# Patient Record
Sex: Male | Born: 1950 | Race: Black or African American | Hispanic: No | Marital: Single | Smoking: Former smoker
Health system: Southern US, Community
[De-identification: ages and names within clinical notes are randomized; demographics above are authoritative.]

## PROBLEM LIST (undated history)

## (undated) DIAGNOSIS — I2510 Atherosclerotic heart disease of native coronary artery without angina pectoris: Secondary | ICD-10-CM

## (undated) DIAGNOSIS — C801 Malignant (primary) neoplasm, unspecified: Secondary | ICD-10-CM

## (undated) DIAGNOSIS — I714 Abdominal aortic aneurysm, without rupture, unspecified: Secondary | ICD-10-CM

## (undated) DIAGNOSIS — I350 Nonrheumatic aortic (valve) stenosis: Secondary | ICD-10-CM

## (undated) DIAGNOSIS — Z89619 Acquired absence of unspecified leg above knee: Secondary | ICD-10-CM

## (undated) DIAGNOSIS — M1990 Unspecified osteoarthritis, unspecified site: Secondary | ICD-10-CM

## (undated) DIAGNOSIS — I219 Acute myocardial infarction, unspecified: Secondary | ICD-10-CM

## (undated) DIAGNOSIS — I10 Essential (primary) hypertension: Secondary | ICD-10-CM

## (undated) DIAGNOSIS — E785 Hyperlipidemia, unspecified: Secondary | ICD-10-CM

## (undated) DIAGNOSIS — R011 Cardiac murmur, unspecified: Secondary | ICD-10-CM

## (undated) HISTORY — DX: Atherosclerotic heart disease of native coronary artery without angina pectoris: I25.10

## (undated) HISTORY — PX: LEG SURGERY: SHX1003

## (undated) HISTORY — PX: COLONOSCOPY: SHX174

## (undated) HISTORY — PX: CORONARY ANGIOPLASTY WITH STENT PLACEMENT: SHX49

## (undated) HISTORY — DX: Hyperlipidemia, unspecified: E78.5

---

## 1970-01-17 DIAGNOSIS — Z89619 Acquired absence of unspecified leg above knee: Secondary | ICD-10-CM

## 1970-01-17 HISTORY — PX: ABOVE KNEE LEG AMPUTATION: SUR20

## 1970-01-17 HISTORY — DX: Acquired absence of unspecified leg above knee: Z89.619

## 2003-01-13 ENCOUNTER — Emergency Department (HOSPITAL_COMMUNITY): Admission: EM | Admit: 2003-01-13 | Discharge: 2003-01-13 | Payer: Self-pay | Admitting: Emergency Medicine

## 2003-01-18 HISTORY — PX: COLONOSCOPY: SHX174

## 2003-04-29 ENCOUNTER — Ambulatory Visit (HOSPITAL_COMMUNITY): Admission: RE | Admit: 2003-04-29 | Discharge: 2003-04-29 | Payer: Self-pay | Admitting: Internal Medicine

## 2005-07-16 ENCOUNTER — Emergency Department (HOSPITAL_COMMUNITY): Admission: EM | Admit: 2005-07-16 | Discharge: 2005-07-16 | Payer: Self-pay | Admitting: Emergency Medicine

## 2005-07-28 ENCOUNTER — Inpatient Hospital Stay (HOSPITAL_COMMUNITY): Admission: EM | Admit: 2005-07-28 | Discharge: 2005-07-29 | Payer: Self-pay | Admitting: Emergency Medicine

## 2005-07-29 ENCOUNTER — Inpatient Hospital Stay (HOSPITAL_COMMUNITY): Admission: EM | Admit: 2005-07-29 | Discharge: 2005-07-31 | Payer: Self-pay | Admitting: Emergency Medicine

## 2006-02-16 ENCOUNTER — Emergency Department (HOSPITAL_COMMUNITY): Admission: EM | Admit: 2006-02-16 | Discharge: 2006-02-16 | Payer: Self-pay | Admitting: Emergency Medicine

## 2006-04-29 ENCOUNTER — Inpatient Hospital Stay (HOSPITAL_COMMUNITY): Admission: AD | Admit: 2006-04-29 | Discharge: 2006-05-02 | Payer: Self-pay | Admitting: Cardiology

## 2006-04-29 ENCOUNTER — Ambulatory Visit: Payer: Self-pay | Admitting: Cardiology

## 2006-04-29 ENCOUNTER — Encounter: Payer: Self-pay | Admitting: Emergency Medicine

## 2006-04-30 ENCOUNTER — Encounter: Payer: Self-pay | Admitting: Cardiology

## 2006-05-23 ENCOUNTER — Ambulatory Visit: Payer: Self-pay | Admitting: Cardiovascular Disease

## 2006-05-25 ENCOUNTER — Encounter (HOSPITAL_COMMUNITY): Admission: RE | Admit: 2006-05-25 | Discharge: 2006-06-24 | Payer: Self-pay | Admitting: Cardiovascular Disease

## 2006-06-26 ENCOUNTER — Encounter (HOSPITAL_COMMUNITY): Admission: RE | Admit: 2006-06-26 | Discharge: 2006-07-26 | Payer: Self-pay | Admitting: Cardiovascular Disease

## 2006-07-18 ENCOUNTER — Ambulatory Visit: Payer: Self-pay | Admitting: Cardiovascular Disease

## 2006-07-28 ENCOUNTER — Encounter (HOSPITAL_COMMUNITY): Admission: RE | Admit: 2006-07-28 | Discharge: 2006-08-27 | Payer: Self-pay | Admitting: Cardiovascular Disease

## 2006-08-28 ENCOUNTER — Encounter (HOSPITAL_COMMUNITY): Admission: RE | Admit: 2006-08-28 | Discharge: 2006-09-27 | Payer: Self-pay | Admitting: Cardiovascular Disease

## 2006-11-28 ENCOUNTER — Emergency Department (HOSPITAL_COMMUNITY): Admission: EM | Admit: 2006-11-28 | Discharge: 2006-11-28 | Payer: Self-pay | Admitting: Emergency Medicine

## 2007-07-27 ENCOUNTER — Emergency Department (HOSPITAL_COMMUNITY): Admission: EM | Admit: 2007-07-27 | Discharge: 2007-07-27 | Payer: Self-pay | Admitting: Emergency Medicine

## 2008-04-29 ENCOUNTER — Encounter (INDEPENDENT_AMBULATORY_CARE_PROVIDER_SITE_OTHER): Payer: Self-pay | Admitting: *Deleted

## 2008-08-11 ENCOUNTER — Encounter: Payer: Self-pay | Admitting: Cardiovascular Disease

## 2008-09-30 ENCOUNTER — Encounter: Payer: Self-pay | Admitting: Cardiology

## 2008-10-17 ENCOUNTER — Encounter: Payer: Self-pay | Admitting: Cardiovascular Disease

## 2008-10-21 DIAGNOSIS — E119 Type 2 diabetes mellitus without complications: Secondary | ICD-10-CM | POA: Insufficient documentation

## 2008-10-24 ENCOUNTER — Ambulatory Visit: Payer: Self-pay | Admitting: Cardiovascular Disease

## 2008-10-24 ENCOUNTER — Encounter (INDEPENDENT_AMBULATORY_CARE_PROVIDER_SITE_OTHER): Payer: Self-pay | Admitting: *Deleted

## 2008-10-24 DIAGNOSIS — E785 Hyperlipidemia, unspecified: Secondary | ICD-10-CM | POA: Insufficient documentation

## 2008-10-24 DIAGNOSIS — E782 Mixed hyperlipidemia: Secondary | ICD-10-CM | POA: Insufficient documentation

## 2008-10-24 DIAGNOSIS — I2510 Atherosclerotic heart disease of native coronary artery without angina pectoris: Secondary | ICD-10-CM | POA: Insufficient documentation

## 2008-10-24 DIAGNOSIS — R0989 Other specified symptoms and signs involving the circulatory and respiratory systems: Secondary | ICD-10-CM | POA: Insufficient documentation

## 2008-11-25 ENCOUNTER — Ambulatory Visit: Payer: Self-pay | Admitting: Cardiology

## 2008-11-25 ENCOUNTER — Encounter (HOSPITAL_COMMUNITY): Admission: RE | Admit: 2008-11-25 | Discharge: 2008-12-25 | Payer: Self-pay | Admitting: Cardiovascular Disease

## 2008-11-26 ENCOUNTER — Ambulatory Visit (HOSPITAL_COMMUNITY): Admission: RE | Admit: 2008-11-26 | Discharge: 2008-11-26 | Payer: Self-pay | Admitting: Cardiovascular Disease

## 2009-02-03 ENCOUNTER — Encounter (INDEPENDENT_AMBULATORY_CARE_PROVIDER_SITE_OTHER): Payer: Self-pay | Admitting: *Deleted

## 2009-02-03 LAB — CONVERTED CEMR LAB
ALT: 13 units/L
AST: 15 units/L
Bacteria, UA: NONE SEEN
CO2: 21 meq/L
Calcium: 9 mg/dL
Casts: NEGATIVE /lpf
Chloride: 107 meq/L
Glucose, Bld: 95 mg/dL
MCV: 90.2 fL
Nitrite: NEGATIVE
Protein, ur: NEGATIVE mg/dL
RBC / HPF: NONE SEEN
Sodium: 141 meq/L
Total Protein: 6.8 g/dL
Urine Glucose: NEGATIVE mg/dL
WBC number, urine, microscopy: NEGATIVE /hpf
WBC, UA: NONE SEEN cells/hpf
pH: 6

## 2009-11-02 ENCOUNTER — Ambulatory Visit: Payer: Self-pay | Admitting: Cardiology

## 2009-11-03 ENCOUNTER — Encounter: Payer: Self-pay | Admitting: Adult Health

## 2010-02-16 NOTE — Assessment & Plan Note (Signed)
Summary: F1Y      Allergies Added: NKDA  Visit Type:  Follow-up Primary [name]:  Forest Becker  CC:  no cardiology complaints.  History of Present Illness: Mr. [name] is a pleasant 60 y/o AAM we are seeing on annual follow-up with known history of CAD with NSTEMI in 2008.  Subsequent cardiac catherization revealed 80% mid Cx stenosis and diffuse non-obstructive disease of the RCA.  He had PCI of the Cx artery using a BMS at that time. He has had a subsequent myoview stress test since that time for recurrent chest discomfort which revealed no significant stress induced abnormalities, no convincing evidence of ischemia or infarction.  Also on last vist with Dr. Eden Emms carotid bruits were auscultated and a carotid study was ordered.  The demonstrated minimal plaque at carotid bifurcations bilaterally with minimal associated turbulent flow in the ICA bilaterally.  Since being seen last one year ago he denies any significant chest pain. He says he has had to take NTG SL every three months at the most. Chest discomfort is associated with cutting wood, carrying heavy load or heavy exertion.  He remains active hunting and working outside.  He says he slows down and takes longer to do tasks that took his less time before his heart attack but he has learned not to overdo.  Dr. Ouida Sills is following cholesterol status and he sees him this week for follow-up appointment and lab work.  Current Medications (verified): 1)  Plavix 75 Mg Tabs (Clopidogrel Bisulfate) .... Take One Tablet By Mouth Daily 2)  Nitrolingual 0.4 Mg/spray Soln (Nitroglycerin) .... One Spray Under Tongue Every 5 Minutes As Needed For Chest Pain---May Repeat Times Three 3)  Metoprolol Tartrate 25 Mg Tabs (Metoprolol Tartrate) .... Take 1/2 Tab Two Times A Day 4)  Zocor 40 Mg Tabs (Simvastatin) .... Take 1 Tab Daily 5)  Aspir-Low 81 Mg Tbec (Aspirin) .... Take 1 Tab Daily  Allergies (verified): No Known Drug  Allergies  Comments:  Nurse/Medical Assistant: patient didn't bring list or meds reviewed previous ov med list  Review of Systems       Only with hard exertion.  All other systems have been reviewed and are negative unless stated above.   Vital Signs:  Patient profile:   60 year old male Weight:      153 pounds BMI:     22.68 Pulse rate:   69 / minute BP sitting:   118 / 61  (right arm)  Vitals Entered By: Dreama Saa, CNA (November 02, 2009 2:29 PM)  Physical Exam  General:  Well developed, well nourished, in no acute distress. Lungs:  Clear bilaterally to auscultation and percussion. Heart:  Non-displaced PMI, chest non-tender; regular rate and rhythm, S1, S2 without murmurs, rubs or gallops. Carotid upstroke normal, no bruit. Normal abdominal aortic size, no bruits. Femorals normal pulses, no bruits. Pedals normal pulses. No edema, no varicosities. Abdomen:  Bowel sounds positive; abdomen soft and non-tender without masses, organomegaly, or hernias noted. No hepatosplenomegaly. Msk:  Back normal, normal gait. Muscle strength and tone normal. Pulses:  pulses normal in all 4 extremities Extremities:  No clubbing or cyanosis. Neurologic:  Alert and oriented x 3. Psych:  Normal affect.   EKG  Procedure date:  11/02/2009  Findings:      Normal sinus rhythm with rate of:  63 bpm  Impression & Recommendations:  Problem # 1:  CAD (ICD-414.00) He continues to be active and only takes NTG every 3 months.  He  is advised to keep a fresh bottle with him and refill every 3 months.  His angina equivalent is severe nausea and chest pressure.  He has not had any symptoms of that.  Most recent stress test is reassuring.  He will follow-up in one year. If he has problems or recurrent symptoms he will see [place] sooner.  Repeat stress test at Dr. Fabio Bering discretion on next visit. Whether to continue Plavix should be addressed on that visit.  He has a BMS but has had some chest discomfort on  and off. His updated medication list for this problem includes:    Plavix 75 Mg Tabs (Clopidogrel bisulfate) .[name]... Take one tablet by mouth daily    Nitrolingual 0.4 Mg/spray Soln (Nitroglycerin) ..... One spray under tongue every 5 minutes as needed for chest pain---may repeat times three    Metoprolol Tartrate 25 Mg Tabs (Metoprolol tartrate) .[name]... Take 1/2 tab two times a day    Aspir-low 81 Mg Tbec (Aspirin) .[name]... Take 1 tab daily  Problem # 2:  DYSLIPIDEMIA (ICD-272.4) He is followed by Dr. Ouida Sills for this. Office visit is pending and labs are to be drawn.  Recommend LDL 70, TC 200 or less. His updated medication list for this problem includes:    Zocor 40 Mg Tabs (Simvastatin) .[name]... Take 1 tab daily  Patient Instructions: 1)  Your physician recommends that you schedule a follow-up appointment in: 1 year 2)  Your physician recommends that you continue on your current medications as directed. Please refer to the Current Medication list given to you today.

## 2010-02-16 NOTE — Miscellaneous (Signed)
Summary: cbc,cmp,lipids,ua,hgba1c  Clinical Lists Changes  Observations: Added new observation of BACTERIA URN: none seen (02/03/2009 12:50) Added new observation of RBCS MICRO U: none seen (02/03/2009 12:50) Added new observation of WBCS MICRO U: none seen (02/03/2009 12:50) Added new observation of CASTS URINE: neg (02/03/2009 12:50) Added new observation of WBC UR: neg (02/03/2009 12:50) Added new observation of NITRITE URN: neg (02/03/2009 12:50) Added new observation of PROTEIN UR: neg (02/03/2009 12:50) Added new observation of BLOOD UR: neg (02/03/2009 12:50) Added new observation of GLUCOSE UA: neg (02/03/2009 12:50) Added new observation of PH URINE: 6.0  (02/03/2009 12:50) Added new observation of SPEC GR URIN: 1.009  (02/03/2009 12:50) Added new observation of CALCIUM: 9.0 mg/dL (64/40/3474 25:95) Added new observation of ALBUMIN: 4.2 g/dL (63/87/5643 32:95) Added new observation of PROTEIN, TOT: 6.8 g/dL (18/84/1660 63:01) Added new observation of SGPT (ALT): 13 units/L (02/03/2009 12:50) Added new observation of SGOT (AST): 15 units/L (02/03/2009 12:50) Added new observation of ALK PHOS: 77 units/L (02/03/2009 12:50) Added new observation of BILI DIRECT: total bili  0.2 mg/dL (60/10/9321 55:73) Added new observation of CREATININE: 0.90 mg/dL (22/02/5425 06:23) Added new observation of BUN: 17 mg/dL (76/28/3151 76:16) Added new observation of BG RANDOM: 95 mg/dL (07/37/1062 69:48) Added new observation of CO2 PLSM/SER: 21 meq/L (02/03/2009 12:50) Added new observation of CL SERUM: 107 meq/L (02/03/2009 12:50) Added new observation of K SERUM: 4.3 meq/L (02/03/2009 12:50) Added new observation of NA: 141 meq/L (02/03/2009 12:50) Added new observation of LDL: 67 mg/dL (54/62/7035 00:93) Added new observation of HDL: 32 mg/dL (81/82/9937 16:96) Added new observation of TRIGLYC TOT: 88 mg/dL (78/93/8101 75:10) Added new observation of CHOLESTEROL: 117 mg/dL (25/85/2778  24:23) Added new observation of PLATELETK/UL: 319 K/uL (02/03/2009 12:50) Added new observation of MCV: 90.2 fL (02/03/2009 12:50) Added new observation of HCT: 44.1 % (02/03/2009 12:50) Added new observation of HGB: 15.0 g/dL (53/61/4431 54:00) Added new observation of WBC COUNT: 12.2 10*3/microliter (02/03/2009 12:50) Added new observation of HGBA1C: 6.0 % (02/03/2009 12:50)

## 2010-03-15 ENCOUNTER — Encounter: Payer: Self-pay | Admitting: Cardiovascular Disease
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
## 2010-06-04 NOTE — Op Note (Signed)
NAME:  [name], [name]                        ACCOUNT NO.:  [id number]   MEDICAL RECORD NO.:  [id number]                   PATIENT TYPE:  AMB   LOCATION:  DAY                                  FACILITY:  [facility]   PHYSICIAN:  R. Roetta Sessions, M.D.              DATE OF BIRTH:  1950-10-06   DATE OF PROCEDURE:  04/29/2003  DATE OF DISCHARGE:                                 OPERATIVE REPORT   PROCEDURE:  Colonoscopy with snare polypectomy.   INDICATIONS:  The patient is a 60 year old gentleman sent over at the  courtesy of Dr. Carylon Perches for colorectal cancer screening.  He is devoid of  any lower GI tract symptoms.  He has never had his colon imaged previously  and there is no family history of colorectal neoplasia.  Colonoscopy is now  being done as a standard screening maneuver.  This approach has been  discussed with the patient at length.  The potential risks, benefits and  alternatives have been reviewed and questions answered.  Please see  documentation in the medical record.   DESCRIPTION OF PROCEDURE:  Oxygen saturation, blood pressure, pulse and  respiration were monitored throughout the entire procedure.  Conscious  sedation with Versed 4 mg IV and Demerol 75 mg IV in divided doses.  The  instrument was the Olympus video tip adult colonoscope.   FINDINGS:  Digital rectal exam initially revealed no abnormalities.   ENDOSCOPIC FINDINGS:  Prep was adequate.  Rectum:  Examination of the rectal mucosa including retroflexion in the anal  verge revealed no abnormalities.  Colon:  Colonic mucosa was surveyed from the rectosigmoid junction to the  left, transverse,  right colon to the area of the appendiceal orifice,  ileocecal valve and cecum.  These structures were well seen and photographed  for the record.  From this level the scope was slowly withdrawn.  All  previously mentioned mucosal surfaces were again seen.  The patient was  noted to have a 5 mm pedunculated polyp at 20 cm  (rectosigmoid) which was  cold snared and recovered, scattered left-sided diverticula.  The remainder  of the colonic mucosa appeared normal.  The patient tolerated the procedure  well and was reactive after endoscopy.   IMPRESSION:  1. Normal rectum.  2. Small pedunculated polyp at 20 cm, cold snare removed.  3. Scattered left-sided diverticula.  4. The remainder of the colonic mucosa appeared normal.   RECOMMENDATIONS:  1. Follow up in pathology.  2. Further recommendations to follow.      ___________________________________________                                            Jonathon Bellows, M.D.   RMR/MEDQ  D:  04/29/2003  T:  04/29/2003  Job:  [id number]   cc:  Kingsley Callander. Ouida Sills, M.D.  [address]  [place]  [place] [address]  Fax: [phone]
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
## 2010-06-04 NOTE — Discharge Summary (Signed)
NAMEADD, DINAPOLI              ACCOUNT NO.:  [id number]   MEDICAL RECORD NO.:  [id number]          PATIENT TYPE:  INP   LOCATION:  A304                          FACILITY:  [facility]   PHYSICIAN:  Kingsley Callander. Ouida Sills, MD       DATE OF BIRTH:  04-22-1950   DATE OF ADMISSION:  07/29/2005  DATE OF DISCHARGE:  07/15/2007LH                                 DISCHARGE SUMMARY   DISCHARGE DIAGNOSES:  1. Gastroenteritis.  2. Diabetes.   HOSPITAL COURSE:  This patient is a 60 year old male who was readmitted on  the same day of a discharge after he developed recurrent abdominal pain and  vomiting.  He had been admitted for one night and had been treated with IV  fluids and anti-emetics.  His symptoms had resolved.  He had been able to  eat.  However, upon returning home he developed recurrent symptoms and was  therefore rehospitalized.  His white count was 12.6 with 75 segs and 15  lymphs.  He was treated with IV fluids, Zofran, and Protonix.  Stool studies  were negative.  LFTs were normal.  His repeat white count was 14.5.  Abdominal pain was treated with morphine.   He was again able to tolerate clear liquids, then a bland diet.  His  symptoms resolved and he was stable for discharge on July 15.   He had had a CT on July 12 revealing possible thickening of the wall of the  transverse colon.  This was felt to likely represent under distension,  however.  A previous colonoscopy had revealed no tumor in this region.  The  question of Clostridium difficile colitis was raised by the radiologist;  however, his Clostridium difficile toxin was negative.   His diabetes was well-controlled with glucoses ranging from 107-150.   DISCHARGE MEDICATIONS:  1. Vytorin 10/20 daily.  2. Aspirin 81 mg daily.      Kingsley Callander. Ouida Sills, MD  Electronically Signed     ROF/MEDQ  D:  08/09/2005  T:  08/09/2005  Job:  [id number]

## 2010-06-04 NOTE — Discharge Summary (Signed)
[name], [name]              ACCOUNT NO.:  [id number]   MEDICAL RECORD NO.:  [id number]          PATIENT TYPE:  INP   LOCATION:  A304                          FACILITY:  [facility]   PHYSICIAN:  Kingsley Callander. Ouida Sills, MD       DATE OF BIRTH:  1950/03/31   DATE OF ADMISSION:  07/28/2005  DATE OF DISCHARGE:  07/13/2007LH                                 DISCHARGE SUMMARY   DISCHARGE DIAGNOSES:  1. Gastroenteritis.  2. Diabetes.   HOSPITAL COURSE:  This patient is a 60 year old male who presented to the  emergency room with nausea, vomiting, diarrhea, and abdominal pain.  He was  treated with Zofran, Dilaudid, and IV fluids.  He underwent a CT scan of the  abdomen which revealed thickening of the wall of the transverse colon.  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  His white count was 15.5 with 93 segs and 5 lymphs.   A stool for white cells was negative.  His stool culture has been  preliminarily negative.  His Clostridium difficile toxin was negative.  He  improved substantially overnight.  He was advised to full liquid diet which  he tolerated well.  He was then able to eat a soft diet without any  problems.  He felt complete relief of his symptoms the next day and was felt  to be stable for discharge.   He has had colonoscopy within the past two years.  He will have follow-up of  the transverse colon thickening as necessary.   DISCHARGE MEDICATIONS:  1. Vytorin 10/20 daily.  2. Aspirin 81 mg daily.   FOLLOW-UP:  He will be seen in my office in one to two weeks.  He was  advised to stop smoking.      Kingsley Callander. Ouida Sills, MD  Electronically Signed     ROF/MEDQ  D:  08/09/2005  T:  08/09/2005  Job:  [id number]

## 2010-06-04 NOTE — Cardiovascular Report (Signed)
NAMEATHENS, LEBEAU NO.:  [id number]   MEDICAL RECORD NO.:  [id number]          PATIENT TYPE:  INP   LOCATION:  2922                         FACILITY:  [facility]   PHYSICIAN:  Veverly Fells. Excell Seltzer, MD  DATE OF BIRTH:  12-14-1950   DATE OF PROCEDURE:  05/01/2006  DATE OF DISCHARGE:                            CARDIAC CATHETERIZATION   PROCEDURE:  Left heart catheterization, selective coronary angiography,  left ventricular angiography, stent to the left circumflex and Star  close to the right femoral artery.   INDICATIONS:  Mr. Nodal is a 60 year old gentleman  who was out  hunting over the weekend.  He developed substernal chest pain.  He has  no prior history of cardiac disease.  He ruled in for myocardial  infarction with a troponin greater than 30.  He was referred for cardiac  catheterization in the setting of his clear-cut non-ST-elevation MI.   Risks and indications of procedure were explained to the patient and  formed consent was obtained.  Using modified the Seldinger technique a 6-  French sheath was placed in the right femoral artery.  Multiple views of  the left and right coronary arteries were taken using standard Judkins  preformed catheters.  Following selective coronary angiography an angled  pigtail catheter was inserted into the left ventricle and pressures were  recorded in [name] and in LAO.  Left ventriculogram were performed.  A  pullback across the aortic valve was done.   After the diagnostic portion of the procedure, I  elected to intervene  on a high-grade stenosis in the mid left circumflex.  There was an area  of stenosis of approximately 80%.  It gave off an appearance that there  was potentially a marginal branch arising from that area that was  occluded.  Despite that there were no wall motion abnormalities seen on  the ventriculogram.  The patient was already on Integrin.  Additional  heparin of 40 units per kg were given for  anticoagulation.  A  therapeutic ACT was achieved at 258.  Once the ACT was therapeutic, a  Cougar guidewire was inserted across the lesion.  A 6-French [id number] guide  catheter was. I made multiple attempts at passing a wire into what I  thought with an occluded marginal branch but the wire would not pass.  There were no collaterals seen to that area.  I then elected to wire the  true circumflex across the area of tight stenosis.  A 2.5 x 14 mm driver  stent was deployed at 10 atmospheres.  Following stent deployment there  was an excellent angiographic result with good stent expansion and TIMI  III flow.  The stent was then postdilated with a 2.75 x 12-mm  noncompliant balloon up to 16 atmospheres on two serial inflations.  At  the conclusion of the interventional procedure there was TIMI III flow  throughout the circumflex system.  There is a well expanded stent and an  excellent angiographic result.   At the conclusion of the case, a 6-French Star close was used to seal  the femoral arteriotomy.   FINDINGS:  Aortic pressure 138/65 with a mean of 92, left ventricular  pressure 145/1 with an end-diastolic pressure of 20.   Coronary angiography.  The left mainstem has minor luminal  irregularities.  It trifurcates into the LAD, intermediate branch and  left circumflex.   The LAD is a large-caliber vessel that courses down to the left  ventricular apex.  There are minor luminal irregularities throughout the  proximal and midportions of the LAD.  There is a large first diagonal  branch present that has no significant angiographic disease.  The distal  LAD does not have any significant stenoses.   The ramus intermedius branch is a medium size vessel.  It has minor  luminal irregularities but no significant angiographic disease.   Left circumflex is a medium caliber vessel.  It gives off two small  marginal branches and then has an area of irregular 80% stenosis.  There  is an appearance  that there could be an occluded marginal branch coming  out of this area but it is not clear.  The distal vessel is fairly  small.   The right coronary artery is diffusely diseased.  There is  nonobstructive luminal irregularities in the proximal portion.  The  midportion of the right coronary artery has a focal 40% stenosis.  The  distal vessel has diffuse luminal irregularities at most of  approximately 25% stenosis at the bifurcation of the PDA and posterior  AV segment.  The PDA branch has no significant disease.  The posterior  AV segment has minor luminal irregularities and gives off two  posterolateral branches.   Left ventriculography was performed in the 30 degree [name] as well as the  30 degree LAO projections.  There are no focal wall motion abnormalities  present.  The estimated left ventricular ejection fraction 65%.  There  is no mitral regurgitation.   ASSESSMENT:  1. Single-vessel coronary artery disease with high-grade stenosis of      the mid left circumflex.  2. Nonobstructive LAD and right coronary artery stenoses.  3. Normal left ventricular systolic function.   PLAN:  As detailed above PCI of the left circumflex was performed and a  2.5x14 mm driver stent was placed.  There is an excellent angiographic  result.  The patient tolerated the procedure well.  He should continue  on aspirin and clopidogrel for a minimum of 1 month and preferably for 1  year in the setting of his some myocardial infarction.      Veverly Fells. Excell Seltzer, MD  Electronically Signed     MDC/MEDQ  D:  05/01/2006  T:  05/01/2006  Job:  [id number]   cc:   Kingsley Callander. Ouida Sills, MD

## 2010-10-14 LAB — DIFFERENTIAL
Basophils Absolute: 0.1
Eosinophils Absolute: 0.1
Eosinophils Relative: 1
Lymphocytes Relative: 20
Lymphs Abs: 2
Monocytes Absolute: 0.5
Neutrophils Relative %: 73

## 2010-10-14 LAB — CBC
HCT: 44.5
Hemoglobin: 14.6
MCHC: 32.8
MCV: 89.3
Platelets: 322
RDW: 15.6 — ABNORMAL HIGH

## 2010-10-14 LAB — POCT CARDIAC MARKERS
CKMB, poc: 1.7
CKMB, poc: 2.3
Myoglobin, poc: 76.5
Myoglobin, poc: 79
Troponin i, poc: <0.05
Troponin i, poc: <0.05

## 2010-10-14 LAB — COMPREHENSIVE METABOLIC PANEL
Alkaline Phosphatase: 64
Creatinine, Ser: 0.92
Glucose, Bld: 136 — ABNORMAL HIGH
Sodium: 139

## 2010-11-04 ENCOUNTER — Encounter: Payer: Self-pay | Admitting: Adult Health

## 2010-11-12 ENCOUNTER — Encounter: Payer: Self-pay | Admitting: Adult Health

## 2010-11-18 ENCOUNTER — Encounter: Payer: Self-pay | Admitting: Adult Health

## 2010-11-22 ENCOUNTER — Other Ambulatory Visit: Payer: Self-pay | Admitting: Cardiovascular Disease

## 2010-11-22 ENCOUNTER — Ambulatory Visit (INDEPENDENT_AMBULATORY_CARE_PROVIDER_SITE_OTHER): Payer: Medicare Other | Admitting: Adult Health

## 2010-11-22 DIAGNOSIS — I359 Nonrheumatic aortic valve disorder, unspecified: Secondary | ICD-10-CM

## 2010-11-22 DIAGNOSIS — I2510 Atherosclerotic heart disease of native coronary artery without angina pectoris: Secondary | ICD-10-CM

## 2010-11-22 DIAGNOSIS — E785 Hyperlipidemia, unspecified: Secondary | ICD-10-CM

## 2010-11-22 DIAGNOSIS — R011 Cardiac murmur, unspecified: Secondary | ICD-10-CM

## 2010-11-22 MED ORDER — CLOPIDOGREL BISULFATE 75 MG PO TABS: 75.0000 mg | ORAL_TABLET | Freq: Every day | ORAL | Status: DC

## 2010-11-22 MED ORDER — METOPROLOL TARTRATE 25 MG PO TABS: 12.5000 mg | ORAL_TABLET | Freq: Two times a day (BID) | ORAL | Status: DC

## 2010-11-22 NOTE — Assessment & Plan Note (Signed)
He is followed by Dr. Ouida Sills for cholesterol status and treatment.

## 2010-11-22 NOTE — Progress Notes (Signed)
HPI: Mr. [name] is a friendly 60 y/o patient of Dr. Eden Emms we are seeing on annual follow-up with known history of CAD.  He has a history of NSTEMI  in 2008, subsequent cardiac catheterization at that time demonstrated,  Single-vessel coronary artery disease with high-grade stenosis of  the mid left circumflex. Nonobstructive LAD and right coronary artery stenoses.Normal left ventricular systolic function.  PCI of the left circumflex was performed and a 2.5x14 mm driver stent was placed.   Since that time he has been doing well with complaints.  He continues to be medically compliant. Has used NTG rarely. He cuts wood, walks several miles with his "rabbit dogs" and has no angina.  He had carotid studies completed when seen last by Dr. Eden Emms in 2010 demonstrating minimal plaque at carotid bifurcations bilaterally with miniall associated turbulent flow in the ICA bilaterally.     No Known Allergies  Current Outpatient Prescriptions  Medication Sig Dispense Refill  . aspirin 81 MG tablet Take 81 mg by mouth daily.        . clopidogrel (PLAVIX) 75 MG tablet Take 1 tablet (75 mg total) by mouth daily.  30 tablet  6  . metoprolol tartrate (LOPRESSOR) 25 MG tablet Take 0.5 tablets (12.5 mg total) by mouth 2 (two) times daily.  60 tablet  6  . nitroGLYCERIN (NITROLINGUAL) 0.4 MG/SPRAY spray Place 1 spray under the tongue every 5 (five) minutes as needed. May repeat for up to 3 doses       . simvastatin (ZOCOR) 40 MG tablet Take 40 mg by mouth at bedtime.          Past Medical History  Diagnosis Date  . Diabetes mellitus     Past Surgical History  Procedure Date  . Leg surgery     Repair of left leg trauma    WUJ:WJXBJY of systems complete and found to be negative unless listed above PHYSICAL EXAM BP 126/65  Pulse 69  Resp 16  Ht 5\' 9"  (1.753 m)  Wt 161 lb (73.029 kg)  BMI 23.78 kg/m2  General: Well developed, well nourished, in no acute distress Head: Eyes PERRLA, No xanthomas.    Normal cephalic and atramatic  Lungs: Clear bilaterally to auscultation and percussion. Heart: HRRR S1 S2, 2/6 systolic murmur at the LSB and apex.   Pulses are 2+ & equal.            Left carotid bruit. No JVD.  Soft abdominal bruit. No femoral bruits. Abdomen: Bowel sounds are positive, abdomen soft and non-tender without masses or                  Hernia's noted. Msk:  Back normal, normal gait. Normal strength and tone for age. Extremities: No clubbing, cyanosis or edema.  DP +1 Neuro: Alert and oriented X 3. Psych:  Good affect, responds appropriately  EKG: NSR possible left atrial enlargement. Inferior T-wave abnormality.  ASSESSMENT AND PLAN

## 2010-11-22 NOTE — Assessment & Plan Note (Signed)
He is doing well symptomatically.  He remains active and has had rare use of NTG.  Will continue current medications. He will remain on Plavix until I evaluate his systolic murmur.  He has a 2/6 murmur at the LSB and apex.  Last Echo: April 2008.  Overall left ventricular systolic function was normal. Left ventricular ejection fraction was estimated , range being  55% to 65 %. There were no left ventricular regional wall  motion abnormalities. Left ventricular wall thickness was mildly increased. There was mild aortic valvular regurgitation. There was mild mitral valvular regurgitation.  I will have repeat of his echo.  He will follow-up with Dr. Eden Emms in March unless echo is significantly abnormal. He has not seen Dr. Eden Emms in person in 2 years.

## 2010-11-22 NOTE — Patient Instructions (Signed)
Your physician has requested that you have an echocardiogram. Echocardiography is a painless test that uses sound waves to create images of your heart. It provides your doctor with information about the size and shape of your heart and how well your heart's chambers and valves are working. This procedure takes approximately one hour. There are no restrictions for this procedure.  Your physician recommends that you continue on your current medications as directed. Please refer to the Current Medication list given to you today.  Your physician recommends that you schedule a follow-up appointment in: March

## 2010-11-29 ENCOUNTER — Ambulatory Visit (HOSPITAL_COMMUNITY)
Admission: RE | Admit: 2010-11-29 | Discharge: 2010-11-29 | Disposition: A | Discharge status: Home / Self Care | Payer: Medicare Other | Source: Ambulatory Visit | Attending: Adult Health | Admitting: Adult Health

## 2010-11-29 DIAGNOSIS — I359 Nonrheumatic aortic valve disorder, unspecified: Secondary | ICD-10-CM

## 2010-11-29 DIAGNOSIS — E119 Type 2 diabetes mellitus without complications: Secondary | ICD-10-CM | POA: Insufficient documentation

## 2010-11-29 DIAGNOSIS — R011 Cardiac murmur, unspecified: Secondary | ICD-10-CM | POA: Insufficient documentation

## 2010-11-29 DIAGNOSIS — E785 Hyperlipidemia, unspecified: Secondary | ICD-10-CM | POA: Insufficient documentation

## 2010-11-29 NOTE — Progress Notes (Signed)
*  PRELIMINARY RESULTS* Echocardiogram 2D Echocardiogram has been performed.  Conrad Standish 11/29/2010, 10:23 AM

## 2011-02-18 ENCOUNTER — Other Ambulatory Visit: Payer: Self-pay | Admitting: Cardiovascular Disease

## 2011-03-07 DIAGNOSIS — Z125 Encounter for screening for malignant neoplasm of prostate: Secondary | ICD-10-CM | POA: Diagnosis not present

## 2011-03-07 DIAGNOSIS — Z79899 Other long term (current) drug therapy: Secondary | ICD-10-CM | POA: Diagnosis not present

## 2011-03-07 DIAGNOSIS — E119 Type 2 diabetes mellitus without complications: Secondary | ICD-10-CM | POA: Diagnosis not present

## 2011-03-07 DIAGNOSIS — I2510 Atherosclerotic heart disease of native coronary artery without angina pectoris: Secondary | ICD-10-CM | POA: Diagnosis not present

## 2011-03-14 DIAGNOSIS — E785 Hyperlipidemia, unspecified: Secondary | ICD-10-CM | POA: Diagnosis not present

## 2011-03-14 DIAGNOSIS — E119 Type 2 diabetes mellitus without complications: Secondary | ICD-10-CM | POA: Diagnosis not present

## 2011-03-14 DIAGNOSIS — I2510 Atherosclerotic heart disease of native coronary artery without angina pectoris: Secondary | ICD-10-CM | POA: Diagnosis not present

## 2011-03-23 ENCOUNTER — Encounter: Payer: Self-pay | Admitting: Cardiovascular Disease

## 2011-03-23 ENCOUNTER — Ambulatory Visit (INDEPENDENT_AMBULATORY_CARE_PROVIDER_SITE_OTHER): Payer: Medicare Other | Admitting: Cardiovascular Disease

## 2011-03-23 DIAGNOSIS — I2510 Atherosclerotic heart disease of native coronary artery without angina pectoris: Secondary | ICD-10-CM

## 2011-03-23 DIAGNOSIS — E785 Hyperlipidemia, unspecified: Secondary | ICD-10-CM

## 2011-03-23 DIAGNOSIS — I359 Nonrheumatic aortic valve disorder, unspecified: Secondary | ICD-10-CM

## 2011-03-23 DIAGNOSIS — R011 Cardiac murmur, unspecified: Secondary | ICD-10-CM

## 2011-03-23 DIAGNOSIS — R0989 Other specified symptoms and signs involving the circulatory and respiratory systems: Secondary | ICD-10-CM

## 2011-03-23 DIAGNOSIS — I351 Nonrheumatic aortic (valve) insufficiency: Secondary | ICD-10-CM

## 2011-03-23 NOTE — Progress Notes (Signed)
Patient ID: [name], male   DOB: 12/22/1950, 61 y.o.   MRN: [id number] Mr. [name] is a friendly 61 y/o patient with known history of CAD. He has a history of NSTEMI in 2008, subsequent cardiac catheterization at that time demonstrated, Single-vessel coronary artery disease with high-grade stenosis of the mid left circumflex. Nonobstructive LAD and right coronary artery stenoses.Normal left ventricular systolic function. PCI of the left circumflex was performed and a 2.5x14 mm driver stent was placed.   Since that time he has been doing well with complaints. He continues to be medically compliant. Has used NTG rarely. He cuts wood, walks several miles with his "rabbit dogs" and has no angina. He had carotid studies completed  in 2010 demonstrating minimal plaque at carotid bifurcations bilaterally with miniall associated turbulent flow in the ICA bilaterally.   ROS: Denies fever, malais, weight loss, blurry vision, decreased visual acuity, cough, sputum, SOB, hemoptysis, pleuritic pain, palpitaitons, heartburn, abdominal pain, melena, lower extremity edema, claudication, or rash.  All other systems reviewed and negative  General: Affect appropriate Healthy:  appears stated age HEENT: normal Neck supple with no adenopathy JVP normal bilateral  bruits no thyromegaly Lungs clear with no wheezing and good diaphragmatic motion Heart:  S1/S2  SEM AR no murmur, no rub, gallop or click PMI normal Abdomen: benighn, BS positve, no tenderness, no AAA no bruit.  No HSM or HJR Distal pulses intact with no bruits No edema Neuro non-focal Skin warm and dry No muscular weakness   Current Outpatient Prescriptions  Medication Sig Dispense Refill  . aspirin 81 MG tablet Take 81 mg by mouth daily.        . clopidogrel (PLAVIX) 75 MG tablet Take 1 tablet (75 mg total) by mouth daily.  30 tablet  6  . metoprolol tartrate (LOPRESSOR) 25 MG tablet Take 0.5 tablets (12.5 mg total) by mouth 2 (two) times  daily.  60 tablet  6  . NITROSTAT 0.4 MG SL tablet PLACE TABLET(S) UNDER THE TONGUE AS DIRECTED  25 tablet  11  . simvastatin (ZOCOR) 40 MG tablet Take 40 mg by mouth at bedtime.          Allergies  Review of patient's allergies indicates no known allergies.  Electrocardiogram:  Assessment and Plan

## 2011-03-23 NOTE — Patient Instructions (Signed)
**Note De-Identified [name] Obfuscation** Your physician has requested that you have an echocardiogram. Echocardiography is a painless test that uses sound waves to create images of your heart. It provides your doctor with information about the size and shape of your heart and how well your heart's chambers and valves are working. This procedure takes approximately one hour. There are no restrictions for this procedure.  Your physician has requested that you have a carotid duplex. This test is an ultrasound of the carotid arteries in your neck. It looks at blood flow through these arteries that supply the brain with blood. Allow one hour for this exam. There are no restrictions or special instructions.  Your physician recommends that you schedule a follow-up appointment in: 1 year

## 2011-03-23 NOTE — Assessment & Plan Note (Signed)
Has SEM and significant AR murmur  F/U echo

## 2011-03-23 NOTE — Assessment & Plan Note (Signed)
Discussed low carb diet.  Target hemoglobin A1c is 6.5 or less.  Continue current medications.  

## 2011-03-23 NOTE — Assessment & Plan Note (Signed)
F/U carotid duplex.  ASA 

## 2011-03-23 NOTE — Assessment & Plan Note (Signed)
Stable with no angina and good activity level.  Continue medical Rx  

## 2011-03-23 NOTE — Assessment & Plan Note (Signed)
Cholesterol is at goal.  Continue current dose of statin and diet Rx.  No myalgias or side effects.  F/U  LFT's in 6 months. Lab Results  Component Value Date   LDLCALC 67 02/03/2009

## 2011-03-25 ENCOUNTER — Ambulatory Visit (HOSPITAL_COMMUNITY)
Admission: RE | Admit: 2011-03-25 | Discharge: 2011-03-25 | Disposition: A | Discharge status: Home / Self Care | Payer: Medicare Other | Source: Ambulatory Visit | Attending: Cardiovascular Disease | Admitting: Cardiovascular Disease

## 2011-03-25 ENCOUNTER — Ambulatory Visit (HOSPITAL_COMMUNITY): Admission: RE | Admit: 2011-03-25 | Payer: Medicare Other | Source: Ambulatory Visit

## 2011-03-25 DIAGNOSIS — I6529 Occlusion and stenosis of unspecified carotid artery: Secondary | ICD-10-CM | POA: Diagnosis not present

## 2011-03-25 DIAGNOSIS — E119 Type 2 diabetes mellitus without complications: Secondary | ICD-10-CM | POA: Insufficient documentation

## 2011-03-25 DIAGNOSIS — R0989 Other specified symptoms and signs involving the circulatory and respiratory systems: Secondary | ICD-10-CM | POA: Insufficient documentation

## 2011-03-25 DIAGNOSIS — F17200 Nicotine dependence, unspecified, uncomplicated: Secondary | ICD-10-CM | POA: Insufficient documentation

## 2011-06-27 ENCOUNTER — Other Ambulatory Visit: Payer: Self-pay | Admitting: Adult Health

## 2011-07-11 DIAGNOSIS — E119 Type 2 diabetes mellitus without complications: Secondary | ICD-10-CM | POA: Diagnosis not present

## 2011-08-01 DIAGNOSIS — M1990 Unspecified osteoarthritis, unspecified site: Secondary | ICD-10-CM | POA: Diagnosis not present

## 2011-08-01 DIAGNOSIS — E119 Type 2 diabetes mellitus without complications: Secondary | ICD-10-CM | POA: Diagnosis not present

## 2011-08-01 DIAGNOSIS — I2510 Atherosclerotic heart disease of native coronary artery without angina pectoris: Secondary | ICD-10-CM | POA: Diagnosis not present

## 2011-11-15 ENCOUNTER — Encounter (HOSPITAL_COMMUNITY): Payer: Self-pay

## 2011-11-15 ENCOUNTER — Emergency Department (HOSPITAL_COMMUNITY)
Admission: EM | Admit: 2011-11-15 | Discharge: 2011-11-15 | Disposition: A | Discharge status: Home / Self Care | Payer: Medicare Other | Attending: Emergency Medicine | Admitting: Emergency Medicine

## 2011-11-15 ENCOUNTER — Emergency Department (HOSPITAL_COMMUNITY): Payer: Medicare Other

## 2011-11-15 DIAGNOSIS — S91009A Unspecified open wound, unspecified ankle, initial encounter: Secondary | ICD-10-CM | POA: Insufficient documentation

## 2011-11-15 DIAGNOSIS — I252 Old myocardial infarction: Secondary | ICD-10-CM | POA: Insufficient documentation

## 2011-11-15 DIAGNOSIS — S78119A Complete traumatic amputation at level between unspecified hip and knee, initial encounter: Secondary | ICD-10-CM | POA: Insufficient documentation

## 2011-11-15 DIAGNOSIS — S81009A Unspecified open wound, unspecified knee, initial encounter: Secondary | ICD-10-CM | POA: Diagnosis not present

## 2011-11-15 DIAGNOSIS — S81812A Laceration without foreign body, left lower leg, initial encounter: Secondary | ICD-10-CM

## 2011-11-15 DIAGNOSIS — S8990XA Unspecified injury of unspecified lower leg, initial encounter: Secondary | ICD-10-CM | POA: Diagnosis not present

## 2011-11-15 DIAGNOSIS — Z79899 Other long term (current) drug therapy: Secondary | ICD-10-CM | POA: Insufficient documentation

## 2011-11-15 DIAGNOSIS — Z7982 Long term (current) use of aspirin: Secondary | ICD-10-CM | POA: Insufficient documentation

## 2011-11-15 DIAGNOSIS — Y939 Activity, unspecified: Secondary | ICD-10-CM | POA: Insufficient documentation

## 2011-11-15 DIAGNOSIS — E119 Type 2 diabetes mellitus without complications: Secondary | ICD-10-CM | POA: Diagnosis not present

## 2011-11-15 DIAGNOSIS — F17200 Nicotine dependence, unspecified, uncomplicated: Secondary | ICD-10-CM | POA: Insufficient documentation

## 2011-11-15 DIAGNOSIS — Y929 Unspecified place or not applicable: Secondary | ICD-10-CM | POA: Insufficient documentation

## 2011-11-15 DIAGNOSIS — W010XXA Fall on same level from slipping, tripping and stumbling without subsequent striking against object, initial encounter: Secondary | ICD-10-CM | POA: Insufficient documentation

## 2011-11-15 DIAGNOSIS — S99929A Unspecified injury of unspecified foot, initial encounter: Secondary | ICD-10-CM | POA: Diagnosis not present

## 2011-11-15 HISTORY — DX: Acute myocardial infarction, unspecified: I21.9

## 2011-11-15 HISTORY — DX: Acquired absence of unspecified leg above knee: Z89.619

## 2011-11-15 MED ORDER — NITROGLYCERIN 2 % TD OINT
TOPICAL_OINTMENT | TRANSDERMAL | Status: AC
  Filled 2011-11-15: qty 1

## 2011-11-15 MED ORDER — IBUPROFEN 800 MG PO TABS
800.0000 mg | ORAL_TABLET | Freq: Once | ORAL | Status: AC
  Administered 2011-11-15: 800 mg via ORAL
  Filled 2011-11-15: qty 1

## 2011-11-15 MED ORDER — NITROGLYCERIN 0.4 MG SL SUBL
0.4000 mg | SUBLINGUAL_TABLET | Freq: Once | SUBLINGUAL | Status: AC
  Administered 2011-11-15: 0.4 mg via SUBLINGUAL

## 2011-11-15 MED ORDER — DOXYCYCLINE HYCLATE 100 MG PO TABS
100.0000 mg | ORAL_TABLET | Freq: Once | ORAL | Status: AC
  Administered 2011-11-15: 100 mg via ORAL
  Filled 2011-11-15: qty 1

## 2011-11-15 MED ORDER — DOXYCYCLINE HYCLATE 100 MG PO CAPS: 100.0000 mg | ORAL_CAPSULE | Freq: Two times a day (BID) | ORAL | Status: DC

## 2011-11-15 MED ORDER — NITROGLYCERIN 0.4 MG SL SUBL
SUBLINGUAL_TABLET | SUBLINGUAL | Status: AC
  Administered 2011-11-15: 0.4 mg via SUBLINGUAL
  Filled 2011-11-15: qty 25

## 2011-11-15 NOTE — ED Notes (Signed)
Patient has rubber stopper missing from one of his crutches and states the crutches got "tangled up yesterday" resulting in a fall. Patient with laceration noted to left stump from AKA, purulent discharge noted, no bleeding, no foul odor.

## 2011-11-15 NOTE — ED Notes (Signed)
Pt reports that he fell yesterday, injured left aka, "reopened knee", had surgery in 1972 to removed leg. Since falling he is unable to wear prosthesis.

## 2011-11-15 NOTE — ED Provider Notes (Signed)
Medical screening examination/treatment/procedure(s) were performed by non-physician practitioner and as supervising physician I was immediately available for consultation/collaboration.  Donnetta Hutching, MD 11/15/11 1535

## 2011-11-15 NOTE — ED Notes (Signed)
Patient returned from x ray c/o chest pain. Patient grabbing chest and groaning/grunting with pain. Rates pain 3/10 on NPS, has h/o angina in past, last episode Friday and 2 SL nitroglycerins alleviated the pain then. IV established, EKG obtained, and Al Decant PA at beside for re-evaluation of patient. VSS at present. Verbal order for 1 SL nitro by Al Decant, PA-C to be given to patient. See MAR for further medication documentation.

## 2011-11-15 NOTE — ED Provider Notes (Signed)
History     CSN: [id number]  Arrival date & time 11/15/11  0940   First MD Initiated Contact with Patient 11/15/11 1016      Chief Complaint  Patient presents with  . Fall    (Consider location/radiation/quality/duration/timing/severity/associated sxs/prior treatment) HPI Comments: Pt has L AKA.  Using crutches yest AM.  One crutch has no rubber foot and it slipped on floor and pt fell onto L leg stump.  He has a lacer to the stump.   No other injuries or complaints.  The history is provided by the patient. No language interpreter was used.    Past Medical History  Diagnosis Date  . Diabetes mellitus   . S/P AKA (above knee amputation)   . MI (myocardial infarction)     Past Surgical History  Procedure Date  . Leg surgery     Repair of left leg trauma  . Coronary angioplasty with stent placement     No family history on file.  History  Substance Use Topics  . Smoking status: Current Every Day Smoker    Types: Cigars  . Smokeless tobacco: Not on file  . Alcohol Use: No      Review of Systems  Constitutional: Negative for fever and chills.  Musculoskeletal:       L femur injury and pain  Skin: Positive for wound.  Neurological: Negative for weakness and numbness.  All other systems reviewed and are negative.    Allergies  Review of patient's allergies indicates no known allergies.  Home Medications   Current Outpatient Rx  Name Route Sig Dispense Refill  . ASPIRIN 81 MG PO CHEW Oral Chew 81 mg by mouth daily.    [name] CLOPIDOGREL BISULFATE 75 MG PO TABS Oral Take 75 mg by mouth daily.    [name] METOPROLOL TARTRATE 25 MG PO TABS Oral Take 0.5 tablets (12.5 mg total) by mouth 2 (two) times daily. 60 tablet 6  . NITROGLYCERIN 0.4 MG SL SUBL Sublingual Place 0.4 mg under the tongue every 5 (five) minutes as needed. Chest pain.    [name] SIMVASTATIN 40 MG PO TABS Oral Take 40 mg by mouth at bedtime.        BP 120/62  Pulse 72  Temp 98.6 F (37 C) (Oral)  Resp 18   SpO2 97%  Physical Exam  Nursing note and vitals reviewed. Constitutional: He is oriented to person, place, and time. He appears well-developed and well-nourished.  HENT:  Head: Normocephalic and atraumatic.  Eyes: EOM are normal.  Neck: Normal range of motion.  Cardiovascular: Normal rate, regular rhythm and intact distal pulses.   Pulmonary/Chest: Effort normal. No respiratory distress.  Abdominal: Soft. He exhibits no distension. There is no tenderness.  Musculoskeletal: Normal range of motion.       Left knee: He exhibits laceration.       Legs: Neurological: He is alert and oriented to person, place, and time.  Skin: Skin is warm and dry.  Psychiatric: He has a normal mood and affect. Judgment normal.    ED Course  Procedures (including critical care time)  Labs Reviewed - No data to display Dg Femur Left  11/15/2011  *RADIOLOGY REPORT*  Clinical Data: Larey Seat on AKA stump  LEFT FEMUR - 2 VIEW  Comparison: None.  Findings: No fracture is seen.  The distal portion of the stump of the mid left femur appears intact and corticated.  There is mild degenerative change in the left hip. There does appear to  be an old fracture of the inferior left pelvic ramus.  IMPRESSION: No acute abnormality.   Original Report Authenticated By: Juline Patch, M.D.    1140-RN called me into room.  Pt began having chest pain while in XR.  He rates it at 3/10.  States he had a similar episode a few days ago at home that resolved with one SL NTG.  i have looked at the EKG and d/w dr. Adriana Simas.  Hpt has no radiation, n/v, diaphoresis or pre-syncopal sxs.  i ordered a NTG and if pain quickly resolves no further investigation will be done.  It may however require further work up.  Date: 11/15/2011  Rate: 59  Rhythm: sinus bradycardia  QRS Axis: normal  Intervals: normal  ST/T Wave abnormalities: normal  Conduction Disutrbances:none  Narrative Interpretation:   Old EKG Reviewed: unchanged from 07-27-2007  1.  Laceration of leg, left       MDM  rx- doxy, 20 Crutches F/u with pcp        Evalina Field, PA 11/15/11 1301

## 2011-11-15 NOTE — ED Notes (Signed)
Patient with no complaints at this time. Respirations even and unlabored. Skin warm/dry. Discharge instructions reviewed with patient at this time. Patient given opportunity to voice concerns/ask questions. IV removed per policy and band-aid applied to site. Patient discharged at this time and left Emergency Department with steady gait.  

## 2011-11-22 DIAGNOSIS — Z23 Encounter for immunization: Secondary | ICD-10-CM | POA: Diagnosis not present

## 2011-11-22 DIAGNOSIS — E119 Type 2 diabetes mellitus without complications: Secondary | ICD-10-CM | POA: Diagnosis not present

## 2011-11-22 DIAGNOSIS — I2510 Atherosclerotic heart disease of native coronary artery without angina pectoris: Secondary | ICD-10-CM | POA: Diagnosis not present

## 2012-01-16 ENCOUNTER — Other Ambulatory Visit: Payer: Self-pay | Admitting: Adult Health

## 2012-01-16 NOTE — Telephone Encounter (Signed)
rx sent to pharmacy by e-script Per noted pt recall in chart for 03-22-12 for follow up with Nishan in [place] office

## 2012-01-25 ENCOUNTER — Other Ambulatory Visit: Payer: Self-pay | Admitting: *Deleted

## 2012-01-25 MED ORDER — CLOPIDOGREL BISULFATE 75 MG PO TABS: 75.0000 mg | ORAL_TABLET | Freq: Every day | ORAL | Status: DC

## 2012-01-26 ENCOUNTER — Other Ambulatory Visit: Payer: Self-pay | Admitting: Cardiology

## 2012-01-26 MED ORDER — CLOPIDOGREL BISULFATE 75 MG PO TABS: 75.0000 mg | ORAL_TABLET | Freq: Every day | ORAL | Status: DC

## 2012-03-16 DIAGNOSIS — Z79899 Other long term (current) drug therapy: Secondary | ICD-10-CM | POA: Diagnosis not present

## 2012-03-16 DIAGNOSIS — I2510 Atherosclerotic heart disease of native coronary artery without angina pectoris: Secondary | ICD-10-CM | POA: Diagnosis not present

## 2012-03-16 DIAGNOSIS — E119 Type 2 diabetes mellitus without complications: Secondary | ICD-10-CM | POA: Diagnosis not present

## 2012-03-16 DIAGNOSIS — E785 Hyperlipidemia, unspecified: Secondary | ICD-10-CM | POA: Diagnosis not present

## 2012-03-19 DIAGNOSIS — I2510 Atherosclerotic heart disease of native coronary artery without angina pectoris: Secondary | ICD-10-CM | POA: Diagnosis not present

## 2012-03-19 DIAGNOSIS — E119 Type 2 diabetes mellitus without complications: Secondary | ICD-10-CM | POA: Diagnosis not present

## 2012-03-19 DIAGNOSIS — Z79899 Other long term (current) drug therapy: Secondary | ICD-10-CM | POA: Diagnosis not present

## 2012-03-19 DIAGNOSIS — E785 Hyperlipidemia, unspecified: Secondary | ICD-10-CM | POA: Diagnosis not present

## 2012-03-22 ENCOUNTER — Encounter: Payer: Self-pay | Admitting: Cardiovascular Disease

## 2012-03-22 ENCOUNTER — Ambulatory Visit (INDEPENDENT_AMBULATORY_CARE_PROVIDER_SITE_OTHER): Payer: Medicare Other | Admitting: Cardiovascular Disease

## 2012-03-22 VITALS — BP 126/62 | HR 55 | Ht 69.0 in | Wt 159.4 lb

## 2012-03-22 DIAGNOSIS — R011 Cardiac murmur, unspecified: Secondary | ICD-10-CM | POA: Diagnosis not present

## 2012-03-22 DIAGNOSIS — I350 Nonrheumatic aortic (valve) stenosis: Secondary | ICD-10-CM

## 2012-03-22 DIAGNOSIS — E119 Type 2 diabetes mellitus without complications: Secondary | ICD-10-CM | POA: Diagnosis not present

## 2012-03-22 DIAGNOSIS — R0989 Other specified symptoms and signs involving the circulatory and respiratory systems: Secondary | ICD-10-CM | POA: Diagnosis not present

## 2012-03-22 DIAGNOSIS — I359 Nonrheumatic aortic valve disorder, unspecified: Secondary | ICD-10-CM | POA: Diagnosis not present

## 2012-03-22 NOTE — Assessment & Plan Note (Signed)
Cholesterol is at goal.  Continue current dose of statin and diet Rx.  No myalgias or side effects.  F/U  LFT's in 6 months. Lab Results  Component Value Date   LDLCALC 67 02/03/2009   Labs with Dr Ouida Sills next week         ]

## 2012-03-22 NOTE — Patient Instructions (Addendum)
Your physician recommends that you schedule a follow-up appointment in: ONE YEAR  Your physician has requested that you have an echocardiogram. Echocardiography is a painless test that uses sound waves to create images of your heart. It provides your doctor with information about the size and shape of your heart and how well your heart's chambers and valves are working. This procedure takes approximately one hour. There are no restrictions for this procedure.    

## 2012-03-22 NOTE — Assessment & Plan Note (Signed)
Discussed low carb diet.  Target hemoglobin A1c is 6.5 or less.  Continue current medications.  

## 2012-03-22 NOTE — Assessment & Plan Note (Signed)
Partially referred murmur from AV no critical disease by duplex Consider f/u duplex in 2 years.  ASA

## 2012-03-22 NOTE — Assessment & Plan Note (Signed)
Active with no chest pain Continue medical Rx

## 2012-03-22 NOTE — Progress Notes (Signed)
Patient ID: [name], male   DOB: [birth year], 62 y.o.   MRN: [id number] [name] is a friendly 61 y/o patient with known history of CAD. He has a history of NSTEMI in 2008, subsequent cardiac catheterization at that time demonstrated, Single-vessel coronary artery disease with high-grade stenosis of the mid left circumflex. Nonobstructive LAD and right coronary artery stenoses.Normal left ventricular systolic function. PCI of the left circumflex was performed and a 2.5x14 mm driver stent was placed.  Since that time he has been doing well with complaints. He continues to be medically compliant. Has used NTG rarely. He cuts wood, walks several miles a day Had to sell his dogs.  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    He had carotid studies completed in 2010 demonstrating minimal plaque at carotid bifurcations bilaterally with miniall associated turbulent flow in the ICA bilaterally.   Echo 11/12 with mild AS and moderate AR  ROS: Denies fever, malais, weight loss, blurry vision, decreased visual acuity, cough, sputum, SOB, hemoptysis, pleuritic pain, palpitaitons, heartburn, abdominal pain, melena, lower extremity edema, claudication, or rash.  All other systems reviewed and negative  General: Affect appropriate Healthy:  appears stated age HEENT: normal Neck supple with no adenopathy JVP normal no bruits no thyromegaly Lungs clear with no wheezing and good diaphragmatic motion Heart:  S1/S2 AS/AR  murmur, no rub, gallop or click PMI normal Abdomen: benighn, BS positve, no tenderness, no AAA no bruit.  No HSM or HJR Distal pulses intact with no bruits No edema Neuro non-focal Skin warm and dry No muscular weakness   Current Outpatient Prescriptions  Medication Sig Dispense Refill  . aspirin 81 MG chewable tablet Chew 81 mg by mouth daily.      . clopidogrel (PLAVIX) 75 MG tablet Take 1 tablet (75 mg total) by mouth daily.  30 tablet  5  . doxycycline (VIBRAMYCIN)  100 MG capsule Take 1 capsule (100 mg total) by mouth 2 (two) times daily.  20 capsule  0  . metoprolol tartrate (LOPRESSOR) 25 MG tablet TAKE 1/2 TABLET BY MOUTH TWICE DAILY  60 tablet  3  . nitroGLYCERIN (NITROSTAT) 0.4 MG SL tablet Place 0.4 mg under the tongue every 5 (five) minutes as needed. Chest pain.      . simvastatin (ZOCOR) 40 MG tablet Take 40 mg by mouth at bedtime.         No current facility-administered medications for this visit.    Allergies  Review of patient's allergies indicates no known allergies.  Electrocardiogram:  SR rate 55 LVH nonspecific ST/T wave changes  Assessment and Plan

## 2012-03-22 NOTE — Assessment & Plan Note (Signed)
AS/AR needs f/u echo NO SBE prophylaxis needed

## 2012-03-26 DIAGNOSIS — I2510 Atherosclerotic heart disease of native coronary artery without angina pectoris: Secondary | ICD-10-CM | POA: Diagnosis not present

## 2012-03-26 DIAGNOSIS — E119 Type 2 diabetes mellitus without complications: Secondary | ICD-10-CM | POA: Diagnosis not present

## 2012-03-26 NOTE — Addendum Note (Signed)
Addended by: Derry Lory A on: 03/26/2012 10:25 AM   Modules accepted: Orders

## 2012-03-30 ENCOUNTER — Ambulatory Visit (HOSPITAL_COMMUNITY)
Admission: RE | Admit: 2012-03-30 | Discharge: 2012-03-30 | Disposition: A | Discharge status: Home / Self Care | Payer: Medicare Other | Source: Ambulatory Visit | Attending: Cardiovascular Disease | Admitting: Cardiovascular Disease

## 2012-03-30 DIAGNOSIS — E785 Hyperlipidemia, unspecified: Secondary | ICD-10-CM | POA: Diagnosis not present

## 2012-03-30 DIAGNOSIS — I359 Nonrheumatic aortic valve disorder, unspecified: Secondary | ICD-10-CM | POA: Insufficient documentation

## 2012-03-30 DIAGNOSIS — R0989 Other specified symptoms and signs involving the circulatory and respiratory systems: Secondary | ICD-10-CM | POA: Insufficient documentation

## 2012-03-30 DIAGNOSIS — I350 Nonrheumatic aortic (valve) stenosis: Secondary | ICD-10-CM

## 2012-03-30 DIAGNOSIS — E119 Type 2 diabetes mellitus without complications: Secondary | ICD-10-CM | POA: Diagnosis not present

## 2012-03-30 NOTE — Progress Notes (Signed)
*  PRELIMINARY RESULTS* Echocardiogram 2D Echocardiogram has been performed.  Conrad [place] 03/30/2012, 11:50 AM

## 2012-04-09 ENCOUNTER — Encounter: Payer: Self-pay | Admitting: *Deleted

## 2012-07-24 DIAGNOSIS — E119 Type 2 diabetes mellitus without complications: Secondary | ICD-10-CM | POA: Diagnosis not present

## 2012-08-02 DIAGNOSIS — I2510 Atherosclerotic heart disease of native coronary artery without angina pectoris: Secondary | ICD-10-CM | POA: Diagnosis not present

## 2012-08-02 DIAGNOSIS — E119 Type 2 diabetes mellitus without complications: Secondary | ICD-10-CM | POA: Diagnosis not present

## 2012-09-19 ENCOUNTER — Other Ambulatory Visit: Payer: Self-pay | Admitting: Adult Health

## 2012-09-25 ENCOUNTER — Other Ambulatory Visit: Payer: Self-pay | Admitting: Cardiovascular Disease

## 2012-11-21 DIAGNOSIS — E119 Type 2 diabetes mellitus without complications: Secondary | ICD-10-CM | POA: Diagnosis not present

## 2012-12-06 DIAGNOSIS — I2510 Atherosclerotic heart disease of native coronary artery without angina pectoris: Secondary | ICD-10-CM | POA: Diagnosis not present

## 2012-12-06 DIAGNOSIS — Z23 Encounter for immunization: Secondary | ICD-10-CM | POA: Diagnosis not present

## 2012-12-06 DIAGNOSIS — E119 Type 2 diabetes mellitus without complications: Secondary | ICD-10-CM | POA: Diagnosis not present

## 2013-01-18 ENCOUNTER — Other Ambulatory Visit: Payer: Self-pay | Admitting: Adult Health

## 2013-02-25 ENCOUNTER — Other Ambulatory Visit: Payer: Self-pay

## 2013-02-25 MED ORDER — CLOPIDOGREL BISULFATE 75 MG PO TABS: 75.0000 mg | ORAL_TABLET | Freq: Every day | ORAL | Status: DC

## 2013-04-02 DIAGNOSIS — Z79899 Other long term (current) drug therapy: Secondary | ICD-10-CM | POA: Diagnosis not present

## 2013-04-02 DIAGNOSIS — E785 Hyperlipidemia, unspecified: Secondary | ICD-10-CM | POA: Diagnosis not present

## 2013-04-02 DIAGNOSIS — I2510 Atherosclerotic heart disease of native coronary artery without angina pectoris: Secondary | ICD-10-CM | POA: Diagnosis not present

## 2013-04-02 DIAGNOSIS — E119 Type 2 diabetes mellitus without complications: Secondary | ICD-10-CM | POA: Diagnosis not present

## 2013-04-02 DIAGNOSIS — Z125 Encounter for screening for malignant neoplasm of prostate: Secondary | ICD-10-CM | POA: Diagnosis not present

## 2013-04-09 DIAGNOSIS — I2510 Atherosclerotic heart disease of native coronary artery without angina pectoris: Secondary | ICD-10-CM | POA: Diagnosis not present

## 2013-04-09 DIAGNOSIS — E785 Hyperlipidemia, unspecified: Secondary | ICD-10-CM | POA: Diagnosis not present

## 2013-04-09 DIAGNOSIS — E119 Type 2 diabetes mellitus without complications: Secondary | ICD-10-CM | POA: Diagnosis not present

## 2013-04-29 ENCOUNTER — Encounter: Payer: Self-pay | Admitting: Cardiovascular Disease

## 2013-04-29 ENCOUNTER — Ambulatory Visit (INDEPENDENT_AMBULATORY_CARE_PROVIDER_SITE_OTHER): Payer: Medicare Other | Admitting: Cardiovascular Disease

## 2013-04-29 VITALS — BP 130/56 | HR 65 | Ht 69.0 in | Wt 167.0 lb

## 2013-04-29 DIAGNOSIS — E785 Hyperlipidemia, unspecified: Secondary | ICD-10-CM

## 2013-04-29 DIAGNOSIS — I2510 Atherosclerotic heart disease of native coronary artery without angina pectoris: Secondary | ICD-10-CM

## 2013-04-29 DIAGNOSIS — I359 Nonrheumatic aortic valve disorder, unspecified: Secondary | ICD-10-CM | POA: Diagnosis not present

## 2013-04-29 DIAGNOSIS — I350 Nonrheumatic aortic (valve) stenosis: Secondary | ICD-10-CM

## 2013-04-29 DIAGNOSIS — I351 Nonrheumatic aortic (valve) insufficiency: Secondary | ICD-10-CM

## 2013-04-29 NOTE — Progress Notes (Signed)
Patient ID: [name], male   DOB: 12-16-1950, 63 y.o.   MRN: [id number]      SUBJECTIVE: The patient is a 63 year old male who I am evaluating for the first time. He has a history of coronary artery disease with prior bare metal stenting of the left circumflex coronary artery in 2008. A recent echocardiogram revealed mild to moderate aortic stenosis, moderate aortic and mild mitral regurgitation. He has normal left ventricular systolic function. He also has a history of diabetes mellitus and hyperlipidemia. He thinks he is just getting over a lung infection. He currently denies fevers and chills. He's been coughing up yellow phlegm. He denies chest pain, palpitations and leg swelling. A lipid panel from March 18 shows total cholesterol 93, triglycerides 97, HDL 25, LDL 49. AST 13 and ALT 11. HbA1c 7.1%. White count 11.3. He smokes up to 3 cigars daily.  ECG today shows normal sinus rhythm, heart rate 60 beats per minute, mild nonspecific T wave and ST segment abnormality.  No Known Allergies  Current Outpatient Prescriptions  Medication Sig Dispense Refill  . aspirin 81 MG chewable tablet Chew 81 mg by mouth daily.      . clopidogrel (PLAVIX) 75 MG tablet Take 1 tablet (75 mg total) by mouth daily.  30 tablet  5  . doxycycline (VIBRAMYCIN) 100 MG capsule Take 1 capsule (100 mg total) by mouth 2 (two) times daily.  20 capsule  0  . metoprolol tartrate (LOPRESSOR) 25 MG tablet TAKE 1/2 TABLET BY MOUTH TWICE DAILY  60 tablet  3  . nitroGLYCERIN (NITROSTAT) 0.4 MG SL tablet Place 0.4 mg under the tongue every 5 (five) minutes as needed. Chest pain.      [name]. NITROSTAT 0.4 MG SL tablet PLACE TABLET(S) UNDER THE TONGUE AS DIRECTED  25 tablet  2  . simvastatin (ZOCOR) 40 MG tablet Take 40 mg by mouth at bedtime.         No current facility-administered medications for this visit.    Past Medical History  Diagnosis Date  . Diabetes mellitus   . S/P AKA (above knee amputation)   . MI  (myocardial infarction)     Past Surgical History  Procedure Laterality Date  . Leg surgery      Repair of left leg trauma  . Coronary angioplasty with stent placement      History   Social History  . Marital Status: Single    Spouse Name: N/A    Number of Children: N/A  . Years of Education: N/A   Occupational History  . Disabled    Social History Main Topics  . Smoking status: Current Every Day Smoker    Types: Cigars  . Smokeless tobacco: Not on file  . Alcohol Use: No  . Drug Use: No  . Sexual Activity: No   Other Topics Concern  . Not on file   Social History Narrative   No regular exercise    BP 130/56  Pulse 65   PHYSICAL EXAM General: NAD Neck: No JVD, no thyromegaly. Lungs: Clear to auscultation bilaterally with normal respiratory effort. CV: Nondisplaced PMI.  Regular rate and rhythm, normal S1/S2, no S3/S4, II/VI ejection-systolic murmur at RUSB, II/VI apical holosystolic murmur, II/IV holodiastolic murmur at left lower sternal border. No pretibial or periankle edema.  No carotid bruit.  Normal pedal pulses.  Abdomen: Soft, nontender, no hepatosplenomegaly, no distention.  Neurologic: Alert and oriented x 3.  Psych: Flat affect. Extremities: No clubbing or cyanosis.  ECG: reviewed and available in electronic records.      ASSESSMENT AND PLAN: 1. CAD: Symptomatically stable. Continue aspirin, beta blocker and statin. Discontinue Plavix. 2. Aortic stenosis and regurgitation: Will follow clinically and with a followup echocardiogram in one year. 3. Hyperlipidemia: Lipids and LFT's as noted above. Continue simvastatin 40 mg daily.  Dispo: f/u 6 months.  Prentice DockerSuresh Koneswaran, M.D., F.A.C.C.

## 2013-04-29 NOTE — Patient Instructions (Signed)
Your physician wants you to follow-up in: 6 months You will receive a reminder letter in the mail two months in advance. If you don't receive a letter, please call our office to schedule the follow-up appointment.   Your physician has recommended you make the following change in your medication:     STOP Plavix    Thank you for choosing [facility] !

## 2013-05-20 DIAGNOSIS — E119 Type 2 diabetes mellitus without complications: Secondary | ICD-10-CM | POA: Diagnosis not present

## 2013-05-20 DIAGNOSIS — H524 Presbyopia: Secondary | ICD-10-CM | POA: Diagnosis not present

## 2013-08-05 DIAGNOSIS — E119 Type 2 diabetes mellitus without complications: Secondary | ICD-10-CM | POA: Diagnosis not present

## 2013-08-13 DIAGNOSIS — E119 Type 2 diabetes mellitus without complications: Secondary | ICD-10-CM | POA: Diagnosis not present

## 2013-08-13 DIAGNOSIS — N529 Male erectile dysfunction, unspecified: Secondary | ICD-10-CM | POA: Diagnosis not present

## 2013-08-13 DIAGNOSIS — I2510 Atherosclerotic heart disease of native coronary artery without angina pectoris: Secondary | ICD-10-CM | POA: Diagnosis not present

## 2013-09-19 ENCOUNTER — Other Ambulatory Visit: Payer: Self-pay | Admitting: Adult Health

## 2013-11-04 ENCOUNTER — Encounter: Payer: Self-pay | Admitting: Cardiovascular Disease

## 2013-11-04 ENCOUNTER — Ambulatory Visit (INDEPENDENT_AMBULATORY_CARE_PROVIDER_SITE_OTHER): Payer: Medicare Other | Admitting: Cardiovascular Disease

## 2013-11-04 VITALS — BP 158/62 | HR 55 | Ht 69.0 in | Wt 156.0 lb

## 2013-11-04 DIAGNOSIS — I351 Nonrheumatic aortic (valve) insufficiency: Secondary | ICD-10-CM | POA: Diagnosis not present

## 2013-11-04 DIAGNOSIS — I2510 Atherosclerotic heart disease of native coronary artery without angina pectoris: Secondary | ICD-10-CM | POA: Diagnosis not present

## 2013-11-04 DIAGNOSIS — I350 Nonrheumatic aortic (valve) stenosis: Secondary | ICD-10-CM

## 2013-11-04 DIAGNOSIS — E785 Hyperlipidemia, unspecified: Secondary | ICD-10-CM | POA: Diagnosis not present

## 2013-11-04 DIAGNOSIS — I10 Essential (primary) hypertension: Secondary | ICD-10-CM

## 2013-11-04 MED ORDER — LISINOPRIL 5 MG PO TABS: 5.0000 mg | ORAL_TABLET | Freq: Every day | ORAL | Status: DC

## 2013-11-04 NOTE — Patient Instructions (Signed)
Your physician wants you to follow-up in: 6 months with Dr. Reggy EyeKoneswaran You will receive a reminder letter in the mail two months in advance. If you don't receive a letter, please call our office to schedule the follow-up appointment.  Your physician has recommended you make the following change in your medication:   START LISINOPRIL 5 MG DAILY  CONTINUE ALL OTHER MEDICATIONS  Thank you for choosing [facility]!!

## 2013-11-04 NOTE — Progress Notes (Signed)
Patient ID: [name], male   DOB: September 04, 1950, 63 y.o.   MRN: [id number]      SUBJECTIVE: The patient returns for follow up of coronary artery disease with prior bare metal stenting of the left circumflex coronary artery in 2008. A previous echocardiogram revealed mild to moderate aortic stenosis, moderate aortic and mild mitral regurgitation. He has normal left ventricular systolic function. He also has a history of diabetes mellitus and hyperlipidemia.  A lipid panel from 04/03/13 showed total cholesterol 93, triglycerides 97, HDL 25, LDL 49. AST 13 and ALT 11. HbA1c 7.1%. He smokes up to 3 cigars daily. He denies chest pain and shortness of breath. He has occasional dizziness but denies syncope. He denies orthopnea and leg swelling.     Review of Systems: As per "subjective", otherwise negative.  No Known Allergies  Current Outpatient Prescriptions  Medication Sig Dispense Refill  . aspirin 81 MG chewable tablet Chew 81 mg by mouth daily.      . metoprolol tartrate (LOPRESSOR) 25 MG tablet TAKE 1/2 TABLET BY MOUTH TWICE DAILY  60 tablet  6  . nitroGLYCERIN (NITROSTAT) 0.4 MG SL tablet Place 0.4 mg under the tongue every 5 (five) minutes as needed. Chest pain.      [name]. NITROSTAT 0.4 MG SL tablet PLACE TABLET(S) UNDER THE TONGUE AS DIRECTED  25 tablet  2  . simvastatin (ZOCOR) 40 MG tablet Take 40 mg by mouth at bedtime.         No current facility-administered medications for this visit.    Past Medical History  Diagnosis Date  . Diabetes mellitus   . S/P AKA (above knee amputation)   . MI (myocardial infarction)     Past Surgical History  Procedure Laterality Date  . Leg surgery      Repair of left leg trauma  . Coronary angioplasty with stent placement      History   Social History  . Marital Status: Single    Spouse Name: N/A    Number of Children: N/A  . Years of Education: N/A   Occupational History  . Disabled    Social History Main Topics  . Smoking  status: Current Every Day Smoker -- 0.30 packs/day    Types: Cigars, Cigarettes  . Smokeless tobacco: Never Used  . Alcohol Use: No  . Drug Use: No  . Sexual Activity: No   Other Topics Concern  . Not on file   Social History Narrative   No regular exercise     Filed Vitals:   11/04/13 1050  BP: 158/62  Pulse: 55  Height: 5\' 9"  (1.753 m)  Weight: 156 lb (70.761 kg)    PHYSICAL EXAM General: NAD  Neck: No JVD, no thyromegaly.  Lungs: Clear to auscultation bilaterally with normal respiratory effort.  CV: Nondisplaced PMI. Regular rate and rhythm, normal S1/S2, no S3/S4, II/VI ejection-systolic murmur at RUSB, II/VI apical holosystolic murmur, II/IV holodiastolic murmur at left lower sternal border. No pretibial or periankle edema. No carotid bruit. Normal pedal pulses.  Abdomen: Soft, nontender, no hepatosplenomegaly, no distention.  Neurologic: Alert and oriented x 3.  Psych: Flat affect. Skin: Normal. Musculoskeletal: Normal range of motion, no gross deformities. Extremities: No clubbing or cyanosis.   ECG: Most recent ECG reviewed.    ASSESSMENT AND PLAN: 1. CAD: Stable ischemic heart disease. Continue aspirin, beta blocker and statin.  2. Aortic stenosis and regurgitation: Symptomatically stable. Will follow clinically and with a follow up echocardiogram when deemed clinically  necessary. 3. Hyperlipidemia: Lipids and LFT's as noted above which are well controlled. Continue simvastatin 40 mg daily.  4. Essential HTN: Elevated today. Will initiate lisinopril 5 mg daily.  Dispo: f/u 6 months.   Prentice DockerSuresh Koneswaran, M.D., F.A.C.C.

## 2013-12-16 DIAGNOSIS — E119 Type 2 diabetes mellitus without complications: Secondary | ICD-10-CM | POA: Diagnosis not present

## 2013-12-17 DIAGNOSIS — E119 Type 2 diabetes mellitus without complications: Secondary | ICD-10-CM | POA: Diagnosis not present

## 2013-12-17 DIAGNOSIS — Z23 Encounter for immunization: Secondary | ICD-10-CM | POA: Diagnosis not present

## 2013-12-17 DIAGNOSIS — S46012S Strain of muscle(s) and tendon(s) of the rotator cuff of left shoulder, sequela: Secondary | ICD-10-CM | POA: Diagnosis not present

## 2013-12-17 DIAGNOSIS — I2510 Atherosclerotic heart disease of native coronary artery without angina pectoris: Secondary | ICD-10-CM | POA: Diagnosis not present

## 2013-12-24 DIAGNOSIS — M25512 Pain in left shoulder: Secondary | ICD-10-CM | POA: Diagnosis not present

## 2013-12-24 DIAGNOSIS — M25511 Pain in right shoulder: Secondary | ICD-10-CM | POA: Diagnosis not present

## 2013-12-24 DIAGNOSIS — Z720 Tobacco use: Secondary | ICD-10-CM | POA: Diagnosis not present

## 2014-01-08 DIAGNOSIS — M25512 Pain in left shoulder: Secondary | ICD-10-CM | POA: Diagnosis not present

## 2014-01-08 DIAGNOSIS — M25511 Pain in right shoulder: Secondary | ICD-10-CM | POA: Diagnosis not present

## 2014-01-08 DIAGNOSIS — Z720 Tobacco use: Secondary | ICD-10-CM | POA: Diagnosis not present

## 2014-04-14 DIAGNOSIS — M1990 Unspecified osteoarthritis, unspecified site: Secondary | ICD-10-CM | POA: Diagnosis not present

## 2014-04-14 DIAGNOSIS — Z79899 Other long term (current) drug therapy: Secondary | ICD-10-CM | POA: Diagnosis not present

## 2014-04-14 DIAGNOSIS — Z125 Encounter for screening for malignant neoplasm of prostate: Secondary | ICD-10-CM | POA: Diagnosis not present

## 2014-04-14 DIAGNOSIS — M2010 Hallux valgus (acquired), unspecified foot: Secondary | ICD-10-CM | POA: Diagnosis not present

## 2014-04-14 DIAGNOSIS — M25512 Pain in left shoulder: Secondary | ICD-10-CM | POA: Diagnosis not present

## 2014-04-14 DIAGNOSIS — Z720 Tobacco use: Secondary | ICD-10-CM | POA: Diagnosis not present

## 2014-04-14 DIAGNOSIS — I2510 Atherosclerotic heart disease of native coronary artery without angina pectoris: Secondary | ICD-10-CM | POA: Diagnosis not present

## 2014-04-14 DIAGNOSIS — E119 Type 2 diabetes mellitus without complications: Secondary | ICD-10-CM | POA: Diagnosis not present

## 2014-04-14 DIAGNOSIS — Z6823 Body mass index (BMI) 23.0-23.9, adult: Secondary | ICD-10-CM | POA: Diagnosis not present

## 2014-04-24 DIAGNOSIS — I10 Essential (primary) hypertension: Secondary | ICD-10-CM | POA: Diagnosis not present

## 2014-04-24 DIAGNOSIS — E119 Type 2 diabetes mellitus without complications: Secondary | ICD-10-CM | POA: Diagnosis not present

## 2014-04-24 DIAGNOSIS — I2510 Atherosclerotic heart disease of native coronary artery without angina pectoris: Secondary | ICD-10-CM | POA: Diagnosis not present

## 2014-04-24 DIAGNOSIS — E785 Hyperlipidemia, unspecified: Secondary | ICD-10-CM | POA: Diagnosis not present

## 2014-05-23 ENCOUNTER — Encounter: Payer: Self-pay | Admitting: Cardiovascular Disease

## 2014-05-23 ENCOUNTER — Ambulatory Visit (INDEPENDENT_AMBULATORY_CARE_PROVIDER_SITE_OTHER): Payer: Medicare Other | Admitting: Cardiovascular Disease

## 2014-05-23 VITALS — BP 128/56 | HR 76 | Ht 69.0 in | Wt 154.0 lb

## 2014-05-23 DIAGNOSIS — I10 Essential (primary) hypertension: Secondary | ICD-10-CM

## 2014-05-23 DIAGNOSIS — E785 Hyperlipidemia, unspecified: Secondary | ICD-10-CM

## 2014-05-23 DIAGNOSIS — I350 Nonrheumatic aortic (valve) stenosis: Secondary | ICD-10-CM

## 2014-05-23 DIAGNOSIS — I2510 Atherosclerotic heart disease of native coronary artery without angina pectoris: Secondary | ICD-10-CM | POA: Diagnosis not present

## 2014-05-23 DIAGNOSIS — I351 Nonrheumatic aortic (valve) insufficiency: Secondary | ICD-10-CM

## 2014-05-23 NOTE — Patient Instructions (Addendum)
Your physician wants you to follow-up in: 6 months.  You will receive a reminder letter in the mail two months in advance. If you don't receive a letter, please call our office to schedule the follow-up appointment.   Your physician has requested that you have an echocardiogram. Echocardiography is a painless test that uses sound waves to create images of your heart. It provides your doctor with information about the size and shape of your heart and how well your heart's chambers and valves are working. This procedure takes approximately one hour. There are no restrictions for this procedure.  Your physician recommends that you continue on your current medications as directed. Please refer to the Current Medication list given to you today.  Thank you for choosing [facility]!
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
## 2014-06-23 ENCOUNTER — Ambulatory Visit (HOSPITAL_COMMUNITY): Payer: Medicare Other

## 2014-06-30 ENCOUNTER — Ambulatory Visit (HOSPITAL_COMMUNITY)
Admission: RE | Admit: 2014-06-30 | Discharge: 2014-06-30 | Disposition: A | Discharge status: Home / Self Care | Payer: Medicare Other | Source: Ambulatory Visit | Attending: Cardiovascular Disease | Admitting: Cardiovascular Disease

## 2014-06-30 DIAGNOSIS — I350 Nonrheumatic aortic (valve) stenosis: Secondary | ICD-10-CM | POA: Diagnosis not present

## 2014-07-07 ENCOUNTER — Telehealth: Payer: Self-pay | Admitting: *Deleted

## 2014-07-07 DIAGNOSIS — I359 Nonrheumatic aortic valve disorder, unspecified: Secondary | ICD-10-CM

## 2014-07-07 NOTE — Telephone Encounter (Signed)
-----   Message from Laqueta Linden, MD sent at 07/07/2014  4:31 PM EDT ----- No need for appt with Dr. Excell Seltzer. I can see him in November. Thanks.  ----- Message -----    From: Kerney Elbe, LPN    Sent: 2/70/7867  11:26 AM      To: Laqueta Linden, MD  Does Pt still need appt with Dr. Excell Seltzer? He is in recall with you for Nov. Would you like to see him sooner?  ----- Message -----    From: Laqueta Linden, MD    Sent: 07/07/2014  11:00 AM      To: Karl Pock Armondo Cech, LPN  Needs repeat echo in 6 months.

## 2014-08-12 DIAGNOSIS — Z6822 Body mass index (BMI) 22.0-22.9, adult: Secondary | ICD-10-CM | POA: Diagnosis not present

## 2014-08-12 DIAGNOSIS — M25512 Pain in left shoulder: Secondary | ICD-10-CM | POA: Diagnosis not present

## 2014-08-12 DIAGNOSIS — Z720 Tobacco use: Secondary | ICD-10-CM | POA: Diagnosis not present

## 2014-08-22 DIAGNOSIS — E119 Type 2 diabetes mellitus without complications: Secondary | ICD-10-CM | POA: Diagnosis not present

## 2014-09-09 DIAGNOSIS — Z6823 Body mass index (BMI) 23.0-23.9, adult: Secondary | ICD-10-CM | POA: Diagnosis not present

## 2014-09-09 DIAGNOSIS — E119 Type 2 diabetes mellitus without complications: Secondary | ICD-10-CM | POA: Diagnosis not present

## 2014-09-09 DIAGNOSIS — N522 Drug-induced erectile dysfunction: Secondary | ICD-10-CM | POA: Diagnosis not present

## 2014-09-09 DIAGNOSIS — I2510 Atherosclerotic heart disease of native coronary artery without angina pectoris: Secondary | ICD-10-CM | POA: Diagnosis not present

## 2014-09-30 ENCOUNTER — Other Ambulatory Visit: Payer: Self-pay

## 2014-09-30 MED ORDER — METOPROLOL TARTRATE 25 MG PO TABS: 12.5000 mg | ORAL_TABLET | Freq: Two times a day (BID) | ORAL | Status: DC

## 2014-11-17 DIAGNOSIS — Z720 Tobacco use: Secondary | ICD-10-CM | POA: Diagnosis not present

## 2014-11-17 DIAGNOSIS — Z6822 Body mass index (BMI) 22.0-22.9, adult: Secondary | ICD-10-CM | POA: Diagnosis not present

## 2014-11-17 DIAGNOSIS — M25512 Pain in left shoulder: Secondary | ICD-10-CM | POA: Diagnosis not present

## 2014-11-17 DIAGNOSIS — E119 Type 2 diabetes mellitus without complications: Secondary | ICD-10-CM | POA: Diagnosis not present

## 2015-01-07 DIAGNOSIS — E119 Type 2 diabetes mellitus without complications: Secondary | ICD-10-CM | POA: Diagnosis not present

## 2015-01-15 DIAGNOSIS — Z23 Encounter for immunization: Secondary | ICD-10-CM | POA: Diagnosis not present

## 2015-01-15 DIAGNOSIS — I2510 Atherosclerotic heart disease of native coronary artery without angina pectoris: Secondary | ICD-10-CM | POA: Diagnosis not present

## 2015-01-15 DIAGNOSIS — E119 Type 2 diabetes mellitus without complications: Secondary | ICD-10-CM | POA: Diagnosis not present

## 2015-01-15 DIAGNOSIS — N529 Male erectile dysfunction, unspecified: Secondary | ICD-10-CM | POA: Diagnosis not present

## 2015-02-16 DIAGNOSIS — Z720 Tobacco use: Secondary | ICD-10-CM | POA: Diagnosis not present

## 2015-02-16 DIAGNOSIS — E119 Type 2 diabetes mellitus without complications: Secondary | ICD-10-CM | POA: Diagnosis not present

## 2015-02-16 DIAGNOSIS — M25512 Pain in left shoulder: Secondary | ICD-10-CM | POA: Diagnosis not present

## 2015-02-16 DIAGNOSIS — Z6823 Body mass index (BMI) 23.0-23.9, adult: Secondary | ICD-10-CM | POA: Diagnosis not present

## 2015-03-19 ENCOUNTER — Telehealth: Payer: Self-pay | Admitting: *Deleted

## 2015-03-19 MED ORDER — HYDROCODONE-ACETAMINOPHEN 7.5-325 MG PO TABS: 1.0000 | ORAL_TABLET | ORAL | Status: DC | PRN

## 2015-03-19 NOTE — Addendum Note (Signed)
Addended by: Earnstine Regal on: 03/19/2015 04:12 PM   Modules accepted: Orders

## 2015-03-19 NOTE — Telephone Encounter (Signed)
Rx done. 

## 2015-03-19 NOTE — Telephone Encounter (Signed)
Patient called requesting his pain medication to be refilled, patient said he is coming to get it tomorrow and I made him aware of our new process with prescriptions, patient stated he will pick his prescription up tomorrow when he comes to [place] to pay his bill. I asked the patient what was the medication he needs refilled patient stated he did not know the name of it. Please advise   [phone]

## 2015-04-14 ENCOUNTER — Telehealth: Payer: Self-pay | Admitting: Orthopaedic Surgery

## 2015-04-14 MED ORDER — HYDROCODONE-ACETAMINOPHEN 7.5-325 MG PO TABS: 1.0000 | ORAL_TABLET | ORAL | Status: DC | PRN

## 2015-04-14 NOTE — Telephone Encounter (Signed)
Rx done. 

## 2015-04-14 NOTE — Telephone Encounter (Signed)
Patient requesting Hydrocodone refill

## 2015-04-14 NOTE — Telephone Encounter (Signed)
Error/disregard

## 2015-05-11 DIAGNOSIS — M2010 Hallux valgus (acquired), unspecified foot: Secondary | ICD-10-CM | POA: Diagnosis not present

## 2015-05-11 DIAGNOSIS — Z125 Encounter for screening for malignant neoplasm of prostate: Secondary | ICD-10-CM | POA: Diagnosis not present

## 2015-05-11 DIAGNOSIS — E119 Type 2 diabetes mellitus without complications: Secondary | ICD-10-CM | POA: Diagnosis not present

## 2015-05-11 DIAGNOSIS — I2510 Atherosclerotic heart disease of native coronary artery without angina pectoris: Secondary | ICD-10-CM | POA: Diagnosis not present

## 2015-05-11 DIAGNOSIS — M1990 Unspecified osteoarthritis, unspecified site: Secondary | ICD-10-CM | POA: Diagnosis not present

## 2015-05-11 DIAGNOSIS — Z79899 Other long term (current) drug therapy: Secondary | ICD-10-CM | POA: Diagnosis not present

## 2015-05-13 ENCOUNTER — Ambulatory Visit (INDEPENDENT_AMBULATORY_CARE_PROVIDER_SITE_OTHER): Payer: Medicare Other | Admitting: Orthopaedic Surgery

## 2015-05-13 ENCOUNTER — Encounter: Payer: Self-pay | Admitting: Orthopaedic Surgery

## 2015-05-13 VITALS — BP 122/54 | HR 56 | Temp 97.5°F | Resp 16 | Ht 69.0 in | Wt 153.0 lb

## 2015-05-13 DIAGNOSIS — I350 Nonrheumatic aortic (valve) stenosis: Secondary | ICD-10-CM | POA: Diagnosis not present

## 2015-05-13 DIAGNOSIS — I351 Nonrheumatic aortic (valve) insufficiency: Secondary | ICD-10-CM

## 2015-05-13 DIAGNOSIS — M25512 Pain in left shoulder: Secondary | ICD-10-CM

## 2015-05-13 NOTE — Progress Notes (Signed)
Patient ZO:XWRUEA [name], male DOB:[birth year], 65 y.o. [id number]  Chief Complaint  Patient presents with  . Follow-up    left shoulder    HPI  [name] is a 65 y.o. male who has chronic left shoulder pain.  The pain comes and goes.  He is slowly getting better.  He has no paresthesias.  He has no redness.  He has no trauma.   Shoulder Pain  The pain is present in the left shoulder. This is a chronic problem. The current episode started more than 1 year ago. There has been no history of extremity trauma. The problem has been gradually improving. The quality of the pain is described as aching. The pain is at a severity of 2/10. The pain is mild. The symptoms are aggravated by activity. He has tried cold, heat, NSAIDS, rest and oral narcotics for the symptoms. The treatment provided moderate relief. His past medical history is significant for diabetes and osteoarthritis.    Body mass index is 22.58 kg/(m^2).  Review of Systems  HENT: Negative for congestion.   Respiratory: Negative for cough and shortness of breath.   Cardiovascular: Negative for chest pain and leg swelling.  Endocrine: Positive for cold intolerance.  Musculoskeletal: Positive for arthralgias.  Allergic/Immunologic: Positive for environmental allergies.    Past Medical History  Diagnosis Date  . Diabetes mellitus   . S/P AKA (above knee amputation) (HCC)   . MI (myocardial infarction) [facility])     Past Surgical History  Procedure Laterality Date  . Leg surgery      Repair of left leg trauma  . Coronary angioplasty with stent placement      History reviewed. No pertinent family history.  Social History Social History  Substance Use Topics  . Smoking status: Current Every Day Smoker -- 0.30 packs/day    Types: Cigars, Cigarettes    Start date: 01/18/1956  . Smokeless tobacco: Never Used  . Alcohol Use: No    No Known Allergies  Current Outpatient Prescriptions  Medication Sig Dispense  Refill  . aspirin 81 MG chewable tablet Chew 81 mg by mouth daily.    [name] HYDROcodone-acetaminophen (NORCO) 7.5-325 MG tablet Take 1 tablet by mouth every 4 (four) hours as needed for moderate pain (Must last 30 days.  Do not drive or operate machinery while taking this medicine.). 120 tablet 0  . lisinopril (PRINIVIL,ZESTRIL) 5 MG tablet Take 1 tablet (5 mg total) by mouth daily. 30 tablet 6  . metoprolol tartrate (LOPRESSOR) 25 MG tablet Take 0.5 tablets (12.5 mg total) by mouth 2 (two) times daily. 60 tablet 6  . naproxen (NAPROSYN) 500 MG tablet   5  . nitroGLYCERIN (NITROSTAT) 0.4 MG SL tablet Place 0.4 mg under the tongue every 5 (five) minutes as needed. Chest pain.    . simvastatin (ZOCOR) 40 MG tablet Take 40 mg by mouth at bedtime.       No current facility-administered medications for this visit.     Physical Exam  Blood pressure 122/54, pulse 56, temperature 97.5 F (36.4 C), resp. rate 16, height  (1.753 m), weight 153 lb (69.4 kg).  Constitutional: overall normal hygiene, normal nutrition, well developed, normal grooming, normal body habitus. Assistive device:none  Musculoskeletal: gait and station Limp none, muscle tone and strength are normal, no tremors or atrophy is present.  .  Neurological: coordination overall normal.  Deep tendon reflex/nerve stretch intact.  Sensation normal.  Cranial nerves II-XII intact.   Skin:  normal overall no scars, lesions, ulcers or rashes. No psoriasis.  Psychiatric: Alert and oriented x 3.  Recent memory intact, remote memory unclear.  Normal mood and affect. Well groomed.  Good eye contact.  Cardiovascular: overall no swelling, no varicosities, no edema bilaterally, normal temperatures of the legs and arms, no clubbing, cyanosis and good capillary refill.  Lymphatic: palpation is normal. Examination of left Upper Extremity is done.  Inspection:   Overall:  Elbow non-tender without crepitus or defects, forearm non-tender without  crepitus or defects, wrist non-tender without crepitus or defects, hand non-tender.    Shoulder: with glenohumeral joint tenderness, without effusion.   Upper arm: without swelling and tenderness   Range of motion:   Overall:  Full range of motion of the elbow, full range of motion of wrist and full range of motion in fingers.   Shoulder:  left  180 degrees forward flexion; 180 degrees abduction; 40 degrees internal rotation, 40 degrees external rotation, 25 degrees extension, 40 degrees adduction.   Stability:   Overall:  Shoulder, elbow and wrist stable   Strength and Tone:   Overall full shoulder muscles strength, full upper arm strength and normal upper arm bulk and tone.  His right shoulder is negative.  I have talked with him again about his smoking.  He has heart disease and still smokes.  He says he just cannot do it now.  I have strongly urged him to cut back.  He has diabetes well controlled by diet.  He watches his blood sugars.  He and I went over exercises for the shoulder for him to continue to do.  The patient has been educated about the nature of the problem(s) and counseled on treatment options.  The patient appeared to understand what I have discussed and is in agreement with it.  Encounter Diagnoses  Name Primary?  . Left shoulder pain Yes  . Aortic stenosis   . Aortic regurgitation     PLAN Call if any problems.  Precautions discussed.  Continue current medications.   Return to clinic 3 months

## 2015-05-15 DIAGNOSIS — Z6824 Body mass index (BMI) 24.0-24.9, adult: Secondary | ICD-10-CM | POA: Diagnosis not present

## 2015-05-15 DIAGNOSIS — E785 Hyperlipidemia, unspecified: Secondary | ICD-10-CM | POA: Diagnosis not present

## 2015-05-15 DIAGNOSIS — E119 Type 2 diabetes mellitus without complications: Secondary | ICD-10-CM | POA: Diagnosis not present

## 2015-05-15 DIAGNOSIS — I2510 Atherosclerotic heart disease of native coronary artery without angina pectoris: Secondary | ICD-10-CM | POA: Diagnosis not present

## 2015-05-19 ENCOUNTER — Telehealth: Payer: Self-pay | Admitting: Orthopaedic Surgery

## 2015-05-19 MED ORDER — HYDROCODONE-ACETAMINOPHEN 7.5-325 MG PO TABS: 1.0000 | ORAL_TABLET | ORAL | Status: DC | PRN

## 2015-05-19 NOTE — Telephone Encounter (Signed)
Hydrocodone-Acetaminophen 7.5/325mg Qty 120 Tablets °

## 2015-05-19 NOTE — Telephone Encounter (Signed)
Rx done. 

## 2015-06-18 ENCOUNTER — Telehealth: Payer: Self-pay | Admitting: Orthopaedic Surgery

## 2015-06-18 MED ORDER — HYDROCODONE-ACETAMINOPHEN 7.5-325 MG PO TABS: 1.0000 | ORAL_TABLET | ORAL | Status: DC | PRN

## 2015-06-18 NOTE — Telephone Encounter (Signed)
Patient called to request refill, pain medication: HYDROcodone-acetaminophen (NORCO) 7.5-325 MG tablet [[id number]][[id number]] - quantity 120.

## 2015-06-18 NOTE — Telephone Encounter (Signed)
Rx done. 

## 2015-07-16 ENCOUNTER — Encounter: Payer: Self-pay | Admitting: Orthopaedic Surgery

## 2015-07-16 ENCOUNTER — Ambulatory Visit (INDEPENDENT_AMBULATORY_CARE_PROVIDER_SITE_OTHER): Payer: Medicare Other | Admitting: Orthopaedic Surgery

## 2015-07-16 VITALS — BP 134/60 | HR 54 | Temp 97.5°F | Resp 16 | Ht 68.0 in | Wt 146.0 lb

## 2015-07-16 DIAGNOSIS — M25512 Pain in left shoulder: Secondary | ICD-10-CM

## 2015-07-16 DIAGNOSIS — I351 Nonrheumatic aortic (valve) insufficiency: Secondary | ICD-10-CM | POA: Diagnosis not present

## 2015-07-16 DIAGNOSIS — I350 Nonrheumatic aortic (valve) stenosis: Secondary | ICD-10-CM | POA: Diagnosis not present

## 2015-07-16 MED ORDER — HYDROCODONE-ACETAMINOPHEN 7.5-325 MG PO TABS: 1.0000 | ORAL_TABLET | ORAL | Status: DC | PRN

## 2015-07-16 NOTE — Progress Notes (Signed)
Patient EA:VWUJWJ:[name], male DOB:[birth year], 65 y.o. [id number]  Chief Complaint  Patient presents with  . Follow-up    left shoulder pain    HPI  [name] is a 65 y.o. male who has chronic left shoulder pain. He has no paresthesias or trauma.  He has been doing his exercises daily.  He has some soreness at times. He has some slight right shoulder pain also.  He takes his pain medicine as needed.  HPI  Body mass index is 22.2 kg/(m^2).  ROS  Review of Systems  HENT: Negative for congestion.   Respiratory: Negative for cough and shortness of breath.   Cardiovascular: Negative for chest pain and leg swelling.  Endocrine: Positive for cold intolerance.  Musculoskeletal: Positive for arthralgias.  Allergic/Immunologic: Positive for environmental allergies.    Past Medical History  Diagnosis Date  . Diabetes mellitus   . S/P AKA (above knee amputation) (HCC)   . MI (myocardial infarction) [facility](HCC)     Past Surgical History  Procedure Laterality Date  . Leg surgery      Repair of left leg trauma  . Coronary angioplasty with stent placement      History reviewed. No pertinent family history.  Social History Social History  Substance Use Topics  . Smoking status: Current Every Day Smoker -- 0.30 packs/day    Types: Cigars, Cigarettes    Start date: 01/18/1956  . Smokeless tobacco: Never Used  . Alcohol Use: No    No Known Allergies  Current Outpatient Prescriptions  Medication Sig Dispense Refill  . aspirin 81 MG chewable tablet Chew 81 mg by mouth daily.    [name]. HYDROcodone-acetaminophen (NORCO) 7.5-325 MG tablet Take 1 tablet by mouth every 4 (four) hours as needed for moderate pain (Must last 30 days.  Do not drive or operate machinery while taking this medicine.). 120 tablet 0  . lisinopril (PRINIVIL,ZESTRIL) 5 MG tablet Take 1 tablet (5 mg total) by mouth daily. 30 tablet 6  . metoprolol tartrate (LOPRESSOR) 25 MG tablet Take 0.5 tablets (12.5 mg  total) by mouth 2 (two) times daily. 60 tablet 6  . naproxen (NAPROSYN) 500 MG tablet   5  . nitroGLYCERIN (NITROSTAT) 0.4 MG SL tablet Place 0.4 mg under the tongue every 5 (five) minutes as needed. Chest pain.    . simvastatin (ZOCOR) 40 MG tablet Take 40 mg by mouth at bedtime.       No current facility-administered medications for this visit.     Physical Exam  Blood pressure 134/60, pulse 54, temperature 97.5 F (36.4 C), resp. rate 16, height 5\' 8"  (1.727 m), weight 146 lb (66.225 kg).  Constitutional: overall normal hygiene, normal nutrition, well developed, normal grooming, normal body habitus. Assistive device:none  Musculoskeletal: gait and station Limp none, muscle tone and strength are normal, no tremors or atrophy is present.  .  Neurological: coordination overall normal.  Deep tendon reflex/nerve stretch intact.  Sensation normal.  Cranial nerves II-XII intact.   Skin:   normal overall no scars, lesions, ulcers or rashes. No psoriasis.  Psychiatric: Alert and oriented x 3.  Recent memory intact, remote memory unclear.  Normal mood and affect. Well groomed.  Good eye contact.  Cardiovascular: overall no swelling, no varicosities, no edema bilaterally, normal temperatures of the legs and arms, no clubbing, cyanosis and good capillary refill.  Lymphatic: palpation is normal.  Examination of left Upper Extremity is done.  Inspection:   Overall:  Elbow non-tender without crepitus or  defects, forearm non-tender without crepitus or defects, wrist non-tender without crepitus or defects, hand non-tender.    Shoulder: with glenohumeral joint tenderness, without effusion.   Upper arm: without swelling and tenderness   Range of motion:   Overall:  Full range of motion of the elbow, full range of motion of wrist and full range of motion in fingers.   Shoulder:  left  180 degrees forward flexion; 165 degrees abduction; 35 degrees internal rotation, 35 degrees external rotation,  20 degrees extension, 40 degrees adduction.   Stability:   Overall:  Shoulder, elbow and wrist stable   Strength and Tone:   Overall full shoulder muscles strength, full upper arm strength and normal upper arm bulk and tone.  The right shoulder is slightly tender but has full motion and no crepitus. Grips both hands normal.  The patient has been educated about the nature of the problem(s) and counseled on treatment options.  The patient appeared to understand what I have discussed and is in agreement with it.  Encounter Diagnoses  Name Primary?  . Left shoulder pain Yes  . Aortic stenosis   . Aortic regurgitation     PLAN Call if any problems.  Precautions discussed.  Continue current medications.   Return to clinic 2 months   Continue exercises.  Continue his Naprosyn.  Electronically Signed Darreld McleanWayne Eniya Cannady, MD 6/29/201711:07 AM

## 2015-08-12 ENCOUNTER — Ambulatory Visit: Payer: Medicare Other | Admitting: Orthopaedic Surgery

## 2015-08-19 ENCOUNTER — Telehealth: Payer: Self-pay | Admitting: Orthopaedic Surgery

## 2015-08-19 MED ORDER — HYDROCODONE-ACETAMINOPHEN 5-325 MG PO TABS: 1.0000 | ORAL_TABLET | ORAL | 0 refills | Status: DC | PRN

## 2015-08-19 NOTE — Telephone Encounter (Signed)
Patient requests refill on:  HYDROcodone-acetaminophen (NORCO) 7.5-325 MG tablet 120 tablet

## 2015-09-09 DIAGNOSIS — E119 Type 2 diabetes mellitus without complications: Secondary | ICD-10-CM | POA: Diagnosis not present

## 2015-09-14 ENCOUNTER — Telehealth: Payer: Self-pay | Admitting: Orthopaedic Surgery

## 2015-09-14 NOTE — Telephone Encounter (Signed)
Patient requests refill on:  °HYDROcodone-acetaminophen (NORCO/VICODIN) 5-325 MG tablet 120 tablet  ° ° °

## 2015-09-15 MED ORDER — HYDROCODONE-ACETAMINOPHEN 5-325 MG PO TABS: 1.0000 | ORAL_TABLET | Freq: Four times a day (QID) | ORAL | 0 refills | Status: DC | PRN

## 2015-09-17 DIAGNOSIS — E119 Type 2 diabetes mellitus without complications: Secondary | ICD-10-CM | POA: Diagnosis not present

## 2015-09-17 DIAGNOSIS — I060 Rheumatic aortic stenosis: Secondary | ICD-10-CM | POA: Diagnosis not present

## 2015-09-17 DIAGNOSIS — I2510 Atherosclerotic heart disease of native coronary artery without angina pectoris: Secondary | ICD-10-CM | POA: Diagnosis not present

## 2015-09-22 ENCOUNTER — Ambulatory Visit (INDEPENDENT_AMBULATORY_CARE_PROVIDER_SITE_OTHER): Payer: Medicare Other | Admitting: Orthopaedic Surgery

## 2015-09-22 ENCOUNTER — Encounter: Payer: Self-pay | Admitting: Orthopaedic Surgery

## 2015-09-22 VITALS — BP 119/53 | HR 58 | Temp 97.5°F | Ht 66.75 in | Wt 141.6 lb

## 2015-09-22 DIAGNOSIS — F17200 Nicotine dependence, unspecified, uncomplicated: Secondary | ICD-10-CM

## 2015-09-22 DIAGNOSIS — M25512 Pain in left shoulder: Secondary | ICD-10-CM

## 2015-09-22 DIAGNOSIS — Z720 Tobacco use: Secondary | ICD-10-CM | POA: Diagnosis not present

## 2015-09-22 DIAGNOSIS — I350 Nonrheumatic aortic (valve) stenosis: Secondary | ICD-10-CM | POA: Diagnosis not present

## 2015-09-22 NOTE — Progress Notes (Signed)
Patient ON:GEXBMW:[name], male DOB:[birth year], 65 y.o. UXL:[id number]  Chief Complaint  Patient presents with  . Follow-up    Left shoulder    HPI  [name] is a 65 y.o. male who has left shoulder pain.  He is stable.  He has no paresthesias or new trauma.  He has no redness or swelling.  He is doing his exercises.  He smokes cigars and I have asked that he cut back or stop.  He will consider this.  HPI  Body mass index is 22.34 kg/m.  ROS  Review of Systems  HENT: Negative for congestion.   Respiratory: Negative for cough and shortness of breath.   Cardiovascular: Negative for chest pain and leg swelling.  Endocrine: Positive for cold intolerance.  Musculoskeletal: Positive for arthralgias.  Allergic/Immunologic: Positive for environmental allergies.    Past Medical History:  Diagnosis Date  . Diabetes mellitus   . MI (myocardial infarction) (HCC)   . S/P AKA (above knee amputation) (HCC)     Past Surgical History:  Procedure Laterality Date  . CORONARY ANGIOPLASTY WITH STENT PLACEMENT    . LEG SURGERY     Repair of left leg trauma    History reviewed. No pertinent family history.  Social History Social History  Substance Use Topics  . Smoking status: Current Every Day Smoker    Packs/day: 0.30    Types: Cigars, Cigarettes    Start date: 01/18/1956  . Smokeless tobacco: Never Used  . Alcohol use No    No Known Allergies  Current Outpatient Prescriptions  Medication Sig Dispense Refill  . aspirin 81 MG chewable tablet Chew 81 mg by mouth daily.    [name]. HYDROcodone-acetaminophen (NORCO/VICODIN) 5-325 MG tablet Take 1 tablet by mouth every 6 (six) hours as needed for moderate pain (Must last 30 days.Do not take and drive a car or use machinery.). 110 tablet 0  . lisinopril (PRINIVIL,ZESTRIL) 5 MG tablet Take 1 tablet (5 mg total) by mouth daily. 30 tablet 6  . metoprolol tartrate (LOPRESSOR) 25 MG tablet Take 0.5 tablets (12.5 mg total) by mouth 2  (two) times daily. 60 tablet 6  . naproxen (NAPROSYN) 500 MG tablet   5  . nitroGLYCERIN (NITROSTAT) 0.4 MG SL tablet Place 0.4 mg under the tongue every 5 (five) minutes as needed. Chest pain.    . simvastatin (ZOCOR) 40 MG tablet Take 40 mg by mouth at bedtime.       No current facility-administered medications for this visit.      Physical Exam  Blood pressure (!) 119/53, pulse (!) 58, temperature 97.5 F (36.4 C), height 5' 6.75" (1.695 m), weight 141 lb 9.6 oz (64.2 kg).  Constitutional: overall normal hygiene, normal nutrition, well developed, normal grooming, normal body habitus. Assistive device:none  Musculoskeletal: gait and station Limp none, muscle tone and strength are normal, no tremors or atrophy is present.  .  Neurological: coordination overall normal.  Deep tendon reflex/nerve stretch intact.  Sensation normal.  Cranial nerves II-XII intact.   Skin:   normal overall no scars, lesions, ulcers or rashes. No psoriasis.  Psychiatric: Alert and oriented x 3.  Recent memory intact, remote memory unclear.  Normal mood and affect. Well groomed.  Good eye contact.  Cardiovascular: overall no swelling, no varicosities, no edema bilaterally, normal temperatures of the legs and arms, no clubbing, cyanosis and good capillary refill.  Lymphatic: palpation is normal.  Examination of left Upper Extremity is done.  Inspection:   Overall:  Elbow non-tender without crepitus or defects, forearm non-tender without crepitus or defects, wrist non-tender without crepitus or defects, hand non-tender.    Shoulder: with glenohumeral joint tenderness, without effusion.   Upper arm: without swelling and tenderness   Range of motion:   Overall:  Full range of motion of the elbow, full range of motion of wrist and full range of motion in fingers.   Shoulder:  left  165 degrees forward flexion; 145 degrees abduction; 35 degrees internal rotation, 35 degrees external rotation, 15 degrees  extension, 40 degrees adduction.   Stability:   Overall:  Shoulder, elbow and wrist stable   Strength and Tone:   Overall full shoulder muscles strength, full upper arm strength and normal upper arm bulk and tone.   The patient has been educated about the nature of the problem(s) and counseled on treatment options.  The patient appeared to understand what I have discussed and is in agreement with it.  Encounter Diagnoses  Name Primary?  . Left shoulder pain Yes  . Aortic stenosis   . Tobacco smoker within last 12 months     PLAN Call if any problems.  Precautions discussed.  Continue current medications.   Return to clinic 2 months   Electronically Signed Darreld Mclean, MD 9/5/20179:47 AM

## 2015-09-22 NOTE — Patient Instructions (Signed)
You Can Quit Smoking If you are ready to quit smoking or are thinking about it, congratulations! You have chosen to help yourself be healthier and live longer! There are lots of different ways to quit smoking. Nicotine gum, nicotine patches, a nicotine inhaler, or nicotine nasal spray can help with physical craving. Hypnosis, support groups, and medicines help break the habit of smoking. TIPS TO GET OFF AND STAY OFF CIGARETTES  Learn to predict your moods. Do not let a bad situation be your excuse to have a cigarette. Some situations in your life might tempt you to have a cigarette.  Ask friends and co-workers not to smoke around you.  Make your home smoke-free.  Never have "just one" cigarette. It leads to wanting another and another. Remind yourself of your decision to quit.  On a card, make a list of your reasons for not smoking. Read it at least the same number of times a day as you have a cigarette. Tell yourself everyday, "I do not want to smoke. I choose not to smoke."  Ask someone at home or work to help you with your plan to quit smoking.  Have something planned after you eat or have a cup of coffee. Take a walk or get other exercise to perk you up. This will help to keep you from overeating.  Try a relaxation exercise to calm you down and decrease your stress. Remember, you may be tense and nervous the first two weeks after you quit. This will pass.  Find new activities to keep your hands busy. Play with a pen, coin, or rubber band. Doodle or draw things on paper.  Brush your teeth right after eating. This will help cut down the craving for the taste of tobacco after meals. You can try mouthwash too.  Try gum, breath mints, or diet candy to keep something in your mouth. IF YOU SMOKE AND WANT TO QUIT:  Do not stock up on cigarettes. Never buy a carton. Wait until one pack is finished before you buy another.  Never carry cigarettes with you at work or at home.  Keep cigarettes  as far away from you as possible. Leave them with someone else.  Never carry matches or a lighter with you.  Ask yourself, "Do I need this cigarette or is this just a reflex?"  Bet with someone that you can quit. Put cigarette money in a piggy bank every morning. If you smoke, you give up the money. If you do not smoke, by the end of the week, you keep the money.  Keep trying. It takes 21 days to change a habit!  Talk to your doctor about using medicines to help you quit. These include nicotine replacement gum, lozenges, or skin patches.   This information is not intended to replace advice given to you by your health care provider. Make sure you discuss any questions you have with your health care provider.   Document Released: 10/30/2008 Document Revised: 03/28/2011 Document Reviewed: 10/30/2008 Elsevier Interactive Patient Education 2016 [employer]  Shoulder Range of Motion Exercises Shoulder range of motion (ROM) exercises are designed to keep the shoulder moving freely. They are often recommended for people who have shoulder pain. MOVEMENT EXERCISE When you are able, do this exercise 5-6 days per week, or as told by your health care provider. Work toward doing 2 sets of 10 swings. Pendulum Exercise How To Do This Exercise Lying Down 1. Lie face-down on a bed with your abdomen close to   the side of the bed. 2. Let your arm hang over the side of the bed. 3. Relax your shoulder, arm, and hand. 4. Slowly and gently swing your arm forward and back. Do not use your neck muscles to swing your arm. They should be relaxed. If you are struggling to swing your arm, have someone gently swing it for you. When you do this exercise for the first time, swing your arm at a 15 degree angle for 15 seconds, or swing your arm 10 times. As pain lessens over time, increase the angle of the swing to 30-45 degrees. 5. Repeat steps 1-4 with the other arm. How To Do This Exercise While Standing 1. Stand  next to a sturdy chair or table and hold on to it with your hand.  Bend forward at the waist.  Bend your knees slightly.  Relax your other arm and let it hang limp.  Relax the shoulder blade of the arm that is hanging and let it drop.  While keeping your shoulder relaxed, use body motion to swing your arm in small circles. The first time you do this exercise, swing your arm for about 30 seconds or 10 times. When you do it next time, swing your arm for a little longer.  Stand up tall and relax.  Repeat steps 1-7, this time changing the direction of the circles. 2. Repeat steps 1-8 with the other arm. STRETCHING EXERCISES Do these exercises 3-4 times per day on 5-6 days per week or as told by your health care provider. Work toward holding the stretch for 20 seconds. Stretching Exercise 1 1. Lift your arm straight out in front of you. 2. Bend your arm 90 degrees at the elbow (right angle) so your forearm goes across your body and looks like the letter "L." 3. Use your other arm to gently pull the elbow forward and across your body. 4. Repeat steps 1-3 with the other arm. Stretching Exercise 2 You will need a towel or rope for this exercise. 1. Bend one arm behind your back with the palm facing outward. 2. Hold a towel with your other hand. 3. Reach the arm that holds the towel above your head, and bend that arm at the elbow. Your wrist should be behind your neck. 4. Use your free hand to grab the free end of the towel. 5. With the higher hand, gently pull the towel up behind you. 6. With the lower hand, pull the towel down behind you. 7. Repeat steps 1-6 with the other arm. STRENGTHENING EXERCISES Do each of these exercises at four different times of day (sessions) every day or as told by your health care provider. To begin with, repeat each exercise 5 times (repetitions). Work toward doing 3 sets of 12 repetitions or as told by your health care provider. Strengthening Exercise 1 You  will need a light weight for this activity. As you grow stronger, you may use a heavier weight. 1. Standing with a weight in your hand, lift your arm straight out to the side until it is at the same height as your shoulder. 2. Bend your arm at 90 degrees so that your fingers are pointing to the ceiling. 3. Slowly raise your hand until your arm is straight up in the air. 4. Repeat steps 1-3 with the other arm. Strengthening Exercise 2 You will need a light weight for this activity. As you grow stronger, you may use a heavier weight. 1. Standing with a weight in your   hand, gradually move your straight arm in an arc, starting at your side, then out in front of you, then straight up over your head. 2. Gradually move your other arm in an arc, starting at your side, then out in front of you, then straight up over your head. 3. Repeat steps 1-2 with the other arm. Strengthening Exercise 3 You will need an elastic band for this activity. As you grow stronger, gradually increase the size of the bands or increase the number of bands that you use at one time. 1. While standing, hold an elastic band in one hand and raise that arm up in the air. 2. With your other hand, pull down the band until that hand is by your side. 3. Repeat steps 1-2 with the other arm.   This information is not intended to replace advice given to you by your health care provider. Make sure you discuss any questions you have with your health care provider.   Document Released: 10/02/2002 Document Revised: 05/20/2014 Document Reviewed: 12/30/2013 Elsevier Interactive Patient Education 2016 Elsevier Inc.  

## 2015-10-01 ENCOUNTER — Other Ambulatory Visit: Payer: Self-pay | Admitting: Cardiovascular Disease

## 2015-10-15 ENCOUNTER — Encounter: Payer: Self-pay | Admitting: Cardiovascular Disease

## 2015-10-15 ENCOUNTER — Ambulatory Visit (INDEPENDENT_AMBULATORY_CARE_PROVIDER_SITE_OTHER): Payer: Medicare Other | Admitting: Cardiovascular Disease

## 2015-10-15 VITALS — BP 124/50 | HR 66 | Ht 69.0 in | Wt 143.0 lb

## 2015-10-15 DIAGNOSIS — I350 Nonrheumatic aortic (valve) stenosis: Secondary | ICD-10-CM | POA: Diagnosis not present

## 2015-10-15 DIAGNOSIS — I351 Nonrheumatic aortic (valve) insufficiency: Secondary | ICD-10-CM

## 2015-10-15 DIAGNOSIS — I2510 Atherosclerotic heart disease of native coronary artery without angina pectoris: Secondary | ICD-10-CM

## 2015-10-15 DIAGNOSIS — I10 Essential (primary) hypertension: Secondary | ICD-10-CM

## 2015-10-15 DIAGNOSIS — E785 Hyperlipidemia, unspecified: Secondary | ICD-10-CM

## 2015-10-15 NOTE — Patient Instructions (Signed)
Your physician wants you to follow-up in:  1 year with Dr Reggy Eyekoneswaran You will receive a reminder letter in the mail two months in advance. If you don't receive a letter, please call our office to schedule the follow-up appointment.     Your physician recommends that you continue on your current medications as directed. Please refer to the Current Medication list given to you today.   Your physician has requested that you have an echocardiogram. Echocardiography is a painless test that uses sound waves to create images of your heart. It provides your doctor with information about the size and shape of your heart and how well your heart's chambers and valves are working. This procedure takes approximately one hour. There are no restrictions for this procedure.     Thank you for choosing [facility] !
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
## 2015-10-15 NOTE — Addendum Note (Signed)
Addended by: Marlyn CorporalARLTON, Rykin Route A on: 10/15/2015 04:20 PM   Modules accepted: Orders

## 2015-10-19 ENCOUNTER — Telehealth: Payer: Self-pay | Admitting: Orthopedic Surgery

## 2015-10-19 ENCOUNTER — Other Ambulatory Visit: Payer: Self-pay | Admitting: *Deleted

## 2015-10-19 MED ORDER — HYDROCODONE-ACETAMINOPHEN 5-325 MG PO TABS: 1.0000 | ORAL_TABLET | Freq: Four times a day (QID) | ORAL | 0 refills | Status: DC | PRN

## 2015-10-19 NOTE — Telephone Encounter (Signed)
Patient requests a refill on Hydrocodone/Acetaminophen (Norco)  5-325 mgs.  Qty  110   Sig: Take 1 tablet by mouth every 6 (six) hours as needed for moderate pain (Must last 30 days.Do not take and drive a car or use machinery.).

## 2015-10-20 DIAGNOSIS — Z23 Encounter for immunization: Secondary | ICD-10-CM | POA: Diagnosis not present

## 2015-10-21 ENCOUNTER — Ambulatory Visit (HOSPITAL_COMMUNITY): Payer: Medicare Other | Attending: Cardiovascular Disease

## 2015-11-24 ENCOUNTER — Encounter: Payer: Self-pay | Admitting: Orthopaedic Surgery

## 2015-11-24 ENCOUNTER — Ambulatory Visit (INDEPENDENT_AMBULATORY_CARE_PROVIDER_SITE_OTHER): Payer: Medicare Other | Admitting: Orthopaedic Surgery

## 2015-11-24 VITALS — BP 141/52 | HR 65 | Temp 97.3°F | Ht 69.0 in | Wt 145.0 lb

## 2015-11-24 DIAGNOSIS — M25512 Pain in left shoulder: Secondary | ICD-10-CM

## 2015-11-24 DIAGNOSIS — I2510 Atherosclerotic heart disease of native coronary artery without angina pectoris: Secondary | ICD-10-CM

## 2015-11-24 DIAGNOSIS — F17210 Nicotine dependence, cigarettes, uncomplicated: Secondary | ICD-10-CM | POA: Diagnosis not present

## 2015-11-24 DIAGNOSIS — G8929 Other chronic pain: Secondary | ICD-10-CM

## 2015-11-24 DIAGNOSIS — F17200 Nicotine dependence, unspecified, uncomplicated: Secondary | ICD-10-CM | POA: Diagnosis not present

## 2015-11-24 DIAGNOSIS — I350 Nonrheumatic aortic (valve) stenosis: Secondary | ICD-10-CM | POA: Diagnosis not present

## 2015-11-24 MED ORDER — HYDROCODONE-ACETAMINOPHEN 5-325 MG PO TABS: 1.0000 | ORAL_TABLET | Freq: Four times a day (QID) | ORAL | 0 refills | Status: DC | PRN

## 2015-11-24 MED ORDER — NAPROXEN 500 MG PO TABS: 500.0000 mg | ORAL_TABLET | Freq: Two times a day (BID) | ORAL | 5 refills | Status: DC

## 2015-11-24 NOTE — Progress Notes (Signed)
Patient HY:QMVHQI:[name], male DOB:[birth year], 65 y.o. ONG:[id number]  Chief Complaint  Patient presents with  . Follow-up    Left shoulder pain    HPI  [name] is a 65 y.o. male who has chronic pain of the left shoulder.  He has no acute injury.  He has no redness or swelling or paresthesias.  He is doing his exercises and taking his medicine. HPI  Body mass index is 21.41 kg/m.  ROS  Review of Systems  HENT: Negative for congestion.   Respiratory: Negative for cough and shortness of breath.   Cardiovascular: Negative for chest pain and leg swelling.  Endocrine: Positive for cold intolerance.  Musculoskeletal: Positive for arthralgias.  Allergic/Immunologic: Positive for environmental allergies.    Past Medical History:  Diagnosis Date  . Diabetes mellitus   . MI (myocardial infarction)   . S/P AKA (above knee amputation) (HCC)     Past Surgical History:  Procedure Laterality Date  . CORONARY ANGIOPLASTY WITH STENT PLACEMENT    . LEG SURGERY     Repair of left leg trauma    History reviewed. No pertinent family history.  Social History Social History  Substance Use Topics  . Smoking status: Current Every Day Smoker    Packs/day: 0.30    Types: Cigars, Cigarettes    Start date: 01/18/1956  . Smokeless tobacco: Never Used  . Alcohol use No    No Known Allergies  Current Outpatient Prescriptions  Medication Sig Dispense Refill  . aspirin 81 MG chewable tablet Chew 81 mg by mouth daily.    [name]. HYDROcodone-acetaminophen (NORCO/VICODIN) 5-325 MG tablet Take 1 tablet by mouth every 6 (six) hours as needed for moderate pain (Must last 30 days.Do not take and drive a car or use machinery.). 110 tablet 0  . lisinopril (PRINIVIL,ZESTRIL) 5 MG tablet Take 1 tablet (5 mg total) by mouth daily. 30 tablet 6  . metoprolol tartrate (LOPRESSOR) 25 MG tablet TAKE 1/2 TABLET BY MOUTH TWICE DAILY 60 tablet 3  . nitroGLYCERIN (NITROSTAT) 0.4 MG SL tablet Place 0.4 mg  under the tongue every 5 (five) minutes as needed. Chest pain.    . simvastatin (ZOCOR) 40 MG tablet Take 40 mg by mouth at bedtime.      . naproxen (NAPROSYN) 500 MG tablet Take 1 tablet (500 mg total) by mouth 2 (two) times daily with a meal. 60 tablet 5   No current facility-administered medications for this visit.      Physical Exam  Blood pressure (!) 141/52, pulse 65, temperature 97.3 F (36.3 C), height 5\' 9"  (1.753 m), weight 145 lb (65.8 kg).  Constitutional: overall normal hygiene, normal nutrition, well developed, normal grooming, normal body habitus. Assistive device:none  Musculoskeletal: gait and station Limp none, muscle tone and strength are normal, no tremors or atrophy is present.  .  Neurological: coordination overall normal.  Deep tendon reflex/nerve stretch intact.  Sensation normal.  Cranial nerves II-XII intact.   Skin:   Normal overall no scars, lesions, ulcers or rashes. No psoriasis.  Psychiatric: Alert and oriented x 3.  Recent memory intact, remote memory unclear.  Normal mood and affect. Well groomed.  Good eye contact.  Cardiovascular: overall no swelling, no varicosities, no edema bilaterally, normal temperatures of the legs and arms, no clubbing, cyanosis and good capillary refill.  Lymphatic: palpation is normal.  Examination of left Upper Extremity is done.  Inspection:   Overall:  Elbow non-tender without crepitus or defects, forearm non-tender without  crepitus or defects, wrist non-tender without crepitus or defects, hand non-tender.    Shoulder: with glenohumeral joint tenderness, without effusion.   Upper arm: without swelling and tenderness   Range of motion:   Overall:  Full range of motion of the elbow, full range of motion of wrist and full range of motion in fingers.   Shoulder:  left  180 degrees forward flexion; 165 degrees abduction; 35 degrees internal rotation, 35 degrees external rotation, 20 degrees extension, 40 degrees  adduction.   Stability:   Overall:  Shoulder, elbow and wrist stable   Strength and Tone:   Overall full shoulder muscles strength, full upper arm strength and normal upper arm bulk and tone.  The patient has been educated about the nature of the problem(s) and counseled on treatment options.  The patient appeared to understand what I have discussed and is in agreement with it.  Encounter Diagnoses  Name Primary?  . Chronic left shoulder pain Yes  . Aortic valve stenosis, etiology of cardiac valve disease unspecified   . Tobacco smoker within last 12 months   . Cigarette nicotine dependence without complication     PLAN Call if any problems.  Precautions discussed.  Continue current medications.   Return to clinic 3 months   Electronically Signed Darreld McleanWayne Miroslava Santellan, MD 11/7/20179:21 AM

## 2016-02-24 ENCOUNTER — Ambulatory Visit: Payer: Medicare Other | Admitting: Orthopaedic Surgery

## 2016-03-14 DIAGNOSIS — E119 Type 2 diabetes mellitus without complications: Secondary | ICD-10-CM | POA: Diagnosis not present

## 2016-03-21 DIAGNOSIS — E119 Type 2 diabetes mellitus without complications: Secondary | ICD-10-CM | POA: Diagnosis not present

## 2016-03-21 DIAGNOSIS — Z23 Encounter for immunization: Secondary | ICD-10-CM | POA: Diagnosis not present

## 2016-03-21 DIAGNOSIS — I2510 Atherosclerotic heart disease of native coronary artery without angina pectoris: Secondary | ICD-10-CM | POA: Diagnosis not present

## 2016-03-21 DIAGNOSIS — Z6825 Body mass index (BMI) 25.0-25.9, adult: Secondary | ICD-10-CM | POA: Diagnosis not present

## 2016-06-21 DIAGNOSIS — Z0100 Encounter for examination of eyes and vision without abnormal findings: Secondary | ICD-10-CM | POA: Diagnosis not present

## 2016-06-21 DIAGNOSIS — H5210 Myopia, unspecified eye: Secondary | ICD-10-CM | POA: Diagnosis not present

## 2016-07-04 ENCOUNTER — Other Ambulatory Visit: Payer: Self-pay | Admitting: Cardiovascular Disease

## 2016-07-19 DIAGNOSIS — E119 Type 2 diabetes mellitus without complications: Secondary | ICD-10-CM | POA: Diagnosis not present

## 2016-07-19 DIAGNOSIS — I2510 Atherosclerotic heart disease of native coronary artery without angina pectoris: Secondary | ICD-10-CM | POA: Diagnosis not present

## 2016-07-19 DIAGNOSIS — Z79899 Other long term (current) drug therapy: Secondary | ICD-10-CM | POA: Diagnosis not present

## 2016-07-19 DIAGNOSIS — Z125 Encounter for screening for malignant neoplasm of prostate: Secondary | ICD-10-CM | POA: Diagnosis not present

## 2016-07-19 DIAGNOSIS — M2010 Hallux valgus (acquired), unspecified foot: Secondary | ICD-10-CM | POA: Diagnosis not present

## 2016-07-26 DIAGNOSIS — E1129 Type 2 diabetes mellitus with other diabetic kidney complication: Secondary | ICD-10-CM | POA: Diagnosis not present

## 2016-07-26 DIAGNOSIS — E1165 Type 2 diabetes mellitus with hyperglycemia: Secondary | ICD-10-CM | POA: Diagnosis not present

## 2016-07-26 DIAGNOSIS — Z89612 Acquired absence of left leg above knee: Secondary | ICD-10-CM | POA: Diagnosis not present

## 2016-07-26 DIAGNOSIS — I2510 Atherosclerotic heart disease of native coronary artery without angina pectoris: Secondary | ICD-10-CM | POA: Diagnosis not present

## 2016-07-26 DIAGNOSIS — E785 Hyperlipidemia, unspecified: Secondary | ICD-10-CM | POA: Diagnosis not present

## 2016-07-29 DIAGNOSIS — E1165 Type 2 diabetes mellitus with hyperglycemia: Secondary | ICD-10-CM | POA: Diagnosis not present

## 2016-08-05 DIAGNOSIS — E1165 Type 2 diabetes mellitus with hyperglycemia: Secondary | ICD-10-CM | POA: Diagnosis not present

## 2016-08-22 DIAGNOSIS — E1165 Type 2 diabetes mellitus with hyperglycemia: Secondary | ICD-10-CM | POA: Diagnosis not present

## 2016-10-14 DIAGNOSIS — E1129 Type 2 diabetes mellitus with other diabetic kidney complication: Secondary | ICD-10-CM | POA: Diagnosis not present

## 2016-10-24 ENCOUNTER — Encounter: Payer: Self-pay | Admitting: Cardiovascular Disease

## 2016-10-24 ENCOUNTER — Ambulatory Visit (INDEPENDENT_AMBULATORY_CARE_PROVIDER_SITE_OTHER): Payer: Medicare Other | Admitting: Cardiovascular Disease

## 2016-10-24 VITALS — BP 142/50 | HR 68 | Ht 69.0 in | Wt 159.0 lb

## 2016-10-24 DIAGNOSIS — I351 Nonrheumatic aortic (valve) insufficiency: Secondary | ICD-10-CM

## 2016-10-24 DIAGNOSIS — I10 Essential (primary) hypertension: Secondary | ICD-10-CM | POA: Diagnosis not present

## 2016-10-24 DIAGNOSIS — E785 Hyperlipidemia, unspecified: Secondary | ICD-10-CM | POA: Diagnosis not present

## 2016-10-24 DIAGNOSIS — I25118 Atherosclerotic heart disease of native coronary artery with other forms of angina pectoris: Secondary | ICD-10-CM

## 2016-10-24 DIAGNOSIS — I350 Nonrheumatic aortic (valve) stenosis: Secondary | ICD-10-CM | POA: Diagnosis not present

## 2016-10-24 NOTE — Patient Instructions (Signed)
Medication Instructions:  Your physician recommends that you continue on your current medications as directed. Please refer to the Current Medication list given to you today.   Labwork: NONE   Testing/Procedures: Your physician has requested that you have an echocardiogram. Echocardiography is a painless test that uses sound waves to create images of your heart. It provides your doctor with information about the size and shape of your heart and how well your heart's chambers and valves are working. This procedure takes approximately one hour. There are no restrictions for this procedure.    Follow-Up: Your physician wants you to follow-up in: 1 Year with Dr. Koneswaran. You will receive a reminder letter in the mail two months in advance. If you don't receive a letter, please call our office to schedule the follow-up appointment.   Any Other Special Instructions Will Be Listed Below (If Applicable).     If you need a refill on your cardiac medications before your next appointment, please call your pharmacy. Thank you for choosing [facility]!    
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
## 2016-10-26 ENCOUNTER — Ambulatory Visit (HOSPITAL_COMMUNITY)
Admission: RE | Admit: 2016-10-26 | Discharge: 2016-10-26 | Disposition: A | Discharge status: Home / Self Care | Payer: Medicare Other | Source: Ambulatory Visit | Attending: Cardiovascular Disease | Admitting: Cardiovascular Disease

## 2016-10-26 DIAGNOSIS — E119 Type 2 diabetes mellitus without complications: Secondary | ICD-10-CM | POA: Diagnosis not present

## 2016-10-26 DIAGNOSIS — I350 Nonrheumatic aortic (valve) stenosis: Secondary | ICD-10-CM | POA: Diagnosis not present

## 2016-10-26 DIAGNOSIS — I080 Rheumatic disorders of both mitral and aortic valves: Secondary | ICD-10-CM | POA: Insufficient documentation

## 2016-10-26 DIAGNOSIS — E785 Hyperlipidemia, unspecified: Secondary | ICD-10-CM | POA: Diagnosis not present

## 2016-10-26 LAB — ECHOCARDIOGRAM COMPLETE
AV Area VTI index: 0.6 cm2/m2
AV Area VTI: 1.12 cm2
AV Mean grad: 41 mmHg
AV area mean vel ind: 0.62 cm2/m2
AV vel: 1.12
AVA: 1.12 cm2
AVAREAMEANV: 1.16 cm2
AVCELMEANRAT: 0.37
AVLVOTPG: 11 mmHg
AVPG: 86 mmHg
AVPKVEL: 463 cm/s
Ao pk vel: 0.36 m/s
CHL CUP AV PEAK INDEX: 0.6
CHL CUP AV VALUE AREA INDEX: 0.6
CHL CUP DOP CALC LVOT VTI: 40.3 cm
CHL CUP MV DEC (S): 229
CHL CUP RV SYS PRESS: 27 mmHg
CHL CUP STROKE VOLUME: 84 mL
CHL CUP TV REG PEAK VELOCITY: 243 cm/s
DOP CAL AO MEAN VELOCITY: 289 cm/s
E decel time: 229 msec
EERAT: 20.13
FS: 41 % (ref 28–44)
IV/PV OW: 0.98
LA ID, A-P, ES: 40 mm
LA diam end sys: 40 mm
LA vol A4C: 78.1 ml
LA vol index: 34.9 mL/m2
LA vol: 65.5 mL
LADIAMINDEX: 2.13 cm/m2
LV E/e' medial: 20.13
LV E/e'average: 20.13
LV PW d: 12.5 mm — AB (ref 0.6–1.1)
LV e' LATERAL: 6.31 cm/s
LV sys vol index: 27 mL/m2
LV sys vol: 50 mL (ref 21–61)
LVDIAVOL: 134 mL (ref 62–150)
LVDIAVOLIN: 71 mL/m2
LVOT area: 3.14 cm2
LVOT peak vel: 165 cm/s
LVOTD: 20 mm
LVOTSV: 127 mL
LVOTVTI: 0.36 cm
MV Peak grad: 6 mmHg
MV pk A vel: 159 m/s
MV pk E vel: 127 m/s
P 1/2 time: 375 ms
RV LATERAL S' VELOCITY: 9.36 cm/s
Simpson's disk: 63
TAPSE: 16.5 mm
TDI e' lateral: 6.31
TDI e' medial: 7.51
TR max vel: 243 cm/s
VTI: 113 cm

## 2016-10-26 NOTE — Progress Notes (Signed)
*  PRELIMINARY RESULTS* Echocardiogram 2D Echocardiogram has been performed.  Stacey Drain 10/26/2016, 1:52 PM

## 2016-10-27 ENCOUNTER — Telehealth: Payer: Self-pay

## 2016-10-27 DIAGNOSIS — I350 Nonrheumatic aortic (valve) stenosis: Secondary | ICD-10-CM

## 2016-10-27 NOTE — Telephone Encounter (Signed)
Ref placed to cardiology at [facility], pt made aware of results

## 2016-10-27 NOTE — Telephone Encounter (Signed)
-----   Message from Laqueta Linden, MD sent at 10/26/2016  4:18 PM EDT ----- He has severe aortic stenosis, although LV systolic function is normal. Difficult to assess exercise tolerance as he has an amputation and wears a prosthesis. Will make referral to valve clinic to obtain their opinion regarding timing and mode of valve replacement.
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
## 2016-11-04 ENCOUNTER — Encounter: Payer: Self-pay | Admitting: Cardiovascular Disease

## 2016-11-04 ENCOUNTER — Ambulatory Visit (INDEPENDENT_AMBULATORY_CARE_PROVIDER_SITE_OTHER): Payer: Medicare Other | Admitting: Cardiovascular Disease

## 2016-11-04 VITALS — BP 136/72 | HR 54 | Ht 69.0 in | Wt 160.4 lb

## 2016-11-04 DIAGNOSIS — I350 Nonrheumatic aortic (valve) stenosis: Secondary | ICD-10-CM

## 2016-11-04 LAB — BASIC METABOLIC PANEL
BUN / CREAT RATIO: 18 (ref 10–24)
BUN: 13 mg/dL (ref 8–27)
CALCIUM: 9.6 mg/dL (ref 8.6–10.2)
CHLORIDE: 106 mmol/L (ref 96–106)
CO2: 21 mmol/L (ref 20–29)
Creatinine, Ser: 0.73 mg/dL — ABNORMAL LOW (ref 0.76–1.27)
GFR calc Af Amer: 113 mL/min/{1.73_m2} (ref >59)
GFR calc non Af Amer: 97 mL/min/{1.73_m2} (ref >59)
Glucose: 107 mg/dL — ABNORMAL HIGH (ref 65–99)
POTASSIUM: 4.9 mmol/L (ref 3.5–5.2)
SODIUM: 141 mmol/L (ref 134–144)

## 2016-11-04 LAB — CBC WITH DIFFERENTIAL/PLATELET
BASOS: 0 %
Basophils Absolute: 0 10*3/uL (ref 0.0–0.2)
EOS (ABSOLUTE): 0.1 10*3/uL (ref 0.0–0.4)
EOS: 1 %
HEMOGLOBIN: 13.6 g/dL (ref 13.0–17.7)
Hematocrit: 40 % (ref 37.5–51.0)
IMMATURE GRANS (ABS): 0 10*3/uL (ref 0.0–0.1)
IMMATURE GRANULOCYTES: 0 %
Lymphocytes Absolute: 2.6 10*3/uL (ref 0.7–3.1)
Lymphs: 38 %
MCH: 28.9 pg (ref 26.6–33.0)
MCHC: 34 g/dL (ref 31.5–35.7)
MCV: 85 fL (ref 79–97)
MONOCYTES: 13 %
Monocytes Absolute: 0.9 10*3/uL (ref 0.1–0.9)
NEUTROS ABS: 3.3 10*3/uL (ref 1.4–7.0)
NEUTROS PCT: 48 %
Platelets: 217 10*3/uL (ref 150–379)
RBC: 4.71 x10E6/uL (ref 4.14–5.80)
RDW: 13.7 % (ref 12.3–15.4)
WBC: 6.8 10*3/uL (ref 3.4–10.8)

## 2016-11-04 LAB — PROTIME-INR
INR: 1 (ref 0.8–1.2)
Prothrombin Time: 10.6 s (ref 9.1–12.0)

## 2016-11-04 NOTE — Patient Instructions (Signed)
Medication Instructions:  Your physician recommends that you continue on your current medications as directed. Please refer to the Current Medication list given to you today.   Labwork: Lab work to be done today-BMP, CBC, PT  Testing/Procedures: None at this time  Follow-Up:  Call us if you would like to schedule cardiac catheterization  Any Other Special Instructions Will Be Listed Below (If Applicable).     If you need a refill on your cardiac medications before your next appointment, please call your pharmacy.

## 2016-11-07 ENCOUNTER — Telehealth: Payer: Self-pay | Admitting: Cardiovascular Disease

## 2016-11-07 ENCOUNTER — Encounter: Payer: Self-pay | Admitting: *Deleted

## 2016-11-07 NOTE — Telephone Encounter (Signed)
New message     Pt is calling about scheduling procedure.

## 2016-11-07 NOTE — Telephone Encounter (Signed)
I spoke with pt. He would like to proceed with cath on 11/14/16.  He cannot arrive before 10 AM.   Cath arranged for noon on 11/14/16.  I verbally went over cath instructions with pt and will mail copy to him at address listed in chart.

## 2016-11-08 NOTE — Addendum Note (Signed)
Addended by: Verne CarrowMCALHANY, CHRISTOPHER D on: 11/08/2016 01:33 PM   Modules accepted: Orders, SmartSet

## 2016-11-11 ENCOUNTER — Telehealth: Payer: Self-pay

## 2016-11-11 DIAGNOSIS — E1129 Type 2 diabetes mellitus with other diabetic kidney complication: Secondary | ICD-10-CM | POA: Diagnosis not present

## 2016-11-11 NOTE — Telephone Encounter (Signed)
Attempted outreach to Pt.  Call went to message-message states Pt VM has not been set up.  Unable to reach Pt.

## 2016-11-14 ENCOUNTER — Ambulatory Visit (HOSPITAL_COMMUNITY)
Admission: RE | Admit: 2016-11-14 | Discharge: 2016-11-14 | Disposition: A | Discharge status: Home / Self Care | Payer: Medicare Other | Source: Ambulatory Visit | Attending: Cardiovascular Disease | Admitting: Cardiovascular Disease

## 2016-11-14 ENCOUNTER — Encounter (HOSPITAL_COMMUNITY)
Admission: RE | Disposition: A | Discharge status: Home / Self Care | Payer: Self-pay | Source: Ambulatory Visit | Attending: Cardiovascular Disease

## 2016-11-14 DIAGNOSIS — Z7982 Long term (current) use of aspirin: Secondary | ICD-10-CM | POA: Insufficient documentation

## 2016-11-14 DIAGNOSIS — Z7984 Long term (current) use of oral hypoglycemic drugs: Secondary | ICD-10-CM | POA: Diagnosis not present

## 2016-11-14 DIAGNOSIS — Z87891 Personal history of nicotine dependence: Secondary | ICD-10-CM | POA: Insufficient documentation

## 2016-11-14 DIAGNOSIS — I350 Nonrheumatic aortic (valve) stenosis: Secondary | ICD-10-CM

## 2016-11-14 DIAGNOSIS — Z955 Presence of coronary angioplasty implant and graft: Secondary | ICD-10-CM | POA: Insufficient documentation

## 2016-11-14 DIAGNOSIS — I252 Old myocardial infarction: Secondary | ICD-10-CM | POA: Insufficient documentation

## 2016-11-14 DIAGNOSIS — I2582 Chronic total occlusion of coronary artery: Secondary | ICD-10-CM | POA: Insufficient documentation

## 2016-11-14 DIAGNOSIS — E119 Type 2 diabetes mellitus without complications: Secondary | ICD-10-CM | POA: Diagnosis not present

## 2016-11-14 DIAGNOSIS — I352 Nonrheumatic aortic (valve) stenosis with insufficiency: Secondary | ICD-10-CM | POA: Diagnosis not present

## 2016-11-14 DIAGNOSIS — Z89612 Acquired absence of left leg above knee: Secondary | ICD-10-CM | POA: Diagnosis not present

## 2016-11-14 DIAGNOSIS — I2510 Atherosclerotic heart disease of native coronary artery without angina pectoris: Secondary | ICD-10-CM | POA: Diagnosis not present

## 2016-11-14 DIAGNOSIS — E785 Hyperlipidemia, unspecified: Secondary | ICD-10-CM | POA: Insufficient documentation

## 2016-11-14 HISTORY — PX: RIGHT/LEFT HEART CATH AND CORONARY ANGIOGRAPHY: CATH118266

## 2016-11-14 LAB — POCT I-STAT 3, VENOUS BLOOD GAS (G3P V)
ACID-BASE DEFICIT: 1 mmol/L (ref 0.0–2.0)
Bicarbonate: 24.5 mmol/L (ref 20.0–28.0)
O2 Saturation: 57 %
PH VEN: 7.383 (ref 7.250–7.430)
TCO2: 26 mmol/L (ref 22–32)
pCO2, Ven: 41.1 mmHg — ABNORMAL LOW (ref 44.0–60.0)
pO2, Ven: 30 mmHg — CL (ref 32.0–45.0)

## 2016-11-14 LAB — POCT I-STAT 3, ART BLOOD GAS (G3+)
ACID-BASE DEFICIT: 3 mmol/L — AB (ref 0.0–2.0)
Bicarbonate: 20.9 mmol/L (ref 20.0–28.0)
O2 Saturation: 91 %
TCO2: 22 mmol/L (ref 22–32)
pCO2 arterial: 31.3 mmHg — ABNORMAL LOW (ref 32.0–48.0)
pH, Arterial: 7.433 (ref 7.350–7.450)
pO2, Arterial: 59 mmHg — ABNORMAL LOW (ref 83.0–108.0)

## 2016-11-14 LAB — POCT ACTIVATED CLOTTING TIME: ACTIVATED CLOTTING TIME: 164 s

## 2016-11-14 LAB — GLUCOSE, CAPILLARY
GLUCOSE-CAPILLARY: 98 mg/dL (ref 65–99)
GLUCOSE-CAPILLARY: 82 mg/dL (ref 65–99)
Glucose-Capillary: 61 mg/dL — ABNORMAL LOW (ref 65–99)

## 2016-11-14 SURGERY — RIGHT/LEFT HEART CATH AND CORONARY ANGIOGRAPHY
Anesthesia: LOCAL

## 2016-11-14 MED ORDER — MIDAZOLAM HCL 2 MG/2ML IJ SOLN
INTRAMUSCULAR | Status: AC
  Filled 2016-11-14: qty 2

## 2016-11-14 MED ORDER — HEPARIN SODIUM (PORCINE) 1000 UNIT/ML IJ SOLN
INTRAMUSCULAR | Status: AC
  Filled 2016-11-14: qty 1

## 2016-11-14 MED ORDER — MIDAZOLAM HCL 2 MG/2ML IJ SOLN
INTRAMUSCULAR | Status: DC | PRN
  Administered 2016-11-14: 1 mg via INTRAVENOUS

## 2016-11-14 MED ORDER — IOPAMIDOL (ISOVUE-370) INJECTION 76%
INTRAVENOUS | Status: AC
  Filled 2016-11-14: qty 100

## 2016-11-14 MED ORDER — SODIUM CHLORIDE 0.9 % IV SOLN: INTRAVENOUS | Status: AC

## 2016-11-14 MED ORDER — SODIUM CHLORIDE 0.9% FLUSH: 3.0000 mL | Freq: Two times a day (BID) | INTRAVENOUS | Status: DC

## 2016-11-14 MED ORDER — SODIUM CHLORIDE 0.9 % IV SOLN: 250.0000 mL | INTRAVENOUS | Status: DC | PRN

## 2016-11-14 MED ORDER — LIDOCAINE HCL 2 % IJ SOLN
INTRAMUSCULAR | Status: DC | PRN
  Administered 2016-11-14: 15 mL

## 2016-11-14 MED ORDER — IOPAMIDOL (ISOVUE-370) INJECTION 76%
INTRAVENOUS | Status: AC
  Filled 2016-11-14: qty 50

## 2016-11-14 MED ORDER — HEPARIN (PORCINE) IN NACL 2-0.9 UNIT/ML-% IJ SOLN
INTRAMUSCULAR | Status: AC | PRN
  Administered 2016-11-14: 1000 mL

## 2016-11-14 MED ORDER — HEPARIN (PORCINE) IN NACL 2-0.9 UNIT/ML-% IJ SOLN
INTRAMUSCULAR | Status: AC
  Filled 2016-11-14: qty 1000

## 2016-11-14 MED ORDER — LIDOCAINE HCL 2 % IJ SOLN
INTRAMUSCULAR | Status: AC
  Filled 2016-11-14: qty 20

## 2016-11-14 MED ORDER — VERAPAMIL HCL 2.5 MG/ML IV SOLN
INTRAVENOUS | Status: AC
  Filled 2016-11-14: qty 2

## 2016-11-14 MED ORDER — HEPARIN SODIUM (PORCINE) 1000 UNIT/ML IJ SOLN
INTRAMUSCULAR | Status: DC | PRN
Start: 2016-11-14 — End: 2016-11-14
  Administered 2016-11-14: 3500 [IU] via INTRAVENOUS

## 2016-11-14 MED ORDER — SODIUM CHLORIDE 0.9% FLUSH: 3.0000 mL | INTRAVENOUS | Status: DC | PRN

## 2016-11-14 MED ORDER — ASPIRIN 81 MG PO CHEW: 81.0000 mg | CHEWABLE_TABLET | ORAL | Status: DC

## 2016-11-14 MED ORDER — IOPAMIDOL (ISOVUE-370) INJECTION 76%
INTRAVENOUS | Status: DC | PRN
Start: 2016-11-14 — End: 2016-11-14
  Administered 2016-11-14: 145 mL

## 2016-11-14 MED ORDER — SODIUM CHLORIDE 0.9 % IV SOLN
INTRAVENOUS | Status: AC
  Administered 2016-11-14: 11:00:00 via INTRAVENOUS

## 2016-11-14 MED ORDER — HEPARIN (PORCINE) IN NACL 2-0.9 UNIT/ML-% IJ SOLN
INTRAMUSCULAR | Status: DC | PRN
  Administered 2016-11-14: 10 mL via INTRA_ARTERIAL

## 2016-11-14 SURGICAL SUPPLY — 18 items
CATH IMPULSE 5F ANG/FL3.5 (CATHETERS) ×1 IMPLANT
CATH INFINITI 5 FR AL2 (CATHETERS) ×1 IMPLANT
CATH LAUNCHER 5F EBU3.0 (CATHETERS) IMPLANT
CATH SWAN GANZ 7F STRAIGHT (CATHETERS) ×1 IMPLANT
CATHETER LAUNCHER 5F EBU3.0 (CATHETERS) ×2
COVER PRB 48X5XTLSCP FOLD TPE (BAG) IMPLANT
COVER PROBE 5X48 (BAG) ×2
DEVICE RAD COMP TR BAND LRG (VASCULAR PRODUCTS) ×1 IMPLANT
GLIDESHEATH SLEND SS 6F .021 (SHEATH) ×1 IMPLANT
GUIDEWIRE INQWIRE 1.5J.035X260 (WIRE) IMPLANT
INQWIRE 1.5J .035X260CM (WIRE) ×2
KIT HEART LEFT (KITS) ×2 IMPLANT
PACK CARDIAC CATHETERIZATION (CUSTOM PROCEDURE TRAY) ×2 IMPLANT
SHEATH GLIDE SLENDER 4/5FR (SHEATH) ×1 IMPLANT
SHEATH PINNACLE 7F 10CM (SHEATH) ×1 IMPLANT
TRANSDUCER W/STOPCOCK (MISCELLANEOUS) ×2 IMPLANT
TUBING CIL FLEX 10 FLL-RA (TUBING) ×2 IMPLANT
WIRE EMERALD ST .035X150CM (WIRE) ×1 IMPLANT
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
## 2016-11-14 NOTE — Discharge Instructions (Signed)
Hold metformin 48 hours post cath.   Radial Site Care Refer to this sheet in the next few weeks. These instructions provide you with information about caring for yourself after your procedure. Your health care provider may also give you more specific instructions. Your treatment has been planned according to current medical practices, but problems sometimes occur. Call your health care provider if you have any problems or questions after your procedure. What can I expect after the procedure? After your procedure, it is typical to have the following:  Bruising at the radial site that usually fades within 1-2 weeks.  Blood collecting in the tissue (hematoma) that may be painful to the touch. It should usually decrease in size and tenderness within 1-2 weeks.  Follow these instructions at home:  Take medicines only as directed by your health care provider.  You may shower 24-48 hours after the procedure or as directed by your health care provider. Remove the bandage (dressing) and gently wash the site with plain soap and water. Pat the area dry with a clean towel. Do not rub the site, because this may cause bleeding.  Do not take baths, swim, or use a hot tub until your health care provider approves.  Check your insertion site every day for redness, swelling, or drainage.  Do not apply powder or lotion to the site.  Do not flex or bend the affected arm for 24 hours or as directed by your health care provider.  Do not push or pull heavy objects with the affected arm for 24 hours or as directed by your health care provider.  Do not lift over 10 lb (4.5 kg) for 5 days after your procedure or as directed by your health care provider.  Ask your health care provider when it is okay to: ? Return to work or school. ? Resume usual physical activities or sports. ? Resume sexual activity.  Do not drive home if you are discharged the same day as the procedure. Have someone else drive you.  You  may drive 24 hours after the procedure unless otherwise instructed by your health care provider.  Do not operate machinery or power tools for 24 hours after the procedure.  If your procedure was done as an outpatient procedure, which means that you went home the same day as your procedure, a responsible adult should be with you for the first 24 hours after you arrive home.  Keep all follow-up visits as directed by your health care provider. This is important. Contact a health care provider if:  You have a fever.  You have chills.  You have increased bleeding from the radial site. Hold pressure on the site. Get help right away if:  You have unusual pain at the radial site.  You have redness, warmth, or swelling at the radial site.  You have drainage (other than a small amount of blood on the dressing) from the radial site.  The radial site is bleeding, and the bleeding does not stop after 30 minutes of holding steady pressure on the site.  Your arm or hand becomes pale, cool, tingly, or numb. This information is not intended to replace advice given to you by your health care provider. Make sure you discuss any questions you have with your health care provider. Document Released: 02/05/2010 Document Revised: 06/11/2015 Document Reviewed: 07/22/2013 Elsevier Interactive Patient Education  2018 [employer]   Femoral Site Care Refer to this sheet in the next few weeks. These instructions provide you  with information about caring for yourself after your procedure. Your health care provider may also give you more specific instructions. Your treatment has been planned according to current medical practices, but problems sometimes occur. Call your health care provider if you have any problems or questions after your procedure. What can I expect after the procedure? After your procedure, it is typical to have the following:  Bruising at the site that usually fades within 1-2  weeks.  Blood collecting in the tissue (hematoma) that may be painful to the touch. It should usually decrease in size and tenderness within 1-2 weeks.  Follow these instructions at home:  Take medicines only as directed by your health care provider.  You may shower 24-48 hours after the procedure or as directed by your health care provider. Remove the bandage (dressing) and gently wash the site with plain soap and water. Pat the area dry with a clean towel. Do not rub the site, because this may cause bleeding.  Do not take baths, swim, or use a hot tub until your health care provider approves.  Check your insertion site every day for redness, swelling, or drainage.  Do not apply powder or lotion to the site.  Limit use of stairs to twice a day for the first 2-3 days or as directed by your health care provider.  Do not squat for the first 2-3 days or as directed by your health care provider.  Do not lift over 10 lb (4.5 kg) for 5 days after your procedure or as directed by your health care provider.  Ask your health care provider when it is okay to: ? Return to work or school. ? Resume usual physical activities or sports. ? Resume sexual activity.  Do not drive home if you are discharged the same day as the procedure. Have someone else drive you.  You may drive 24 hours after the procedure unless otherwise instructed by your health care provider.  Do not operate machinery or power tools for 24 hours after the procedure or as directed by your health care provider.  If your procedure was done as an outpatient procedure, which means that you went home the same day as your procedure, a responsible adult should be with you for the first 24 hours after you arrive home.  Keep all follow-up visits as directed by your health care provider. This is important. Contact a health care provider if:  You have a fever.  You have chills.  You have increased bleeding from the site. Hold  pressure on the site. Get help right away if:  You have unusual pain at the site.  You have redness, warmth, or swelling at the site.  You have drainage (other than a small amount of blood on the dressing) from the site.  The site is bleeding, and the bleeding does not stop after 30 minutes of holding steady pressure on the site.  Your leg or foot becomes pale, cool, tingly, or numb. This information is not intended to replace advice given to you by your health care provider. Make sure you discuss any questions you have with your health care provider. Document Released: 09/06/2013 Document Revised: 06/11/2015 Document Reviewed: 07/23/2013 Elsevier Interactive Patient Education  [employer].

## 2016-11-14 NOTE — Progress Notes (Signed)
RT groin femoral venous sheath pulled and manual pressure held for . RT PT dopplered pre and post sheath pull. RT groin site level 0 pre and post sheath pull. Instructions explained to patient. Gauze and tegaderm dressing applied. Bedrest starts at 1434. No complications.

## 2016-11-14 NOTE — Interval H&P Note (Signed)
History and Physical Interval Note:  11/14/2016 12:27 PM  [name]  has presented today for cardiac cath with the diagnosis of severe aortic stenosis. The various methods of treatment have been discussed with the patient and family. After consideration of risks, benefits and other options for treatment, the patient has consented to  Procedure(s): RIGHT/LEFT HEART CATH AND CORONARY ANGIOGRAPHY (N/A) as a surgical intervention .  The patient's history has been reviewed, patient examined, no change in status, stable for surgery.  I have reviewed the patient's chart and labs.  Questions were answered to the patient's satisfaction.    Cath Lab Visit (complete for each Cath Lab visit)  Clinical Evaluation Leading to the Procedure:   ACS: No.  Non-ACS:    Anginal Classification: CCS III  Anti-ischemic medical therapy: Minimal Therapy (1 class of medications)  Non-Invasive Test Results: No non-invasive testing performed  Prior CABG: No previous CABG         Verne Carrowhristopher Ivey Cina

## 2016-11-15 ENCOUNTER — Encounter (HOSPITAL_COMMUNITY): Payer: Self-pay | Admitting: Cardiovascular Disease

## 2016-11-16 ENCOUNTER — Other Ambulatory Visit: Payer: Self-pay | Admitting: *Deleted

## 2016-11-16 DIAGNOSIS — I350 Nonrheumatic aortic (valve) stenosis: Secondary | ICD-10-CM

## 2016-11-17 ENCOUNTER — Institutional Professional Consult (permissible substitution) (INDEPENDENT_AMBULATORY_CARE_PROVIDER_SITE_OTHER): Payer: Medicare Other | Admitting: Surgery

## 2016-11-17 ENCOUNTER — Encounter: Payer: Self-pay | Admitting: Surgery

## 2016-11-17 VITALS — BP 138/56 | HR 70 | Resp 16 | Ht 69.0 in | Wt 160.0 lb

## 2016-11-17 DIAGNOSIS — I350 Nonrheumatic aortic (valve) stenosis: Secondary | ICD-10-CM

## 2016-11-17 DIAGNOSIS — I2510 Atherosclerotic heart disease of native coronary artery without angina pectoris: Secondary | ICD-10-CM | POA: Diagnosis not present

## 2016-11-17 NOTE — Progress Notes (Signed)
Cardiothoracic Surgery Consultation  PCP is Carylon Perches, MD Referring Provider is Kathleene Hazel*  Chief Complaint  Patient presents with  . Aortic Stenosis    SEVERE...eval for conventional surgery or TAVR...ECHO 10/26/16, CATH 11/14/16  . 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  He has been followed by Dr. Purvis Sheffield and his most recent echocardiogram on 10/26/2016 showed severe aortic stenosis with a mean gradient of 41 mmHg with a peak gradient of 86 mmHg.  The aortic valve area was calculated at 1.12 cm.  There was moderate aortic insufficiency and moderate mitral regurgitation.  The patient says that a few weeks ago he developed brief on and off episodes of chest pain that was mild with no associated symptoms.  This resolved and he has had no recurrence.  He denies any shortness of breath with exertion or fatigue.  He says that he has been chopping wood over the past couple weeks without any difficulty and his energy level has been good.  He denies any dizziness or syncope.  He has had no peripheral edema.  He was seen by Dr. Clifton James and underwent cardiac catheterization on 11/14/2016 showing mild nonobstructive coronary disease in the right coronary artery and LAD territories.  There was a chronic total occlusion of a mid left circumflex stent.  The mean gradient across aortic valve was measured at 31 mmHg with a peak to peak gradient of 27 mmHg and an aortic valve area of 0.62 cm.  His cardiac index was 2.12.  Right heart pressures were normal.  Past Medical History:  Diagnosis Date  . CAD (coronary artery disease)   . Diabetes mellitus   . Hyperlipidemia   . MI (myocardial infarction) (HCC)   . S/P AKA (above knee amputation) (HCC)     Past Surgical  History:  Procedure Laterality Date  . CORONARY ANGIOPLASTY WITH STENT PLACEMENT    . LEG SURGERY     Repair of left leg trauma  . RIGHT/LEFT HEART CATH AND CORONARY ANGIOGRAPHY N/A 11/14/2016   Procedure: RIGHT/LEFT HEART CATH AND CORONARY ANGIOGRAPHY;  Surgeon: Kathleene Hazel, MD;  Location: [facility];  Service: Cardiovascular;  Laterality: N/A;    Family History  Problem Relation Age of Onset  . Hypertension Mother   . Heart attack Father     Social History Social History  Substance Use Topics  . Smoking status: Former Smoker    Packs/day: 0.30    Types: Cigars, Cigarettes    Start date: 01/18/1956    Quit date: 11/25/2015  . Smokeless tobacco: Never Used  . Alcohol use 0.0 oz/week     Comment: Occasional    Current Outpatient Prescriptions  Medication Sig Dispense Refill  . aspirin 81 MG chewable tablet Chew 81 mg by mouth daily.    [name] HYDROcodone-acetaminophen (NORCO/VICODIN) 5-325 MG tablet Take 1 tablet by mouth every 6 (six) hours as needed for moderate pain (Must last 30 days.Do not take and drive a car or use machinery.). 110 tablet 0  . Insulin Degludec (TRESIBA FLEXTOUCH) 200 UNIT/ML SOPN Inject 5 Units into the skin at bedtime.    [name] lisinopril (PRINIVIL,ZESTRIL) 5 MG tablet Take 1 tablet (5 mg total) by mouth daily. 30 tablet 6  . metFORMIN (GLUCOPHAGE) 1000 MG tablet Take 1,000  mg by mouth 2 (two) times daily with a meal.    . metoprolol tartrate (LOPRESSOR) 25 MG tablet TAKE 1/2 TABLET BY MOUTH TWICE DAILY 60 tablet 0  . naproxen (NAPROSYN) 500 MG tablet Take 1 tablet (500 mg total) by mouth 2 (two) times daily with a meal. (Patient taking differently: Take 220 mg by mouth 2 (two) times daily with a meal. ) 60 tablet 5  . naproxen sodium (ANAPROX) 220 MG tablet Take 220 mg by mouth 2 (two) times daily as needed (for pain.).    [name] nitroGLYCERIN (NITROSTAT) 0.4 MG SL tablet Place 0.4 mg under the tongue every 5 (five) minutes as needed. Chest pain.      . simvastatin (ZOCOR) 40 MG tablet Take 40 mg by mouth at bedtime.      . tamsulosin (FLOMAX) 0.4 MG CAPS capsule Take 0.4 mg by mouth daily.      No current facility-administered medications for this visit.     No Known Allergies  Review of Systems  Constitutional: Negative for activity change, appetite change, fatigue and unexpected weight change.  HENT: Positive for dental problem.        Has loose teeth and has not seen dentist in 5 years.  Eyes: Negative.   Respiratory: Negative for cough, chest tightness and shortness of breath.   Cardiovascular: Negative for chest pain, palpitations and leg swelling.  Gastrointestinal: Negative.   Endocrine: Negative.   Genitourinary: Negative.   Musculoskeletal: Negative.   Skin: Negative.   Allergic/Immunologic: Negative.   Neurological: Negative for dizziness, syncope and weakness.  Hematological: Negative.   Psychiatric/Behavioral: Negative.     BP (!) 138/56 (BP Location: Left Arm, Patient Position: Sitting, Cuff Size: Large)   Pulse 70   Resp 16   Ht 5\' 9"  (1.753 m)   Wt 160 lb (72.6 kg)   SpO2 94% Comment: ON RA  BMI 23.63 kg/m  Physical Exam  Constitutional: He is oriented to person, place, and time. He appears well-developed and well-nourished. No distress.  HENT:  Head: Normocephalic and atraumatic.  Poor dentition with multiple missing and loose teeth.  Eyes: Pupils are equal, round, and reactive to light. EOM are normal.  Neck: Normal range of motion. Neck supple. No JVD present. No thyromegaly present.  Cardiovascular: Normal rate, regular rhythm, normal heart sounds and intact distal pulses.   No murmur heard. Pulmonary/Chest: Effort normal and breath sounds normal. No respiratory distress.  Abdominal: Soft. Bowel sounds are normal. He exhibits no distension and no mass. There is no tenderness.  Musculoskeletal: Normal range of motion. He exhibits deformity. He exhibits no edema.  Left AKA with prosthesis.   Lymphadenopathy:    He has no cervical adenopathy.  Neurological: He is alert and oriented to person, place, and time. He has normal strength. No cranial nerve deficit or sensory deficit.  Skin: Skin is warm and dry.  Psychiatric: He has a normal mood and affect.     Diagnostic Tests:     *[facility]*                         [address]                        [place], [place] [address]  [phone]  ------------------------------------------------------------------- Transthoracic Echocardiography  Patient:    Keron, Neenan MR #:       [id number] Study Date: 10/26/2016 Gender:     M Age:        65 Height:     175.3 cm Weight:     72.1 kg BSA:        1.88 m^2 Pt. Status: Room:   ATTENDING    Prentice Docker, MD  ORDERING     Prentice Docker, MD  REFERRING    Prentice Docker, MD  PERFORMING   [facility], [facility]king  SONOGRAPHER  Celesta Gentile, RCS  cc:  ------------------------------------------------------------------- LV EF: 60% -   65%  ------------------------------------------------------------------- Indications:      Aortic stenosis 424.1.  ------------------------------------------------------------------- History:   PMH:  Acquired from the patient and from the patient&'s chart.  Murmur.  Risk factors:  Diabetes mellitus. Dyslipidemia.   ------------------------------------------------------------------- Study Conclusions  - Left ventricle: The cavity size was mildly dilated. Wall   thickness was increased in a pattern of mild LVH. Systolic   function was normal. The estimated ejection fraction was in the   range of 60% to 65%. Wall motion was normal; there were no   regional wall motion abnormalities. Doppler parameters are   consistent with abnormal left ventricular relaxation (grade 1   diastolic dysfunction). - Aortic valve: Heavily calcified aortic leaflets. There was severe   stenosis. There  was moderate regurgitation. Mean gradient (S): 41   mm Hg. Peak gradient (S): 86 mm Hg. VTI ratio of LVOT to aortic   valve: 0.36. Valve area (VTI): 1.12 cm^2. - Mitral valve: Mildly calcified annulus. Mildly thickened, mildly   calcified leaflets . There was moderate regurgitation. - Left atrium: The atrium was moderately dilated. - Right atrium: Central venous pressure (est): 3 mm Hg. - Atrial septum: No defect or patent foramen ovale was identified. - Tricuspid valve: There was trivial regurgitation. - Pulmonary arteries: PA peak pressure: 27 mm Hg (S). - Pericardium, extracardiac: There was no pericardial effusion.  Impressions:  - Mild LVH with mild chamber dilatation and LVEF 60-65%. Grade 1   diastolic dysfunction. Moderate left atrial enlargement. Mildly   thickened and calcified mitral leaflets with moderate mitral   regurgitation. Heavily calcified aortic valve with evidence of   overall severe aortic stenosis and moderate aortic regurgitation   as outlined above. Valve gradients have increased in comparison   to the previous study. Trivial tricuspid regurgitation with   estimated PASP 27 mmHg.  ------------------------------------------------------------------- Study data:  Comparison was made to the study of 06/29/2016.  Study status:  Routine.  Procedure:  The patient reported no pain pre or post test. Transthoracic echocardiography. Image quality was adequate.  Study completion:  There were no complications. Transthoracic echocardiography.  M-mode, complete 2D, spectral Doppler, and color Doppler.  Birthdate:  Patient birthdate: 1950-11-23.  Age:  Patient is 66 yr old.  Sex:  Gender: male. BMI: 23.5 kg/m^2.  Blood pressure:     171/69  Patient status: Inpatient.  Study date:  Study date: 10/26/2016. Study time: 01:01 PM.  Location:  Echo  laboratory.  -------------------------------------------------------------------  ------------------------------------------------------------------- Left ventricle:  The cavity size was mildly dilated. Wall thickness was increased in a pattern of mild LVH. Systolic function was normal. The estimated ejection fraction was in the range of 60% to 65%. Wall motion was normal; there were no regional wall motion abnormalities. Doppler parameters are consistent with abnormal left ventricular relaxation (grade 1 diastolic dysfunction).  -------------------------------------------------------------------  Aortic valve:  Heavily calcified aortic leaflets.  Doppler:   There was severe stenosis.   There was moderate regurgitation.    VTI ratio of LVOT to aortic valve: 0.36. Valve area (VTI): 1.12 cm^2. Indexed valve area (VTI): 0.6 cm^2/m^2. Peak velocity ratio of LVOT to aortic valve: 0.36. Valve area (Vmax): 1.12 cm^2. Indexed valve area (Vmax): 0.6 cm^2/m^2. Mean velocity ratio of LVOT to aortic valve: 0.37. Valve area (Vmean): 1.16 cm^2. Indexed valve area (Vmean): 0.62 cm^2/m^2.    Mean gradient (S): 41 mm Hg. Peak gradient (S): 86 mm Hg.  ------------------------------------------------------------------- Aorta:  Aortic root: The aortic root was normal in size.  ------------------------------------------------------------------- Mitral valve:   Mildly calcified annulus. Mildly thickened, mildly calcified leaflets .  Doppler:  There was moderate regurgitation.  Peak gradient (D): 6 mm Hg.  ------------------------------------------------------------------- Left atrium:  The atrium was moderately dilated.  ------------------------------------------------------------------- Atrial septum:  No defect or patent foramen ovale was identified.   ------------------------------------------------------------------- Right ventricle:  The cavity size was normal. Systolic function  was normal.  ------------------------------------------------------------------- Pulmonic valve:    The valve appears to be grossly normal. Doppler:  There was trivial regurgitation.  ------------------------------------------------------------------- Tricuspid valve:   The valve appears to be grossly normal. Doppler:  There was trivial regurgitation.  ------------------------------------------------------------------- Right atrium:  The atrium was normal in size.  ------------------------------------------------------------------- Pericardium:  There was no pericardial effusion.  ------------------------------------------------------------------- Systemic veins: Inferior vena cava: The vessel was normal in size. The respirophasic diameter changes were in the normal range (>= 50%), consistent with normal central venous pressure.  ------------------------------------------------------------------- Measurements   Left ventricle                           Value          Reference  LV ID, ED, PLAX chordal          (H)     54    mm       43 - 52  LV ID, ES, PLAX chordal                  32    mm       23 - 38  LV fx shortening, PLAX chordal           41    %        >=29  LV PW thickness, ED                      12.5  mm       ----------  IVS/LV PW ratio, ED                      0.98           <=1.3  Stroke volume, 2D                        127   ml       ----------  Stroke volume/bsa, 2D                    68    ml/m^2   ----------  LV ejection fraction, 1-p A4C            66    %        ----------  LV end-diastolic volume, 2-p  134   ml       ----------  LV end-systolic volume, 2-p              50    ml       ----------  LV ejection fraction, 2-p                63    %        ----------  Stroke volume, 2-p                       84    ml       ----------  LV end-diastolic volume/bsa, 2-p         71    ml/m^2   ----------  LV end-systolic volume/bsa, 2-p          27     ml/m^2   ----------  Stroke volume/bsa, 2-p                   44.8  ml/m^2   ----------  LV e&', lateral                           6.31  cm/s     ----------  LV E/e&', lateral                         20.13          ----------  LV e&', medial                            7.51  cm/s     ----------  LV E/e&', medial                          16.91          ----------  LV e&', average                           6.91  cm/s     ----------  LV E/e&', average                         18.38          ----------    Ventricular septum                       Value          Reference  IVS thickness, ED                        12.3  mm       ----------    LVOT                                     Value          Reference  LVOT ID, S                               20    mm       ----------  LVOT area  3.14  cm^2     ----------  LVOT peak velocity, S                    165   cm/s     ----------  LVOT mean velocity, S                    107   cm/s     ----------  LVOT VTI, S                              40.3  cm       ----------  LVOT peak gradient, S                    11    mm Hg    ----------    Aortic valve                             Value          Reference  Aortic valve peak velocity, S            463   cm/s     ----------  Aortic valve mean velocity, S            289   cm/s     ----------  Aortic valve VTI, S                      113   cm       ----------  Aortic mean gradient, S                  41    mm Hg    ----------  Aortic peak gradient, S                  86    mm Hg    ----------  VTI ratio, LVOT/AV                       0.36           ----------  Aortic valve area, VTI                   1.12  cm^2     ----------  Aortic valve area/bsa, VTI               0.6   cm^2/m^2 ----------  Velocity ratio, peak, LVOT/AV            0.36           ----------  Aortic valve area, peak velocity         1.12  cm^2     ----------  Aortic valve area/bsa, peak              0.6    cm^2/m^2 ----------  velocity  Velocity ratio, mean, LVOT/AV            0.37           ----------  Aortic valve area, mean velocity         1.16  cm^2     ----------  Aortic valve area/bsa, mean              0.62  cm^2/m^2 ----------  velocity  Aortic regurg pressure half-time  375   ms       ----------    Aorta                                    Value          Reference  Aortic root ID, ED                       35    mm       ----------    Left atrium                              Value          Reference  LA ID, A-P, ES                           40    mm       ----------  LA ID/bsa, A-P                           2.13  cm/m^2   <=2.2  LA volume, S                             65.5  ml       ----------  LA volume/bsa, S                         34.9  ml/m^2   ----------  LA volume, ES, 1-p A4C                   78.1  ml       ----------  LA volume/bsa, ES, 1-p A4C               41.6  ml/m^2   ----------  LA volume, ES, 1-p A2C                   48.7  ml       ----------  LA volume/bsa, ES, 1-p A2C               25.9  ml/m^2   ----------    Mitral valve                             Value          Reference  Mitral E-wave peak velocity              127   cm/s     ----------  Mitral A-wave peak velocity              159   cm/s     ----------  Mitral deceleration time                 229   ms       150 - 230  Mitral peak gradient, D                  6     mm Hg    ----------  Mitral E/A ratio, peak  0.8            ----------    Pulmonary arteries                       Value          Reference  PA pressure, S, DP                       27    mm Hg    <=30    Tricuspid valve                          Value          Reference  Tricuspid regurg peak velocity           243   cm/s     ----------  Tricuspid peak RV-RA gradient            24    mm Hg    ----------    Right atrium                             Value          Reference  RA ID, S-I, ES, A4C                       44.7  mm       34 - 49  RA area, ES, A4C                         13.8  cm^2     8.3 - 19.5  RA volume, ES, A/L                       35.9  ml       ----------  RA volume/bsa, ES, A/L                   19.1  ml/m^2   ----------    Systemic veins                           Value          Reference  Estimated CVP                            3     mm Hg    ----------    Right ventricle                          Value          Reference  RV ID, ED, PLAX                          31.8  mm       19 - 38  TAPSE                                    16.5  mm       ----------  RV pressure, S, DP  27    mm Hg    <=30  RV s&', lateral, S                        9.36  cm/s     ----------  Legend: (L)  and  (H)  mark values outside specified reference range.  ------------------------------------------------------------------- Prepared and Electronically Authenticated by  Nona Dell, M.D. 2018-10-10T15:57:17  [facility]  CARDIAC CATHETERIZATION  Order# [id number]  Reading physician: Kathleene Hazel, MD Ordering physician: Kathleene Hazel, MD Study date: 11/14/16  Physicians   Panel Physicians Referring Physician Case Authorizing Physician  Kathleene Hazel, MD (Primary)    Procedures   RIGHT/LEFT HEART CATH AND CORONARY ANGIOGRAPHY  Conclusion     Mid RCA lesion, 40 %stenosed.  Dist RCA lesion, 30 %stenosed.  Ost RPDA lesion, 20 %stenosed.  Ost LM lesion, 20 %stenosed.  Mid Cx lesion, 100 %stenosed.  There is severe aortic valve stenosis.  Mid LAD to Dist LAD lesion, 10 %stenosed.   1. Mild non-obstructive disease in the RCA, LAD and intermediate branches.  2. 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  3. Severe aortic valve stenosis (mean gradient 31 mmHg, peak to peak gradient 27 mmHg, AVA 0.62 cm2)  Recommendations: will continue planning for AVR. He will be seen in our CT surgery office. I think  the surgical consult should be obtained before we perform CT scans as he may need surgical AVR.    Indications   Severe aortic stenosis [I35.0 (ICD-10-CM)]  Procedural Details/Technique   Technical Details Indication: 66 yo male with history of CAD, DM and severe aortic stenosis, moderate AI, moderate MR with recent worsened chest pressure/pain.   Procedure: The risks, benefits, complications, treatment options, and expected outcomes were discussed with the patient. The patient and/or family concurred with the proposed plan, giving informed consent. The patient was brought to the cath lab after IV hydration was given. The patient was further sedated with Versed. The right groin was prepped and draped. A 7 French sheath was placed in the right femoral vein under u/s guidance. Balloon tipped catheter used to perform right heart catheterization. The right wrist was prepped and draped in a sterile fashion. 1% lidocaine was used for local anesthesia. Using the modified Seldinger access technique, a 5 French sheath was placed in the right radial artery. 3 mg Verapamil was given through the sheath. 3500 units IV heparin was given. Standard diagnostic catheters were used to perform selective coronary angiography. I engaged the RCA with a JR4 catheter. I engaged the left main artery with an AL-2 catheter. I crossed the aortic valve with an AL-2 catheter and a straight wire. LV pressures measured. No LV gram performed. The sheath was removed from the right radial artery and a Terumo hemostasis band was applied at the arteriotomy site on the right wrist.    Estimated blood loss <50 mL.  During this procedure the patient was administered the following to achieve and maintain moderate conscious sedation: Versed 1 mg, while the patient's heart rate, blood pressure, and oxygen saturation were continuously monitored. The period of conscious sedation was 50 minutes, of which I was present face-to-face 100% of this  time.    Complications   Complications documented before study signed (11/14/2016 1:51 PM EDT)    RIGHT/LEFT HEART CATH AND CORONARY ANGIOGRAPHY   None Documented by Kathleene Hazel, MD 11/14/2016 1:50 PM EDT  Time Range: Intra-procedure  Coronary Findings   Dominance: Right  Left Main  Ost LM lesion, 20% stenosed.  Left Anterior Descending  Vessel is large.  Mid LAD to Dist LAD lesion, 10% stenosed.  First Diagonal Branch  Vessel is moderate in size.  First Septal Branch  Vessel is small in size.  Second Diagonal Branch  Vessel is small in size.  Second Septal Branch  Vessel is small in size.  Third Diagonal Branch  Vessel is small in size.  Third Septal Branch  Vessel is small in size.  Ramus Intermedius  Vessel is moderate in size.  Left Circumflex  Mid Cx lesion, 100% stenosed. The lesion is chronically occluded. The lesion was previously treated using a bare metal stent over 2 years ago.  Second Obtuse Marginal Branch  Vessel is small in size.  Right Coronary Artery  Mid RCA lesion, 40% stenosed.  Dist RCA lesion, 30% stenosed.  Right Posterior Descending Artery  Ost RPDA lesion, 20% stenosed.  Right Heart   Right Heart Pressures Elevated LV EDP consistent with volume overload.    Left Heart   Aortic Valve There is severe aortic valve stenosis. The aortic valve is calcified.    Coronary Diagrams   Diagnostic Diagram       Implants     No implant documentation for this case.  MERGE Images   Show images for CARDIAC CATHETERIZATION   Link to Procedure Log   Procedure Log    Hemo Data    Most Recent Value  Fick Cardiac Output 3.98 L/min  Fick Cardiac Output Index 2.12 (L/min)/BSA  Aortic Mean Gradient 31.1 mmHg  Aortic Peak Gradient 27 mmHg  Aortic Valve Area 0.62  Aortic Value Area Index 0.33 cm2/BSA  RA A Wave 8 mmHg  RA V Wave 6 mmHg  RA Mean 5 mmHg  RV Systolic Pressure 44 mmHg  RV Diastolic Pressure 4 mmHg  RV EDP 8  mmHg  PA Systolic Pressure 37 mmHg  PA Diastolic Pressure 15 mmHg  PA Mean 22 mmHg  PW A Wave 17 mmHg  PW V Wave 13 mmHg  PW Mean 12 mmHg  AO Systolic Pressure 119 mmHg  AO Diastolic Pressure 46 mmHg  AO Mean 72 mmHg  LV Systolic Pressure 179 mmHg  LV Diastolic Pressure 13 mmHg  LV EDP 22 mmHg  Arterial Occlusion Pressure Extended Systolic Pressure 151 mmHg  Arterial Occlusion Pressure Extended Diastolic Pressure 51 mmHg  Arterial Occlusion Pressure Extended Mean Pressure 87 mmHg  Left Ventricular Apex Extended Systolic Pressure 178 mmHg  Left Ventricular Apex Extended Diastolic Pressure 10 mmHg  Left Ventricular Apex Extended EDP Pressure 23 mmHg  QP/QS 1  TPVR Index 10.4 HRUI  TSVR Index 34.04 HRUI  PVR SVR Ratio 0.15  TPVR/TSVR Ratio 0.31    Impression:  This 66 year old gentleman has stage C asymptomatic severe aortic stenosis with moderate aortic insufficiency and moderate mitral regurgitation.  I reviewed the symptoms of aortic stenosis multiple times with him and he denies any symptoms other than the brief episodes of chest pain that he had one day a few weeks ago that have resolved and not returned.  He has been chopping wood without any difficulty.  I have personally reviewed and interpreted his recent echocardiogram and cardiac catheterization.  His left ventricular systolic function is normal but he does have grade 1 diastolic dysfunction and his left ventricular internal dimension during diastole has increased to 54 mm.  His mitral valve has some calcification and thickening of the  leaflets.  There is fluttering of the anterior leaflet during diastole related to his aortic insufficiency and I wonder if that is interfering with closure of the valve.  His mitral regurgitation looks more mild to me.  His cardiac catheterization shows nonobstructive coronary disease with a chronically occluded left circumflex stent.  He does not require any coronary bypasses.  I think the best  option for him is to have his valve replaced before he has progressive left ventricular deterioration given his combination of severe aortic stenosis and moderate aortic insufficiency.  I do not think he would be a candidate for TAVR since his operative risk is low and he is only 66 years old.  I discussed this with him and he seems to understand the reasoning.  I discussed open surgical aortic valve replacement with him and my recommendation for using a bioprosthetic valve.  He will need a dental evaluation preoperatively.   Plan:  He will be referred to Dr. Robin Searing for dental evaluation and I will see him back in the office after that to discuss surgery further and answer any questions.  He would like to wait until later in December or January surgery.   I spent 60 minutes performing this consultation and > 50% of this time was spent face to face counseling and coordinating the care of this patient's severe aortic stenosis and moderate aortic insufficiency.   Alleen Borne, MD [facility] [phone]

## 2016-11-18 ENCOUNTER — Other Ambulatory Visit: Payer: Self-pay

## 2016-11-18 DIAGNOSIS — Z89612 Acquired absence of left leg above knee: Secondary | ICD-10-CM | POA: Diagnosis not present

## 2016-11-18 DIAGNOSIS — E1129 Type 2 diabetes mellitus with other diabetic kidney complication: Secondary | ICD-10-CM | POA: Diagnosis not present

## 2016-11-18 DIAGNOSIS — I350 Nonrheumatic aortic (valve) stenosis: Secondary | ICD-10-CM | POA: Diagnosis not present

## 2016-11-23 ENCOUNTER — Ambulatory Visit (HOSPITAL_COMMUNITY): Payer: Medicare Other | Admitting: Dentistry

## 2016-11-23 ENCOUNTER — Encounter (INDEPENDENT_AMBULATORY_CARE_PROVIDER_SITE_OTHER): Payer: Self-pay

## 2016-11-23 ENCOUNTER — Encounter (HOSPITAL_COMMUNITY): Payer: Self-pay | Admitting: Dentistry

## 2016-11-23 VITALS — BP 133/36 | HR 53 | Temp 98.4°F

## 2016-11-23 DIAGNOSIS — K0530 Chronic periodontitis, unspecified: Secondary | ICD-10-CM | POA: Diagnosis not present

## 2016-11-23 DIAGNOSIS — K08409 Partial loss of teeth, unspecified cause, unspecified class: Secondary | ICD-10-CM

## 2016-11-23 DIAGNOSIS — Z01818 Encounter for other preprocedural examination: Secondary | ICD-10-CM | POA: Diagnosis not present

## 2016-11-23 DIAGNOSIS — K0889 Other specified disorders of teeth and supporting structures: Secondary | ICD-10-CM

## 2016-11-23 DIAGNOSIS — K029 Dental caries, unspecified: Secondary | ICD-10-CM

## 2016-11-23 DIAGNOSIS — I350 Nonrheumatic aortic (valve) stenosis: Secondary | ICD-10-CM

## 2016-11-23 DIAGNOSIS — K06020 Generalized gingival recession, unspecified: Secondary | ICD-10-CM

## 2016-11-23 DIAGNOSIS — M264 Malocclusion, unspecified: Secondary | ICD-10-CM

## 2016-11-23 DIAGNOSIS — F40232 Fear of other medical care: Secondary | ICD-10-CM

## 2016-11-23 DIAGNOSIS — K06010 Localized gingival recession, unspecified: Secondary | ICD-10-CM | POA: Diagnosis not present

## 2016-11-23 DIAGNOSIS — K031 Abrasion of teeth: Secondary | ICD-10-CM

## 2016-11-23 DIAGNOSIS — K045 Chronic apical periodontitis: Secondary | ICD-10-CM

## 2016-11-23 DIAGNOSIS — K036 Deposits [accretions] on teeth: Secondary | ICD-10-CM

## 2016-11-23 NOTE — Progress Notes (Signed)
DENTAL CONSULTATION  Date of Consultation:  11/23/2016 Patient Name:   [name] Date of Birth:   Mar 26, 1950 Medical Record Number: [id number]  VITALS: BP (!) 133/36 (BP Location: Left Arm)   Pulse (!) 53   Temp 98.4 F (36.9 C) (Oral)   CHIEF COMPLAINT: Patient referred by Dr. Laneta SimmersBartle for dental consultation.  HPI: [name] is a 66 year old male recently diagnosed with severe aortic stenosis. Patient with anticipated aortic valve replacement with Dr. Laneta SimmersBartle. Patient is now seen as part of a medically necessary pre-heart valve surgery dental protocol examination to rule out dental infection that may affect the patient's systemic health and anticipated heart valve surgery.  A patient currently denies acute toothaches, swellings, or abscesses. Patient has not seen a dentist in over 7 years. Patient has several teeth pulled at that time with no complications. This was by a dentist in [place]. The patient denies having partial dentures. 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  PROBLEM LIST: Patient Active Problem List   Diagnosis Date Noted  . Severe aortic stenosis     Priority: High  . Aortic stenosis 04/29/2013  . Aortic regurgitation 04/29/2013  . Murmur, cardiac 11/22/2010  . DYSLIPIDEMIA 10/24/2008  . Coronary atherosclerosis 10/24/2008  . CAROTID BRUIT 10/24/2008  . DM 10/21/2008    PMH: Past Medical History:  Diagnosis Date  . CAD (coronary artery disease)   . Diabetes mellitus   . Hyperlipidemia   . MI (myocardial infarction) (HCC)   . S/P AKA (above knee amputation) (HCC)     PSH: Past Surgical History:  Procedure Laterality Date  . CORONARY ANGIOPLASTY WITH STENT PLACEMENT    . LEG SURGERY     Repair of left leg trauma    ALLERGIES: No Known Allergies  MEDICATIONS: Current Outpatient Medications  Medication Sig Dispense Refill  . aspirin 81 MG chewable tablet Chew 81 mg by mouth daily.    [name]. HYDROcodone-acetaminophen  (NORCO/VICODIN) 5-325 MG tablet Take 1 tablet by mouth every 6 (six) hours as needed for moderate pain (Must last 30 days.Do not take and drive a car or use machinery.). 110 tablet 0  . Insulin Degludec (TRESIBA FLEXTOUCH) 200 UNIT/ML SOPN Inject 5 Units into the skin at bedtime.    [name]. lisinopril (PRINIVIL,ZESTRIL) 5 MG tablet Take 1 tablet (5 mg total) by mouth daily. 30 tablet 6  . metFORMIN (GLUCOPHAGE) 1000 MG tablet Take 1,000 mg by mouth 2 (two) times daily with a meal.    . metoprolol tartrate (LOPRESSOR) 25 MG tablet TAKE 1/2 TABLET BY MOUTH TWICE DAILY 60 tablet 0  . naproxen sodium (ANAPROX) 220 MG tablet Take 220 mg by mouth 2 (two) times daily as needed (for pain.).    [name]. simvastatin (ZOCOR) 40 MG tablet Take 40 mg by mouth at bedtime.      . tamsulosin (FLOMAX) 0.4 MG CAPS capsule Take 0.4 mg by mouth daily.     . naproxen (NAPROSYN) 500 MG tablet Take 1 tablet (500 mg total) by mouth 2 (two) times daily with a meal. (Patient not taking: Reported on 11/23/2016) 60 tablet 5  . nitroGLYCERIN (NITROSTAT) 0.4 MG SL tablet Place 0.4 mg under the tongue every 5 (five) minutes as needed. Chest pain.     No current facility-administered medications for this visit.     LABS: Lab Results  Component Value Date   WBC 6.8 11/04/2016   HGB 13.6 11/04/2016   HCT 40.0 11/04/2016   MCV 85 11/04/2016  PLT 217 11/04/2016      Component Value Date/Time   NA 141 11/04/2016 1012   K 4.9 11/04/2016 1012   CL 106 11/04/2016 1012   CO2 21 11/04/2016 1012   GLUCOSE 107 (H) 11/04/2016 1012   GLUCOSE 95 02/03/2009 1250   BUN 13 11/04/2016 1012   CREATININE 0.73 (L) 11/04/2016 1012   CALCIUM 9.6 11/04/2016 1012   GFRNONAA 97 11/04/2016 1012   GFRAA 113 11/04/2016 1012   Lab Results  Component Value Date   INR 1.0 11/04/2016   No results found for: PTT  SOCIAL HISTORY: Social History   Socioeconomic History  . Marital status: Single    Spouse name: Not on file  . Number of children: 0   . Years of education: Not on file  . Highest education level: Not on file  Social Needs  . Financial resource strain: Not on file  . Food insecurity - worry: Not on file  . Food insecurity - inability: Not on file  . Transportation needs - medical: Not on file  . Transportation needs - non-medical: Not on file  Occupational History  . Occupation: Disabled since 1989  Tobacco Use  . Smoking status: Former Smoker    Packs/day: 0.30    Types: Cigars, Cigarettes    Start date: 01/18/1956    Last attempt to quit: 11/25/2015    Years since quitting: 0.9  . Smokeless tobacco: Never Used  Substance and Sexual Activity  . Alcohol use: Yes    Alcohol/week: 0.0 oz    Comment: Occasional  . Drug use: No  . Sexual activity: No  Other Topics Concern  . Not on file  Social History Narrative   No regular exercise     FAMILY HISTORY: Family History  Problem Relation Age of Onset  . Hypertension Mother   . Heart attack Father     REVIEW OF SYSTEMS: Reviewed with the patient as per History of present illness. Psych: Patient does have a history of dental phobia.  DENTAL HISTORY: CHIEF COMPLAINT: Patient referred by Dr. Laneta Simmers for dental consultation.  HPI: [name] is a 66 year old male recently diagnosed with severe aortic stenosis. Patient with anticipated aortic valve replacement with Dr. Laneta Simmers. Patient is now seen as part of a medically necessary pre-heart valve surgery dental protocol examination to rule out dental infection that may affect the patient's systemic health and anticipated heart valve surgery.  A patient currently denies acute toothaches, swellings, or abscesses. Patient has not seen a dentist in over 7 years. Patient has several teeth pulled at that time with no complications. This was by a dentist in [place]. The patient denies having partial dentures. 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  DENTAL EXAMINATION: GENERAL: The patient is a  well-developed, well-nourished male in no acute distress. The patient walks with the aid of a left leg prosthesis after his above knee amputation. HEAD AND NECK: There is no palpable neck lymphadenopathy. The patient denies acute TMJ symptoms. INTRAORAL EXAM: The patient has normal saliva. There is no evidence of oral abscess formation. DENTITION: The patient has multiple missing teeth numbers 5, 18, 23, and 31. There is a retained root in the area of #25. PERIODONTAL: The patient has chronic periodontal disease with plaque and calculus accumulations, generalized gingival recession, and tooth mobility. There is moderate to severe bone loss. Radiographic calculus is noted. DENTAL CARIES/SUBOPTIMAL RESTORATIONS: Dental caries are noted as per dental charting form. There may be other dental  caries noted after calculus is removed in the future. Multiple flexure lesions are noted. ENDODONTIC:  The patient denies acute pulpitis symptoms. Patient does have periapical pathology associated with tooth #24 and retained root tip #25. CROWN AND BRIDGE: There are no crown or bridge restorations. PROSTHODONTIC: The patient denies having partial dentures. OCCLUSION: The patient has a poor occlusal scheme secondary to multiple missing teeth, supra-eruption and drifting of the unopposed teeth into the edentulous areas, significant overjet, and lack of replacement of the missing teeth with dental prostheses.  RADIOGRAPHIC INTERPRETATION: Orthopantogram was taken today. This was supplemented with a full series of dental radiographs. There are multiple missing teeth. There is a retained root tip in the area of #25.There is moderate to severe bone loss. Radiographic calculus is noted. There is supra-eruption and drifting of the unopposed teeth into the edentulous areas. There is periapical pathology associated with the apex of tooth numbers 24 and 25. Dental caries are noted.  ASSESSMENTS: 1. Severe aortic stenosis 2.  Pre-heart valve surgery dental protocol 3. Chronic apical periodontitis 4. Chronic periodontitis with bone loss 5. Tooth mobility 6. Generalized gingival recession 7. Accretions 8. Multiple flexure lesions 9. Dental caries 10. Multiple missing teeth 11. Retained root tip #25. 12. Supra-eruption and drifting of the unopposed teeth into the edentulous areas. 13. No history of partial dentures 14. Significant overjet 15. Poor occlusal scheme and malocclusion 16. Dental phobia 17. Risk for complications with anticipated invasive dental procedures in the operating room with general anesthesia up to and including death due to his overall cardiovascular compromise.   PLAN/RECOMMENDATIONS: 1. I discussed the risks, benefits, and complications of various treatment options with the patient in relationship to his medical and dental conditions, anticipated heart valve surgery, and risk for endocarditis. We discussed various treatment options to include no treatment, multiple extractions with alveoloplasty, pre-prosthetic surgery as indicated, periodontal therapy, dental restorations, root canal therapy, crown and bridge therapy, implant therapy, and replacement of missing teeth as indicated. The patient currently wishes to proceed with multiple extractions with alveoloplasty and gross debridement of remaining dentition in  the operating room with general anesthesia. This has been scheduled for Tuesday, 11/29/2016 at 9 AM [facility]. The patient will then schedule the aortic valve replacement with Dr. Laneta SimmersBartle after adequate healing from these dental procedures.  The patient will also need to follow-up with the general dentist of his choice for additional periodontal therapy, dental restorations, and evaluation for replacement of missing teeth after adequate healing.  The patient will require antibiotic premedication prior to invasive dental procedures as part of the American Heart Association  guidelines after the heart valve surgery.   2. Discussion of findings with medical team and coordination of future medical and dental care as needed.  I spent in excess of 120 minutes during the conduct of this consultation and >50% of this time involved direct face-to-face encounter for counseling and/or coordination of the patient's care.    Charlynne Panderonald F. Kulinski, DDS

## 2016-11-23 NOTE — Patient Instructions (Signed)
[place]    Department of Dental Medicine     DR. KULINSKI      HEART VALVES AND MOUTH CARE:  FACTS:   If you have any infection in your mouth, it can infect your heart valve.  If you heart valve is infected, you will be seriously ill.  Infections in the mouth can be SILENT and do not always cause pain.  Examples of infections in the mouth are gum disease, dental cavities, and abscesses.  Some possible signs of infection are: Bad breath, bleeding gums, or teeth that are sensitive to sweets, hot, and/or cold. There are many other signs as well.  WHAT YOU HAVE TO DO:   Brush your teeth after meals and at bedtime. Spend at least 2 minutes brushing well, especially behind your back teeth and all around your teeth that stand alone. Brush at the gumline also.  Do not go to bed without brushing your teeth and flossing.  If you gums bleed when you brush or floss, do NOT stop brushing or flossing. It usually means that your gums need more attention and better cleaning.   If your Dentist or Dr. Kulinski gave you a prescription mouthwash to use, make sure to use it as directed. If you run out of the medication, get a refill at the pharmacy.   If you were given any other medications or directions by your Dentist, please follow them. If you did not understand the directions or forget what you were told, please call. We will be happy to refresh her memory.  If you need antibiotics before dental procedures, make sure you take them one hour prior to every dental visit as directed.   Get a dental checkup every 4-6 months in order to keep your mouth healthy, or to find and treat any new infection. You will most likely need your teeth cleaned or gums treated at the same time.  If you are not able to come in for your scheduled appointment, call your Dentist as soon as possible to reschedule.  If you have a problem in between dental visits, call your Dentist.  

## 2016-11-28 ENCOUNTER — Encounter (HOSPITAL_COMMUNITY): Payer: Self-pay | Admitting: *Deleted

## 2016-11-28 ENCOUNTER — Other Ambulatory Visit: Payer: Self-pay

## 2016-11-28 NOTE — Progress Notes (Addendum)
Mr. [name] reports that CBG runs 90- 120. I instructed patient to check CBG after awaking and every 2 hours until arrival  to the hospital.  I Instructed patient if CBG is less than 70 to drink 1/2 cup of a clear juice. Recheck CBG in 15 minutes then call pre- op desk at [phone] for further instructions. " I know the routine.    Patient reports that he can not get here until around 7:30, "I told my doctor."  "My ride to pick me up can't get here until around 5 PM.  I informed the PACU.  Mr [name] denies chest pain or shortness of breath.
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
## 2016-11-29 ENCOUNTER — Ambulatory Visit (HOSPITAL_COMMUNITY): Payer: Medicare Other | Admitting: Vascular Surgery

## 2016-11-29 ENCOUNTER — Ambulatory Visit (HOSPITAL_COMMUNITY)
Admission: RE | Admit: 2016-11-29 | Discharge: 2016-11-29 | Disposition: A | Discharge status: Home / Self Care | Payer: Medicare Other | Source: Ambulatory Visit | Attending: Dentistry | Admitting: Dentistry

## 2016-11-29 ENCOUNTER — Encounter (HOSPITAL_COMMUNITY): Payer: Self-pay | Admitting: General Practice

## 2016-11-29 ENCOUNTER — Encounter (HOSPITAL_COMMUNITY)
Admission: RE | Disposition: A | Discharge status: Home / Self Care | Payer: Self-pay | Source: Ambulatory Visit | Attending: Dentistry

## 2016-11-29 DIAGNOSIS — K036 Deposits [accretions] on teeth: Secondary | ICD-10-CM

## 2016-11-29 DIAGNOSIS — Z7982 Long term (current) use of aspirin: Secondary | ICD-10-CM | POA: Diagnosis not present

## 2016-11-29 DIAGNOSIS — Z79899 Other long term (current) drug therapy: Secondary | ICD-10-CM | POA: Insufficient documentation

## 2016-11-29 DIAGNOSIS — K083 Retained dental root: Secondary | ICD-10-CM

## 2016-11-29 DIAGNOSIS — I2510 Atherosclerotic heart disease of native coronary artery without angina pectoris: Secondary | ICD-10-CM | POA: Diagnosis not present

## 2016-11-29 DIAGNOSIS — Z791 Long term (current) use of non-steroidal anti-inflammatories (NSAID): Secondary | ICD-10-CM | POA: Diagnosis not present

## 2016-11-29 DIAGNOSIS — E119 Type 2 diabetes mellitus without complications: Secondary | ICD-10-CM | POA: Diagnosis not present

## 2016-11-29 DIAGNOSIS — Z8249 Family history of ischemic heart disease and other diseases of the circulatory system: Secondary | ICD-10-CM | POA: Diagnosis not present

## 2016-11-29 DIAGNOSIS — I252 Old myocardial infarction: Secondary | ICD-10-CM | POA: Insufficient documentation

## 2016-11-29 DIAGNOSIS — E1165 Type 2 diabetes mellitus with hyperglycemia: Secondary | ICD-10-CM | POA: Diagnosis not present

## 2016-11-29 DIAGNOSIS — Z794 Long term (current) use of insulin: Secondary | ICD-10-CM | POA: Diagnosis not present

## 2016-11-29 DIAGNOSIS — I350 Nonrheumatic aortic (valve) stenosis: Secondary | ICD-10-CM

## 2016-11-29 DIAGNOSIS — Z87891 Personal history of nicotine dependence: Secondary | ICD-10-CM | POA: Insufficient documentation

## 2016-11-29 DIAGNOSIS — K045 Chronic apical periodontitis: Secondary | ICD-10-CM

## 2016-11-29 DIAGNOSIS — K0530 Chronic periodontitis, unspecified: Secondary | ICD-10-CM | POA: Diagnosis not present

## 2016-11-29 DIAGNOSIS — Z955 Presence of coronary angioplasty implant and graft: Secondary | ICD-10-CM | POA: Insufficient documentation

## 2016-11-29 DIAGNOSIS — Z89619 Acquired absence of unspecified leg above knee: Secondary | ICD-10-CM | POA: Insufficient documentation

## 2016-11-29 DIAGNOSIS — K0889 Other specified disorders of teeth and supporting structures: Secondary | ICD-10-CM | POA: Diagnosis not present

## 2016-11-29 DIAGNOSIS — I083 Combined rheumatic disorders of mitral, aortic and tricuspid valves: Secondary | ICD-10-CM | POA: Insufficient documentation

## 2016-11-29 HISTORY — PX: MULTIPLE EXTRACTIONS WITH ALVEOLOPLASTY: SHX5342

## 2016-11-29 HISTORY — DX: Cardiac murmur, unspecified: R01.1

## 2016-11-29 HISTORY — DX: Unspecified osteoarthritis, unspecified site: M19.90

## 2016-11-29 LAB — CBC
HCT: 45.2 % (ref 39.0–52.0)
Hemoglobin: 14.4 g/dL (ref 13.0–17.0)
MCH: 29.1 pg (ref 26.0–34.0)
MCHC: 31.9 g/dL (ref 30.0–36.0)
MCV: 91.3 fL (ref 78.0–100.0)
Platelets: 230 10*3/uL (ref 150–400)
RBC: 4.95 MIL/uL (ref 4.22–5.81)
RDW: 14.4 % (ref 11.5–15.5)
WBC: 6.2 10*3/uL (ref 4.0–10.5)

## 2016-11-29 LAB — BASIC METABOLIC PANEL
Anion gap: 6 (ref 5–15)
BUN: 8 mg/dL (ref 6–20)
CHLORIDE: 108 mmol/L (ref 101–111)
CO2: 24 mmol/L (ref 22–32)
CREATININE: 0.91 mg/dL (ref 0.61–1.24)
Calcium: 8.9 mg/dL (ref 8.9–10.3)
GFR calc Af Amer: >60 mL/min (ref >60)
GFR calc non Af Amer: >60 mL/min (ref >60)
GLUCOSE: 117 mg/dL — AB (ref 65–99)
POTASSIUM: 3.9 mmol/L (ref 3.5–5.1)
Sodium: 138 mmol/L (ref 135–145)

## 2016-11-29 LAB — GLUCOSE, CAPILLARY
GLUCOSE-CAPILLARY: 126 mg/dL — AB (ref 65–99)
Glucose-Capillary: 127 mg/dL — ABNORMAL HIGH (ref 65–99)

## 2016-11-29 LAB — HEMOGLOBIN A1C
Hgb A1c MFr Bld: 6.6 % — ABNORMAL HIGH (ref 4.8–5.6)
Mean Plasma Glucose: 142.72 mg/dL

## 2016-11-29 SURGERY — MULTIPLE EXTRACTION WITH ALVEOLOPLASTY
Anesthesia: General | Site: Mouth

## 2016-11-29 MED ORDER — ROCURONIUM BROMIDE 10 MG/ML (PF) SYRINGE
PREFILLED_SYRINGE | INTRAVENOUS | Status: DC | PRN
  Administered 2016-11-29: 40 mg via INTRAVENOUS

## 2016-11-29 MED ORDER — PHENYLEPHRINE HCL 10 MG/ML IJ SOLN
INTRAMUSCULAR | Status: DC | PRN
  Administered 2016-11-29 (×3): 40 ug via INTRAVENOUS

## 2016-11-29 MED ORDER — OXYCODONE-ACETAMINOPHEN 5-325 MG PO TABS: ORAL_TABLET | ORAL | 0 refills | Status: DC

## 2016-11-29 MED ORDER — SUGAMMADEX SODIUM 200 MG/2ML IV SOLN
INTRAVENOUS | Status: DC | PRN
  Administered 2016-11-29: 150 mg via INTRAVENOUS

## 2016-11-29 MED ORDER — LACTATED RINGERS IV SOLN: INTRAVENOUS | Status: DC

## 2016-11-29 MED ORDER — CEFAZOLIN SODIUM-DEXTROSE 2-4 GM/100ML-% IV SOLN
2.0000 g | Freq: Once | INTRAVENOUS | Status: AC
  Administered 2016-11-29: 2 g via INTRAVENOUS
  Filled 2016-11-29: qty 100

## 2016-11-29 MED ORDER — FENTANYL CITRATE (PF) 250 MCG/5ML IJ SOLN
INTRAMUSCULAR | Status: DC | PRN
  Administered 2016-11-29: 100 ug via INTRAVENOUS

## 2016-11-29 MED ORDER — BUPIVACAINE-EPINEPHRINE 0.5% -1:200000 IJ SOLN
INTRAMUSCULAR | Status: DC | PRN
  Administered 2016-11-29: 3.6 mL

## 2016-11-29 MED ORDER — FENTANYL CITRATE (PF) 100 MCG/2ML IJ SOLN: 25.0000 ug | INTRAMUSCULAR | Status: DC | PRN

## 2016-11-29 MED ORDER — OXYMETAZOLINE HCL 0.05 % NA SOLN
NASAL | Status: AC
  Filled 2016-11-29: qty 15

## 2016-11-29 MED ORDER — 0.9 % SODIUM CHLORIDE (POUR BTL) OPTIME
TOPICAL | Status: DC | PRN
  Administered 2016-11-29: 1000 mL

## 2016-11-29 MED ORDER — LIDOCAINE-EPINEPHRINE 2 %-1:100000 IJ SOLN
INTRAMUSCULAR | Status: AC
  Filled 2016-11-29: qty 6.8

## 2016-11-29 MED ORDER — FENTANYL CITRATE (PF) 250 MCG/5ML IJ SOLN
INTRAMUSCULAR | Status: AC
  Filled 2016-11-29: qty 5

## 2016-11-29 MED ORDER — MIDAZOLAM HCL 2 MG/2ML IJ SOLN
INTRAMUSCULAR | Status: AC
  Filled 2016-11-29: qty 2

## 2016-11-29 MED ORDER — HEMOSTATIC AGENTS (NO CHARGE) OPTIME
TOPICAL | Status: DC | PRN
  Administered 2016-11-29: 1 via TOPICAL

## 2016-11-29 MED ORDER — BUPIVACAINE-EPINEPHRINE (PF) 0.5% -1:200000 IJ SOLN
INTRAMUSCULAR | Status: AC
  Filled 2016-11-29: qty 3.6

## 2016-11-29 MED ORDER — LIDOCAINE 2% (20 MG/ML) 5 ML SYRINGE
INTRAMUSCULAR | Status: DC | PRN
  Administered 2016-11-29: 100 mg via INTRAVENOUS

## 2016-11-29 MED ORDER — PROPOFOL 10 MG/ML IV BOLUS
INTRAVENOUS | Status: DC | PRN
  Administered 2016-11-29: 150 mg via INTRAVENOUS
  Administered 2016-11-29: 50 mg via INTRAVENOUS

## 2016-11-29 MED ORDER — LACTATED RINGERS IV SOLN
INTRAVENOUS | Status: DC
  Administered 2016-11-29 (×2): via INTRAVENOUS

## 2016-11-29 MED ORDER — OXYCODONE-ACETAMINOPHEN 5-325 MG PO TABS: 1.0000 | ORAL_TABLET | ORAL | Status: DC | PRN

## 2016-11-29 MED ORDER — LIDOCAINE-EPINEPHRINE 2 %-1:100000 IJ SOLN
INTRAMUSCULAR | Status: DC | PRN
  Administered 2016-11-29: 7.2 mL via INTRADERMAL

## 2016-11-29 MED ORDER — DEXAMETHASONE SODIUM PHOSPHATE 10 MG/ML IJ SOLN
INTRAMUSCULAR | Status: DC | PRN
  Administered 2016-11-29: 4 mg via INTRAVENOUS

## 2016-11-29 MED ORDER — SUCCINYLCHOLINE CHLORIDE 200 MG/10ML IV SOSY
PREFILLED_SYRINGE | INTRAVENOUS | Status: DC | PRN
  Administered 2016-11-29: 120 mg via INTRAVENOUS

## 2016-11-29 MED ORDER — MIDAZOLAM HCL 2 MG/2ML IJ SOLN
INTRAMUSCULAR | Status: DC | PRN
  Administered 2016-11-29: 1 mg via INTRAVENOUS

## 2016-11-29 MED ORDER — PHENYLEPHRINE HCL 10 MG/ML IJ SOLN
INTRAVENOUS | Status: DC | PRN
  Administered 2016-11-29: 30 ug/min via INTRAVENOUS
  Administered 2016-11-29: 15 ug/min via INTRAVENOUS

## 2016-11-29 SURGICAL SUPPLY — 38 items
ALCOHOL 70% 16 OZ (MISCELLANEOUS) ×2 IMPLANT
ATTRACTOMAT 16X20 MAGNETIC DRP (DRAPES) ×2 IMPLANT
BLADE SURG 15 STRL LF DISP TIS (BLADE) ×2 IMPLANT
BLADE SURG 15 STRL SS (BLADE) ×4
COVER SURGICAL LIGHT HANDLE (MISCELLANEOUS) ×2 IMPLANT
GAUZE PACKING FOLDED 2  STR (GAUZE/BANDAGES/DRESSINGS) ×1
GAUZE PACKING FOLDED 2 STR (GAUZE/BANDAGES/DRESSINGS) ×1 IMPLANT
GAUZE SPONGE 4X4 12PLY STRL (GAUZE/BANDAGES/DRESSINGS) ×1 IMPLANT
GAUZE SPONGE 4X4 16PLY XRAY LF (GAUZE/BANDAGES/DRESSINGS) ×2 IMPLANT
GLOVE BIOGEL PI IND STRL 6 (GLOVE) ×1 IMPLANT
GLOVE BIOGEL PI INDICATOR 6 (GLOVE) ×1
GLOVE SURG ORTHO 8.0 STRL STRW (GLOVE) ×2 IMPLANT
GLOVE SURG SS PI 6.0 STRL IVOR (GLOVE) ×2 IMPLANT
GOWN STRL REUS W/ TWL LRG LVL3 (GOWN DISPOSABLE) ×1 IMPLANT
GOWN STRL REUS W/TWL 2XL LVL3 (GOWN DISPOSABLE) ×2 IMPLANT
GOWN STRL REUS W/TWL LRG LVL3 (GOWN DISPOSABLE) ×4
HEMOSTAT SURGICEL 2X14 (HEMOSTASIS) ×1 IMPLANT
KIT BASIN OR (CUSTOM PROCEDURE TRAY) ×2 IMPLANT
KIT ROOM TURNOVER OR (KITS) ×2 IMPLANT
MANIFOLD NEPTUNE WASTE (CANNULA) ×2 IMPLANT
NDL BLUNT 16X1.5 OR ONLY (NEEDLE) ×1 IMPLANT
NEEDLE BLUNT 16X1.5 OR ONLY (NEEDLE) ×2 IMPLANT
NS IRRIG 1000ML POUR BTL (IV SOLUTION) ×2 IMPLANT
PACK EENT II TURBAN DRAPE (CUSTOM PROCEDURE TRAY) ×2 IMPLANT
PAD ARMBOARD 7.5X6 YLW CONV (MISCELLANEOUS) ×2 IMPLANT
SPONGE SURGIFOAM ABS GEL 100 (HEMOSTASIS) ×1 IMPLANT
SPONGE SURGIFOAM ABS GEL 12-7 (HEMOSTASIS) IMPLANT
SPONGE SURGIFOAM ABS GEL SZ50 (HEMOSTASIS) IMPLANT
SUCTION FRAZIER HANDLE 10FR (MISCELLANEOUS) ×1
SUCTION TUBE FRAZIER 10FR DISP (MISCELLANEOUS) ×1 IMPLANT
SUT CHROMIC 3 0 PS 2 (SUTURE) ×4 IMPLANT
SUT CHROMIC 4 0 P 3 18 (SUTURE) IMPLANT
SYR 50ML SLIP (SYRINGE) ×2 IMPLANT
TOWEL OR 17X26 10 PK STRL BLUE (TOWEL DISPOSABLE) ×2 IMPLANT
TUBE CONNECTING 12X1/4 (SUCTIONS) ×2 IMPLANT
WATER STERILE IRR 1000ML POUR (IV SOLUTION) ×2 IMPLANT
WATER TABLETS ICX (MISCELLANEOUS) ×2 IMPLANT
YANKAUER SUCT BULB TIP NO VENT (SUCTIONS) ×2 IMPLANT

## 2016-11-29 NOTE — Progress Notes (Signed)
PRE-OPERATIVE NOTE:  11/29/2016 Hilton SinclairWillie O List [id number]    Lab Results  Component Value Date   WBC 6.8 11/04/2016   HGB 13.6 11/04/2016   HCT 40.0 11/04/2016   MCV 85 11/04/2016   PLT 217 11/04/2016   BMET    Component Value Date/Time   NA 141 11/04/2016 1012   K 4.9 11/04/2016 1012   CL 106 11/04/2016 1012   CO2 21 11/04/2016 1012   GLUCOSE 107 (H) 11/04/2016 1012   GLUCOSE 95 02/03/2009 1250   BUN 13 11/04/2016 1012   CREATININE 0.73 (L) 11/04/2016 1012   CALCIUM 9.6 11/04/2016 1012   GFRNONAA 97 11/04/2016 1012   GFRAA 113 11/04/2016 1012    Lab Results  Component Value Date   INR 1.0 11/04/2016   No results found for: PTT   Hilton SinclairWillie O Patch presents for multiple dental extractions of indicated teeth with alveoloplasty in the operating room with general anesthesia.     SUBJECTIVE: The patient denies any acute medical or dental changes and agrees to proceed with treatment as planned.  Patient understands that multiple teeth will be extracted and ultimate decision on teeth that need to be extracted will be made in the operating room after further evaluation under general anesthesia.  EXAM: No sign of acute dental changes.  ASSESSMENT: Patient is affected by chronic apical periodontitis, retained root segment, chronic periodontitis, loose teeth, and Accretions.  PLAN: Patient agrees to proceed with treatment as planned in the operating room as previously discussed and accepts the risks, benefits, and complications of the proposed treatment. Patient is aware of the risk for bleeding, bruising, swelling, infection, pain, nerve damage, soft tissue damage, damage to adjacent teeth, sinus involvement, root tip fracture, mandible fracture, and the risks of complications associated with the anesthesia. Patient also is aware of the potential for other complications up to and including death due to his overall cardiovascular compromise.    Charlynne Panderonald F. Glendola Friedhoff, DDS

## 2016-11-29 NOTE — Anesthesia Procedure Notes (Signed)
Procedure Name: Intubation Date/Time: 11/29/2016 9:30 AM Performed by: Alvera NovelPike, Amarian Botero H, CRNA Pre-anesthesia Checklist: Patient identified, Emergency Drugs available, Suction available and Patient being monitored Patient Re-evaluated:Patient Re-evaluated prior to induction Oxygen Delivery Method: Circle System Utilized Preoxygenation: Pre-oxygenation with 100% oxygen Induction Type: IV induction Ventilation: Mask ventilation without difficulty Grade View: Grade II Tube type: Oral Nasal Tubes: Nasal Rae Tube size: 7.0 mm Number of attempts: 1 Airway Equipment and Method: Stylet and Oral airway Placement Confirmation: ETT inserted through vocal cords under direct vision,  positive ETCO2 and breath sounds checked- equal and bilateral Tube secured with: Tape Dental Injury: Teeth and Oropharynx as per pre-operative assessment

## 2016-11-29 NOTE — Progress Notes (Signed)
Pt has been waiting in phase II since 1300. Unable to arrange for transportation home until after 5 PM. Pt's niece arrived at [id number]. Pt alert & oriented. No c/o pain. VSS. D/c instructions covered with pt and his niece

## 2016-11-29 NOTE — Anesthesia Postprocedure Evaluation (Signed)
Anesthesia Post Note  Patient: [name]  Procedure(s) Performed: Extraction of tooth #'s [id number],24,25,26 and 32 with alveoloplasty and gross debridement of remaining teeth (N/A Mouth)     Patient location during evaluation: PACU Anesthesia Type: General Level of consciousness: awake and alert and oriented Pain management: pain level controlled Vital Signs Assessment: post-procedure vital signs reviewed and stable Respiratory status: spontaneous breathing, nonlabored ventilation, respiratory function stable and patient connected to nasal cannula oxygen Cardiovascular status: blood pressure returned to baseline and stable Postop Assessment: no apparent nausea or vomiting Anesthetic complications: no    Last Vitals:  Vitals:   11/29/16 1245 11/29/16 1300  BP: (!) 121/48 (!) 125/49  Pulse: 63 (!) 56  Resp: 18 15  Temp:  (!) 36.2 C  SpO2: (!) 89% 93%    Last Pain:  Vitals:   11/29/16 1230  TempSrc:   PainSc: 0-No pain                 Furman Trentman,JAMES TERRILL

## 2016-11-29 NOTE — Op Note (Signed)
OPERATIVE REPORT  Patient:            [name] Date of Birth:  April 02, 1950 MRN:                [id number]   DATE OF PROCEDURE:  11/29/2016  PREOPERATIVE DIAGNOSES: 1.  Severe aortic stenosis 2.  Pre-heart valve surgery dental protocol 3.  Chronic apical periodontitis 4.  Retained dental root 5.  Chronic periodontitis 6.  Accretions 7.  Loose teeth  POSTOPERATIVE DIAGNOSES: 1.  Severe aortic stenosis 2.  Pre-heart valve surgery dental protocol 3.  Chronic apical periodontitis 4.  Retained dental root 5.  Chronic periodontitis 6.  Accretions 7.  Loose teeth  OPERATIONS: 1. Multiple extraction of tooth numbers 1, 24, 25, 26, and 32 2. 3 Quadrants of alveoloplasty 3. Gross debridement of remaining dentition   SURGEON: Charlynne Panderonald F. Nyara Capell, DDS  ASSISTANT: Rory PercyLisa Ingram, (dental assistant)  ANESTHESIA: General anesthesia via nasoendotracheal tube.  MEDICATIONS: 1. Ancef 2 g IV prior to invasive dental procedures. 2. Local anesthesia with a total utilization of 4 carpules each containing 34 mg of lidocaine with 0.017 mg of epinephrine as well as 2 carpules each containing 9 mg of bupivacaine with 0.009 mg of epinephrine.  SPECIMENS: There are 5 teeth that were discarded.  DRAINS: None  CULTURES: None  COMPLICATIONS: None   ESTIMATED BLOOD LOSS: 150 MLs.  INTRAVENOUS FLUIDS: 1000 MLs of Lactated ringers solution.  INDICATIONS: The patient was recently diagnosed with severe aortic stenosis.  A medically necessary dental consultation was then requested to evaluate poor dentition.  The patient was examined and treatment planned for multiple extraction of indicated teeth with alveoloplasty and gross debridement of remaining dentition in the operating room with general anesthesia.  This treatment plan was formulated to decrease the risks and complications associated with dental infection from affecting the patient's systemic health and anticipated heart valve  surgery surgery.  OPERATIVE FINDINGS: Patient was examined in operating room number 8.  The teeth were identified for extraction. The patient was noted be affected by chronic periodontitis, chronic apical periodontitis, retained root tip #25, accretions, and loose teeth.   DESCRIPTION OF PROCEDURE: Patient was brought to the main operating room number 8. Patient was then placed in the supine position on the operating table.  General anesthesia was then induced per the anesthesia team. The patient was then prepped and draped in the usual manner for dental medicine procedure. A timeout was performed. The patient was identified and procedures were verified. A throat pack was placed at this time. The oral cavity was then thoroughly examined with the findings noted above. The patient was then ready for dental medicine procedure as follows:  Local anesthesia was then administered sequentially with a total utilization of 4 carpules each containing 34 mg of lidocaine with 0.017 mg of epinephrine as well as 2 carpules  each containing 9 mg bupivacaine with 0.009 mg of epinephrine.  The Maxillary right quadrant was first approached. Anesthesia was then delivered utilizing infiltration with lidocaine with epinephrine. A #15 blade incision was then made from the maxillary right tuberosity and extended to the mesial of #2.  A  surgical flap was then carefully reflected. Appropriate amounts of buccal and interseptal bone were then removed utilizing a surgical handpiece and bur and copious amounts of sterile water around tooth #1.  Tooth #1 was then subluxated with a series of straight elevators. Tooth number 1 was then removed with a 53R forceps without complications.  Alveoloplasty was then performed utilizing a ronguers and bone file. The surgical site was then irrigated with copious amounts of sterile saline. The tissues were approximated and trimmed appropriately.  A piece of Surgifoam was placed the extraction  socket.  The surgical site was then closed from the maxillary right tuberosity and extended to the distal #2 utilizing 3-0 chromic gut suture in a continuous interrupted suture technique x1.  At this point time, the mandibular quadrants were approached. The patient was given bilateral inferior alveolar nerve blocks and long buccal nerve blocks utilizing the bupivacaine with epinephrine. Further infiltration was then achieved utilizing the lidocaine with epinephrine. A 15 blade incision was then made from the distal of number 32 and extended to the distal of #30.  A surgical flap was then carefully reflected. Appropriate amounts of buccal and interseptal bone were then removed utilizing a surgical handpiece and copious amount of sterile water around tooth #32. Tooth number 32 was then subluxated with a series of straight elevators. Tooth number 32 was then removed with a 23 forceps without complications.  Alveoloplasty was then performed utilizing a rongeurs and bone file. The tissues were approximated and trimmed appropriately.  The surgical site was then irrigated with copious sterile saline.  A piece of Surgifoam was then placed in the extraction socket appropriately. The mandibular right surgical site was then closed from the distal of 32 and extended to the distal of #30 utilizing 3-0 chromic gut suture in continuous interrupted suture technique x1.   The mandibular anterior surgical site was then approached.  A 15 blade incision was made from the medial #22 and extended the mesial #27 a surgical flap was then carefully reflected.  Tooth numbers 24, and 26 were then removed with a 151 forceps.  Retained root tips in the area of tooth numbers 25 and 26 were then removed with a root tip pick without complication.  Alveoloplasty was then performed utilizing rongeurs and bone file.  Surgical site was irrigated with copious amounts sterile saline.  Surgical site was then closed with a mesial #22 and extended to  the mesial #27 utilizing 3-0 chromic gut suture in a continuous interrupted suture technique x1.   At this point in time, the remaining dentition was approached.  A Cavitron was used to remove significant accretions.  A series of hand curettes were then used to further remove accretions.  The Cavitron was then again used to further refine the removal of accretions.  The surgical site was then irrigated with copious amounts sterile saline.  This completed the gross debridement procedure.  Patient will need follow-up with a primary dentist of his choice for continued periodontal therapy once he is medically stable from the anticipated heart valve surgery.  At this point time, the entire mouth was irrigated with copious amounts of sterile saline. The patient was examined for complications, seeing none, the dental medicine procedure was deemed to be complete. The throat pack was removed at this time. A series of 4 x 4 gauze were placed in the mouth to aid hemostasis. The patient was then handed over to the anesthesia team for final disposition. After an appropriate amount of time, the patient was extubated and taken to the postanesthsia care unit in good condition. All counts were correct for the dental medicine procedure.   Charlynne Panderonald F. Muaz Shorey, DDS.

## 2016-11-29 NOTE — H&P (Signed)
11/29/2016   Patient:            [name] Date of Birth:  1950/08/23 MRN:                [id number]   [name] is a 66 year old male with severe aortic stenosis with anticipated aortic valve replacement.  Patient now presents for multiple dental extraction of indicated teeth with alveoloplasty and gross debridement of remaining dentition in the operating room and general anesthesia.  The patient denies any acute medical or dental changes.  Please see note from Dr. Laneta Simmers dated 11/17/2016 to act as H&P for dental operating room procedure.  Charlynne Pander, DDS     Cardiothoracic Surgery Consultation     PCP is Carylon Perches, MD  Referring Provider is Kathleene Hazel*          Chief Complaint    Patient presents with    .   Aortic Stenosis            SEVERE...eval for conventional surgery or TAVR...ECHO 10/26/16, CATH 11/14/16    .   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  He has been followed by Dr. Purvis Sheffield and his most recent echocardiogram on 10/26/2016 showed severe aortic stenosis with a mean gradient of 41 mmHg with a peak gradient of 86 mmHg.  The aortic valve area was calculated at 1.12 cm.  There was moderate aortic insufficiency and moderate mitral regurgitation.  The patient says that a few weeks ago he developed brief on and off episodes of chest pain that was mild with no associated symptoms.  This resolved and he has had no recurrence.  He denies any shortness of breath with exertion or fatigue.  He says that he has been chopping wood over the past couple weeks without any difficulty and his energy level has been good.  He denies any dizziness or syncope.  He has had no peripheral edema.  He was seen  by Dr. Clifton James and underwent cardiac catheterization on 11/14/2016 showing mild nonobstructive coronary disease in the right coronary artery and LAD territories.  There was a chronic total occlusion of a mid left circumflex stent.  The mean gradient across aortic valve was measured at 31 mmHg with a peak to peak gradient of 27 mmHg and an aortic valve area of 0.62 cm.  His cardiac index was 2.12.  Right heart pressures were normal.          Past Medical History:    Diagnosis   Date    .   CAD (coronary artery disease)        .   Diabetes mellitus        .   Hyperlipidemia        .   MI (myocardial infarction) (HCC)        .   S/P AKA (above knee amputation) (HCC)                    Past Surgical History:    Procedure   Laterality   Date    .   CORONARY ANGIOPLASTY WITH STENT PLACEMENT            .   LEG  SURGERY                Repair of left leg trauma    .   RIGHT/LEFT HEART CATH AND CORONARY ANGIOGRAPHY   N/A   11/14/2016        Procedure: RIGHT/LEFT HEART CATH AND CORONARY ANGIOGRAPHY;  Surgeon: Kathleene Hazel, MD;  Location: [facility];  Service: Cardiovascular;  Laterality: N/A;                Family History    Problem   Relation   Age of Onset    .   Hypertension   Mother        .   Heart attack   Father              Social History          Social History    Substance Use Topics    .   Smoking status:   Former Smoker            Packs/day:   0.30            Types:   Cigars, Cigarettes            Start date:   01/18/1956            Quit date:   11/25/2015    .   Smokeless tobacco:   Never Used    .   Alcohol use   0.0 oz/week                Comment: Occasional                 Current Outpatient Prescriptions    Medication   Sig   Dispense   Refill    .   aspirin 81  MG chewable tablet   Chew 81 mg by mouth daily.            [name]   HYDROcodone-acetaminophen (NORCO/VICODIN) 5-325 MG tablet   Take 1 tablet by mouth every 6 (six) hours as needed for moderate pain (Must last 30 days.  Do not take and drive a car or use machinery.).   110 tablet   0    .   Insulin Degludec (TRESIBA FLEXTOUCH) 200 UNIT/ML SOPN   Inject 5 Units into the skin at bedtime.            [name]   lisinopril (PRINIVIL,ZESTRIL) 5 MG tablet   Take 1 tablet (5 mg total) by mouth daily.   30 tablet   6    .   metFORMIN (GLUCOPHAGE) 1000 MG tablet   Take 1,000 mg by mouth 2 (two) times daily with a meal.            .   metoprolol tartrate (LOPRESSOR) 25 MG tablet   TAKE 1/2 TABLET BY MOUTH TWICE DAILY   60 tablet   0    .   naproxen (NAPROSYN) 500 MG tablet   Take 1 tablet (500 mg total) by mouth 2 (two) times daily with a meal. (Patient taking differently: Take 220 mg by mouth 2 (two) times daily with a meal. )   60 tablet   5    .   naproxen sodium (ANAPROX) 220 MG tablet   Take 220 mg by mouth 2 (two) times daily as needed (for pain.).            [name]   nitroGLYCERIN (NITROSTAT) 0.4 MG SL  tablet   Place 0.4 mg under the tongue every 5 (five) minutes as needed. Chest pain.            .   simvastatin (ZOCOR) 40 MG tablet   Take 40 mg by mouth at bedtime.              .   tamsulosin (FLOMAX) 0.4 MG CAPS capsule   Take 0.4 mg by mouth daily.                 No current facility-administered medications for this visit.           No Known Allergies     Review of Systems   Constitutional: Negative for activity change, appetite change, fatigue and unexpected weight change.   HENT: Positive for dental problem.         Has loose teeth and has not seen dentist in 5 years.   Eyes: Negative.    Respiratory: Negative for cough, chest tightness and shortness of breath.    Cardiovascular: Negative  for chest pain, palpitations and leg swelling.   Gastrointestinal: Negative.    Endocrine: Negative.    Genitourinary: Negative.    Musculoskeletal: Negative.    Skin: Negative.    Allergic/Immunologic: Negative.    Neurological: Negative for dizziness, syncope and weakness.   Hematological: Negative.    Psychiatric/Behavioral: Negative.          BP (!) 138/56 (BP Location: Left Arm, Patient Position: Sitting, Cuff Size: Large)   Pulse 70   Resp 16   Ht 5\' 9"  (1.753 m)   Wt 160 lb (72.6 kg)   SpO2 94% Comment: ON RA  BMI 23.63 kg/m   Physical Exam   Constitutional: He is oriented to person, place, and time. He appears well-developed and well-nourished. No distress.   HENT:   Head: Normocephalic and atraumatic.  Poor dentition with multiple missing and loose teeth.   Eyes: Pupils are equal, round, and reactive to light. EOM are normal.   Neck: Normal range of motion. Neck supple. No JVD present. No thyromegaly present.   Cardiovascular: Normal rate, regular rhythm, normal heart sounds and intact distal pulses.    No murmur heard.  Pulmonary/Chest: Effort normal and breath sounds normal. No respiratory distress.   Abdominal: Soft. Bowel sounds are normal. He exhibits no distension and no mass. There is no tenderness.  Musculoskeletal: Normal range of motion. He exhibits deformity. He exhibits no edema.  Left AKA with prosthesis.  Lymphadenopathy:    He has no cervical adenopathy.  Neurological: He is alert and oriented to person, place, and time. He has normal strength. No cranial nerve deficit or sensory deficit.   Skin: Skin is warm and dry.  Psychiatric: He has a normal mood and affect.            Diagnostic Tests:        *[facility]*                          [address]                         [place], [place] [address]                             [phone]      -------------------------------------------------------------------  Transthoracic Echocardiography  Patient:    Kosei, Rhodes  MR #:       [id number]  Study Date: 10/26/2016  Gender:     M  Age:        65  Height:     175.3 cm  Weight:     72.1 kg  BSA:        1.88 m^2  Pt. Status:  Room:      ATTENDING    Prentice Docker, MD   ORDERING     Prentice Docker, MD   REFERRING    Prentice Docker, MD   PERFORMING   [facility], Jeani Hawking   SONOGRAPHER  Celesta Gentile, RCS     cc:     -------------------------------------------------------------------  LV EF: 60% -   65%     -------------------------------------------------------------------  Indications:      Aortic stenosis 424.1.     -------------------------------------------------------------------  History:   PMH:  Acquired from the patient and from the patient&'s  chart.  Murmur.  Risk factors:  Diabetes mellitus. Dyslipidemia.     -------------------------------------------------------------------  Study Conclusions     - Left ventricle: The cavity size was mildly dilated. Wall    thickness was increased in a pattern of mild LVH. Systolic    function was normal. The estimated ejection fraction was in the    range of 60% to 65%. Wall motion was normal; there were no    regional wall motion abnormalities. Doppler parameters are    consistent with abnormal left ventricular relaxation (grade 1    diastolic dysfunction).  - Aortic valve: Heavily calcified aortic leaflets. There was severe    stenosis. There was moderate regurgitation. Mean gradient (S): 41    mm Hg. Peak gradient (S): 86 mm Hg. VTI ratio of LVOT to aortic    valve: 0.36. Valve area (VTI): 1.12 cm^2.  - Mitral valve: Mildly calcified annulus. Mildly thickened, mildly    calcified leaflets . There was moderate regurgitation.  - Left atrium: The atrium was moderately dilated.  - Right atrium: Central  venous pressure (est): 3 mm Hg.  - Atrial septum: No defect or patent foramen ovale was identified.  - Tricuspid valve: There was trivial regurgitation.  - Pulmonary arteries: PA peak pressure: 27 mm Hg (S).  - Pericardium, extracardiac: There was no pericardial effusion.     Impressions:     - Mild LVH with mild chamber dilatation and LVEF 60-65%. Grade 1    diastolic dysfunction. Moderate left atrial enlargement. Mildly    thickened and calcified mitral leaflets with moderate mitral    regurgitation. Heavily calcified aortic valve with evidence of    overall severe aortic stenosis and moderate aortic regurgitation    as outlined above. Valve gradients have increased in comparison    to the previous study. Trivial tricuspid regurgitation with    estimated PASP 27 mmHg.     -------------------------------------------------------------------  Study data:  Comparison was made to the study of 06/29/2016.  Study  status:  Routine.  Procedure:  The patient reported no pain pre or  post test. Transthoracic echocardiography. Image quality was  adequate.  Study completion:  There were no complications.  Transthoracic echocardiography.  M-mode, complete 2D, spectral  Doppler, and color Doppler.  Birthdate:  Patient birthdate:  08/26/1950.  Age:  Patient is 66 yr old.  Sex:  Gender: male.  BMI: 23.5 kg/m^2.  Blood pressure:     171/69  Patient status:  Inpatient.  Study date:  Study date:  10/26/2016. Study time: 01:01  PM.  Location:  Echo laboratory.     -------------------------------------------------------------------     -------------------------------------------------------------------  Left ventricle:  The cavity size was mildly dilated. Wall thickness  was increased in a pattern of mild LVH. Systolic function was  normal. The estimated ejection fraction was in the range of 60% to  65%. Wall motion was normal; there were no regional wall  motion  abnormalities. Doppler parameters are consistent with abnormal left  ventricular relaxation (grade 1 diastolic dysfunction).     -------------------------------------------------------------------  Aortic valve:  Heavily calcified aortic leaflets.  Doppler:   There  was severe stenosis.   There was moderate regurgitation.    VTI  ratio of LVOT to aortic valve: 0.36. Valve area (VTI): 1.12 cm^2.  Indexed valve area (VTI): 0.6 cm^2/m^2. Peak velocity ratio of LVOT  to aortic valve: 0.36. Valve area (Vmax): 1.12 cm^2. Indexed valve  area (Vmax): 0.6 cm^2/m^2. Mean velocity ratio of LVOT to aortic  valve: 0.37. Valve area (Vmean): 1.16 cm^2. Indexed valve area  (Vmean): 0.62 cm^2/m^2.    Mean gradient (S): 41 mm Hg. Peak  gradient (S): 86 mm Hg.     -------------------------------------------------------------------  Aorta:  Aortic root: The aortic root was normal in size.     -------------------------------------------------------------------  Mitral valve:   Mildly calcified annulus. Mildly thickened, mildly  calcified leaflets .  Doppler:  There was moderate regurgitation.   Peak gradient (D): 6 mm Hg.     -------------------------------------------------------------------  Left atrium:  The atrium was moderately dilated.     -------------------------------------------------------------------  Atrial septum:  No defect or patent foramen ovale was identified.     -------------------------------------------------------------------  Right ventricle:  The cavity size was normal. Systolic function was  normal.     -------------------------------------------------------------------  Pulmonic valve:    The valve appears to be grossly normal.  Doppler:  There was trivial regurgitation.     -------------------------------------------------------------------  Tricuspid valve:   The valve appears to be grossly normal.  Doppler:  There was  trivial regurgitation.     -------------------------------------------------------------------  Right atrium:  The atrium was normal in size.     -------------------------------------------------------------------  Pericardium:  There was no pericardial effusion.     -------------------------------------------------------------------  Systemic veins:  Inferior vena cava: The vessel was normal in size. The  respirophasic diameter changes were in the normal range (>= 50%),  consistent with normal central venous pressure.     -------------------------------------------------------------------  Measurements      Left ventricle                           Value          Reference   LV ID, ED, PLAX chordal          (H)     54    mm       43 - 52   LV ID, ES, PLAX chordal                  32    mm       23 - 38   LV fx shortening, PLAX chordal           41    %        >=29   LV PW thickness, ED                      12.5  mm       ----------  IVS/LV PW ratio, ED                      0.98           <=1.3   Stroke volume, 2D                        127   ml       ----------   Stroke volume/bsa, 2D                    68    ml/m^2   ----------   LV ejection fraction, 1-p A4C            66    %        ----------   LV end-diastolic volume, 2-p             134   ml       ----------   LV end-systolic volume, 2-p              50    ml       ----------   LV ejection fraction, 2-p                63    %        ----------   Stroke volume, 2-p                       84    ml       ----------   LV end-diastolic volume/bsa, 2-p         71    ml/m^2   ----------   LV end-systolic volume/bsa, 2-p          27    ml/m^2   ----------   Stroke volume/bsa, 2-p                   44.8  ml/m^2   ----------   LV e&', lateral                           6.31  cm/s     ----------   LV E/e&', lateral                         20.13          ----------   LV e&', medial                             7.51  cm/s     ----------   LV E/e&', medial                          16.91          ----------   LV e&', average                           6.91  cm/s     ----------   LV E/e&', average                         18.38          ----------      Ventricular septum  Value          Reference   IVS thickness, ED                        12.3  mm       ----------      LVOT                                     Value          Reference   LVOT ID, S                               20    mm       ----------   LVOT area                                3.14  cm^2     ----------   LVOT peak velocity, S                    165   cm/s     ----------   LVOT mean velocity, S                    107   cm/s     ----------   LVOT VTI, S                              40.3  cm       ----------   LVOT peak gradient, S                    11    mm Hg    ----------      Aortic valve                             Value          Reference   Aortic valve peak velocity, S            463   cm/s     ----------   Aortic valve mean velocity, S            289   cm/s     ----------   Aortic valve VTI, S                      113   cm       ----------   Aortic mean gradient, S                  41    mm Hg    ----------   Aortic peak gradient, S                  86    mm Hg    ----------   VTI ratio, LVOT/AV                       0.36           ----------   Aortic valve area, VTI  1.12  cm^2     ----------   Aortic valve area/bsa, VTI               0.6   cm^2/m^2 ----------   Velocity ratio, peak, LVOT/AV            0.36           ----------   Aortic valve area, peak velocity         1.12  cm^2     ----------   Aortic valve area/bsa, peak              0.6   cm^2/m^2 ----------   velocity   Velocity ratio, mean, LVOT/AV            0.37           ----------   Aortic valve area, mean velocity         1.16  cm^2     ----------   Aortic valve area/bsa, mean               0.62  cm^2/m^2 ----------   velocity   Aortic regurg pressure half-time         375   ms       ----------      Aorta                                    Value          Reference   Aortic root ID, ED                       35    mm       ----------      Left atrium                              Value          Reference   LA ID, A-P, ES                           40    mm       ----------   LA ID/bsa, A-P                           2.13  cm/m^2   <=2.2   LA volume, S                             65.5  ml       ----------   LA volume/bsa, S                         34.9  ml/m^2   ----------   LA volume, ES, 1-p A4C                   78.1  ml       ----------   LA volume/bsa, ES, 1-p A4C               41.6  ml/m^2   ----------   LA volume, ES, 1-p A2C  48.7  ml       ----------   LA volume/bsa, ES, 1-p A2C               25.9  ml/m^2   ----------      Mitral valve                             Value          Reference   Mitral E-wave peak velocity              127   cm/s     ----------   Mitral A-wave peak velocity              159   cm/s     ----------   Mitral deceleration time                 229   ms       150 - 230   Mitral peak gradient, D                  6     mm Hg    ----------   Mitral E/A ratio, peak                   0.8            ----------      Pulmonary arteries                       Value          Reference   PA pressure, S, DP                       27    mm Hg    <=30      Tricuspid valve                          Value          Reference   Tricuspid regurg peak velocity           243   cm/s     ----------   Tricuspid peak RV-RA gradient            24    mm Hg    ----------      Right atrium                             Value          Reference   RA ID, S-I, ES, A4C                      44.7  mm       34 - 49   RA area, ES, A4C                         13.8  cm^2     8.3 - 19.5   RA volume, ES, A/L                       35.9  ml        ----------   RA volume/bsa, ES, A/L  19.1  ml/m^2   ----------      Systemic veins                           Value          Reference   Estimated CVP                            3     mm Hg    ----------      Right ventricle                          Value          Reference   RV ID, ED, PLAX                          31.8  mm       19 - 38   TAPSE                                    16.5  mm       ----------   RV pressure, S, DP                       27    mm Hg    <=30   RV s&', lateral, S                        9.36  cm/s     ----------     Legend:  (L)  and  (H)  mark values outside specified reference range.     -------------------------------------------------------------------  Prepared and Electronically Authenticated by     Nona Dell, M.D.  2018-10-10T15:57:17     [facility]   CARDIAC CATHETERIZATION   Order# [id number]    Reading physician: Kathleene Hazel, MD   Ordering physician: Kathleene Hazel, MD   Study date: 11/14/16    Physicians        Panel Physicians    Referring Physician    Case Authorizing Physician     Kathleene Hazel, MD (Primary)            Procedures       RIGHT/LEFT HEART CATH AND CORONARY ANGIOGRAPHY    Conclusion          .Mid RCA lesion, 40 %stenosed.   .Dist RCA lesion, 30 %stenosed.   Suezanne Jacquet RPDA lesion, 20 %stenosed.   Suezanne Jacquet LM lesion, 20 %stenosed.   .Mid Cx lesion, 100 %stenosed.   .There is severe aortic valve stenosis.   .Mid LAD to Dist LAD lesion, 10 %stenosed.      1. Mild non-obstructive disease in the RCA, LAD and intermediate branches.   2. 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   3. Severe aortic valve stenosis (mean gradient 31 mmHg, peak to peak gradient 27 mmHg, AVA 0.62 cm2)     Recommendations: will continue planning for AVR. He will be seen in our CT surgery office. I  think the surgical consult should be obtained before we perform CT scans as he may need surgical AVR.        Indications       Severe aortic  stenosis [I35.0 (ICD-10-CM)]    Procedural Details/Technique        Technical Details   Indication: 66 yo male with history of CAD, DM and severe aortic stenosis, moderate AI, moderate MR with recent worsened chest pressure/pain.   Procedure: The risks, benefits, complications, treatment options, and expected outcomes were discussed with the patient. The patient and/or family concurred with the proposed plan, giving informed consent. The patient was brought to the cath lab after IV hydration was given. The patient was further sedated with Versed. The right groin was prepped and draped. A 7 French sheath was placed in the right femoral vein under u/s guidance. Balloon tipped catheter used to perform right heart catheterization. The right wrist was prepped and draped in a sterile fashion. 1% lidocaine was used for local anesthesia. Using the modified Seldinger access technique, a 5 French sheath was placed in the right radial artery. 3 mg Verapamil was given through the sheath. 3500 units IV heparin was given. Standard diagnostic catheters were used to perform selective coronary angiography. I engaged the RCA with a JR4 catheter. I engaged the left main artery with an AL-2 catheter. I crossed the aortic valve with an AL-2 catheter and a straight wire. LV pressures measured. No LV gram performed. The sheath was removed from the right radial artery and a Terumo hemostasis band was applied at the arteriotomy site on the right wrist.    Estimated blood loss <50 mL.  During this procedure the patient was administered the following to achieve and maintain moderate conscious sedation: Versed 1 mg, while the patient's heart rate, blood pressure, and oxygen saturation were continuously monitored. The period of conscious sedation was 50 minutes, of which I was  present face-to-face 100% of this time.       Complications       Complications documented before study signed (11/14/2016  1:51 PM EDT)        RIGHT/LEFT HEART CATH AND CORONARY ANGIOGRAPHY       None   Documented by Kathleene Hazel, MD 11/14/2016  1:50 PM EDT    Time Range: Intra-procedure            Coronary Findings       Dominance: Right    Left Main    Ost LM lesion, 20% stenosed.    Left Anterior Descending    Vessel is large.    Mid LAD to Dist LAD lesion, 10% stenosed.    First Diagonal Branch    Vessel is moderate in size.    First Septal Branch    Vessel is small in size.    Second Diagonal Branch    Vessel is small in size.    Second Septal Branch    Vessel is small in size.    Third Diagonal Branch    Vessel is small in size.    Third Septal Branch    Vessel is small in size.    Ramus Intermedius    Vessel is moderate in size.    Left Circumflex    Mid Cx lesion, 100% stenosed. The lesion is chronically occluded. The lesion was previously treated using a bare metal stent over 2 years ago.    Second Obtuse Marginal Branch    Vessel is small in size.    Right Coronary Artery    Mid RCA lesion, 40% stenosed.    Dist RCA lesion, 30% stenosed.    Right Posterior Descending Artery    Ost RPDA lesion, 20%  stenosed.    Right Heart        Right Heart Pressures   Elevated LV EDP consistent with volume overload.       Left Heart        Aortic Valve   There is severe aortic valve stenosis. The aortic valve is calcified.       Coronary Diagrams       Diagnostic Diagram       untitled image       Implants             No implant documentation for this case.    MERGE Images       Show images for CARDIAC CATHETERIZATION      Link to Procedure Log       Procedure Log       Hemo Data             Most Recent Value      Fick Cardiac Output   3.98 L/min    Fick Cardiac Output Index   2.12 (L/min)/BSA    Aortic Mean Gradient   31.1 mmHg    Aortic Peak Gradient   27 mmHg    Aortic Valve Area   0.62    Aortic Value Area Index   0.33 cm2/BSA    RA A Wave   8 mmHg    RA V Wave   6 mmHg    RA Mean   5 mmHg    RV Systolic Pressure   44 mmHg    RV Diastolic Pressure   4 mmHg    RV EDP   8 mmHg    PA Systolic Pressure   37 mmHg    PA Diastolic Pressure   15 mmHg    PA Mean   22 mmHg    PW A Wave   17 mmHg    PW V Wave   13 mmHg    PW Mean   12 mmHg    AO Systolic Pressure   119 mmHg    AO Diastolic Pressure   46 mmHg    AO Mean   72 mmHg    LV Systolic Pressure   179 mmHg    LV Diastolic Pressure   13 mmHg    LV EDP   22 mmHg    Arterial Occlusion Pressure Extended Systolic Pressure   151 mmHg    Arterial Occlusion Pressure Extended Diastolic Pressure   51 mmHg    Arterial Occlusion Pressure Extended Mean Pressure   87 mmHg    Left Ventricular Apex Extended Systolic Pressure   178 mmHg    Left Ventricular Apex Extended Diastolic Pressure   10 mmHg    Left Ventricular Apex Extended EDP Pressure   23 mmHg    QP/QS   1    TPVR Index   10.4 HRUI    TSVR Index   34.04 HRUI    PVR SVR Ratio   0.15    TPVR/TSVR Ratio   0.31          Impression:     This 66 year old gentleman has stage C asymptomatic severe aortic stenosis with moderate aortic insufficiency and moderate mitral regurgitation.  I reviewed the symptoms of aortic stenosis multiple times with him and he denies any symptoms other than the brief episodes of chest pain that he had one day a few weeks ago that have resolved and not returned.  He has been chopping wood without any difficulty.  I  have personally reviewed and interpreted his recent echocardiogram and cardiac catheterization.  His left  ventricular systolic function is normal but he does have grade 1 diastolic dysfunction and his left ventricular internal dimension during diastole has increased to 54 mm.  His mitral valve has some calcification and thickening of the leaflets.  There is fluttering of the anterior leaflet during diastole related to his aortic insufficiency and I wonder if that is interfering with closure of the valve.  His mitral regurgitation looks more mild to me.  His cardiac catheterization shows nonobstructive coronary disease with a chronically occluded left circumflex stent.  He does not require any coronary bypasses.  I think the best option for him is to have his valve replaced before he has progressive left ventricular deterioration given his combination of severe aortic stenosis and moderate aortic insufficiency.  I do not think he would be a candidate for TAVR since his operative risk is low and he is only 66 years old.  I discussed this with him and he seems to understand the reasoning.  I discussed open surgical aortic valve replacement with him and my recommendation for using a bioprosthetic valve.  He will need a dental evaluation preoperatively.        Plan:     He will be referred to Dr. Robin Searing for dental evaluation and I will see him back in the office after that to discuss surgery further and answer any questions.  He would like to wait until later in December or January surgery.        I spent 60 minutes performing this consultation and > 50% of this time was spent face to face counseling and coordinating the care of this patient's severe aortic stenosis and moderate aortic insufficiency.        Alleen Borne, MD  [facility]  [phone]           Electronically signed by Alleen Borne, MD at 11/17/2016  5:07 PM

## 2016-11-29 NOTE — Transfer of Care (Signed)
Immediate Anesthesia Transfer of Care Note  Patient: [name]  Procedure(s) Performed: Extraction of tooth #'s [id number],24,25,26 and 32 with alveoloplasty and gross debridement of remaining teeth (N/A Mouth)  Patient Location: PACU  Anesthesia Type:General  Level of Consciousness: awake, alert  and oriented  Airway & Oxygen Therapy: Patient Spontanous Breathing and Patient connected to face mask oxygen  Post-op Assessment: Report given to RN and Post -op Vital signs reviewed and stable  Post vital signs: Reviewed and stable  Last Vitals:  Vitals:   11/29/16 1114 11/29/16 1115  BP: (!) 151/51 (!) 151/51  Pulse: 63   Resp: 19   Temp: 36.4 C   SpO2: 98%     Last Pain:  Vitals:   11/29/16 0846  TempSrc: Oral      Patients Stated Pain Goal: 2 (11/29/16 0836)  Complications: No apparent anesthesia complications

## 2016-11-29 NOTE — Anesthesia Preprocedure Evaluation (Addendum)
Anesthesia Evaluation  Patient identified by MRN, date of birth, ID band Patient awake    Reviewed: Allergy & Precautions, NPO status , Patient's Chart, lab work & pertinent test results  Airway Mallampati: II  TM Distance: >3 FB Neck ROM: Full    Dental  (+) Missing, Poor Dentition, Loose   Pulmonary former smoker,    breath sounds clear to auscultation       Cardiovascular + CAD and + Past MI  + Valvular Problems/Murmurs AS  Rhythm:Regular Rate:Normal + Systolic murmurs and + Diastolic murmurs    Neuro/Psych    GI/Hepatic negative GI ROS, Neg liver ROS,   Endo/Other  diabetes, Poorly Controlled  Renal/GU      Musculoskeletal  (+) Arthritis ,   Abdominal   Peds  Hematology   Anesthesia Other Findings   Reproductive/Obstetrics                            Anesthesia Physical Anesthesia Plan  ASA: IV  Anesthesia Plan: General   Post-op Pain Management:    Induction: Intravenous  PONV Risk Score and Plan: 3 and Treatment may vary due to age or medical condition, Ondansetron and Dexamethasone  Airway Management Planned: Nasal ETT  Additional Equipment: Arterial line  Intra-op Plan:   Post-operative Plan: Extubation in OR  Informed Consent: I have reviewed the patients History and Physical, chart, labs and discussed the procedure including the risks, benefits and alternatives for the proposed anesthesia with the patient or authorized representative who has indicated his/her understanding and acceptance.   Dental advisory given  Plan Discussed with: CRNA  Anesthesia Plan Comments:         Anesthesia Quick Evaluation

## 2016-11-29 NOTE — Discharge Instructions (Signed)

## 2016-11-30 ENCOUNTER — Encounter (HOSPITAL_COMMUNITY): Payer: Self-pay | Admitting: Dentistry

## 2016-12-12 ENCOUNTER — Ambulatory Visit (HOSPITAL_COMMUNITY): Payer: Self-pay | Admitting: Dentistry

## 2016-12-13 ENCOUNTER — Ambulatory Visit (HOSPITAL_COMMUNITY): Payer: Medicaid - Dental | Admitting: Dentistry

## 2016-12-13 ENCOUNTER — Encounter (HOSPITAL_COMMUNITY): Payer: Self-pay | Admitting: Dentistry

## 2016-12-13 VITALS — BP 139/50 | HR 55 | Temp 97.5°F

## 2016-12-13 DIAGNOSIS — K08409 Partial loss of teeth, unspecified cause, unspecified class: Secondary | ICD-10-CM

## 2016-12-13 DIAGNOSIS — K08199 Complete loss of teeth due to other specified cause, unspecified class: Secondary | ICD-10-CM

## 2016-12-13 DIAGNOSIS — M264 Malocclusion, unspecified: Secondary | ICD-10-CM

## 2016-12-13 DIAGNOSIS — K0530 Chronic periodontitis, unspecified: Secondary | ICD-10-CM

## 2016-12-13 DIAGNOSIS — I350 Nonrheumatic aortic (valve) stenosis: Secondary | ICD-10-CM

## 2016-12-13 DIAGNOSIS — Z01818 Encounter for other preprocedural examination: Secondary | ICD-10-CM

## 2016-12-13 DIAGNOSIS — K029 Dental caries, unspecified: Secondary | ICD-10-CM

## 2016-12-13 MED ORDER — CHLORHEXIDINE GLUCONATE 0.12 % MT SOLN
OROMUCOSAL | 99 refills | Status: DC
Start: 2016-12-13 — End: 2018-02-01

## 2016-12-13 NOTE — Progress Notes (Signed)
POST OPERATIVE NOTE:  12/13/2016 Hilton SinclairWillie O Hiraldo [id number]  VITALS: BP (!) 139/50 (BP Location: Left Arm)   Pulse (!) 55   Temp (!) 97.5 F (36.4 C) (Oral)   LABS:  Lab Results  Component Value Date   WBC 6.2 11/29/2016   HGB 14.4 11/29/2016   HCT 45.2 11/29/2016   MCV 91.3 11/29/2016   PLT 230 11/29/2016   BMET    Component Value Date/Time   NA 138 11/29/2016 0813   NA 141 11/04/2016 1012   K 3.9 11/29/2016 0813   CL 108 11/29/2016 0813   CO2 24 11/29/2016 0813   GLUCOSE 117 (H) 11/29/2016 0813   BUN 8 11/29/2016 0813   BUN 13 11/04/2016 1012   CREATININE 0.91 11/29/2016 0813   CALCIUM 8.9 11/29/2016 0813   GFRNONAA >60 11/29/2016 0813   GFRAA >60 11/29/2016 0813    Lab Results  Component Value Date   INR 1.0 11/04/2016   No results found for: PTT   Hilton SinclairWillie O Neighbors is status post multiple extractions with alveoloplasty and gross debridement of the remaining intention in the operating room with general anesthesia on 11/29/2016.  The patient now presents for evaluation of healing  SUBJECTIVE: Patient with minimal complaints from dental extraction sites.  Patient has several stitches that still remain.  EXAM: There is no sign of infection, heme, or ooze.  Sutures are loosely intact.  Patient is healing in by generalized primary closure. Chronic periodontitis as before with plaque accumulations noted.  Oral hygiene improvement highly suggested.  Will prescribe chlorhexidine rinses-today.  Dental caries as per previous evaluation.  Patient will need follow-up with his general dentist for evaluation for restoration of dental caries.  PROCEDURE: The patient was given a chlorhexidine gluconate rinse for 30 seconds. Sutures were then removed without complication. Patient tolerated the procedure well.  ASSESSMENT: Post operative course is consistent with dental procedures performed in the operating room with general anesthesia. Loss of teeth due to  extraction Chronic periodontitis Dental caries  PLAN: 1.  Use chlorhexidine rinses twice daily in a swish and spit manner.  Prescription was called into his [facility] pharmacy with refills for 1 year. 2.  Use salt water rinses every 2 hours while awake in between the chlorhexidine rinses. 3.  Advance diet as tolerated.   4.  Follow-up with his primary dentist for evaluation for dental restorations and continued periodontal therapy. 5.  Evaluate need for antibiotic premedication prior to invasive dental procedures due to severe aortic stenosis and anticipated heart valve surgery.     Charlynne Panderonald F. Dillyn Joaquin, DDS

## 2016-12-13 NOTE — Patient Instructions (Addendum)
PLAN: 1.  Use chlorhexidine rinses twice daily in a swish and spit manner.  Prescription was called into his [facility] pharmacy with refills for 1 year. 2.  Use salt water rinses every 2 hours while awake in between the chlorhexidine rinses. 3.  Advance diet as tolerated.   4.  Follow-up with his primary dentist for evaluation for dental restorations and continued periodontal therapy. 5.  Evaluate need for antibiotic premedication prior to invasive dental procedures due to severe aortic stenosis and anticipated heart valve surgery.     Charlynne Panderonald F. Kulinski, DDS

## 2016-12-28 ENCOUNTER — Ambulatory Visit: Payer: Self-pay | Admitting: Surgery

## 2017-01-11 ENCOUNTER — Encounter: Payer: Self-pay | Admitting: Surgery

## 2017-01-11 ENCOUNTER — Other Ambulatory Visit: Payer: Self-pay

## 2017-01-11 ENCOUNTER — Ambulatory Visit (INDEPENDENT_AMBULATORY_CARE_PROVIDER_SITE_OTHER): Payer: Medicare Other | Admitting: Surgery

## 2017-01-11 VITALS — BP 152/50 | HR 62 | Resp 18 | Ht 69.0 in | Wt 166.2 lb

## 2017-01-11 DIAGNOSIS — I350 Nonrheumatic aortic (valve) stenosis: Secondary | ICD-10-CM | POA: Diagnosis not present

## 2017-01-12 ENCOUNTER — Other Ambulatory Visit: Payer: Self-pay | Admitting: *Deleted

## 2017-01-12 DIAGNOSIS — I350 Nonrheumatic aortic (valve) stenosis: Secondary | ICD-10-CM

## 2017-01-15 ENCOUNTER — Encounter: Payer: Self-pay | Admitting: Surgery

## 2017-01-15 NOTE — Progress Notes (Signed)
HPI:  The patient returns for follow-up after recent dental extractions by Dr. Kristin BruinsKulinski on 11/29/2016.  He did well with this and wants to proceed with aortic valve replacement around the third week in January.  This 66 year old gentleman has stage C asymptomatic severe aortic stenosis with moderate aortic insufficiency and moderate mitral regurgitation. His left ventricular systolic function is normal but he does have grade 1 diastolic dysfunction and his left ventricular internal dimension during diastole has increased to 54 mm.  His mitral valve has some calcification and thickening of the leaflets. His cardiac catheterization shows nonobstructive coronary disease with a chronically occluded left circumflex stent.  He does not require any coronary bypasses.    Current Outpatient Medications  Medication Sig Dispense Refill  . aspirin 81 MG chewable tablet Chew 81 mg by mouth daily.    . chlorhexidine (PERIDEX) 0.12 % solution Rinse with 15 mls twice daily for 30 seconds. Use after breakfast and at bedtime. Spit out excess. Do not swallow. 960 mL prn  . lisinopril (PRINIVIL,ZESTRIL) 5 MG tablet Take 1 tablet (5 mg total) by mouth daily. 30 tablet 6  . Menthol, Topical Analgesic, (ICY HOT EX) Apply 1 application daily as needed topically (pain).    . metFORMIN (GLUCOPHAGE) 1000 MG tablet Take 1,000 mg by mouth 2 (two) times daily with a meal.    . metoprolol tartrate (LOPRESSOR) 25 MG tablet TAKE 1/2 TABLET BY MOUTH TWICE DAILY 60 tablet 0  . naproxen (NAPROSYN) 500 MG tablet Take 1 tablet (500 mg total) by mouth 2 (two) times daily with a meal. (Patient taking differently: Take 500 mg 2 (two) times daily as needed by mouth for mild pain. ) 60 tablet 5  . naproxen sodium (ALEVE) 220 MG tablet Take 220 mg 2 (two) times daily as needed by mouth (pain).    . nitroGLYCERIN (NITROSTAT) 0.4 MG SL tablet Place 0.4 mg every 5 (five) minutes as needed under the tongue for chest pain.     . simvastatin  (ZOCOR) 40 MG tablet Take 40 mg by mouth at bedtime.      . tamsulosin (FLOMAX) 0.4 MG CAPS capsule Take 0.4 mg by mouth daily.      No current facility-administered medications for this visit.      Physical Exam: BP (!) 152/50 (BP Location: Right Arm, Patient Position: Sitting, Cuff Size: Large)   Pulse 62   Resp 18   Ht 5\' 9"  (1.753 m)   Wt 166 lb 3.2 oz (75.4 kg)   SpO2 97% Comment: on RA  BMI 24.54 kg/m  He looks well. Cardiac exam shows a regular rate and rhythm with 3/6 systolic murmur along the right sternal border Lungs are clear  Diagnostic Tests: None today  Impression:  I think the best option for him is to have his valve replaced before he has progressive left ventricular deterioration given his combination of severe aortic stenosis and moderate aortic insufficiency.  I do not think he would be a candidate for TAVR since his operative risk is low and he is only 66 years old.  I think open surgical aortic valve replacement using a bioprosthetic valve is the best option for him. I discussed the operative procedure with the patient  including alternatives, benefits and risks; including but not limited to bleeding, blood transfusion, infection, stroke, myocardial infarction, graft failure, heart block requiring a permanent pacemaker, organ dysfunction, and death.  Sean SinclairWillie O [name] understands and agrees to proceed.  Plan:  Aortic valve replacement on 02/06/2017.  I spent 15 minutes performing this established patient evaluation and > 50% of this time was spent face to face counseling and coordinating the care of this patient's severe aortic stenosis.    Alleen BorneBryan K Bartle, MD [facility] [phone]

## 2017-02-02 ENCOUNTER — Encounter (HOSPITAL_COMMUNITY): Payer: Self-pay

## 2017-02-02 ENCOUNTER — Encounter (HOSPITAL_COMMUNITY)
Admission: RE | Admit: 2017-02-02 | Discharge: 2017-02-02 | Disposition: A | Discharge status: Home / Self Care | Payer: Medicare Other | Source: Ambulatory Visit | Attending: Surgery | Admitting: Surgery

## 2017-02-02 ENCOUNTER — Ambulatory Visit (HOSPITAL_COMMUNITY)
Admission: RE | Admit: 2017-02-02 | Discharge: 2017-02-02 | Disposition: A | Discharge status: Home / Self Care | Payer: Medicare Other | Source: Ambulatory Visit | Attending: Surgery | Admitting: Surgery

## 2017-02-02 DIAGNOSIS — I6523 Occlusion and stenosis of bilateral carotid arteries: Secondary | ICD-10-CM | POA: Diagnosis not present

## 2017-02-02 DIAGNOSIS — I517 Cardiomegaly: Secondary | ICD-10-CM | POA: Insufficient documentation

## 2017-02-02 DIAGNOSIS — J984 Other disorders of lung: Secondary | ICD-10-CM | POA: Diagnosis not present

## 2017-02-02 DIAGNOSIS — E119 Type 2 diabetes mellitus without complications: Secondary | ICD-10-CM | POA: Diagnosis not present

## 2017-02-02 DIAGNOSIS — I350 Nonrheumatic aortic (valve) stenosis: Secondary | ICD-10-CM

## 2017-02-02 DIAGNOSIS — Z01812 Encounter for preprocedural laboratory examination: Secondary | ICD-10-CM | POA: Diagnosis not present

## 2017-02-02 DIAGNOSIS — R001 Bradycardia, unspecified: Secondary | ICD-10-CM | POA: Insufficient documentation

## 2017-02-02 DIAGNOSIS — Z01810 Encounter for preprocedural cardiovascular examination: Secondary | ICD-10-CM | POA: Diagnosis not present

## 2017-02-02 DIAGNOSIS — E785 Hyperlipidemia, unspecified: Secondary | ICD-10-CM | POA: Insufficient documentation

## 2017-02-02 DIAGNOSIS — Z01818 Encounter for other preprocedural examination: Secondary | ICD-10-CM | POA: Diagnosis not present

## 2017-02-02 DIAGNOSIS — I2510 Atherosclerotic heart disease of native coronary artery without angina pectoris: Secondary | ICD-10-CM | POA: Insufficient documentation

## 2017-02-02 HISTORY — DX: Nonrheumatic aortic (valve) stenosis: I35.0

## 2017-02-02 LAB — URINALYSIS, ROUTINE W REFLEX MICROSCOPIC
BILIRUBIN URINE: NEGATIVE
Glucose, UA: >=500 mg/dL — AB
Hgb urine dipstick: NEGATIVE
Ketones, ur: NEGATIVE mg/dL
Leukocytes, UA: NEGATIVE
NITRITE: NEGATIVE
PROTEIN: NEGATIVE mg/dL
SPECIFIC GRAVITY, URINE: 1.02 (ref 1.005–1.030)
pH: 5 (ref 5.0–8.0)

## 2017-02-02 LAB — PULMONARY FUNCTION TEST
DL/VA % PRED: 58 %
DL/VA: 2.67 ml/min/mmHg/L
DLCO UNC % PRED: 37 %
DLCO UNC: 11.52 ml/min/mmHg
FEF 25-75 Post: 2.16 L/sec
FEF 25-75 Pre: 2.57 L/sec
FEF2575-%Change-Post: -15 %
FEF2575-%PRED-POST: 84 %
FEF2575-%PRED-PRE: 100 %
FEV1-%Change-Post: -1 %
FEV1-%Pred-Post: 83 %
FEV1-%Pred-Pre: 84 %
FEV1-Post: 2.39 L
FEV1-Pre: 2.43 L
FEV1FVC-%Change-Post: 2 %
FEV1FVC-%PRED-PRE: 101 %
FEV6-%CHANGE-POST: 0 %
FEV6-%Pred-Post: 83 %
FEV6-%Pred-Pre: 83 %
FEV6-POST: 2.98 L
FEV6-Pre: 2.99 L
FEV6FVC-%PRED-POST: 105 %
FEV6FVC-%Pred-Pre: 105 %
FVC-%Change-Post: -3 %
FVC-%PRED-POST: 79 %
FVC-%PRED-PRE: 82 %
FVC-PRE: 3.1 L
FVC-Post: 2.98 L
POST FEV6/FVC RATIO: 100 %
PRE FEV1/FVC RATIO: 78 %
Post FEV1/FVC ratio: 80 %
Pre FEV6/FVC Ratio: 100 %
RV % pred: 70 %
RV: 1.62 L
TLC % PRED: 66 %
TLC: 4.55 L

## 2017-02-02 LAB — URINALYSIS, MICROSCOPIC (REFLEX)

## 2017-02-02 LAB — COMPREHENSIVE METABOLIC PANEL
ALK PHOS: 67 U/L (ref 38–126)
ALT: 12 U/L — AB (ref 17–63)
AST: 18 U/L (ref 15–41)
Albumin: 3.6 g/dL (ref 3.5–5.0)
Anion gap: 10 (ref 5–15)
BUN: 8 mg/dL (ref 6–20)
CALCIUM: 9.2 mg/dL (ref 8.9–10.3)
CO2: 18 mmol/L — AB (ref 22–32)
CREATININE: 0.89 mg/dL (ref 0.61–1.24)
Chloride: 111 mmol/L (ref 101–111)
GFR calc non Af Amer: >60 mL/min (ref >60)
Glucose, Bld: 163 mg/dL — ABNORMAL HIGH (ref 65–99)
Potassium: 4 mmol/L (ref 3.5–5.1)
SODIUM: 139 mmol/L (ref 135–145)
Total Bilirubin: 0.5 mg/dL (ref 0.3–1.2)
Total Protein: 6.8 g/dL (ref 6.5–8.1)

## 2017-02-02 LAB — CBC
HEMATOCRIT: 41.9 % (ref 39.0–52.0)
HEMOGLOBIN: 13.8 g/dL (ref 13.0–17.0)
MCH: 29 pg (ref 26.0–34.0)
MCHC: 32.9 g/dL (ref 30.0–36.0)
MCV: 88 fL (ref 78.0–100.0)
Platelets: 188 10*3/uL (ref 150–400)
RBC: 4.76 MIL/uL (ref 4.22–5.81)
RDW: 14.1 % (ref 11.5–15.5)
WBC: 7.2 10*3/uL (ref 4.0–10.5)

## 2017-02-02 LAB — TYPE AND SCREEN
ABO/RH(D): A POS
ANTIBODY SCREEN: NEGATIVE

## 2017-02-02 LAB — PROTIME-INR
INR: 0.98
Prothrombin Time: 12.9 seconds (ref 11.4–15.2)

## 2017-02-02 LAB — BLOOD GAS, ARTERIAL
Acid-base deficit: 1.3 mmol/L (ref 0.0–2.0)
Bicarbonate: 22.1 mmol/L (ref 20.0–28.0)
Drawn by: 470591
FIO2: 21
O2 Saturation: 97.4 %
PATIENT TEMPERATURE: 98.6
PCO2 ART: 32.3 mmHg (ref 32.0–48.0)
PH ART: 7.45 (ref 7.350–7.450)
PO2 ART: 96.6 mmHg (ref 83.0–108.0)

## 2017-02-02 LAB — GLUCOSE, CAPILLARY: Glucose-Capillary: 152 mg/dL — ABNORMAL HIGH (ref 65–99)

## 2017-02-02 LAB — APTT: aPTT: 24 seconds (ref 24–36)

## 2017-02-02 LAB — HEMOGLOBIN A1C
Hgb A1c MFr Bld: 7.9 % — ABNORMAL HIGH (ref 4.8–5.6)
MEAN PLASMA GLUCOSE: 180.03 mg/dL

## 2017-02-02 LAB — SURGICAL PCR SCREEN
MRSA, PCR: NEGATIVE
STAPHYLOCOCCUS AUREUS: NEGATIVE

## 2017-02-02 LAB — ABO/RH: ABO/RH(D): A POS

## 2017-02-02 MED ORDER — ALBUTEROL SULFATE (2.5 MG/3ML) 0.083% IN NEBU
2.5000 mg | INHALATION_SOLUTION | Freq: Once | RESPIRATORY_TRACT | Status: AC
  Administered 2017-02-02: 2.5 mg via RESPIRATORY_TRACT

## 2017-02-02 NOTE — Progress Notes (Signed)
Pre-op Cardiac Surgery  Carotid Findings:  Bilateral 1-39% ICA stenosis, antegrade vertebral flow.   Upper Extremity Right Left  Brachial Pressures 156, Tri 163, Tri  Radial Waveforms Tri Tri  Ulnar Waveforms Tri Tri  Palmar Arch (Allen's Test) waveform obliterates with radial compression and is unchanged with ulnar compression. waveform is unchanged with radial compression and obliterates with ulnar compression.   Levin Baconlaire Deandre Stansel- RDMS, RVT 11:36 AM  02/02/2017

## 2017-02-02 NOTE — Pre-Procedure Instructions (Signed)
[name]  02/02/2017      [id number] - [place], [place] - [name] [place] [address] Phone: [phone] Fax: [phone]    Your procedure is scheduled on February 06, 2017.  Report to [facility] Admitting at 0530 AM.  Call this number if you have problems the morning of surgery:  [phone]   Remember:  Do not eat food or drink liquids after midnight.  Take these medicines the morning of surgery with A SIP OF WATER metoprolol tartrate (lopressor), tamsulosin (flomax)-if needed.  7 days prior to surgery STOP taking any Aspirin (unless otherwise instructed by your surgeon), Aleve, Naproxen, Ibuprofen, Motrin, Advil, Goody's, BC's, all herbal medications, fish oil, and all vitamins  Continue all other medications as instructed by your physician except follow the above medication instructions before surgery    WHAT DO I DO ABOUT MY DIABETES MEDICATION?  [name] Kitchen Do not take oral diabetes medicines (pills) the morning of surgery metformin (glucophage).   How to Manage Your Diabetes Before and After Surgery  Why is it important to control my blood sugar before and after surgery? . Improving blood sugar levels before and after surgery helps healing and can limit problems. . A way of improving blood sugar control is eating a healthy diet by: o  Eating less sugar and carbohydrates o  Increasing activity/exercise o  Talking with your doctor about reaching your blood sugar goals . High blood sugars (greater than 180 mg/dL) can raise your risk of infections and slow your recovery, so you will need to focus on controlling your diabetes during the weeks before surgery. . Make sure that the doctor who takes care of your diabetes knows about your planned surgery including the date and location.  How do I manage my blood sugar before surgery? . Check your blood sugar at least 4 times a  day, starting 2 days before surgery, to make sure that the level is not too high or low. o Check your blood sugar the morning of your surgery when you wake up and every 2 hours until you get to the Short Stay unit. . If your blood sugar is less than 70 mg/dL, you will need to treat for low blood sugar: o Do not take insulin. o Treat a low blood sugar (less than 70 mg/dL) with  cup of clear juice (cranberry or apple), 4 glucose tablets, OR glucose gel. Recheck blood sugar in 15 minutes after treatment (to make sure it is greater than 70 mg/dL). If your blood sugar is not greater than 70 mg/dL on recheck, call [phone] o  for further instructions. . Report your blood sugar to the short stay nurse when you get to Short Stay.  . If you are admitted to the hospital after surgery: o Your blood sugar will be checked by the staff and you will probably be given insulin after surgery (instead of oral diabetes medicines) to make sure you have good blood sugar levels. o The goal for blood sugar control after surgery is 80-180 mg/dL.  Reviewed and Endorsed by [facility] Patient Education Committee, August 2015    Do not wear jewelry.  Do not wear lotions, powders, or colognes, or deodorant.             Men may shave face and neck.  Do not bring valuables to the hospital.  [facility]  is not responsible for any belongings or valuables.  Contacts, dentures or bridgework may not be worn into surgery.  Leave your suitcase in the car.  After surgery it may be brought to your room.  For patients admitted to the hospital, discharge time will be determined by your treatment team.  Patients discharged the day of surgery will not be allowed to drive home.   Special instructions:   [name]- Preparing For Surgery  Before surgery, you can play an important role. Because skin is not sterile, your skin needs to be as free of germs as possible. You can reduce the number of germs on your skin by washing  with CHG (chlorahexidine gluconate) Soap before surgery.  CHG is an antiseptic cleaner which kills germs and bonds with the skin to continue killing germs even after washing.  Please do not use if you have an allergy to CHG or antibacterial soaps. If your skin becomes reddened/irritated stop using the CHG.  Do not shave (including legs and underarms) for at least 48 hours prior to first CHG shower. It is OK to shave your face.  Please follow these instructions carefully.   1. Shower the NIGHT BEFORE SURGERY and the MORNING OF SURGERY with CHG.   2. If you chose to wash your hair, wash your hair first as usual with your normal shampoo.  3. After you shampoo, rinse your hair and body thoroughly to remove the shampoo.  4. Use CHG as you would any other liquid soap. You can apply CHG directly to the skin and wash gently with a scrungie or a clean washcloth.   5. Apply the CHG Soap to your body ONLY FROM THE NECK DOWN.  Do not use on open wounds or open sores. Avoid contact with your eyes, ears, mouth and genitals (private parts). Wash Face and genitals (private parts)  with your normal soap.  6. Wash thoroughly, paying special attention to the area where your surgery will be performed.  7. Thoroughly rinse your body with warm water from the neck down.  8. DO NOT shower/wash with your normal soap after using and rinsing off the CHG Soap.  9. Pat yourself dry with a CLEAN TOWEL.  10. Wear CLEAN PAJAMAS to bed the night before surgery, wear comfortable clothes the morning of surgery  11. Place CLEAN SHEETS on your bed the night of your first shower and DO NOT SLEEP WITH PETS.  Day of Surgery: Do not apply any deodorants/lotions. Please wear clean clothes to the hospital/surgery center.    Please read over the following fact sheets that you were given. Pain Booklet, Coughing and Deep Breathing, MRSA Information and Surgical Site Infection Prevention

## 2017-02-05 MED ORDER — TRANEXAMIC ACID (OHS) BOLUS VIA INFUSION
15.0000 mg/kg | INTRAVENOUS | Status: AC
  Administered 2017-02-06: 1125 mg via INTRAVENOUS
  Filled 2017-02-05: qty 1125

## 2017-02-05 MED ORDER — DEXTROSE 5 % IV SOLN
750.0000 mg | INTRAVENOUS | Status: DC
  Filled 2017-02-05: qty 750

## 2017-02-05 MED ORDER — SODIUM CHLORIDE 0.9 % IV SOLN
30.0000 ug/min | INTRAVENOUS | Status: AC
  Administered 2017-02-06: 10 ug/min via INTRAVENOUS
  Filled 2017-02-05: qty 2

## 2017-02-05 MED ORDER — PLASMA-LYTE 148 IV SOLN
INTRAVENOUS | Status: DC
  Filled 2017-02-05: qty 2.5

## 2017-02-05 MED ORDER — EPINEPHRINE PF 1 MG/ML IJ SOLN
0.0000 ug/min | INTRAVENOUS | Status: DC
  Filled 2017-02-05: qty 4

## 2017-02-05 MED ORDER — DEXTROSE 5 % IV SOLN
1.5000 g | INTRAVENOUS | Status: AC
  Administered 2017-02-06: 1.5 g via INTRAVENOUS
  Filled 2017-02-05: qty 1.5

## 2017-02-05 MED ORDER — MAGNESIUM SULFATE 50 % IJ SOLN
40.0000 meq | INTRAMUSCULAR | Status: DC
  Filled 2017-02-05 (×2): qty 9.85

## 2017-02-05 MED ORDER — DEXMEDETOMIDINE HCL IN NACL 400 MCG/100ML IV SOLN
0.1000 ug/kg/h | INTRAVENOUS | Status: AC
  Administered 2017-02-06: .2 ug/kg/h via INTRAVENOUS
  Filled 2017-02-05: qty 100

## 2017-02-05 MED ORDER — SODIUM CHLORIDE 0.9 % IV SOLN
INTRAVENOUS | Status: AC
  Administered 2017-02-06: 1 [IU]/h via INTRAVENOUS
  Filled 2017-02-05: qty 1

## 2017-02-05 MED ORDER — TRANEXAMIC ACID 1000 MG/10ML IV SOLN
1.5000 mg/kg/h | INTRAVENOUS | Status: AC
  Administered 2017-02-06: 1.5 mg/kg/h via INTRAVENOUS
  Filled 2017-02-05: qty 25

## 2017-02-05 MED ORDER — DOPAMINE-DEXTROSE 3.2-5 MG/ML-% IV SOLN
0.0000 ug/kg/min | INTRAVENOUS | Status: DC
  Filled 2017-02-05: qty 250

## 2017-02-05 MED ORDER — TRANEXAMIC ACID (OHS) PUMP PRIME SOLUTION
2.0000 mg/kg | INTRAVENOUS | Status: DC
  Filled 2017-02-05: qty 1.5

## 2017-02-05 MED ORDER — POTASSIUM CHLORIDE 2 MEQ/ML IV SOLN
80.0000 meq | INTRAVENOUS | Status: DC
  Filled 2017-02-05: qty 40

## 2017-02-05 MED ORDER — NITROGLYCERIN IN D5W 200-5 MCG/ML-% IV SOLN
2.0000 ug/min | INTRAVENOUS | Status: AC
  Administered 2017-02-06: 16.6 ug/min via INTRAVENOUS
  Filled 2017-02-05: qty 250

## 2017-02-05 MED ORDER — SODIUM CHLORIDE 0.9 % IV SOLN
INTRAVENOUS | Status: DC
  Filled 2017-02-05: qty 30

## 2017-02-05 MED ORDER — MILRINONE LACTATE IN DEXTROSE 20-5 MG/100ML-% IV SOLN
0.1250 ug/kg/min | INTRAVENOUS | Status: DC
  Filled 2017-02-05: qty 100

## 2017-02-05 MED ORDER — VANCOMYCIN HCL 10 G IV SOLR
1250.0000 mg | INTRAVENOUS | Status: AC
  Administered 2017-02-06: 1250 mg via INTRAVENOUS
  Filled 2017-02-05: qty 1250

## 2017-02-05 NOTE — H&P (Signed)
[address]       [place] [id number]             [phone]      Cardiothoracic Surgery Admission History and Physical   PCP is Carylon PerchesFagan, Roy, MD  Referring Provider is Kathleene HazelMcAlhany, Christopher D*      Chief Complaint  Patient presents with  . 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 He has been followed by Dr. Purvis SheffieldKoneswaran and his most recent echocardiogram on 10/26/2016 showed severe aortic stenosis with a mean gradient of 41 mmHg with a peak gradient of 86 mmHg. The aortic valve area was calculated at 1.12 cm. There was moderate aortic insufficiency and moderate mitral regurgitation. He developed brief on and off episodes of chest pain that was mild with no associated symptoms. This resolved and he has had no recurrence. He denies any shortness of breath with exertion or fatigue. He was chopping wood without any difficulty and his energy level has been good. He denies any dizziness or syncope. He has had no peripheral edema. He was seen by Dr. Clifton JamesMcAlhany and underwent cardiac catheterization on 11/14/2016 showing mild nonobstructive coronary disease in the right coronary artery and LAD territories. There was a chronic total occlusion of a mid left circumflex stent. The mean gradient across aortic valve was measured at 31 mmHg with a peak to peak gradient of 27 mmHg and an aortic valve area of 0.62 cm. His cardiac index was 2.12. Right heart pressures were normal.   He was seen by me 11/17/2016 and referred to Dr. Kristin BruinsKulinski for dental evaluation and subsequently had extractions performed on 11/29/2016. He did well with that and wanted to wait until the third week of January to have his surgery.      Past Medical History:  Diagnosis Date  . CAD (coronary  artery disease)   . Diabetes mellitus   . Hyperlipidemia   . MI (myocardial infarction) (HCC)   . S/P AKA (above knee amputation) (HCC)         Past Surgical History:  Procedure Laterality Date  . CORONARY ANGIOPLASTY WITH STENT PLACEMENT    . LEG SURGERY     Repair of left leg trauma  . RIGHT/LEFT HEART CATH AND CORONARY ANGIOGRAPHY N/A 11/14/2016   Procedure: RIGHT/LEFT HEART CATH AND CORONARY ANGIOGRAPHY; Surgeon: Kathleene HazelMcAlhany, Christopher D, MD; Location: [facility]; Service: Cardiovascular; Laterality: N/A;        Family History  Problem Relation Age of Onset  . Hypertension Mother   . Heart attack Father    Social History         Social History  Substance Use Topics  . Smoking status: Former Smoker    Packs/day: 0.30    Types: Cigars, Cigarettes    Start date: 01/18/1956    Quit date: 11/25/2015  . Smokeless tobacco: Never Used  . Alcohol use 0.0 oz/week      Comment: Occasional         Current Outpatient Prescriptions  Medication Sig Dispense Refill  . aspirin 81 MG chewable tablet Chew 81 mg by mouth daily.    [name]. HYDROcodone-acetaminophen (NORCO/VICODIN) 5-325 MG tablet Take 1 tablet by mouth every 6 (six) hours as needed for moderate  pain (Must last 30 days. Do not take and drive a car or use machinery.). 110 tablet 0  . Insulin Degludec (TRESIBA FLEXTOUCH) 200 UNIT/ML SOPN Inject 5 Units into the skin at bedtime.    [name] lisinopril (PRINIVIL,ZESTRIL) 5 MG tablet Take 1 tablet (5 mg total) by mouth daily. 30 tablet 6  . metFORMIN (GLUCOPHAGE) 1000 MG tablet Take 1,000 mg by mouth 2 (two) times daily with a meal.    . metoprolol tartrate (LOPRESSOR) 25 MG tablet TAKE 1/2 TABLET BY MOUTH TWICE DAILY 60 tablet 0  . naproxen (NAPROSYN) 500 MG tablet Take 1 tablet (500 mg total) by mouth 2 (two) times daily with a meal. (Patient taking differently: Take 220 mg by mouth 2 (two) times daily with a meal. ) 60 tablet 5  . naproxen sodium (ANAPROX) 220 MG tablet Take 220 mg by  mouth 2 (two) times daily as needed (for pain.).    [name] nitroGLYCERIN (NITROSTAT) 0.4 MG SL tablet Place 0.4 mg under the tongue every 5 (five) minutes as needed. Chest pain.    . simvastatin (ZOCOR) 40 MG tablet Take 40 mg by mouth at bedtime.     . tamsulosin (FLOMAX) 0.4 MG CAPS capsule Take 0.4 mg by mouth daily.      No current facility-administered medications for this visit.    No Known Allergies  Review of Systems  Constitutional: Negative for activity change, appetite change, fatigue and unexpected weight change.  HENT: Positive for dental problem.  Has loose teeth and has not seen dentist in 5 years.  Eyes: Negative.  Respiratory: Negative for cough, chest tightness and shortness of breath.  Cardiovascular: Negative for chest pain, palpitations and leg swelling.  Gastrointestinal: Negative.  Endocrine: Negative.  Genitourinary: Negative.  Musculoskeletal: Negative.  Skin: Negative.  Allergic/Immunologic: Negative.  Neurological: Negative for dizziness, syncope and weakness.  Hematological: Negative.  Psychiatric/Behavioral: Negative.   BP (!) 138/56 (BP Location: Left Arm, Patient Position: Sitting, Cuff Size: Large)  Pulse 70  Resp 16  Ht 5\' 9"  (1.753 m)  Wt 160 lb (72.6 kg)  SpO2 94% Comment: ON RA  BMI 23.63 kg/m  Physical Exam  Constitutional: He is oriented to person, place, and time. He appears well-developed and well-nourished. No distress.  HENT:  Head: Normocephalic and atraumatic.  Poor dentition with multiple missing and loose teeth.  Eyes: Pupils are equal, round, and reactive to light. EOM are normal.  Neck: Normal range of motion. Neck supple. No JVD present. No thyromegaly present.  Cardiovascular: Normal rate, regular rhythm, normal heart sounds and intact distal pulses.  No murmur heard.  Pulmonary/Chest: Effort normal and breath sounds normal. No respiratory distress.  Abdominal: Soft. Bowel sounds are normal. He exhibits no distension and no mass.  There is no tenderness.  Musculoskeletal: Normal range of motion. He exhibits deformity. He exhibits no edema.  Left AKA with prosthesis.  Lymphadenopathy:  He has no cervical adenopathy.  Neurological: He is alert and oriented to person, place, and time. He has normal strength. No cranial nerve deficit or sensory deficit.  Skin: Skin is warm and dry.  Psychiatric: He has a normal mood and affect.   Diagnostic Tests:  *[facility]*  [address]  [place], [place] [address]  [phone]  -------------------------------------------------------------------  Transthoracic Echocardiography  Patient: Karthikeya, Funke  MR #: [id number]  Study Date: 10/26/2016  Gender: M  Age: 30  Height: 175.3 cm  Weight: 72.1 kg  BSA: 1.88 m^2  Pt. Status:  Room:  ATTENDING Prentice Docker, MD  ORDERING Prentice Docker, MD  REFERRING Prentice Docker, MD  PERFORMING [facility]  SONOGRAPHER Celesta Gentile, RCS  cc:  -------------------------------------------------------------------  LV EF: 60% - 65%  -------------------------------------------------------------------  Indications: Aortic stenosis 424.1.  -------------------------------------------------------------------  History: PMH: Acquired from the patient and from the patient&'s  chart. Murmur. Risk factors: Diabetes mellitus. Dyslipidemia.  -------------------------------------------------------------------  Study Conclusions  - Left ventricle: The cavity size was mildly dilated. Wall  thickness was increased in a pattern of mild LVH. Systolic  function was normal. The estimated ejection fraction was in the  range of 60% to 65%. Wall motion was normal; there were no  regional wall motion abnormalities. Doppler parameters are  consistent with abnormal left ventricular relaxation (grade 1  diastolic dysfunction).  - Aortic valve: Heavily calcified aortic leaflets. There was severe  stenosis. There was moderate  regurgitation. Mean gradient (S): 41  mm Hg. Peak gradient (S): 86 mm Hg. VTI ratio of LVOT to aortic  valve: 0.36. Valve area (VTI): 1.12 cm^2.  - Mitral valve: Mildly calcified annulus. Mildly thickened, mildly  calcified leaflets . There was moderate regurgitation.  - Left atrium: The atrium was moderately dilated.  - Right atrium: Central venous pressure (est): 3 mm Hg.  - Atrial septum: No defect or patent foramen ovale was identified.  - Tricuspid valve: There was trivial regurgitation.  - Pulmonary arteries: PA peak pressure: 27 mm Hg (S).  - Pericardium, extracardiac: There was no pericardial effusion.  Impressions:  - Mild LVH with mild chamber dilatation and LVEF 60-65%. Grade 1  diastolic dysfunction. Moderate left atrial enlargement. Mildly  thickened and calcified mitral leaflets with moderate mitral  regurgitation. Heavily calcified aortic valve with evidence of  overall severe aortic stenosis and moderate aortic regurgitation  as outlined above. Valve gradients have increased in comparison  to the previous study. Trivial tricuspid regurgitation with  estimated PASP 27 mmHg.  -------------------------------------------------------------------  Study data: Comparison was made to the study of 06/29/2016. Study  status: Routine. Procedure: The patient reported no pain pre or  post test. Transthoracic echocardiography. Image quality was  adequate. Study completion: There were no complications.  Transthoracic echocardiography. M-mode, complete 2D, spectral  Doppler, and color Doppler. Birthdate: Patient birthdate:  12-06-1950. Age: Patient is 67 yr old. Sex: Gender: male.  BMI: 23.5 kg/m^2. Blood pressure: 171/69 Patient status:  Inpatient. Study date: Study date: 10/26/2016. Study time: 01:01  PM. Location: Echo laboratory.  -------------------------------------------------------------------  -------------------------------------------------------------------  Left  ventricle: The cavity size was mildly dilated. Wall thickness  was increased in a pattern of mild LVH. Systolic function was  normal. The estimated ejection fraction was in the range of 60% to  65%. Wall motion was normal; there were no regional wall motion  abnormalities. Doppler parameters are consistent with abnormal left  ventricular relaxation (grade 1 diastolic dysfunction).  -------------------------------------------------------------------  Aortic valve: Heavily calcified aortic leaflets. Doppler: There  was severe stenosis. There was moderate regurgitation. VTI  ratio of LVOT to aortic valve: 0.36. Valve area (VTI): 1.12 cm^2.  Indexed valve area (VTI): 0.6 cm^2/m^2. Peak velocity ratio of LVOT  to aortic valve: 0.36. Valve area (Vmax): 1.12 cm^2. Indexed valve  area (Vmax): 0.6 cm^2/m^2. Mean velocity ratio of LVOT to aortic  valve: 0.37. Valve area (Vmean): 1.16 cm^2. Indexed valve area  (Vmean): 0.62 cm^2/m^2. Mean gradient (S): 41 mm Hg. Peak  gradient (S): 86 mm Hg.  -------------------------------------------------------------------  Aorta: Aortic root: The aortic  root was normal in size.  -------------------------------------------------------------------  Mitral valve: Mildly calcified annulus. Mildly thickened, mildly  calcified leaflets . Doppler: There was moderate regurgitation.  Peak gradient (D): 6 mm Hg.  -------------------------------------------------------------------  Left atrium: The atrium was moderately dilated.  -------------------------------------------------------------------  Atrial septum: No defect or patent foramen ovale was identified.  -------------------------------------------------------------------  Right ventricle: The cavity size was normal. Systolic function was  normal.  -------------------------------------------------------------------  Pulmonic valve: The valve appears to be grossly normal.  Doppler: There was trivial regurgitation.   -------------------------------------------------------------------  Tricuspid valve: The valve appears to be grossly normal.  Doppler: There was trivial regurgitation.  -------------------------------------------------------------------  Right atrium: The atrium was normal in size.  -------------------------------------------------------------------  Pericardium: There was no pericardial effusion.  -------------------------------------------------------------------  Systemic veins:  Inferior vena cava: The vessel was normal in size. The  respirophasic diameter changes were in the normal range (>= 50%),  consistent with normal central venous pressure.  -------------------------------------------------------------------  Measurements  Left ventricle Value Reference  LV ID, ED, PLAX chordal (H) 54 mm 43 - 52  LV ID, ES, PLAX chordal 32 mm 23 - 38  LV fx shortening, PLAX chordal 41 % >=29  LV PW thickness, ED 12.5 mm ----------  IVS/LV PW ratio, ED 0.98 <=1.3  Stroke volume, 2D 127 ml ----------  Stroke volume/bsa, 2D 68 ml/m^2 ----------  LV ejection fraction, 1-p A4C 66 % ----------  LV end-diastolic volume, 2-p 134 ml ----------  LV end-systolic volume, 2-p 50 ml ----------  LV ejection fraction, 2-p 63 % ----------  Stroke volume, 2-p 84 ml ----------  LV end-diastolic volume/bsa, 2-p 71 ml/m^2 ----------  LV end-systolic volume/bsa, 2-p 27 ml/m^2 ----------  Stroke volume/bsa, 2-p 44.8 ml/m^2 ----------  LV e&', lateral 6.31 cm/s ----------  LV E/e&', lateral 20.13 ----------  LV e&', medial 7.51 cm/s ----------  LV E/e&', medial 16.91 ----------  LV e&', average 6.91 cm/s ----------  LV E/e&', average 18.38 ----------  Ventricular septum Value Reference  IVS thickness, ED 12.3 mm ----------  LVOT Value Reference  LVOT ID, S 20 mm ----------  LVOT area 3.14 cm^2 ----------  LVOT peak velocity, S 165 cm/s ----------  LVOT mean velocity, S 107 cm/s ----------  LVOT VTI, S  40.3 cm ----------  LVOT peak gradient, S 11 mm Hg ----------  Aortic valve Value Reference  Aortic valve peak velocity, S 463 cm/s ----------  Aortic valve mean velocity, S 289 cm/s ----------  Aortic valve VTI, S 113 cm ----------  Aortic mean gradient, S 41 mm Hg ----------  Aortic peak gradient, S 86 mm Hg ----------  VTI ratio, LVOT/AV 0.36 ----------  Aortic valve area, VTI 1.12 cm^2 ----------  Aortic valve area/bsa, VTI 0.6 cm^2/m^2 ----------  Velocity ratio, peak, LVOT/AV 0.36 ----------  Aortic valve area, peak velocity 1.12 cm^2 ----------  Aortic valve area/bsa, peak 0.6 cm^2/m^2 ----------  velocity  Velocity ratio, mean, LVOT/AV 0.37 ----------  Aortic valve area, mean velocity 1.16 cm^2 ----------  Aortic valve area/bsa, mean 0.62 cm^2/m^2 ----------  velocity  Aortic regurg pressure half-time 375 ms ----------  Aorta Value Reference  Aortic root ID, ED 35 mm ----------  Left atrium Value Reference  LA ID, A-P, ES 40 mm ----------  LA ID/bsa, A-P 2.13 cm/m^2 <=2.2  LA volume, S 65.5 ml ----------  LA volume/bsa, S 34.9 ml/m^2 ----------  LA volume, ES, 1-p A4C 78.1 ml ----------  LA volume/bsa, ES, 1-p A4C 41.6 ml/m^2 ----------  LA volume, ES, 1-p A2C 48.7 ml ----------  LA volume/bsa, ES, 1-p A2C 25.9 ml/m^2 ----------  Mitral valve Value Reference  Mitral E-wave peak velocity 127 cm/s ----------  Mitral A-wave peak velocity 159 cm/s ----------  Mitral deceleration time 229 ms 150 - 230  Mitral peak gradient, D 6 mm Hg ----------  Mitral E/A ratio, peak 0.8 ----------  Pulmonary arteries Value Reference  PA pressure, S, DP 27 mm Hg <=30  Tricuspid valve Value Reference  Tricuspid regurg peak velocity 243 cm/s ----------  Tricuspid peak RV-RA gradient 24 mm Hg ----------  Right atrium Value Reference  RA ID, S-I, ES, A4C 44.7 mm 34 - 49  RA area, ES, A4C 13.8 cm^2 8.3 - 19.5  RA volume, ES, A/L 35.9 ml ----------  RA volume/bsa, ES, A/L 19.1 ml/m^2  ----------  Systemic veins Value Reference  Estimated CVP 3 mm Hg ----------  Right ventricle Value Reference  RV ID, ED, PLAX 31.8 mm 19 - 38  TAPSE 16.5 mm ----------  RV pressure, S, DP 27 mm Hg <=30  RV s&', lateral, S 9.36 cm/s ----------  Legend:  (L) and (H) mark values outside specified reference range.  -------------------------------------------------------------------  Prepared and Electronically Authenticated by  Nona Dell, M.D.  2018-10-10T15:57:17  [facility]  CARDIAC CATHETERIZATION  Order# [id number]  Reading physician: Kathleene Hazel, MD Ordering physician: Kathleene Hazel, MD Study date: 11/14/16  Physicians  Panel Physicians Referring Physician Case Authorizing Physician  Kathleene Hazel, MD (Primary)    Procedures  RIGHT/LEFT HEART CATH AND CORONARY ANGIOGRAPHY  Conclusion  Mid RCA lesion, 40 %stenosed.  Dist RCA lesion, 30 %stenosed.  Ost RPDA lesion, 20 %stenosed.  Ost LM lesion, 20 %stenosed.  Mid Cx lesion, 100 %stenosed.  There is severe aortic valve stenosis.  Mid LAD to Dist LAD lesion, 10 %stenosed. 1. Mild non-obstructive disease in the RCA, LAD and intermediate branches.  2. 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  3. Severe aortic valve stenosis (mean gradient 31 mmHg, peak to peak gradient 27 mmHg, AVA 0.62 cm2)  Recommendations: will continue planning for AVR. He will be seen in our CT surgery office. I think the surgical consult should be obtained before we perform CT scans as he may need surgical AVR.   Indications  Severe aortic stenosis [I35.0 (ICD-10-CM)]  Procedural Details/Technique  Technical Details Indication: 67 yo male with history of CAD, DM and severe aortic stenosis, moderate AI, moderate MR with recent worsened chest pressure/pain.   Procedure: The risks, benefits, complications, treatment options, and expected outcomes were discussed with the patient. The patient  and/or family concurred with the proposed plan, giving informed consent. The patient was brought to the cath lab after IV hydration was given. The patient was further sedated with Versed. The right groin was prepped and draped. A 7 French sheath was placed in the right femoral vein under u/s guidance. Balloon tipped catheter used to perform right heart catheterization. The right wrist was prepped and draped in a sterile fashion. 1% lidocaine was used for local anesthesia. Using the modified Seldinger access technique, a 5 French sheath was placed in the right radial artery. 3 mg Verapamil was given through the sheath. 3500 units IV heparin was given. Standard diagnostic catheters were used to perform selective coronary angiography. I engaged the RCA with a JR4 catheter. I engaged the left main artery with an AL-2 catheter. I crossed the aortic valve with an AL-2 catheter and a straight wire. LV pressures measured. No LV gram performed. The  sheath was removed from the right radial artery and a Terumo hemostasis band was applied at the arteriotomy site on the right wrist.    Estimated blood loss <50 mL.  During this procedure the patient was administered the following to achieve and maintain moderate conscious sedation: Versed 1 mg, while the patient's heart rate, blood pressure, and oxygen saturation were continuously monitored. The period of conscious sedation was 50 minutes, of which I was present face-to-face 100% of this time.  Complications  Complications documented before study signed (11/14/2016 1:51 PM EDT)   RIGHT/LEFT HEART CATH AND CORONARY ANGIOGRAPHY   None Documented by Kathleene Hazel, MD 11/14/2016 1:50 PM EDT  Time Range: Intra-procedure    Coronary Findings  Dominance: Right  Left Main  Ost LM lesion, 20% stenosed.  Left Anterior Descending  Vessel is large.  Mid LAD to Dist LAD lesion, 10% stenosed.  First Diagonal Branch  Vessel is moderate in size.  First Septal  Branch  Vessel is small in size.  Second Diagonal Branch  Vessel is small in size.  Second Septal Branch  Vessel is small in size.  Third Diagonal Branch  Vessel is small in size.  Third Septal Branch  Vessel is small in size.  Ramus Intermedius  Vessel is moderate in size.  Left Circumflex  Mid Cx lesion, 100% stenosed. The lesion is chronically occluded. The lesion was previously treated using a bare metal stent over 2 years ago.  Second Obtuse Marginal Branch  Vessel is small in size.  Right Coronary Artery  Mid RCA lesion, 40% stenosed.  Dist RCA lesion, 30% stenosed.  Right Posterior Descending Artery  Ost RPDA lesion, 20% stenosed.  Right Heart  Right Heart Pressures Elevated LV EDP consistent with volume overload.  Left Heart  Aortic Valve There is severe aortic valve stenosis. The aortic valve is calcified.  Coronary Diagrams  Diagnostic Diagram     Implants     No implant documentation for this case.  MERGE Images  Link to Procedure Log   Show images for CARDIAC CATHETERIZATION Procedure Log  Hemo Data   Most Recent Value  Fick Cardiac Output 3.98 L/min  Fick Cardiac Output Index 2.12 (L/min)/BSA  Aortic Mean Gradient 31.1 mmHg  Aortic Peak Gradient 27 mmHg  Aortic Valve Area 0.62  Aortic Value Area Index 0.33 cm2/BSA  RA A Wave 8 mmHg  RA V Wave 6 mmHg  RA Mean 5 mmHg  RV Systolic Pressure 44 mmHg  RV Diastolic Pressure 4 mmHg  RV EDP 8 mmHg  PA Systolic Pressure 37 mmHg  PA Diastolic Pressure 15 mmHg  PA Mean 22 mmHg  PW A Wave 17 mmHg  PW V Wave 13 mmHg  PW Mean 12 mmHg  AO Systolic Pressure 119 mmHg  AO Diastolic Pressure 46 mmHg  AO Mean 72 mmHg  LV Systolic Pressure 179 mmHg  LV Diastolic Pressure 13 mmHg  LV EDP 22 mmHg  Arterial Occlusion Pressure Extended Systolic Pressure 151 mmHg  Arterial Occlusion Pressure Extended Diastolic Pressure 51 mmHg  Arterial Occlusion Pressure Extended Mean Pressure 87 mmHg  Left Ventricular Apex Extended  Systolic Pressure 178 mmHg  Left Ventricular Apex Extended Diastolic Pressure 10 mmHg  Left Ventricular Apex Extended EDP Pressure 23 mmHg  QP/QS 1  TPVR Index 10.4 HRUI  TSVR Index 34.04 HRUI  PVR SVR Ratio 0.15  TPVR/TSVR Ratio 0.31    Impression:   This 67 year old gentleman has stage C asymptomatic severe aortic stenosis with moderate  aortic insufficiency and moderate mitral regurgitation. I reviewed the symptoms of aortic stenosis multiple times with him and he denies any symptoms other than the brief episodes of chest pain that he had one day a few weeks ago that have resolved and not returned. He has been chopping wood without any difficulty. I have personally reviewed and interpreted his recent echocardiogram and cardiac catheterization. His left ventricular systolic function is normal but he does have grade 1 diastolic dysfunction and his left ventricular internal dimension during diastole has increased to 54 mm. His mitral valve has some calcification and thickening of the leaflets. There is fluttering of the anterior leaflet during diastole related to his aortic insufficiency and I wonder if that is interfering with closure of the valve. His mitral regurgitation looks more mild to me. His cardiac catheterization shows nonobstructive coronary disease with a chronically occluded left circumflex stent. He does not require any coronary bypasses. I think the best option for him is to have his valve replaced before he has progressive left ventricular deterioration given his combination of severe aortic stenosis and moderate aortic insufficiency. I do not think he would be a candidate for TAVR since his operative risk is low and he is only 67 years old. I discussed this with him and he seems to understand the reasoning. I discussed open surgical aortic valve replacement with him and my recommendation for using a bioprosthetic valve. I discussed the operative procedure with the patient including  alternatives, benefits and risks; including but not limited to bleeding, blood transfusion, infection, stroke, myocardial infarction, graft failure, heart block requiring a permanent pacemaker, organ dysfunction, and death.  [name] understands and agrees to proceed.     Plan:   Surgical aortic valve replacement using a bioprosthetic valve.  Alleen Borne, MD  [facility]  [phone]

## 2017-02-06 ENCOUNTER — Inpatient Hospital Stay (HOSPITAL_COMMUNITY)
Admission: RE | Admit: 2017-02-06 | Discharge: 2017-02-06 | Disposition: A | Discharge status: Home / Self Care | Payer: Medicare Other | Source: Ambulatory Visit | Attending: Surgery | Admitting: Surgery

## 2017-02-06 ENCOUNTER — Encounter (HOSPITAL_COMMUNITY)
Admission: RE | Disposition: A | Discharge status: Skilled Nursing Facility | Payer: Self-pay | Source: Ambulatory Visit | Attending: Surgery

## 2017-02-06 ENCOUNTER — Inpatient Hospital Stay (HOSPITAL_COMMUNITY): Payer: Medicare Other | Admitting: Anesthesiology

## 2017-02-06 ENCOUNTER — Inpatient Hospital Stay (HOSPITAL_COMMUNITY)
Admission: RE | Admit: 2017-02-06 | Discharge: 2017-02-13 | DRG: 220 | Disposition: A | Discharge status: Skilled Nursing Facility | Payer: Medicare Other | Source: Ambulatory Visit | Attending: Surgery | Admitting: Surgery

## 2017-02-06 ENCOUNTER — Inpatient Hospital Stay (HOSPITAL_COMMUNITY): Payer: Medicare Other

## 2017-02-06 ENCOUNTER — Encounter (HOSPITAL_COMMUNITY): Payer: Self-pay | Admitting: *Deleted

## 2017-02-06 DIAGNOSIS — R0989 Other specified symptoms and signs involving the circulatory and respiratory systems: Secondary | ICD-10-CM | POA: Diagnosis not present

## 2017-02-06 DIAGNOSIS — E119 Type 2 diabetes mellitus without complications: Secondary | ICD-10-CM | POA: Diagnosis not present

## 2017-02-06 DIAGNOSIS — I2510 Atherosclerotic heart disease of native coronary artery without angina pectoris: Secondary | ICD-10-CM | POA: Diagnosis not present

## 2017-02-06 DIAGNOSIS — Z7984 Long term (current) use of oral hypoglycemic drugs: Secondary | ICD-10-CM | POA: Diagnosis not present

## 2017-02-06 DIAGNOSIS — D500 Iron deficiency anemia secondary to blood loss (chronic): Secondary | ICD-10-CM | POA: Diagnosis not present

## 2017-02-06 DIAGNOSIS — Z794 Long term (current) use of insulin: Secondary | ICD-10-CM

## 2017-02-06 DIAGNOSIS — Z89612 Acquired absence of left leg above knee: Secondary | ICD-10-CM

## 2017-02-06 DIAGNOSIS — Z791 Long term (current) use of non-steroidal anti-inflammatories (NSAID): Secondary | ICD-10-CM | POA: Diagnosis not present

## 2017-02-06 DIAGNOSIS — Z7982 Long term (current) use of aspirin: Secondary | ICD-10-CM

## 2017-02-06 DIAGNOSIS — D696 Thrombocytopenia, unspecified: Secondary | ICD-10-CM | POA: Diagnosis present

## 2017-02-06 DIAGNOSIS — Z8249 Family history of ischemic heart disease and other diseases of the circulatory system: Secondary | ICD-10-CM | POA: Diagnosis not present

## 2017-02-06 DIAGNOSIS — Z87891 Personal history of nicotine dependence: Secondary | ICD-10-CM

## 2017-02-06 DIAGNOSIS — Z955 Presence of coronary angioplasty implant and graft: Secondary | ICD-10-CM | POA: Diagnosis not present

## 2017-02-06 DIAGNOSIS — Z79899 Other long term (current) drug therapy: Secondary | ICD-10-CM

## 2017-02-06 DIAGNOSIS — Z954 Presence of other heart-valve replacement: Secondary | ICD-10-CM | POA: Diagnosis not present

## 2017-02-06 DIAGNOSIS — I350 Nonrheumatic aortic (valve) stenosis: Secondary | ICD-10-CM

## 2017-02-06 DIAGNOSIS — I080 Rheumatic disorders of both mitral and aortic valves: Principal | ICD-10-CM | POA: Diagnosis present

## 2017-02-06 DIAGNOSIS — D62 Acute posthemorrhagic anemia: Secondary | ICD-10-CM | POA: Diagnosis not present

## 2017-02-06 DIAGNOSIS — I358 Other nonrheumatic aortic valve disorders: Secondary | ICD-10-CM | POA: Diagnosis not present

## 2017-02-06 DIAGNOSIS — R079 Chest pain, unspecified: Secondary | ICD-10-CM | POA: Diagnosis not present

## 2017-02-06 DIAGNOSIS — I499 Cardiac arrhythmia, unspecified: Secondary | ICD-10-CM | POA: Diagnosis not present

## 2017-02-06 DIAGNOSIS — D649 Anemia, unspecified: Secondary | ICD-10-CM | POA: Diagnosis not present

## 2017-02-06 DIAGNOSIS — E785 Hyperlipidemia, unspecified: Secondary | ICD-10-CM | POA: Diagnosis present

## 2017-02-06 DIAGNOSIS — T82898D Other specified complication of vascular prosthetic devices, implants and grafts, subsequent encounter: Secondary | ICD-10-CM

## 2017-02-06 DIAGNOSIS — K0510 Chronic gingivitis, plaque induced: Secondary | ICD-10-CM | POA: Diagnosis not present

## 2017-02-06 DIAGNOSIS — G8918 Other acute postprocedural pain: Secondary | ICD-10-CM | POA: Diagnosis not present

## 2017-02-06 DIAGNOSIS — J90 Pleural effusion, not elsewhere classified: Secondary | ICD-10-CM | POA: Diagnosis not present

## 2017-02-06 DIAGNOSIS — E118 Type 2 diabetes mellitus with unspecified complications: Secondary | ICD-10-CM | POA: Diagnosis not present

## 2017-02-06 DIAGNOSIS — E8770 Fluid overload, unspecified: Secondary | ICD-10-CM | POA: Diagnosis not present

## 2017-02-06 DIAGNOSIS — I2582 Chronic total occlusion of coronary artery: Secondary | ICD-10-CM | POA: Diagnosis present

## 2017-02-06 DIAGNOSIS — Z951 Presence of aortocoronary bypass graft: Secondary | ICD-10-CM

## 2017-02-06 DIAGNOSIS — R197 Diarrhea, unspecified: Secondary | ICD-10-CM | POA: Diagnosis not present

## 2017-02-06 DIAGNOSIS — I352 Nonrheumatic aortic (valve) stenosis with insufficiency: Secondary | ICD-10-CM | POA: Diagnosis not present

## 2017-02-06 DIAGNOSIS — Z952 Presence of prosthetic heart valve: Secondary | ICD-10-CM | POA: Diagnosis not present

## 2017-02-06 DIAGNOSIS — R350 Frequency of micturition: Secondary | ICD-10-CM | POA: Diagnosis not present

## 2017-02-06 DIAGNOSIS — I10 Essential (primary) hypertension: Secondary | ICD-10-CM | POA: Diagnosis not present

## 2017-02-06 DIAGNOSIS — Z741 Need for assistance with personal care: Secondary | ICD-10-CM | POA: Diagnosis not present

## 2017-02-06 DIAGNOSIS — I252 Old myocardial infarction: Secondary | ICD-10-CM

## 2017-02-06 DIAGNOSIS — R2689 Other abnormalities of gait and mobility: Secondary | ICD-10-CM | POA: Diagnosis not present

## 2017-02-06 DIAGNOSIS — Z298 Encounter for other specified prophylactic measures: Secondary | ICD-10-CM | POA: Diagnosis not present

## 2017-02-06 DIAGNOSIS — M6281 Muscle weakness (generalized): Secondary | ICD-10-CM | POA: Diagnosis not present

## 2017-02-06 HISTORY — PX: AORTIC VALVE REPLACEMENT: SHX41

## 2017-02-06 HISTORY — PX: TEE WITHOUT CARDIOVERSION: SHX5443

## 2017-02-06 LAB — GLUCOSE, CAPILLARY
GLUCOSE-CAPILLARY: 100 mg/dL — AB (ref 65–99)
GLUCOSE-CAPILLARY: 92 mg/dL (ref 65–99)
GLUCOSE-CAPILLARY: 132 mg/dL — AB (ref 65–99)
GLUCOSE-CAPILLARY: 133 mg/dL — AB (ref 65–99)
Glucose-Capillary: 126 mg/dL — ABNORMAL HIGH (ref 65–99)
Glucose-Capillary: 115 mg/dL — ABNORMAL HIGH (ref 65–99)
Glucose-Capillary: 115 mg/dL — ABNORMAL HIGH (ref 65–99)
Glucose-Capillary: 155 mg/dL — ABNORMAL HIGH (ref 65–99)
Glucose-Capillary: 159 mg/dL — ABNORMAL HIGH (ref 65–99)
Glucose-Capillary: 188 mg/dL — ABNORMAL HIGH (ref 65–99)

## 2017-02-06 LAB — POCT I-STAT 3, ART BLOOD GAS (G3+)
ACID-BASE DEFICIT: 1 mmol/L (ref 0.0–2.0)
ACID-BASE DEFICIT: 3 mmol/L — AB (ref 0.0–2.0)
ACID-BASE DEFICIT: 2 mmol/L (ref 0.0–2.0)
Acid-Base Excess: 3 mmol/L — ABNORMAL HIGH (ref 0.0–2.0)
Acid-Base Excess: 3 mmol/L — ABNORMAL HIGH (ref 0.0–2.0)
Acid-base deficit: 1 mmol/L (ref 0.0–2.0)
Acid-base deficit: 1 mmol/L (ref 0.0–2.0)
BICARBONATE: 24.9 mmol/L (ref 20.0–28.0)
BICARBONATE: 24.1 mmol/L (ref 20.0–28.0)
Bicarbonate: 29.1 mmol/L — ABNORMAL HIGH (ref 20.0–28.0)
Bicarbonate: 25 mmol/L (ref 20.0–28.0)
Bicarbonate: 23.3 mmol/L (ref 20.0–28.0)
Bicarbonate: 27.8 mmol/L (ref 20.0–28.0)
Bicarbonate: 23.9 mmol/L (ref 20.0–28.0)
O2 SAT: 100 %
O2 SAT: 100 %
O2 SAT: 89 %
O2 Saturation: 95 %
O2 Saturation: 87 %
O2 Saturation: 91 %
O2 Saturation: 90 %
PCO2 ART: 52.1 mmHg — AB (ref 32.0–48.0)
PCO2 ART: 44.1 mmHg (ref 32.0–48.0)
PCO2 ART: 41.1 mmHg (ref 32.0–48.0)
PCO2 ART: 42.4 mmHg (ref 32.0–48.0)
PCO2 ART: 40 mmHg (ref 32.0–48.0)
PH ART: 7.355 (ref 7.350–7.450)
PH ART: 7.426 (ref 7.350–7.450)
PH ART: 7.387 (ref 7.350–7.450)
PO2 ART: 459 mmHg — AB (ref 83.0–108.0)
PO2 ART: 269 mmHg — AB (ref 83.0–108.0)
PO2 ART: 56 mmHg — AB (ref 83.0–108.0)
Patient temperature: 37
Patient temperature: 37.2
Patient temperature: 37.4
TCO2: 31 mmol/L (ref 22–32)
TCO2: 26 mmol/L (ref 22–32)
TCO2: 26 mmol/L (ref 22–32)
TCO2: 26 mmol/L (ref 22–32)
TCO2: 24 mmol/L (ref 22–32)
TCO2: 29 mmol/L (ref 22–32)
TCO2: 25 mmol/L (ref 22–32)
pCO2 arterial: 51.2 mmHg — ABNORMAL HIGH (ref 32.0–48.0)
pCO2 arterial: 46.1 mmHg (ref 32.0–48.0)
pH, Arterial: 7.361 (ref 7.350–7.450)
pH, Arterial: 7.276 — ABNORMAL LOW (ref 7.350–7.450)
pH, Arterial: 7.338 — ABNORMAL LOW (ref 7.350–7.450)
pH, Arterial: 7.36 (ref 7.350–7.450)
pO2, Arterial: 62 mmHg — ABNORMAL LOW (ref 83.0–108.0)
pO2, Arterial: 76 mmHg — ABNORMAL LOW (ref 83.0–108.0)
pO2, Arterial: 60 mmHg — ABNORMAL LOW (ref 83.0–108.0)
pO2, Arterial: 61 mmHg — ABNORMAL LOW (ref 83.0–108.0)

## 2017-02-06 LAB — CBC
HCT: 32.5 % — ABNORMAL LOW (ref 39.0–52.0)
HEMATOCRIT: 35 % — AB (ref 39.0–52.0)
HEMOGLOBIN: 11.3 g/dL — AB (ref 13.0–17.0)
Hemoglobin: 10.5 g/dL — ABNORMAL LOW (ref 13.0–17.0)
MCH: 28.6 pg (ref 26.0–34.0)
MCH: 28.5 pg (ref 26.0–34.0)
MCHC: 32.3 g/dL (ref 30.0–36.0)
MCHC: 32.3 g/dL (ref 30.0–36.0)
MCV: 88.6 fL (ref 78.0–100.0)
MCV: 88.1 fL (ref 78.0–100.0)
Platelets: 102 10*3/uL — ABNORMAL LOW (ref 150–400)
Platelets: 131 10*3/uL — ABNORMAL LOW (ref 150–400)
RBC: 3.95 MIL/uL — AB (ref 4.22–5.81)
RBC: 3.69 MIL/uL — ABNORMAL LOW (ref 4.22–5.81)
RDW: 14.4 % (ref 11.5–15.5)
RDW: 14.5 % (ref 11.5–15.5)
WBC: 11.7 10*3/uL — AB (ref 4.0–10.5)
WBC: 13.4 10*3/uL — ABNORMAL HIGH (ref 4.0–10.5)

## 2017-02-06 LAB — ECHO TEE
AV Area VTI index: 0.8 cm2/m2
AV Area VTI: 1.34 cm2
AV VEL mean LVOT/AV: 0.53
AV area mean vel ind: 0.79 cm2/m2
AV vel: 1.53
AVA: 1.53 cm2
AVAREAMEANV: 1.51 cm2
AVG: 23 mmHg
AVLVOTPG: 8 mmHg
AVPG: 36 mmHg
AVPKVEL: 302 cm/s
Ao pk vel: 0.47 m/s
CHL CUP AV PEAK INDEX: 0.7
CHL CUP DOP CALC LVOT VTI: 37.3 cm
DOP CAL AO MEAN VELOCITY: 203 cm/s
LDCA: 2.84 cm2
LVOT SV: 106 mL
LVOT diameter: 19 mm
LVOT peak VTI: 0.54 cm
LVOT peak vel: 142 cm/s
P 1/2 time: 421 ms
VTI: 69.1 cm
Valve area index: 0.8

## 2017-02-06 LAB — POCT I-STAT, CHEM 8
BUN: 15 mg/dL (ref 6–20)
BUN: 13 mg/dL (ref 6–20)
BUN: 14 mg/dL (ref 6–20)
BUN: 14 mg/dL (ref 6–20)
BUN: 14 mg/dL (ref 6–20)
BUN: 10 mg/dL (ref 6–20)
CALCIUM ION: 0.98 mmol/L — AB (ref 1.15–1.40)
CALCIUM ION: 1.17 mmol/L (ref 1.15–1.40)
CHLORIDE: 106 mmol/L (ref 101–111)
CHLORIDE: 102 mmol/L (ref 101–111)
CHLORIDE: 103 mmol/L (ref 101–111)
CHLORIDE: 103 mmol/L (ref 101–111)
CREATININE: 0.7 mg/dL (ref 0.61–1.24)
CREATININE: 0.8 mg/dL (ref 0.61–1.24)
Calcium, Ion: 1.25 mmol/L (ref 1.15–1.40)
Calcium, Ion: 1.22 mmol/L (ref 1.15–1.40)
Calcium, Ion: 1.07 mmol/L — ABNORMAL LOW (ref 1.15–1.40)
Calcium, Ion: 1.05 mmol/L — ABNORMAL LOW (ref 1.15–1.40)
Chloride: 106 mmol/L (ref 101–111)
Chloride: 107 mmol/L (ref 101–111)
Creatinine, Ser: 0.8 mg/dL (ref 0.61–1.24)
Creatinine, Ser: 0.6 mg/dL — ABNORMAL LOW (ref 0.61–1.24)
Creatinine, Ser: 0.7 mg/dL (ref 0.61–1.24)
Creatinine, Ser: 0.8 mg/dL (ref 0.61–1.24)
Glucose, Bld: 136 mg/dL — ABNORMAL HIGH (ref 65–99)
Glucose, Bld: 126 mg/dL — ABNORMAL HIGH (ref 65–99)
Glucose, Bld: 148 mg/dL — ABNORMAL HIGH (ref 65–99)
Glucose, Bld: 177 mg/dL — ABNORMAL HIGH (ref 65–99)
Glucose, Bld: 189 mg/dL — ABNORMAL HIGH (ref 65–99)
Glucose, Bld: 146 mg/dL — ABNORMAL HIGH (ref 65–99)
HCT: 37 % — ABNORMAL LOW (ref 39.0–52.0)
HCT: 29 % — ABNORMAL LOW (ref 39.0–52.0)
HCT: 32 % — ABNORMAL LOW (ref 39.0–52.0)
HEMATOCRIT: 28 % — AB (ref 39.0–52.0)
HEMATOCRIT: 35 % — AB (ref 39.0–52.0)
HEMATOCRIT: 28 % — AB (ref 39.0–52.0)
HEMOGLOBIN: 12.6 g/dL — AB (ref 13.0–17.0)
HEMOGLOBIN: 10.9 g/dL — AB (ref 13.0–17.0)
Hemoglobin: 11.9 g/dL — ABNORMAL LOW (ref 13.0–17.0)
Hemoglobin: 9.5 g/dL — ABNORMAL LOW (ref 13.0–17.0)
Hemoglobin: 9.5 g/dL — ABNORMAL LOW (ref 13.0–17.0)
Hemoglobin: 9.9 g/dL — ABNORMAL LOW (ref 13.0–17.0)
POTASSIUM: 4.3 mmol/L (ref 3.5–5.1)
POTASSIUM: 4.8 mmol/L (ref 3.5–5.1)
POTASSIUM: 5 mmol/L (ref 3.5–5.1)
Potassium: 4.6 mmol/L (ref 3.5–5.1)
Potassium: 5 mmol/L (ref 3.5–5.1)
Potassium: 3.9 mmol/L (ref 3.5–5.1)
SODIUM: 144 mmol/L (ref 135–145)
SODIUM: 142 mmol/L (ref 135–145)
SODIUM: 143 mmol/L (ref 135–145)
SODIUM: 139 mmol/L (ref 135–145)
SODIUM: 141 mmol/L (ref 135–145)
Sodium: 139 mmol/L (ref 135–145)
TCO2: 26 mmol/L (ref 22–32)
TCO2: 25 mmol/L (ref 22–32)
TCO2: 28 mmol/L (ref 22–32)
TCO2: 26 mmol/L (ref 22–32)
TCO2: 25 mmol/L (ref 22–32)
TCO2: 25 mmol/L (ref 22–32)

## 2017-02-06 LAB — PROTIME-INR
INR: 1.32
Prothrombin Time: 16.2 seconds — ABNORMAL HIGH (ref 11.4–15.2)

## 2017-02-06 LAB — POCT I-STAT 4, (NA,K, GLUC, HGB,HCT)
GLUCOSE: 155 mg/dL — AB (ref 65–99)
HCT: 34 % — ABNORMAL LOW (ref 39.0–52.0)
Hemoglobin: 11.6 g/dL — ABNORMAL LOW (ref 13.0–17.0)
POTASSIUM: 5 mmol/L (ref 3.5–5.1)
SODIUM: 143 mmol/L (ref 135–145)

## 2017-02-06 LAB — APTT: APTT: 28 s (ref 24–36)

## 2017-02-06 LAB — CREATININE, SERUM
CREATININE: 0.92 mg/dL (ref 0.61–1.24)
GFR calc Af Amer: >60 mL/min (ref >60)
GFR calc non Af Amer: >60 mL/min (ref >60)

## 2017-02-06 LAB — HEMOGLOBIN AND HEMATOCRIT, BLOOD
HEMATOCRIT: 28.6 % — AB (ref 39.0–52.0)
Hemoglobin: 9.3 g/dL — ABNORMAL LOW (ref 13.0–17.0)

## 2017-02-06 LAB — MAGNESIUM: MAGNESIUM: 3.3 mg/dL — AB (ref 1.7–2.4)

## 2017-02-06 LAB — PLATELET COUNT: PLATELETS: 141 10*3/uL — AB (ref 150–400)

## 2017-02-06 SURGERY — REPLACEMENT, AORTIC VALVE, OPEN
Anesthesia: General | Site: Chest

## 2017-02-06 MED ORDER — TAMSULOSIN HCL 0.4 MG PO CAPS: 0.4000 mg | ORAL_CAPSULE | Freq: Every day | ORAL | Status: DC | PRN

## 2017-02-06 MED ORDER — SODIUM BICARBONATE 4.2 % IV SOLN: 50.0000 meq | Freq: Once | INTRAVENOUS | Status: DC

## 2017-02-06 MED ORDER — SODIUM CHLORIDE 0.9 % IV SOLN
INTRAVENOUS | Status: DC
  Administered 2017-02-06: 13:00:00 via INTRAVENOUS

## 2017-02-06 MED ORDER — SODIUM CHLORIDE 0.45 % IV SOLN: INTRAVENOUS | Status: DC | PRN

## 2017-02-06 MED ORDER — MIDAZOLAM HCL 2 MG/2ML IJ SOLN
2.0000 mg | INTRAMUSCULAR | Status: DC | PRN
Start: 2017-02-06 — End: 2017-02-07
  Administered 2017-02-06: 2 mg via INTRAVENOUS
  Filled 2017-02-06: qty 2

## 2017-02-06 MED ORDER — METOPROLOL TARTRATE 25 MG/10 ML ORAL SUSPENSION: 12.5000 mg | Freq: Two times a day (BID) | ORAL | Status: DC

## 2017-02-06 MED ORDER — LACTATED RINGERS IV SOLN: 500.0000 mL | Freq: Once | INTRAVENOUS | Status: DC | PRN

## 2017-02-06 MED ORDER — ACETAMINOPHEN 160 MG/5ML PO SOLN: 1000.0000 mg | Freq: Four times a day (QID) | ORAL | Status: DC

## 2017-02-06 MED ORDER — INSULIN REGULAR BOLUS VIA INFUSION
0.0000 [IU] | Freq: Three times a day (TID) | INTRAVENOUS | Status: DC
  Administered 2017-02-07: 5 [IU] via INTRAVENOUS
  Filled 2017-02-06: qty 10

## 2017-02-06 MED ORDER — SODIUM CHLORIDE 0.9 % IV SOLN
INTRAVENOUS | Status: DC | PRN
  Administered 2017-02-06: 11:00:00 via INTRAVENOUS

## 2017-02-06 MED ORDER — HEPARIN SODIUM (PORCINE) 1000 UNIT/ML IJ SOLN
INTRAMUSCULAR | Status: DC | PRN
  Administered 2017-02-06: 27000 [IU] via INTRAVENOUS

## 2017-02-06 MED ORDER — SIMVASTATIN 40 MG PO TABS
40.0000 mg | ORAL_TABLET | Freq: Every day | ORAL | Status: DC
  Administered 2017-02-06 – 2017-02-12 (×7): 40 mg via ORAL
  Filled 2017-02-06 (×7): qty 1

## 2017-02-06 MED ORDER — SODIUM CHLORIDE 0.9 % IV SOLN
0.0000 ug/kg/h | INTRAVENOUS | Status: DC
  Filled 2017-02-06: qty 2

## 2017-02-06 MED ORDER — SODIUM CHLORIDE 0.9 % IV SOLN
250.0000 mL | INTRAVENOUS | Status: DC
Start: 2017-02-07 — End: 2017-02-08

## 2017-02-06 MED ORDER — BISACODYL 10 MG RE SUPP: 10.0000 mg | Freq: Every day | RECTAL | Status: DC

## 2017-02-06 MED ORDER — MIDAZOLAM HCL 10 MG/2ML IJ SOLN
INTRAMUSCULAR | Status: AC
  Filled 2017-02-06: qty 2

## 2017-02-06 MED ORDER — VANCOMYCIN HCL IN DEXTROSE 1-5 GM/200ML-% IV SOLN
1000.0000 mg | Freq: Once | INTRAVENOUS | Status: AC
  Administered 2017-02-06: 1000 mg via INTRAVENOUS
  Filled 2017-02-06: qty 200

## 2017-02-06 MED ORDER — SODIUM CHLORIDE 0.9 % IV SOLN
INTRAVENOUS | Status: DC
  Filled 2017-02-06: qty 1

## 2017-02-06 MED ORDER — ONDANSETRON HCL 4 MG/2ML IJ SOLN: 4.0000 mg | Freq: Four times a day (QID) | INTRAMUSCULAR | Status: DC | PRN

## 2017-02-06 MED ORDER — THROMBIN (RECOMBINANT) 5000 UNITS EX SOLR
CUTANEOUS | Status: DC | PRN
  Administered 2017-02-06: 5000 [IU] via TOPICAL

## 2017-02-06 MED ORDER — LACTATED RINGERS IV SOLN: INTRAVENOUS | Status: DC

## 2017-02-06 MED ORDER — THROMBIN 5000 UNITS EX SOLR
CUTANEOUS | Status: AC
  Filled 2017-02-06: qty 15000

## 2017-02-06 MED ORDER — OXYCODONE HCL 5 MG PO TABS
5.0000 mg | ORAL_TABLET | ORAL | Status: DC | PRN
  Administered 2017-02-06: 5 mg via ORAL
  Administered 2017-02-07 – 2017-02-08 (×3): 10 mg via ORAL
  Filled 2017-02-06 (×2): qty 2
  Filled 2017-02-06: qty 1
  Filled 2017-02-06: qty 2

## 2017-02-06 MED ORDER — BISACODYL 5 MG PO TBEC
10.0000 mg | DELAYED_RELEASE_TABLET | Freq: Every day | ORAL | Status: DC
  Administered 2017-02-07: 10 mg via ORAL
  Filled 2017-02-06: qty 2

## 2017-02-06 MED ORDER — FAMOTIDINE IN NACL 20-0.9 MG/50ML-% IV SOLN
20.0000 mg | Freq: Two times a day (BID) | INTRAVENOUS | Status: DC
  Administered 2017-02-06: 20 mg via INTRAVENOUS

## 2017-02-06 MED ORDER — METOPROLOL TARTRATE 12.5 MG HALF TABLET
12.5000 mg | ORAL_TABLET | Freq: Two times a day (BID) | ORAL | Status: DC
  Administered 2017-02-06 – 2017-02-07 (×3): 12.5 mg via ORAL
  Filled 2017-02-06 (×4): qty 1

## 2017-02-06 MED ORDER — PROPOFOL 10 MG/ML IV BOLUS
INTRAVENOUS | Status: AC
  Filled 2017-02-06: qty 20

## 2017-02-06 MED ORDER — ROCURONIUM BROMIDE 10 MG/ML (PF) SYRINGE
PREFILLED_SYRINGE | INTRAVENOUS | Status: DC | PRN
  Administered 2017-02-06: 40 mg via INTRAVENOUS
  Administered 2017-02-06 (×2): 50 mg via INTRAVENOUS

## 2017-02-06 MED ORDER — PANTOPRAZOLE SODIUM 40 MG PO TBEC
40.0000 mg | DELAYED_RELEASE_TABLET | Freq: Every day | ORAL | Status: DC
  Filled 2017-02-06: qty 1

## 2017-02-06 MED ORDER — THROMBIN (RECOMBINANT) 5000 UNITS EX SOLR
CUTANEOUS | Status: AC
  Filled 2017-02-06: qty 5000

## 2017-02-06 MED ORDER — POTASSIUM CHLORIDE 10 MEQ/50ML IV SOLN: 10.0000 meq | INTRAVENOUS | Status: AC

## 2017-02-06 MED ORDER — ALBUMIN HUMAN 5 % IV SOLN
250.0000 mL | INTRAVENOUS | Status: AC | PRN
  Administered 2017-02-06 (×2): 250 mL via INTRAVENOUS

## 2017-02-06 MED ORDER — FENTANYL CITRATE (PF) 250 MCG/5ML IJ SOLN
INTRAMUSCULAR | Status: AC
  Filled 2017-02-06: qty 25

## 2017-02-06 MED ORDER — ALBUMIN HUMAN 5 % IV SOLN
INTRAVENOUS | Status: DC | PRN
  Administered 2017-02-06: 11:00:00 via INTRAVENOUS

## 2017-02-06 MED ORDER — ACETAMINOPHEN 500 MG PO TABS
1000.0000 mg | ORAL_TABLET | Freq: Four times a day (QID) | ORAL | Status: DC
  Administered 2017-02-06 – 2017-02-08 (×6): 1000 mg via ORAL
  Filled 2017-02-06 (×6): qty 2

## 2017-02-06 MED ORDER — LACTATED RINGERS IV SOLN
INTRAVENOUS | Status: DC | PRN
  Administered 2017-02-06: 07:00:00 via INTRAVENOUS

## 2017-02-06 MED ORDER — PROTAMINE SULFATE 10 MG/ML IV SOLN
INTRAVENOUS | Status: AC
  Filled 2017-02-06: qty 5

## 2017-02-06 MED ORDER — ONDANSETRON HCL 4 MG/2ML IJ SOLN: 4.0000 mg | Freq: Once | INTRAMUSCULAR | Status: DC | PRN

## 2017-02-06 MED ORDER — SODIUM CHLORIDE 0.9 % IJ SOLN
INTRAMUSCULAR | Status: AC
Start: 2017-02-06 — End: 2017-02-06
  Filled 2017-02-06: qty 10

## 2017-02-06 MED ORDER — HYDROMORPHONE HCL 1 MG/ML IJ SOLN: 0.2500 mg | INTRAMUSCULAR | Status: DC | PRN

## 2017-02-06 MED ORDER — ROCURONIUM BROMIDE 10 MG/ML (PF) SYRINGE
PREFILLED_SYRINGE | INTRAVENOUS | Status: AC
  Filled 2017-02-06: qty 5

## 2017-02-06 MED ORDER — ACETAMINOPHEN 650 MG RE SUPP
650.0000 mg | Freq: Once | RECTAL | Status: AC
  Administered 2017-02-06: 650 mg via RECTAL

## 2017-02-06 MED ORDER — MAGNESIUM SULFATE 4 GM/100ML IV SOLN
4.0000 g | Freq: Once | INTRAVENOUS | Status: AC
  Administered 2017-02-06: 4 g via INTRAVENOUS
  Filled 2017-02-06: qty 100

## 2017-02-06 MED ORDER — PROTAMINE SULFATE 10 MG/ML IV SOLN
INTRAVENOUS | Status: AC
  Filled 2017-02-06: qty 25

## 2017-02-06 MED ORDER — MORPHINE SULFATE (PF) 4 MG/ML IV SOLN
1.0000 mg | INTRAVENOUS | Status: AC | PRN
  Administered 2017-02-06: 2 mg via INTRAVENOUS
  Administered 2017-02-07: 4 mg via INTRAVENOUS
  Filled 2017-02-06 (×2): qty 1

## 2017-02-06 MED ORDER — 0.9 % SODIUM CHLORIDE (POUR BTL) OPTIME
TOPICAL | Status: DC | PRN
  Administered 2017-02-06: 5000 mL
  Administered 2017-02-06: 1000 mL

## 2017-02-06 MED ORDER — HEMOSTATIC AGENTS (NO CHARGE) OPTIME
TOPICAL | Status: DC | PRN
  Administered 2017-02-06: 1 via TOPICAL

## 2017-02-06 MED ORDER — GELATIN ABSORBABLE MT POWD
OROMUCOSAL | Status: DC | PRN
  Administered 2017-02-06 (×3): 3 mL via TOPICAL

## 2017-02-06 MED ORDER — SODIUM CHLORIDE 0.9% FLUSH
3.0000 mL | Freq: Two times a day (BID) | INTRAVENOUS | Status: DC
Start: 2017-02-07 — End: 2017-02-08
  Administered 2017-02-07: 3 mL via INTRAVENOUS
  Administered 2017-02-07: 10 mL via INTRAVENOUS

## 2017-02-06 MED ORDER — DOCUSATE SODIUM 100 MG PO CAPS
200.0000 mg | ORAL_CAPSULE | Freq: Every day | ORAL | Status: DC
  Administered 2017-02-07: 200 mg via ORAL
  Filled 2017-02-06 (×2): qty 2

## 2017-02-06 MED ORDER — NITROGLYCERIN IN D5W 200-5 MCG/ML-% IV SOLN: 0.0000 ug/min | INTRAVENOUS | Status: DC

## 2017-02-06 MED ORDER — SODIUM BICARBONATE 8.4 % IV SOLN
50.0000 meq | Freq: Once | INTRAVENOUS | Status: AC
  Administered 2017-02-06: 50 meq via INTRAVENOUS

## 2017-02-06 MED ORDER — SODIUM CHLORIDE 0.9% FLUSH
3.0000 mL | INTRAVENOUS | Status: DC | PRN
Start: 2017-02-07 — End: 2017-02-08

## 2017-02-06 MED ORDER — THROMBIN 5000 UNITS EX SOLR
CUTANEOUS | Status: DC | PRN
  Administered 2017-02-06: 5000 [IU] via TOPICAL

## 2017-02-06 MED ORDER — MIDAZOLAM HCL 5 MG/5ML IJ SOLN
INTRAMUSCULAR | Status: DC | PRN
  Administered 2017-02-06: 2 mg via INTRAVENOUS
  Administered 2017-02-06: 1 mg via INTRAVENOUS

## 2017-02-06 MED ORDER — MORPHINE SULFATE (PF) 2 MG/ML IV SOLN: 1.0000 mg | INTRAVENOUS | Status: DC | PRN

## 2017-02-06 MED ORDER — MEPERIDINE HCL 25 MG/ML IJ SOLN: 6.2500 mg | INTRAMUSCULAR | Status: DC | PRN

## 2017-02-06 MED ORDER — CHLORHEXIDINE GLUCONATE 0.12 % MT SOLN
15.0000 mL | OROMUCOSAL | Status: AC
  Administered 2017-02-06: 15 mL via OROMUCOSAL

## 2017-02-06 MED ORDER — TRAMADOL HCL 50 MG PO TABS
50.0000 mg | ORAL_TABLET | ORAL | Status: DC | PRN
  Administered 2017-02-07 – 2017-02-08 (×3): 100 mg via ORAL
  Filled 2017-02-06 (×3): qty 2

## 2017-02-06 MED ORDER — METOPROLOL TARTRATE 5 MG/5ML IV SOLN: 2.5000 mg | INTRAVENOUS | Status: DC | PRN

## 2017-02-06 MED ORDER — SODIUM CHLORIDE 0.9 % IV SOLN: 0.0000 ug/min | INTRAVENOUS | Status: DC

## 2017-02-06 MED ORDER — MORPHINE SULFATE (PF) 4 MG/ML IV SOLN: 2.0000 mg | INTRAVENOUS | Status: DC | PRN

## 2017-02-06 MED ORDER — ASPIRIN EC 325 MG PO TBEC
325.0000 mg | DELAYED_RELEASE_TABLET | Freq: Every day | ORAL | Status: DC
  Administered 2017-02-07: 325 mg via ORAL
  Filled 2017-02-06 (×2): qty 1

## 2017-02-06 MED ORDER — METOPROLOL TARTRATE 12.5 MG HALF TABLET
12.5000 mg | ORAL_TABLET | Freq: Once | ORAL | Status: DC
  Filled 2017-02-06: qty 1

## 2017-02-06 MED ORDER — DEXTROSE 5 % IV SOLN
1.5000 g | Freq: Two times a day (BID) | INTRAVENOUS | Status: AC
  Administered 2017-02-06 – 2017-02-08 (×4): 1.5 g via INTRAVENOUS
  Filled 2017-02-06 (×4): qty 1.5

## 2017-02-06 MED ORDER — CHLORHEXIDINE GLUCONATE 0.12 % MT SOLN
15.0000 mL | Freq: Once | OROMUCOSAL | Status: AC
  Administered 2017-02-06: 15 mL via OROMUCOSAL
  Filled 2017-02-06: qty 15

## 2017-02-06 MED ORDER — METOCLOPRAMIDE HCL 5 MG/ML IJ SOLN
10.0000 mg | Freq: Four times a day (QID) | INTRAMUSCULAR | Status: AC
  Administered 2017-02-06 – 2017-02-08 (×8): 10 mg via INTRAVENOUS
  Filled 2017-02-06 (×7): qty 2

## 2017-02-06 MED ORDER — CEFUROXIME SODIUM 750 MG IJ SOLR
INTRAMUSCULAR | Status: DC | PRN
  Administered 2017-02-06: 750 mg via INTRAVENOUS

## 2017-02-06 MED ORDER — FENTANYL CITRATE (PF) 250 MCG/5ML IJ SOLN
INTRAMUSCULAR | Status: DC | PRN
  Administered 2017-02-06: 100 ug via INTRAVENOUS
  Administered 2017-02-06 (×2): 50 ug via INTRAVENOUS
  Administered 2017-02-06: 950 ug via INTRAVENOUS
  Administered 2017-02-06: 100 ug via INTRAVENOUS

## 2017-02-06 MED ORDER — ACETAMINOPHEN 160 MG/5ML PO SOLN: 650.0000 mg | Freq: Once | ORAL | Status: AC

## 2017-02-06 MED ORDER — HEPARIN SODIUM (PORCINE) 1000 UNIT/ML IJ SOLN
INTRAMUSCULAR | Status: AC
  Filled 2017-02-06: qty 1

## 2017-02-06 MED ORDER — PROPOFOL 10 MG/ML IV BOLUS
INTRAVENOUS | Status: DC | PRN
  Administered 2017-02-06: 40 mg via INTRAVENOUS

## 2017-02-06 MED ORDER — CHLORHEXIDINE GLUCONATE 4 % EX LIQD: 30.0000 mL | CUTANEOUS | Status: DC

## 2017-02-06 MED ORDER — ASPIRIN 81 MG PO CHEW: 324.0000 mg | CHEWABLE_TABLET | Freq: Every day | ORAL | Status: DC

## 2017-02-06 SURGICAL SUPPLY — 68 items
ADAPTER CARDIO PERF ANTE/RETRO (ADAPTER) ×3 IMPLANT
ADPR PRFSN 84XANTGRD RTRGD (ADAPTER) ×2
BAG DECANTER FOR FLEXI CONT (MISCELLANEOUS) ×3 IMPLANT
BLADE STERNUM SYSTEM 6 (BLADE) ×3 IMPLANT
BLADE SURG 15 STRL LF DISP TIS (BLADE) ×2 IMPLANT
BLADE SURG 15 STRL SS (BLADE) ×3
CANISTER SUCT 3000ML PPV (MISCELLANEOUS) ×3 IMPLANT
CANNULA GUNDRY RCSP 15FR (MISCELLANEOUS) ×3 IMPLANT
CATH ROBINSON RED A/P 18FR (CATHETERS) ×9 IMPLANT
CATH THORACIC 36FR (CATHETERS) ×3 IMPLANT
CATH THORACIC 36FR RT ANG (CATHETERS) ×3 IMPLANT
CONT SPEC 4OZ CLIKSEAL STRL BL (MISCELLANEOUS) ×3 IMPLANT
COVER SURGICAL LIGHT HANDLE (MISCELLANEOUS) ×3 IMPLANT
CRADLE DONUT ADULT HEAD (MISCELLANEOUS) ×3 IMPLANT
DRAPE SLUSH/WARMER DISC (DRAPES) ×3 IMPLANT
DRSG COVADERM 4X14 (GAUZE/BANDAGES/DRESSINGS) ×3 IMPLANT
ELECT CAUTERY BLADE 6.4 (BLADE) ×3 IMPLANT
ELECT REM PT RETURN 9FT ADLT (ELECTROSURGICAL) ×6
ELECTRODE REM PT RTRN 9FT ADLT (ELECTROSURGICAL) ×4 IMPLANT
FELT TEFLON 1X6 (MISCELLANEOUS) ×6 IMPLANT
GAUZE SPONGE 4X4 12PLY STRL (GAUZE/BANDAGES/DRESSINGS) ×3 IMPLANT
GAUZE SPONGE 4X4 12PLY STRL LF (GAUZE/BANDAGES/DRESSINGS) ×1 IMPLANT
GLOVE BIO SURGEON STRL SZ 6 (GLOVE) ×3 IMPLANT
GLOVE BIO SURGEON STRL SZ 6.5 (GLOVE) ×1 IMPLANT
GLOVE BIO SURGEON STRL SZ7 (GLOVE) IMPLANT
GLOVE BIO SURGEON STRL SZ7.5 (GLOVE) ×1 IMPLANT
GLOVE BIOGEL M 6.5 STRL (GLOVE) ×3 IMPLANT
GLOVE BIOGEL PI IND STRL 6 (GLOVE) IMPLANT
GLOVE BIOGEL PI INDICATOR 6 (GLOVE) ×4
GLOVE EUDERMIC 7 POWDERFREE (GLOVE) ×6 IMPLANT
GOWN STRL REUS W/ TWL LRG LVL3 (GOWN DISPOSABLE) ×8 IMPLANT
GOWN STRL REUS W/ TWL XL LVL3 (GOWN DISPOSABLE) ×2 IMPLANT
GOWN STRL REUS W/TWL LRG LVL3 (GOWN DISPOSABLE) ×24
GOWN STRL REUS W/TWL XL LVL3 (GOWN DISPOSABLE) ×3
HEART VENT LT CURVED (MISCELLANEOUS) ×3 IMPLANT
HEMOSTAT POWDER SURGIFOAM 1G (HEMOSTASIS) ×9 IMPLANT
HEMOSTAT SURGICEL 2X14 (HEMOSTASIS) ×3 IMPLANT
KIT BASIN OR (CUSTOM PROCEDURE TRAY) ×3 IMPLANT
KIT CATH CPB BARTLE (MISCELLANEOUS) ×3 IMPLANT
KIT ROOM TURNOVER OR (KITS) ×3 IMPLANT
KIT SUCTION CATH 14FR (SUCTIONS) ×3 IMPLANT
LINE VENT (MISCELLANEOUS) ×1 IMPLANT
NS IRRIG 1000ML POUR BTL (IV SOLUTION) ×18 IMPLANT
PACK E OPEN HEART (SUTURE) ×3 IMPLANT
PACK OPEN HEART (CUSTOM PROCEDURE TRAY) ×3 IMPLANT
PAD ARMBOARD 7.5X6 YLW CONV (MISCELLANEOUS) ×6 IMPLANT
SET CARDIOPLEGIA MPS 5001102 (MISCELLANEOUS) ×1 IMPLANT
SLING S3 LATERAL DISP (MISCELLANEOUS) ×1 IMPLANT
SUT BONE WAX W31G (SUTURE) ×3 IMPLANT
SUT ETHIBON 2 0 V 52N 30 (SUTURE) ×7 IMPLANT
SUT PROLENE 2 0 SH DA (SUTURE) ×1 IMPLANT
SUT PROLENE 3 0 SH DA (SUTURE) IMPLANT
SUT PROLENE 3 0 SH1 36 (SUTURE) ×4 IMPLANT
SUT PROLENE 4 0 RB 1 (SUTURE) ×9
SUT PROLENE 4-0 RB1 .5 CRCL 36 (SUTURE) ×6 IMPLANT
SUT PROLENE 6 0 C 1 30 (SUTURE) ×2 IMPLANT
SUT STEEL 6MS V (SUTURE) IMPLANT
SUT STEEL SZ 6 DBL 3X14 BALL (SUTURE) ×3 IMPLANT
SUT VIC AB 1 CTX 36 (SUTURE) ×9
SUT VIC AB 1 CTX36XBRD ANBCTR (SUTURE) ×4 IMPLANT
SYSTEM SAHARA CHEST DRAIN ATS (WOUND CARE) ×3 IMPLANT
TAPE CLOTH SURG 4X10 WHT LF (GAUZE/BANDAGES/DRESSINGS) ×1 IMPLANT
TOWEL GREEN STERILE (TOWEL DISPOSABLE) ×3 IMPLANT
TOWEL GREEN STERILE FF (TOWEL DISPOSABLE) ×3 IMPLANT
TRAY FOLEY SILVER 16FR TEMP (SET/KITS/TRAYS/PACK) ×3 IMPLANT
UNDERPAD 30X30 (UNDERPADS AND DIAPERS) ×3 IMPLANT
VALVE MAGNA EASE AORTIC 23MM (Prosthesis & Implant Heart) ×1 IMPLANT
WATER STERILE IRR 1000ML POUR (IV SOLUTION) ×6 IMPLANT

## 2017-02-06 NOTE — Progress Notes (Signed)
Initiated Open Heart Rapid Wean per Protocol 

## 2017-02-06 NOTE — Anesthesia Procedure Notes (Signed)
Procedure Name: Intubation Date/Time: 02/06/2017 7:50 AM Performed by: Kyung Rudd, CRNA Pre-anesthesia Checklist: Patient identified, Emergency Drugs available, Suction available and Patient being monitored Patient Re-evaluated:Patient Re-evaluated prior to induction Oxygen Delivery Method: Circle system utilized Preoxygenation: Pre-oxygenation with 100% oxygen Induction Type: IV induction Ventilation: Mask ventilation without difficulty Laryngoscope Size: Mac and 4 Grade View: Grade I Tube type: Oral Tube size: 8.0 mm Number of attempts: 1 Airway Equipment and Method: Stylet Placement Confirmation: ETT inserted through vocal cords under direct vision,  positive ETCO2 and breath sounds checked- equal and bilateral Secured at: 22 cm Tube secured with: Tape Dental Injury: Teeth and Oropharynx as per pre-operative assessment

## 2017-02-06 NOTE — Anesthesia Procedure Notes (Signed)
Central Venous Catheter Insertion Performed by: Arta Brucessey, Jadaya Sommerfield, MD, anesthesiologist Start/End1/21/2019 7:10 AM, 02/06/2017 7:20 AM Patient location: Pre-op. Preanesthetic checklist: patient identified, IV checked, risks and benefits discussed, surgical consent, monitors and equipment checked, pre-op evaluation, timeout performed and anesthesia consent Position: Trendelenburg Lidocaine 1% used for infiltration and patient sedated Hand hygiene performed  and maximum sterile barriers used  Catheter size: 8.5 Fr PA cath was placed.Sheath introducer Swan type:thermodilution Procedure performed using ultrasound guided technique. Ultrasound Notes:anatomy identified, needle tip was noted to be adjacent to the nerve/plexus identified, no ultrasound evidence of intravascular and/or intraneural injection and image(s) printed for medical record Attempts: 1 Following insertion, line sutured and dressing applied. Post procedure assessment: blood return through all ports, free fluid flow and no air  Patient tolerated the procedure well with no immediate complications.

## 2017-02-06 NOTE — Progress Notes (Signed)
  Echocardiogram Echocardiogram Transesophageal has been performed.  Celene SkeenVijay  Gabriana Wilmott 02/06/2017, 9:13 AM

## 2017-02-06 NOTE — Op Note (Signed)
CARDIOVASCULAR SURGERY OPERATIVE NOTE  02/06/2017 Hilton Sinclair [id number]  Surgeon:  Alleen Borne, MD  First Assistant: Benay Spice, RNFA   Preoperative Diagnosis:  Moderte to severe aortic stenosis and insufficiency   Postoperative Diagnosis:  Same   Procedure:  1. Median Sternotomy 2. Extracorporeal circulation 3.   Aortic valve replacement using a 23 mm Edwards Magna-Ease pericardial valve.  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 He has been followed by Dr. Purvis Sheffield and his most recent echocardiogram on 10/26/2016 showed severe aortic stenosis with a mean gradient of 41 mmHg with a peak gradient of 86 mmHg. The aortic valve area was calculated at 1.12 cm. There was moderate aortic insufficiency and moderate mitral regurgitation. He developed brief on and off episodes of chest pain that was mild with no associated symptoms. This resolved and he has had no recurrence. He denies any shortness of breath with exertion or fatigue. He was chopping wood without any difficulty and his energy level has been good. He denies any dizziness or syncope. He has had no peripheral edema. He was seen by Dr. Clifton James and underwent cardiac catheterization on 11/14/2016 showing mild nonobstructive coronary disease in the right coronary artery and LAD territories. There was a chronic total occlusion of a mid left circumflex stent. The mean gradient across aortic valve was measured at 31 mmHg with a peak to peak gradient of 27 mmHg and an aortic valve area of 0.62 cm. His cardiac index was 2.12. Right heart pressures were normal. He was seen by me 11/17/2016 and referred to Dr. Kristin Bruins for dental evaluation and subsequently had extractions  performed on 11/29/2016.   He has has stage C asymptomatic severe aortic stenosis with moderate aortic insufficiency and moderate mitral regurgitation. I reviewed the symptoms of aortic stenosis multiple times with him and he denies any symptoms other than the brief episodes of chest pain that he had one day a few weeks ago that have resolved and not returned. He has been chopping wood without any difficulty. I have personally reviewed and interpreted his recent echocardiogram and cardiac catheterization. His left ventricular systolic function is normal but he does have grade 1 diastolic dysfunction and his left ventricular internal dimension during diastole has increased to 54 mm. His mitral valve has some calcification and thickening of the leaflets. There is fluttering of the anterior leaflet during diastole related to his aortic insufficiency and I wonder if that is interfering with closure of the valve. His mitral regurgitation looks more mild to me. His cardiac catheterization shows nonobstructive coronary disease with a chronically occluded left circumflex stent. He does not require any coronary bypasses. I think the best option for him is to have his valve replaced before he has progressive left ventricular deterioration given his combination of severe aortic stenosis and moderate aortic insufficiency. I do not think he would be a candidate for TAVR since his operative risk is low and he is only 67 years old. I discussed this with him and he seems to understand the reasoning. I discussed open surgical aortic valve replacement with him and my recommendation for using a bioprosthetic valve. I discussed the operative procedure with the patient including alternatives, benefits and risks; including but not limited to bleeding, blood transfusion, infection, stroke, myocardial infarction, graft  failure, heart block requiring a permanent pacemaker, organ dysfunction, and death. Zarek O Simpsonunderstands and  agrees to proceed.     Preparation:  The patient was seen in the preoperative holding area and the correct patient, correct operation were confirmed with the patient after reviewing the medical record and catheterization. The consent was signed by me. Preoperative antibiotics were given. A pulmonary arterial line and radial arterial line were placed by the anesthesia team. The patient was taken back to the operating room and positioned supine on the operating room table. After being placed under general endotracheal anesthesia by the anesthesia team a foley catheter was placed. The neck, chest, abdomen, and both legs were prepped with betadine soap and solution and draped in the usual sterile manner. A surgical time-out was taken and the correct patient and operative procedure were confirmed with the nursing and anesthesia staff.   Pre-bypass TEE:   Complete TEE assessment was performed by Dr. Arta Bruce. This was reviewed by me. 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 There was mild MR. LVEF was normal with mild LV dilation.    Post-bypass TEE:   Normal functioning prosthetic aortic valve with no perivalvular leak or regurgitation through the valve. mean gradient 4 mm Hg across the prosthetic valve. Left ventricular function preserved. Unchanged mild mitral regurgitation.    Cardiopulmonary Bypass:  A median sternotomy was performed. The pericardium was opened in the midline. Right ventricular function appeared normal. The ascending aorta was of normal size and had no palpable plaque. There were no contraindications to aortic cannulation or cross-clamping. The patient was fully systemically heparinized and the ACT was maintained > 400 sec. The proximal aortic arch was cannulated with a 20 F aortic cannula for arterial inflow. Venous cannulation was performed via the right atrial appendage using a two-staged venous cannula. An antegrade  cardioplegia/vent cannula was inserted into the mid-ascending aorta. A left ventricular vent was placed via the right superior pulmonary vein. A retrograde cardioplegia cannnula was placed into the coronary sinus via the right atrium. Aortic occlusion was performed with a single cross-clamp. Systemic cooling to 32 degrees Centigrade and topical cooling of the heart with iced saline were used. Hyperkalemic antegrade cold blood cardioplegia was used to induce diastolic arrest and then cold blood retrograde cardioplegia was given at about 20 minute intervals throughout the period of arrest to maintain myocardial temperature at or below 10 degrees centigrade. A temperature probe was inserted into the interventricular septum and an insulating pad was placed in the pericardium. Carbon dioxide was insufflated into the pericardium at 5L/min throughout the procedure to minimize intracardiac air.   Aortic Valve Replacement:   A transverse aortotomy was performed 1 cm above the take-off of the right coronary artery. The native valve was tricuspid with marked rheumatic thickening of the leaflets and mild annular calcification. The ostia of the coronary arteries were in normal position and were not obstructed. The native valve leaflets were excised and the annulus was decalcified with rongeurs. Care was taken to remove all particulate debris. The left ventricle was directly inspected for debris and then irrigated with ice saline solution. The annulus was sized and a size 23 mm Edwards Magna-Ease pericardial valve was chosen. The model number was [id number] and the serial number was [id number]. While the valve was being prepared 2-0 Ethibond pledgeted horizontal mattress sutures were placed around the annulus with the pledgets in a sub-annular position. The sutures were placed through the sewing ring  and the valve lowered into place. The sutures were tied sequentially. The valve seated nicely and the coronary ostia were not  obstructed. The prosthetic valve leaflets moved normally and there was no sub-valvular obstruction. The aortotomy was closed using 4-0 Prolene suture in 2 layers with felt strips to reinforce the closure.  Completion:  The patient was rewarmed to 37 degrees Centigrade. De-airing maneuvers were performed and the head placed in trendelenburg position. The crossclamp was removed with a time of 94 minutes. There was spontaneous return of sinus rhythm. The aortotomy was checked for hemostasis. Two temporary epicardial pacing wires were placed on the right atrium and two on the right ventricle. The left ventricular vent and retrograde cardioplegia cannulas were removed. The patient was weaned from CPB without difficulty on no inotropes. CPB time was 115 minutes. Cardiac output was 5 LPM. Heparin was fully reversed with protamine and the aortic and venous cannulas removed. Hemostasis was achieved. Mediastinal drainage tubes were placed. The sternum was closed with double #6 stainless steel wires. The fascia was closed with continuous # 1 vicryl suture. The subcutaneous tissue was closed with 2-0 vicryl continuous suture. The skin was closed with 3-0 vicryl subcuticular suture. All sponge, needle, and instrument counts were reported correct at the end of the case. Dry sterile dressings were placed over the incisions and around the chest tubes which were connected to pleurevac suction. The patient was then transported to the surgical intensive care unit in critical but stable condition.

## 2017-02-06 NOTE — Anesthesia Preprocedure Evaluation (Addendum)
Anesthesia Evaluation  Patient identified by MRN, date of birth, ID band Patient awake    Reviewed: Allergy & Precautions, NPO status , Patient's Chart, lab work & pertinent test results  Airway Mallampati: I  TM Distance: >3 FB Neck ROM: Full    Dental  (+) Poor Dentition   Pulmonary former smoker,    Pulmonary exam normal        Cardiovascular + CAD and + Past MI  Normal cardiovascular exam     Neuro/Psych    GI/Hepatic   Endo/Other  diabetes, Type 2, Oral Hypoglycemic Agents  Renal/GU      Musculoskeletal   Abdominal   Peds  Hematology   Anesthesia Other Findings   Reproductive/Obstetrics                            Anesthesia Physical Anesthesia Plan  ASA: III  Anesthesia Plan: General   Post-op Pain Management:    Induction: Intravenous  PONV Risk Score and Plan: 2  Airway Management Planned: Oral ETT  Additional Equipment: Arterial line, CVP, PA Cath, TEE, 3D TEE and Ultrasound Guidance Line Placement  Intra-op Plan:   Post-operative Plan: Post-operative intubation/ventilation  Informed Consent: I have reviewed the patients History and Physical, chart, labs and discussed the procedure including the risks, benefits and alternatives for the proposed anesthesia with the patient or authorized representative who has indicated his/her understanding and acceptance.     Plan Discussed with: CRNA and Surgeon  Anesthesia Plan Comments:         Anesthesia Quick Evaluation

## 2017-02-06 NOTE — Anesthesia Procedure Notes (Signed)
Arterial Line Insertion Start/End1/21/2019 6:50 AM, 02/06/2017 6:55 AM Performed by: Quentin OreWalker, Sabir Charters E, CRNA, CRNA  Patient location: Pre-op. Preanesthetic checklist: patient identified, IV checked, site marked, risks and benefits discussed, surgical consent, monitors and equipment checked, pre-op evaluation and timeout performed Lidocaine 1% used for infiltration Left, radial was placed Catheter size: 20 G Hand hygiene performed , maximum sterile barriers used  and Seldinger technique used Allen's test indicative of satisfactory collateral circulation Attempts: 1 Procedure performed without using ultrasound guided technique. Following insertion, Biopatch and dressing applied. Post procedure assessment: normal  Patient tolerated the procedure well with no immediate complications.

## 2017-02-06 NOTE — Brief Op Note (Signed)
02/06/2017  12:03 PM  PATIENT:  [name]  67 y.o. male  PRE-OPERATIVE DIAGNOSIS:  SEVERE AORTIC STENOSIS  POST-OPERATIVE DIAGNOSIS:  SEVERE AORTIC STENOSIS  PROCEDURE:  Procedure(s): AORTIC VALVE REPLACEMENT (AVR) (N/A) TRANSESOPHAGEAL ECHOCARDIOGRAM (TEE) (N/A)  SURGEON:  Surgeon(s) and Role:    * Bartle, Payton DoughtyBryan K, MD - Primary  PHYSICIAN ASSISTANT: none  ASSISTANTS: Benay SpiceMarie Irwin, RNFA    ANESTHESIA:   general  EBL:      BLOOD ADMINISTERED:none  DRAINS: 2 Chest Tube(s) in the mediastinum   LOCAL MEDICATIONS USED:  NONE  SPECIMEN:  No Specimen  DISPOSITION OF SPECIMEN:  N/A  COUNTS:  YES  TOURNIQUET:  * No tourniquets in log *  DICTATION: .Note written in EPIC  PLAN OF CARE: Admit to inpatient   PATIENT DISPOSITION:  ICU - intubated and hemodynamically stable.   Delay start of Pharmacological VTE agent (>24hrs) due to surgical blood loss or risk of bleeding: yes

## 2017-02-06 NOTE — Interval H&P Note (Signed)
History and Physical Interval Note:  02/06/2017 6:53 AM  [name]  has presented today for surgery, with the diagnosis of SEVERE AORTIC STENOSIS  The various methods of treatment have been discussed with the patient and family. After consideration of risks, benefits and other options for treatment, the patient has consented to  Procedure(s): AORTIC VALVE REPLACEMENT (AVR) (N/A) TRANSESOPHAGEAL ECHOCARDIOGRAM (TEE) (N/A) as a surgical intervention .  The patient's history has been reviewed, patient examined, no change in status, stable for surgery.  I have reviewed the patient's chart and labs.  Questions were answered to the patient's satisfaction.     Alleen BorneBryan K Bartle

## 2017-02-06 NOTE — Anesthesia Postprocedure Evaluation (Signed)
Anesthesia Post Note  Patient: [name]  Procedure(s) Performed: AORTIC VALVE REPLACEMENT (AVR) (N/A Chest) TRANSESOPHAGEAL ECHOCARDIOGRAM (TEE) (N/A )     Patient location during evaluation: SICU Anesthesia Type: General Level of consciousness: sedated Pain management: pain level controlled Vital Signs Assessment: post-procedure vital signs reviewed and stable Respiratory status: patient remains intubated per anesthesia plan Cardiovascular status: stable Postop Assessment: no apparent nausea or vomiting Anesthetic complications: no    Last Vitals:  Vitals:   02/06/17 0600 02/06/17 0614  BP: (!) 208/42 (!) 175/41  Pulse: 65 (!) 59  Resp: 20   Temp: 36.6 C   SpO2: 95%     Last Pain:  Vitals:   02/06/17 1215  TempSrc: Core (Comment)                 Erubiel Manasco DAVID

## 2017-02-06 NOTE — Progress Notes (Signed)
Pt ABG PaO2 below acceptable range for Open Heart Rapid Wean Protocol. VC 1.2, Will leave pt on cpap 5/ ps 10 for another 20 mins (per Protocol) and re-draw ABG and obtain NIF at that time. RN aware

## 2017-02-06 NOTE — Progress Notes (Signed)
Patient ID: Sean SinclairWillie O [name], male   DOB: Nov 18, 1950, 67 y.o.   MRN: [id number]  TCTS Evening Rounds:   Hemodynamically stable  CI = 2.2  Extubated  Urine output good  CT output low  CBC    Component Value Date/Time   WBC 11.7 (H) 02/06/2017 1218   RBC 3.95 (L) 02/06/2017 1218   HGB 11.3 (L) 02/06/2017 1218   HGB 13.6 11/04/2016 1012   HCT 35.0 (L) 02/06/2017 1218   HCT 40.0 11/04/2016 1012   PLT 102 (L) 02/06/2017 1218   PLT 217 11/04/2016 1012   MCV 88.6 02/06/2017 1218   MCV 85 11/04/2016 1012   MCH 28.6 02/06/2017 1218   MCHC 32.3 02/06/2017 1218   RDW 14.4 02/06/2017 1218   RDW 13.7 11/04/2016 1012   LYMPHSABS 2.6 11/04/2016 1012   MONOABS 0.5 07/27/2007 1407   EOSABS 0.1 11/04/2016 1012   BASOSABS 0.0 11/04/2016 1012     BMET    Component Value Date/Time   NA 143 02/06/2017 1217   NA 141 11/04/2016 1012   K 5.0 02/06/2017 1217   CL 103 02/06/2017 1105   CO2 18 (L) 02/02/2017 1122   GLUCOSE 155 (H) 02/06/2017 1217   BUN 14 02/06/2017 1105   BUN 13 11/04/2016 1012   CREATININE 0.70 02/06/2017 1105   CALCIUM 9.2 02/02/2017 1122   GFRNONAA >60 02/02/2017 1122   GFRAA >60 02/02/2017 1122     A/P:  Stable postop course. Continue current plans

## 2017-02-06 NOTE — Procedures (Signed)
Extubation Procedure Note  Patient Details:   Name: [name] DOB: 28-Jan-1950 MRN: [id number]   Airway Documentation:     Evaluation  O2 sats: stable throughout Complications: No apparent complications Patient did tolerate procedure well. Bilateral Breath Sounds: Rhonchi   Yes   Pt extubated per Open Heart Rapid Wean Protocol to 4L, increased to 10L HFNC to maintain spo2 >93%. Pt denies SOB, no increased WOB. Pt able to speak and has a weak, productive cough post extubation. ABG within normal limits (PaO2 60, but ok to extubate per Dr Laneta SimmersBartle), Nif -28, VC 1.1L with good effort. Pt encouraged to use Yankauer to clear secretions and initiated Incentive Spirometer after extubation. RN at bedside. RT will continue to monitor  Per Dr Laneta SimmersBartle (verbal by phone) pt may need PRN Bipap. Order entered.   [name], [name] 02/06/2017, 4:39 PM

## 2017-02-06 NOTE — Transfer of Care (Signed)
Immediate Anesthesia Transfer of Care Note  Patient: [name]  Procedure(s) Performed: AORTIC VALVE REPLACEMENT (AVR) (N/A Chest) TRANSESOPHAGEAL ECHOCARDIOGRAM (TEE) (N/A )  Patient Location: SICU  Anesthesia Type:General  Level of Consciousness: sedated, unresponsive and Patient remains intubated per anesthesia plan  Airway & Oxygen Therapy: Patient remains intubated per anesthesia plan and Patient placed on Ventilator (see vital sign flow sheet for setting)  Post-op Assessment: Report given to RN and Post -op Vital signs reviewed and stable  Post vital signs: Reviewed and stable  Last Vitals:  Vitals:   02/06/17 0600 02/06/17 0614  BP: (!) 208/42 (!) 175/41  Pulse: 65 (!) 59  Resp: 20   Temp: 36.6 C   SpO2: 95%     Last Pain:  Vitals:   02/06/17 0600  TempSrc: Oral      Patients Stated Pain Goal: 3 (02/06/17 [id number])  Complications: No apparent anesthesia complications

## 2017-02-07 ENCOUNTER — Inpatient Hospital Stay (HOSPITAL_COMMUNITY): Payer: Medicare Other

## 2017-02-07 ENCOUNTER — Encounter (HOSPITAL_COMMUNITY): Payer: Self-pay | Admitting: Surgery

## 2017-02-07 LAB — GLUCOSE, CAPILLARY
GLUCOSE-CAPILLARY: 103 mg/dL — AB (ref 65–99)
GLUCOSE-CAPILLARY: 113 mg/dL — AB (ref 65–99)
GLUCOSE-CAPILLARY: 142 mg/dL — AB (ref 65–99)
GLUCOSE-CAPILLARY: 143 mg/dL — AB (ref 65–99)
GLUCOSE-CAPILLARY: 127 mg/dL — AB (ref 65–99)
GLUCOSE-CAPILLARY: 146 mg/dL — AB (ref 65–99)
Glucose-Capillary: 118 mg/dL — ABNORMAL HIGH (ref 65–99)
Glucose-Capillary: 119 mg/dL — ABNORMAL HIGH (ref 65–99)
Glucose-Capillary: 122 mg/dL — ABNORMAL HIGH (ref 65–99)
Glucose-Capillary: 123 mg/dL — ABNORMAL HIGH (ref 65–99)
Glucose-Capillary: 126 mg/dL — ABNORMAL HIGH (ref 65–99)
Glucose-Capillary: 147 mg/dL — ABNORMAL HIGH (ref 65–99)
Glucose-Capillary: 153 mg/dL — ABNORMAL HIGH (ref 65–99)
Glucose-Capillary: 158 mg/dL — ABNORMAL HIGH (ref 65–99)
Glucose-Capillary: 170 mg/dL — ABNORMAL HIGH (ref 65–99)

## 2017-02-07 LAB — CBC
HCT: 33 % — ABNORMAL LOW (ref 39.0–52.0)
HCT: 30.2 % — ABNORMAL LOW (ref 39.0–52.0)
HEMOGLOBIN: 11 g/dL — AB (ref 13.0–17.0)
Hemoglobin: 9.8 g/dL — ABNORMAL LOW (ref 13.0–17.0)
MCH: 29.4 pg (ref 26.0–34.0)
MCH: 28.7 pg (ref 26.0–34.0)
MCHC: 33.3 g/dL (ref 30.0–36.0)
MCHC: 32.5 g/dL (ref 30.0–36.0)
MCV: 88.2 fL (ref 78.0–100.0)
MCV: 88.3 fL (ref 78.0–100.0)
PLATELETS: 121 10*3/uL — AB (ref 150–400)
Platelets: 125 10*3/uL — ABNORMAL LOW (ref 150–400)
RBC: 3.74 MIL/uL — AB (ref 4.22–5.81)
RBC: 3.42 MIL/uL — AB (ref 4.22–5.81)
RDW: 14.5 % (ref 11.5–15.5)
RDW: 14.7 % (ref 11.5–15.5)
WBC: 13.5 10*3/uL — AB (ref 4.0–10.5)
WBC: 15.3 10*3/uL — ABNORMAL HIGH (ref 4.0–10.5)

## 2017-02-07 LAB — BASIC METABOLIC PANEL
ANION GAP: 10 (ref 5–15)
BUN: 8 mg/dL (ref 6–20)
CHLORIDE: 106 mmol/L (ref 101–111)
CO2: 23 mmol/L (ref 22–32)
Calcium: 8.4 mg/dL — ABNORMAL LOW (ref 8.9–10.3)
Creatinine, Ser: 0.87 mg/dL (ref 0.61–1.24)
Glucose, Bld: 105 mg/dL — ABNORMAL HIGH (ref 65–99)
Potassium: 4.1 mmol/L (ref 3.5–5.1)
SODIUM: 139 mmol/L (ref 135–145)

## 2017-02-07 LAB — POCT I-STAT, CHEM 8
BUN: 13 mg/dL (ref 6–20)
CALCIUM ION: 1.19 mmol/L (ref 1.15–1.40)
CREATININE: 1 mg/dL (ref 0.61–1.24)
Chloride: 98 mmol/L — ABNORMAL LOW (ref 101–111)
Glucose, Bld: 188 mg/dL — ABNORMAL HIGH (ref 65–99)
HCT: 31 % — ABNORMAL LOW (ref 39.0–52.0)
HEMOGLOBIN: 10.5 g/dL — AB (ref 13.0–17.0)
Potassium: 4.7 mmol/L (ref 3.5–5.1)
SODIUM: 137 mmol/L (ref 135–145)
TCO2: 25 mmol/L (ref 22–32)

## 2017-02-07 LAB — CREATININE, SERUM
CREATININE: 1.1 mg/dL (ref 0.61–1.24)
GFR calc Af Amer: >60 mL/min (ref >60)

## 2017-02-07 LAB — MAGNESIUM
MAGNESIUM: 2.4 mg/dL (ref 1.7–2.4)
MAGNESIUM: 2.4 mg/dL (ref 1.7–2.4)

## 2017-02-07 LAB — POCT I-STAT 3, ART BLOOD GAS (G3+)
ACID-BASE DEFICIT: 2 mmol/L (ref 0.0–2.0)
BICARBONATE: 22.8 mmol/L (ref 20.0–28.0)
O2 Saturation: 88 %
TCO2: 24 mmol/L (ref 22–32)
pCO2 arterial: 37.3 mmHg (ref 32.0–48.0)
pH, Arterial: 7.397 (ref 7.350–7.450)
pO2, Arterial: 57 mmHg — ABNORMAL LOW (ref 83.0–108.0)

## 2017-02-07 MED ORDER — INSULIN DETEMIR 100 UNIT/ML ~~LOC~~ SOLN: 20.0000 [IU] | Freq: Every day | SUBCUTANEOUS | Status: DC

## 2017-02-07 MED ORDER — ENOXAPARIN SODIUM 40 MG/0.4ML ~~LOC~~ SOLN
40.0000 mg | Freq: Every day | SUBCUTANEOUS | Status: DC
  Administered 2017-02-07 – 2017-02-12 (×6): 40 mg via SUBCUTANEOUS
  Filled 2017-02-07 (×6): qty 0.4

## 2017-02-07 MED ORDER — LISINOPRIL 10 MG PO TABS
10.0000 mg | ORAL_TABLET | Freq: Every day | ORAL | Status: DC
  Administered 2017-02-07 – 2017-02-08 (×2): 10 mg via ORAL
  Filled 2017-02-07 (×2): qty 1

## 2017-02-07 MED ORDER — FUROSEMIDE 10 MG/ML IJ SOLN
40.0000 mg | Freq: Once | INTRAMUSCULAR | Status: AC
  Administered 2017-02-07: 40 mg via INTRAVENOUS
  Filled 2017-02-07: qty 4

## 2017-02-07 MED ORDER — INSULIN DETEMIR 100 UNIT/ML ~~LOC~~ SOLN
20.0000 [IU] | Freq: Every day | SUBCUTANEOUS | Status: DC
  Administered 2017-02-07 – 2017-02-08 (×2): 20 [IU] via SUBCUTANEOUS
  Filled 2017-02-07 (×3): qty 0.2

## 2017-02-07 MED ORDER — FUROSEMIDE 10 MG/ML IJ SOLN
40.0000 mg | Freq: Once | INTRAMUSCULAR | Status: AC
  Administered 2017-02-07: 40 mg via INTRAVENOUS

## 2017-02-07 MED ORDER — INSULIN ASPART 100 UNIT/ML ~~LOC~~ SOLN
0.0000 [IU] | SUBCUTANEOUS | Status: DC
  Administered 2017-02-07: 4 [IU] via SUBCUTANEOUS
  Administered 2017-02-07: 2 [IU] via SUBCUTANEOUS
  Administered 2017-02-07: 4 [IU] via SUBCUTANEOUS
  Administered 2017-02-08 (×2): 2 [IU] via SUBCUTANEOUS

## 2017-02-07 MED FILL — Thrombin For Soln 5000 Unit: CUTANEOUS | Qty: 3 | Status: AC

## 2017-02-07 NOTE — Evaluation (Signed)
Physical Therapy Evaluation Patient Details Name: [name] MRN: [id number] DOB: 1950-04-05 Today's Date: 02/07/2017   History of Present Illness  Pt s/p AVR for severe aortic stenosis on 1/21. PMH - lt AKA, DM, CAD, MI  Clinical Impression  Pt presents to PT with decr mobility s/p AVR. 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 Pt dons prosthesis in standing so the challenge will be for pt to be able to come to standing following sternal precautions to be able to do that. Pt may progress rapidly and be able to go home with sister.    Follow Up Recommendations SNF    Equipment Recommendations  Rolling walker with 5" wheels    Recommendations for Other Services       Precautions / Restrictions Precautions Precautions: Sternal;Fall Restrictions Other Position/Activity Restrictions: sternal precautions      Mobility  Bed Mobility               General bed mobility comments: up in the chair  Transfers Overall transfer level: Needs assistance Equipment used: Ambulation equipment used(EVA walker) Transfers: Sit to/from Stand Sit to Stand: +2 physical assistance;Mod assist         General transfer comment: Assist to bring hips up and for balance. Pt came to stand on RLE and then placed prothetic on  Ambulation/Gait Ambulation/Gait assistance: Min assist;+2 safety/equipment Ambulation Distance (Feet): 100 Feet Assistive device: 4-wheeled walker(Eva walker) Gait Pattern/deviations: Step-through pattern;Decreased step length - right;Decreased step length - left;Decreased stride length Gait velocity: decr  Gait velocity interpretation: Below normal speed for age/gender General Gait Details: Assist for balance and support. Pt with circumduction of lt prosthetic. Pt amb on 15L of O2 with SpO2 93%.  Stairs            Wheelchair Mobility    Modified Rankin (Stroke Patients Only)       Balance Overall balance assessment: Needs assistance Sitting-balance  support: No upper extremity supported;Feet supported Sitting balance-Leahy Scale: Good     Standing balance support: Bilateral upper extremity supported Standing balance-Leahy Scale: Poor Standing balance comment: BUE support and min assist for static standing with or without prosthetic                             Pertinent Vitals/Pain Pain Assessment: Faces Faces Pain Scale: Hurts a little bit Pain Location: incisional Pain Descriptors / Indicators: Guarding Pain Intervention(s): Limited activity within patient's tolerance;Monitored during session    Home Living Family/patient expects to be discharged to:: Private residence Living Arrangements: Alone Available Help at Discharge: Family;Available 24 hours/day(sister) Type of Home: House Home Access: Stairs to enter Entrance Stairs-Rails: None Entrance Stairs-Number of Steps: 3 Home Layout: One level Home Equipment: Crutches Additional Comments: Has AKA knee prosthetic    Prior Function Level of Independence: Independent with assistive device(s)         Comments: with prosthetic     Hand Dominance        Extremity/Trunk Assessment   Upper Extremity Assessment Upper Extremity Assessment: Defer to OT evaluation    Lower Extremity Assessment Lower Extremity Assessment: Generalized weakness;LLE deficits/detail LLE Deficits / Details: AKA       Communication   Communication: No difficulties  Cognition Arousal/Alertness: Awake/alert Behavior During Therapy: WFL for tasks assessed/performed Overall Cognitive Status: Within Functional Limits for tasks assessed  General Comments      Exercises     Assessment/Plan    PT Assessment Patient needs continued PT services  PT Problem List Decreased strength;Decreased activity tolerance;Decreased balance;Decreased mobility;Decreased knowledge of precautions       PT Treatment Interventions DME  instruction;Gait training;Functional mobility training;Therapeutic activities;Therapeutic exercise;Balance training;Patient/family education;Stair training    PT Goals (Current goals can be found in the Care Plan section)  Acute Rehab PT Goals Patient Stated Goal: return home PT Goal Formulation: With patient Time For Goal Achievement: 02/21/17 Potential to Achieve Goals: Good    Frequency Min 3X/week   Barriers to discharge Inaccessible home environment;Decreased caregiver support stairs to enter and sister to stay with pt but has had recent back surgery    Co-evaluation               AM-PAC PT "6 Clicks" Daily Activity  Outcome Measure Difficulty turning over in bed (including adjusting bedclothes, sheets and blankets)?: Unable Difficulty moving from lying on back to sitting on the side of the bed? : Unable Difficulty sitting down on and standing up from a chair with arms (e.g., wheelchair, bedside commode, etc,.)?: Unable Help needed moving to and from a bed to chair (including a wheelchair)?: A Lot Help needed walking in hospital room?: A Little Help needed climbing 3-5 steps with a railing? : Total 6 Click Score: 9    End of Session Equipment Utilized During Treatment: Gait belt;Other (comment)(prosthetic) Activity Tolerance: Patient tolerated treatment well Patient left: in chair;with call bell/phone within reach Nurse Communication: Mobility status PT Visit Diagnosis: Unsteadiness on feet (R26.81);Other abnormalities of gait and mobility (R26.89);Difficulty in walking, not elsewhere classified (R26.2)    Time: 1610-9604 PT Time Calculation (min) (ACUTE ONLY): 40 min   Charges:   PT Evaluation $PT Eval Moderate Complexity: 1 Mod PT Treatments $Gait Training: 23-37 mins   PT G Codes:        [facility] PT [phone]   Angelina Ok [facility] 02/07/2017, 1:33 PM

## 2017-02-07 NOTE — Progress Notes (Signed)
      [address]       [place] [id number]             [phone]      POD # 1 AVR  BP (!) 160/84 (BP Location: Right Arm)   Pulse 79   Temp 97.9 F (36.6 C) (Oral)   Resp 15   Wt 159 lb 13.3 oz (72.5 kg)   SpO2 93%   BMI 23.60 kg/m   Intake/Output Summary (Last 24 hours) at 02/07/2017 1750 Last data filed at 02/07/2017 1700 Gross per 24 hour  Intake 1713.96 ml  Output 3385 ml  Net -1671.04 ml   Down to 6L HFNC from 15L  K= 4.7, creatinine 1.0  Will give another dose of IV lasix  Viviann SpareSteven C. Dorris FetchHendrickson, MD [facility] [phone]

## 2017-02-07 NOTE — Progress Notes (Signed)
1 Day Post-Op Procedure(s) (LRB): AORTIC VALVE REPLACEMENT (AVR) (N/A) TRANSESOPHAGEAL ECHOCARDIOGRAM (TEE) (N/A) Subjective: No complaints  Objective: Vital signs in last 24 hours: Temp:  [96.3 F (35.7 C)-100 F (37.8 C)] 98.4 F (36.9 C) (01/22 0700) Pulse Rate:  [79-90] 79 (01/22 0400) Cardiac Rhythm: Normal sinus rhythm (01/22 0715) Resp:  [12-29] 23 (01/22 0700) BP: (89-139)/(59-91) 126/65 (01/22 0700) SpO2:  [89 %-100 %] 100 % (01/22 0700) Arterial Line BP: (67-191)/(42-99) 148/61 (01/22 0700) FiO2 (%):  [40 %-50 %] 40 % (01/21 1600) Weight:  [72.5 kg (159 lb 13.3 oz)] 72.5 kg (159 lb 13.3 oz) (01/22 0500)  Hemodynamic parameters for last 24 hours: PAP: (35-62)/(15-35) 49/19 CO:  [3.7 L/min-5.5 L/min] 4.9 L/min CI:  [2 L/min/m2-2.9 L/min/m2] 2.6 L/min/m2  Intake/Output from previous day: 01/21 0701 - 01/22 0700 In: 4119.8 [I.V.:2969.8; Blood:250; IV Piggyback:900] Out: 5560 [Urine:4725; Emesis/NG output:5; Blood:400; Chest Tube:430] Intake/Output this shift: No intake/output data recorded.  General appearance: alert and cooperative Neurologic: intact Heart: regular rate and rhythm, S1, S2 normal, no murmur, click, rub or gallop Lungs: clear to auscultation bilaterally Extremities: edema mild Wound: dressing dry  Lab Results: Recent Labs    02/06/17 1809 02/07/17 0249  WBC 13.4* 13.5*  HGB 10.5* 11.0*  HCT 32.5* 33.0*  PLT 131* 125*   BMET:  Recent Labs    02/06/17 1746 02/06/17 1809 02/07/17 0249  NA 141  --  139  K 3.9  --  4.1  CL 107  --  106  CO2  --   --  23  GLUCOSE 146*  --  105*  BUN 10  --  8  CREATININE 0.80 0.92 0.87  CALCIUM  --   --  8.4*    PT/INR:  Recent Labs    02/06/17 1218  LABPROT 16.2*  INR 1.32   ABG    Component Value Date/Time   PHART 7.397 02/07/2017 0033   HCO3 22.8 02/07/2017 0033   TCO2 24 02/07/2017 0033   ACIDBASEDEF 2.0 02/07/2017 0033   O2SAT 88.0 02/07/2017 0033   CBG (last 3)  Recent Labs   02/07/17 0501 02/07/17 0607 02/07/17 0649  GLUCAP 113* 126* 153*   CXR: bibasilar atelectasis  ECG: sinus, no acute changes  Assessment/Plan: S/P Procedure(s) (LRB): AORTIC VALVE REPLACEMENT (AVR) (N/A) TRANSESOPHAGEAL ECHOCARDIOGRAM (TEE) (N/A) Hemodynamically stable in sinus rhythm Mobilize, PT consult Diuresis Diabetes control. Start Levemir and SSI. d/c tubes/lines Continue foley due to diuresing patient, patient in ICU and urinary output monitoring See progression orders   LOS: 1 day    Alleen BorneBryan K Aviv Rota 02/07/2017

## 2017-02-07 NOTE — Care Management Note (Signed)
Case Management Note  Patient Details  Name: [name] MRN: [id number] Date of Birth: November 01, 1950  Subjective/Objective:  From home alone, POD 1 AVR, cont to diuresis. Per pt eval rec SNF.                   Action/Plan: NCM will follow along with CSW for dc needs.   Expected Discharge Date:                  Expected Discharge Plan:     In-House Referral:     Discharge planning Services  CM Consult  Post Acute Care Choice:    Choice offered to:     DME Arranged:    DME Agency:     HH Arranged:    HH Agency:     Status of Service:  In process, will continue to follow  If discussed at Long Length of Stay Meetings, dates discussed:    Additional Comments:  Leone Havenaylor, Nishawn Rotan Clinton, RN 02/07/2017, 4:22 PM

## 2017-02-08 ENCOUNTER — Inpatient Hospital Stay (HOSPITAL_COMMUNITY): Payer: Medicare Other

## 2017-02-08 LAB — GLUCOSE, CAPILLARY
GLUCOSE-CAPILLARY: 131 mg/dL — AB (ref 65–99)
GLUCOSE-CAPILLARY: 140 mg/dL — AB (ref 65–99)
Glucose-Capillary: 119 mg/dL — ABNORMAL HIGH (ref 65–99)
Glucose-Capillary: 156 mg/dL — ABNORMAL HIGH (ref 65–99)
Glucose-Capillary: 187 mg/dL — ABNORMAL HIGH (ref 65–99)
Glucose-Capillary: 156 mg/dL — ABNORMAL HIGH (ref 65–99)

## 2017-02-08 LAB — BASIC METABOLIC PANEL
Anion gap: 11 (ref 5–15)
BUN: 17 mg/dL (ref 6–20)
CALCIUM: 8.8 mg/dL — AB (ref 8.9–10.3)
CHLORIDE: 99 mmol/L — AB (ref 101–111)
CO2: 27 mmol/L (ref 22–32)
Creatinine, Ser: 1.15 mg/dL (ref 0.61–1.24)
GFR calc Af Amer: >60 mL/min (ref >60)
GFR calc non Af Amer: >60 mL/min (ref >60)
GLUCOSE: 121 mg/dL — AB (ref 65–99)
Potassium: 4.2 mmol/L (ref 3.5–5.1)
Sodium: 137 mmol/L (ref 135–145)

## 2017-02-08 LAB — CBC
HCT: 32.9 % — ABNORMAL LOW (ref 39.0–52.0)
Hemoglobin: 10.4 g/dL — ABNORMAL LOW (ref 13.0–17.0)
MCH: 28.3 pg (ref 26.0–34.0)
MCHC: 31.6 g/dL (ref 30.0–36.0)
MCV: 89.4 fL (ref 78.0–100.0)
PLATELETS: 135 10*3/uL — AB (ref 150–400)
RBC: 3.68 MIL/uL — ABNORMAL LOW (ref 4.22–5.81)
RDW: 14.8 % (ref 11.5–15.5)
WBC: 15.6 10*3/uL — ABNORMAL HIGH (ref 4.0–10.5)

## 2017-02-08 MED ORDER — DOCUSATE SODIUM 100 MG PO CAPS
200.0000 mg | ORAL_CAPSULE | Freq: Every day | ORAL | Status: DC
  Administered 2017-02-08: 200 mg via ORAL

## 2017-02-08 MED ORDER — ONDANSETRON HCL 4 MG PO TABS: 4.0000 mg | ORAL_TABLET | Freq: Four times a day (QID) | ORAL | Status: DC | PRN

## 2017-02-08 MED ORDER — INSULIN ASPART 100 UNIT/ML ~~LOC~~ SOLN
0.0000 [IU] | Freq: Three times a day (TID) | SUBCUTANEOUS | Status: DC
  Administered 2017-02-08: 4 [IU] via SUBCUTANEOUS
  Administered 2017-02-08 – 2017-02-10 (×8): 2 [IU] via SUBCUTANEOUS
  Administered 2017-02-10: 4 [IU] via SUBCUTANEOUS
  Administered 2017-02-10 – 2017-02-11 (×2): 2 [IU] via SUBCUTANEOUS
  Administered 2017-02-11 (×2): 8 [IU] via SUBCUTANEOUS
  Administered 2017-02-12: 2 [IU] via SUBCUTANEOUS
  Administered 2017-02-12 (×2): 4 [IU] via SUBCUTANEOUS
  Administered 2017-02-12 – 2017-02-13 (×2): 2 [IU] via SUBCUTANEOUS

## 2017-02-08 MED ORDER — ACETAMINOPHEN 325 MG PO TABS
650.0000 mg | ORAL_TABLET | Freq: Four times a day (QID) | ORAL | Status: DC | PRN
  Administered 2017-02-09: 650 mg via ORAL
  Filled 2017-02-08: qty 2

## 2017-02-08 MED ORDER — METOPROLOL TARTRATE 25 MG PO TABS: 25.0000 mg | ORAL_TABLET | Freq: Two times a day (BID) | ORAL | Status: DC

## 2017-02-08 MED ORDER — SODIUM CHLORIDE 0.9 % IV SOLN: 250.0000 mL | INTRAVENOUS | Status: DC | PRN

## 2017-02-08 MED ORDER — METOPROLOL TARTRATE 25 MG PO TABS
25.0000 mg | ORAL_TABLET | Freq: Two times a day (BID) | ORAL | Status: DC
  Administered 2017-02-08 – 2017-02-09 (×3): 25 mg via ORAL
  Filled 2017-02-08 (×2): qty 1

## 2017-02-08 MED ORDER — ASPIRIN EC 325 MG PO TBEC
325.0000 mg | DELAYED_RELEASE_TABLET | Freq: Every day | ORAL | Status: DC
  Administered 2017-02-08 – 2017-02-13 (×6): 325 mg via ORAL
  Filled 2017-02-08 (×5): qty 1

## 2017-02-08 MED ORDER — SODIUM CHLORIDE 0.9% FLUSH: 3.0000 mL | INTRAVENOUS | Status: DC | PRN

## 2017-02-08 MED ORDER — METOPROLOL TARTRATE 25 MG/10 ML ORAL SUSPENSION: 25.0000 mg | Freq: Two times a day (BID) | ORAL | Status: DC

## 2017-02-08 MED ORDER — SODIUM CHLORIDE 0.9% FLUSH
3.0000 mL | Freq: Two times a day (BID) | INTRAVENOUS | Status: DC
  Administered 2017-02-08 – 2017-02-13 (×10): 3 mL via INTRAVENOUS

## 2017-02-08 MED ORDER — FUROSEMIDE 40 MG PO TABS
40.0000 mg | ORAL_TABLET | Freq: Every day | ORAL | Status: DC
  Administered 2017-02-08 – 2017-02-09 (×2): 40 mg via ORAL
  Filled 2017-02-08 (×2): qty 1

## 2017-02-08 MED ORDER — PANTOPRAZOLE SODIUM 40 MG PO TBEC
40.0000 mg | DELAYED_RELEASE_TABLET | Freq: Every day | ORAL | Status: DC
  Administered 2017-02-08 – 2017-02-13 (×6): 40 mg via ORAL
  Filled 2017-02-08 (×5): qty 1

## 2017-02-08 MED ORDER — ONDANSETRON HCL 4 MG/2ML IJ SOLN: 4.0000 mg | Freq: Four times a day (QID) | INTRAMUSCULAR | Status: DC | PRN

## 2017-02-08 MED ORDER — POTASSIUM CHLORIDE CRYS ER 20 MEQ PO TBCR
20.0000 meq | EXTENDED_RELEASE_TABLET | Freq: Two times a day (BID) | ORAL | Status: DC
  Administered 2017-02-08 – 2017-02-09 (×3): 20 meq via ORAL
  Filled 2017-02-08 (×3): qty 1

## 2017-02-08 MED ORDER — MOVING RIGHT ALONG BOOK
Freq: Once | Status: AC
  Administered 2017-02-08: 10:00:00
  Filled 2017-02-08: qty 1

## 2017-02-08 MED ORDER — TRAMADOL HCL 50 MG PO TABS: 50.0000 mg | ORAL_TABLET | ORAL | Status: DC | PRN

## 2017-02-08 MED ORDER — OXYCODONE HCL 5 MG PO TABS
5.0000 mg | ORAL_TABLET | ORAL | Status: DC | PRN
  Administered 2017-02-09 – 2017-02-10 (×2): 5 mg via ORAL
  Filled 2017-02-08 (×2): qty 1

## 2017-02-08 MED FILL — Magnesium Sulfate Inj 50%: INTRAMUSCULAR | Qty: 10 | Status: AC

## 2017-02-08 MED FILL — Potassium Chloride Inj 2 mEq/ML: INTRAVENOUS | Qty: 40 | Status: AC

## 2017-02-08 MED FILL — Sodium Chloride IV Soln 0.9%: INTRAVENOUS | Qty: 2000 | Status: AC

## 2017-02-08 MED FILL — Sodium Bicarbonate IV Soln 8.4%: INTRAVENOUS | Qty: 50 | Status: AC

## 2017-02-08 MED FILL — Mannitol IV Soln 20%: INTRAVENOUS | Qty: 500 | Status: AC

## 2017-02-08 MED FILL — Lidocaine HCl IV Inj 20 MG/ML: INTRAVENOUS | Qty: 5 | Status: AC

## 2017-02-08 MED FILL — Heparin Sodium (Porcine) Inj 1000 Unit/ML: INTRAMUSCULAR | Qty: 30 | Status: AC

## 2017-02-08 MED FILL — Electrolyte-R (PH 7.4) Solution: INTRAVENOUS | Qty: 3000 | Status: AC

## 2017-02-08 MED FILL — Heparin Sodium (Porcine) Inj 1000 Unit/ML: INTRAMUSCULAR | Qty: 2500 | Status: AC

## 2017-02-08 NOTE — Progress Notes (Signed)
CARDIAC REHAB PHASE I   PRE:  Rate/Rhythm: 87 SR  BP:  Supine: 128/84  Sitting:   Standing:    SaO2: 95% 7L  MODE:  Ambulation: 80 ft   POST:  Rate/Rhythm: 114 ST  BP:  Supine:   Sitting: 181/83  Standing:    SaO2: 87-89 % 8L 1330-1409 Assisted pt with putting on prosthesis. Pt walked 80 ft on 8 L with rolling walker and gait belt use and asst x 2.  At times had a little difficulty picking up prosthesis, seemed to drag a little. Tired quickly. Desat to 87-89% on  8L. Encouraged standing rest breaks. To bed after walk.   Luetta Nuttingharlene Soliana Kitko, RN BSN  02/08/2017 2:04 PM

## 2017-02-08 NOTE — Discharge Summary (Signed)
Physician Discharge Summary       [address] [place]       [place] [address]             [phone]    Patient ID: [name] MRN: [id number] DOB/AGE: 1950/12/11 67 y.o.  Admit date: 02/06/2017 Discharge date: 02/13/2017  Admission Diagnoses: Moderate to severe aortic stenosis and insufficiency  Active Diagnoses:  1. ABL anemia 2. Mild thrombocytopenia  Additional Past Medcial Diagnoses: 1. Coronary artery disease 2. Diabetes mellitus 3. Hyperlipidemia 4. MI (myocardial infarction) (HCC) 5. S/P AKA (above knee amputation) (HCC) 6. Former tobacco abuse   Procedure (s):  1. Median Sternotomy 2. Extracorporeal circulation 3.   Aortic valve replacement using a 23 mm Edwards Magna-Ease pericardial valve by Dr. Laneta Simmers on 02/06/2017.  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 He has been followed by Dr. Purvis Sheffield and his most recent echocardiogram on 10/26/2016 showed severe aortic stenosis with a mean gradient of 41 mmHg with a peak gradient of 86 mmHg. The aortic valve area was calculated at 1.12 cm. There was moderate aortic insufficiency and moderate mitral regurgitation. He developed brief on and off episodes of chest pain that was mild with no associated symptoms. This resolved and he has had no recurrence. He denies any shortness of breath with exertion or fatigue. He was chopping wood without any difficulty and his energy level has been good. He denies any dizziness or syncope. He has had no peripheral edema. He was seen by Dr. Clifton James and underwent cardiac catheterization on 11/14/2016 showing mild nonobstructive coronary disease in the right coronary artery and LAD territories. There was a chronic total occlusion of a mid left circumflex stent.  The mean gradient across aortic valve was measured at 31 mmHg with a peak to peak gradient of 27 mmHg and an aortic valve area of 0.62 cm. His cardiac index was 2.12. Right heart pressures were normal.   He was seen by me 11/17/2016 and referred to Dr. Kristin Bruins for dental evaluation and subsequently had extractions performed on 11/29/2016. He did well with that and wanted to wait until the third week of January to have his surgery.  This 67 year old gentleman has stage C asymptomatic severe aortic stenosis with moderate aortic insufficiency and moderate mitral regurgitation. I reviewed the symptoms of aortic stenosis multiple times with him and he denies any symptoms other than the brief episodes of chest pain that he had one day a few weeks ago that have resolved and not returned. He has been chopping wood without any difficulty. I have personally reviewed and interpreted his recent echocardiogram and cardiac catheterization. His left ventricular systolic function is normal but he does have grade 1 diastolic dysfunction and his left ventricular internal dimension during diastole has increased to 54 mm. His mitral valve has some calcification and thickening of the leaflets. There is fluttering of the anterior leaflet during diastole related to his aortic insufficiency and I wonder if that is interfering with closure of the valve. His mitral regurgitation looks more mild to me. His cardiac catheterization shows nonobstructive coronary disease.   Dr. Laneta Simmers did not think he would be a candidate for TAVR since his operative risk is low and he is only 67 years old. Dr. Laneta Simmers discussed this with him and he seems to understand  the reasoning. He discussed open surgical aortic valve replacement with him and my recommendation for using a bioprosthetic valve. Dr. Laneta Simmers discussed the operative procedure with the patient including alternatives, benefits and risks. Patient agreed to proceed with surgery. Pre operative  carotid duplex showed no significant internal carotid artery stenosis bilaterally. He underwent an aortic valve replacement on 02/06/2017.  Brief Hospital Course:  The patient was extubated the evening of surgery without difficulty. He remained afebrile and hemodynamically stable. Theone Murdoch, a line, chest tubes, and foley were removed early in the post operative course. Lopressor was started and titrated accordingly. He was volume over loaded and diuresed. Hehad ABL anemia.Last H and H was 10.4 and  32.9. He was weaned off the insulin drip.  Once he was tolerating a diet, Metformin was restarted.  The patient's glucose remained well controlled.The patient's HGA1C pre op was 7.9. He will need follow up with his medical doctor after discharge. The patient was felt surgically stable for transfer from the ICU to PCTU for further convalescence on 02/09/2016. He continues to progress with cardiac rehab. He was still requiring 5-6 liters of oxygen post op. We were able to wean him to 1 liter of oxygen via Bliss (at night only). He has been tolerating a diet and has had a bowel movement. Epicardial pacing wires were removed on 02/09/2017. We ask the SNF to please remove the chest tube sutures on 02/16/2017. It was felt the patient would benefit from SNF at discharge. The patient is felt surgically stable for discharge today.  We ask the SNF to please do the following: 1. Please obtain vital signs at least one time daily 2.Please weigh the patient daily. If he or she continues to gain weight or develops lower extremity edema, contact the office at [phone]. 3. Ambulate patient at least three times daily and please use sternal precautions.  Latest Vital Signs: Blood pressure 119/76, pulse 93, temperature 97.8 F (36.6 C), temperature source Oral, resp. rate 18, height 5\' 9"  (1.753 m), weight 143 lb 11.8 oz (65.2 kg), SpO2 90 %.  Physical Exam: Cardiovascular: RRR Pulmonary: Mostly clear to auscultation  bilaterally Abdomen: Soft, non tender, bowel sounds present.   Extremity: Right LE with no edema Wounds: Sternal dressing is clean clean and dry.  No erythema or signs of infection.    Discharge Condition: Stable and discharged to SNF.  Recent laboratory studies:  Lab Results  Component Value Date   WBC 15.6 (H) 02/08/2017   HGB 10.4 (L) 02/08/2017   HCT 32.9 (L) 02/08/2017   MCV 89.4 02/08/2017   PLT 135 (L) 02/08/2017   Lab Results  Component Value Date   NA 137 02/08/2017   K 4.2 02/08/2017   CL 99 (L) 02/08/2017   CO2 27 02/08/2017   CREATININE 1.15 02/08/2017   GLUCOSE 121 (H) 02/08/2017    Diagnostic Studies: Dg Chest 2 View  Result Date: 02/09/2017 CLINICAL DATA:  Follow-up pleural effusion. History of coronary artery disease, previous MI, aortic stenosis with aortic valve replacement. Former smoker. EXAM: CHEST  2 VIEW COMPARISON:  Portable chest x-ray of February 08, 2017 FINDINGS: There is a small right pleural effusion. There is a trace left pleural effusion. The cardiac silhouette is enlarged. The interstitial markings are mildly prominent. 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 The sternal wires are intact. There calcification in the wall of the aortic arch. The bony thorax exhibits no acute abnormality. IMPRESSION: Persistent small right pleural effusion. Trace left pleural effusion.  CHF with mild interstitial edema slightly improved since yesterday's study. Electronically Signed   By: David  [place] M.D.   On: 02/09/2017 12:33    Discharge Medications: Allergies as of 02/13/2017   No Known Allergies     Medication List    STOP taking these medications   aspirin 81 MG chewable tablet Replaced by:  aspirin 325 MG EC tablet   naproxen 500 MG tablet Commonly known as:  NAPROSYN   naproxen sodium 220 MG tablet Commonly known as:  ALEVE   nitroGLYCERIN 0.4 MG SL tablet Commonly known as:  NITROSTAT     TAKE these medications   acetaminophen 325 MG  tablet Commonly known as:  TYLENOL Take 2 tablets (650 mg total) by mouth every 6 (six) hours as needed for mild pain.   aspirin 325 MG EC tablet Take 1 tablet (325 mg total) by mouth daily. Replaces:  aspirin 81 MG chewable tablet   chlorhexidine 0.12 % solution Commonly known as:  PERIDEX Rinse with 15 mls twice daily for 30 seconds. Use after breakfast and at bedtime. Spit out excess. Do not swallow.   lisinopril 10 MG tablet Commonly known as:  PRINIVIL,ZESTRIL Take 1 tablet (10 mg total) by mouth daily. What changed:    medication strength  how much to take   metFORMIN 1000 MG tablet Commonly known as:  GLUCOPHAGE Take 1,000 mg by mouth 2 (two) times daily with a meal.   metoprolol tartrate 50 MG tablet Commonly known as:  LOPRESSOR Take 1 tablet (50 mg total) by mouth 2 (two) times daily. What changed:    medication strength  how much to take   simvastatin 40 MG tablet Commonly known as:  ZOCOR Take 40 mg by mouth at bedtime.   tamsulosin 0.4 MG Caps capsule Commonly known as:  FLOMAX Take 0.4 mg by mouth daily as needed (frequent urination).   traMADol 50 MG tablet Commonly known as:  ULTRAM Take 50 mg by mouth every 4-6 hours PRN moderate to severe pain.            Durable Medical Equipment  (From admission, onward)        Start     Ordered   02/10/17 1251  For home use only DME Walker rolling  Once    Question:  Patient needs a walker to treat with the following condition  Answer:  Physical deconditioning   02/10/17 1250     The patient has been discharged on:   1.Beta Blocker:  Yes [  x ]                              No   [   ]                              If No, reason:  2.Ace Inhibitor/ARB: Yes [ x  ]                                     No  [    ]                                     If No, reason:  3.Statin:   Yes [  x  ]                  No  [   ]                  If No, reason:  4.Ecasa:  Yes  [  x ]                  No   [    ]                  If No, reason:  Follow Up Appointments:  Contact information for follow-up providers    Alleen BorneBartle, Bryan K, MD. Go on 03/15/2017.   Specialty:  Cardiothoracic Surgery Why:  [facility] which is in the same building as Dr. Sharee PimpleBartle's office) on 03/15/2017 at 10:00 am; Appointment time is at 10:30 am Contact information: [address] [place] [address] [phone]        Dyann KiefLenze, Michele M, PA-C. Go on 02/27/2017.   Specialty:  Cardiology Why:  Appointment time is at 8:30 am Contact information: [address] [place] [address] [phone]            Contact information for after-discharge care    Destination    [facility].   Service:  Skilled Nursing Contact information: [address] [place] [id number] [phone]                  Signed: Lelon HuhDonielle M [facility] Psych[facility] CenterZimmermanPA-C 02/13/2017, 7:33 AM

## 2017-02-08 NOTE — Clinical Social Work Note (Signed)
Clinical Social Work Assessment  Patient Details  Name: [name] MRN: [id number] Date of Birth: [birth year]  Date of referral:  02/08/17               Reason for consult:  Facility Placement                Permission sought to share information with:  Facility Industrial/product designerContact Representative Permission granted to share information::  Yes, Verbal Permission Granted  Name::        Agency::  SNF  Relationship::     Contact Information:     Housing/Transportation Living arrangements for the past 2 months:  Single Family Home Source of Information:  Patient Patient Interpreter Needed:  None Criminal Activity/Legal Involvement Pertinent to Current Situation/Hospitalization:  No - Comment as needed Significant Relationships:  Adult Children Lives with:  Self Do you feel safe going back to the place where you live?  No Need for family participation in patient care:  No (Coment)  Care giving concerns:  Pt lives at home alone- does not have enough support to return home given current impairment   Office managerocial Worker assessment / plan:  CSW spoke with pt concerning PT recommendation for SNF.  Explained SNF and SNF referral process.  Employment status:  Retired Database administratornsurance information:  Managed Medicare PT Recommendations:  Skilled Nursing Facility Information / Referral to community resources:  Skilled Nursing Facility  Patient/Family's Response to care:  Pt has never been to SNF before but is agreeable to short term SNF stay when discharged from the hospital.  Patient/Family's Understanding of and Emotional Response to Diagnosis, Current Treatment, and Prognosis:  Pt has good understanding of current condition and is realistic about current needs- hopeful he will recover quickly and be able to return home.  Emotional Assessment Appearance:  Appears stated age Attitude/Demeanor/Rapport:    Affect (typically observed):  Appropriate Orientation:  Oriented to Self, Oriented to Place, Oriented to   Time, Oriented to Situation Alcohol / Substance use:  Not Applicable Psych involvement (Current and /or in the community):  No (Comment)  Discharge Needs  Concerns to be addressed:  Care Coordination Readmission within the last 30 days:  No Current discharge risk:  Physical Impairment Barriers to Discharge:  Continued Medical Work up   Burna SisUris, Haroldine Redler H, LCSW 02/08/2017, 1:31 PM

## 2017-02-08 NOTE — NC FL2 (Signed)
[place] MEDICAID FL2 LEVEL OF CARE SCREENING TOOL     IDENTIFICATION  Patient Name: DELMOS VELAQUEZ Birthdate: 1950-05-23 Sex: male Admission Date (Current Location): 02/06/2017  [facility] and [place] Number:  Recruitment consultant and Address:  [facility] [place], [address] [place], [place] [address]      Provider Number: [id number]  Attending Physician Name and Address:  Alleen Borne, MD  Relative Name and Phone Number:       Current Level of Care: Hospital Recommended Level of Care: Skilled Nursing Facility Prior Approval Number:    Date Approved/Denied:   PASRR Number: [id number]  Discharge Plan: SNF    Current Diagnoses: Patient Active Problem List   Diagnosis Date Noted  . 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 Severe aortic stenosis   . Aortic stenosis 04/29/2013  . Aortic regurgitation 04/29/2013  . Murmur, cardiac 11/22/2010  . DYSLIPIDEMIA 10/24/2008  . Coronary atherosclerosis 10/24/2008  . CAROTID BRUIT 10/24/2008  . DM 10/21/2008    Orientation RESPIRATION BLADDER Height & Weight     Self, Time, Situation, Place  O2(see DC summary) Incontinent, Indwelling catheter Weight: 167 lb 11.2 oz (76.1 kg) Height:     BEHAVIORAL SYMPTOMS/MOOD NEUROLOGICAL BOWEL NUTRITION STATUS      Continent Diet(cardiac; carb modified)  AMBULATORY STATUS COMMUNICATION OF NEEDS Skin   Extensive Assist Verbally Surgical wounds(chest wound with gauze dressing)                       Personal Care Assistance Level of Assistance  Bathing, Dressing Bathing Assistance: Maximum assistance   Dressing Assistance: Maximum assistance     Functional Limitations Info             SPECIAL CARE FACTORS FREQUENCY  PT (By licensed PT), OT (By licensed OT)     PT Frequency: 5/wk OT Frequency: 5/wk            Contractures      Additional Factors Info  Code Status, Allergies, Insulin Sliding Scale Code Status Info:  FULL Allergies Info: NKA   Insulin Sliding Scale Info: 4/day       Current Medications (02/08/2017):  This is the current hospital active medication list Current Facility-Administered Medications  Medication Dose Route Frequency Provider Last Rate Last Dose  . 0.9 %  sodium chloride infusion  250 mL Intravenous PRN Alleen Borne, MD      . acetaminophen (TYLENOL) tablet 650 mg  650 mg Oral Q6H PRN Alleen Borne, MD      . aspirin EC tablet 325 mg  325 mg Oral Daily Alleen Borne, MD   325 mg at 02/08/17 1024  . docusate sodium (COLACE) capsule 200 mg  200 mg Oral Daily Alleen Borne, MD   200 mg at 02/08/17 1024  . enoxaparin (LOVENOX) injection 40 mg  40 mg Subcutaneous QHS Alleen Borne, MD   40 mg at 02/07/17 2120  . furosemide (LASIX) tablet 40 mg  40 mg Oral Daily Alleen Borne, MD   40 mg at 02/08/17 1047  . insulin aspart (novoLOG) injection 0-24 Units  0-24 Units Subcutaneous TID AC & HS Bartle, Payton Doughty, MD      . insulin detemir (LEVEMIR) injection 20 Units  20 Units Subcutaneous Daily Alleen Borne, MD   20 Units at 02/08/17 1047  . lisinopril (PRINIVIL,ZESTRIL) tablet 10 mg  10 mg Oral Daily Bartle,  Payton DoughtyBryan K, MD   10 mg at 02/08/17 1024  . metoprolol tartrate (LOPRESSOR) tablet 25 mg  25 mg Oral BID Alleen BorneBartle, Bryan K, MD   25 mg at 02/08/17 1023  . ondansetron (ZOFRAN) tablet 4 mg  4 mg Oral Q6H PRN Alleen BorneBartle, Bryan K, MD       Or  . ondansetron (ZOFRAN) injection 4 mg  4 mg Intravenous Q6H PRN Alleen BorneBartle, Bryan K, MD      . oxyCODONE (Oxy IR/ROXICODONE) immediate release tablet 5-10 mg  5-10 mg Oral Q3H PRN Alleen BorneBartle, Bryan K, MD      . pantoprazole (PROTONIX) EC tablet 40 mg  40 mg Oral QAC breakfast Alleen BorneBartle, Bryan K, MD   40 mg at 02/08/17 1024  . potassium chloride SA (K-DUR,KLOR-CON) CR tablet 20 mEq  20 mEq Oral BID Alleen BorneBartle, Bryan K, MD   20 mEq at 02/08/17 1047  . simvastatin (ZOCOR) tablet 40 mg  40 mg Oral QHS Alleen BorneBartle, Bryan K, MD   40 mg at 02/07/17 2120  . sodium  chloride flush (NS) 0.9 % injection 3 mL  3 mL Intravenous Q12H Bartle, Bryan K, MD      . sodium chloride flush (NS) 0.9 % injection 3 mL  3 mL Intravenous PRN Alleen BorneBartle, Bryan K, MD      . tamsulosin (FLOMAX) capsule 0.4 mg  0.4 mg Oral Daily PRN Alleen BorneBartle, Bryan K, MD      . traMADol Janean Sark(ULTRAM) tablet 50 mg  50 mg Oral Q4H PRN Alleen BorneBartle, Bryan K, MD         Discharge Medications: Please see discharge summary for a list of discharge medications.  Relevant Imaging Results:  Relevant Lab Results:   Additional Information SS#: [id number]  Burna SisUris, Yadier Bramhall H, LCSW

## 2017-02-08 NOTE — Progress Notes (Signed)
CSW following patient for dishcrage need. [facility] but facility will not have a bed available for patient until Friday. CSW continue to follow patient.   Marrianne MoodAshley Palestine Mosco, MSW,  [employer]WA [phone]

## 2017-02-08 NOTE — Evaluation (Signed)
Occupational Therapy Evaluation Patient Details Name: [name] MRN: [id number] DOB: July 10, 1950 Today's Date: 02/08/2017    History of Present Illness Pt is a 67 y.o. male now s/p AVR for severe aortic stenosis on 02/06/17. PMH includes L AKA, DM, CAD, MI.   Clinical Impression   Pt is typically independent. Presents with generalized weakness, edematous hands and impaired balance. Pt requires min to total assist for ADL. Will need post acute rehab in SNF prior to return home. Will follow.    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) Restrictions Other Position/Activity Restrictions: sternal precautions      Mobility Bed Mobility               General bed mobility comments: Received sitting in chair  Transfers Overall transfer level: Needs assistance Equipment used: 2 person hand held assist;Rolling walker (2 wheeled) Transfers: Sit to/from Stand Sit to Stand: Min assist;+2 physical assistance Stand pivot transfers: +2 physical assistance;Min assist       General transfer comment: 2 person assist to transfer to [facility] without RW    Balance Overall balance assessment: Needs assistance Sitting-balance support: No upper extremity supported;Feet supported Sitting balance-Leahy Scale: Fair Sitting balance - Comments: Initially required assist to lean forward without pulling with BUEs; with increased trials, able to do so indep   Standing balance support: Bilateral upper extremity supported Standing balance-Leahy Scale: Poor Standing balance comment: Reliant on BUE support                            ADL either performed or assessed with clinical judgement   ADL Overall ADL's : Needs assistance/impaired Eating/Feeding: Minimal  assistance;Sitting   Grooming: Wash/dry hands;Sitting;Set up   Upper Body Bathing: Minimal assistance;Sitting   Lower Body Bathing: Maximal assistance;Sit to/from stand   Upper Body Dressing : Minimal assistance;Sitting   Lower Body Dressing: Moderate assistance;+2 for physical assistance;Sit to/from stand Lower Body Dressing Details (indicate cue type and reason): for prosthesis Toilet Transfer: Minimal assistance;+2 for physical assistance;Stand-pivot;BSC   Toileting- Clothing Manipulation and Hygiene: Total assistance;+2 for physical assistance;Sit to/from stand       Functional mobility during ADLs: Min guard;Rolling walker;+2 for safety/equipment       Vision Patient Visual Report: No change from baseline       Perception     Praxis      Pertinent Vitals/Pain Pain Assessment: 0-10 Pain Score: 7  Pain Location: Sternal incision Pain Descriptors / Indicators: Sore;Guarding Pain Intervention(s): Repositioned;Monitored during session     Hand Dominance Right   Extremity/Trunk Assessment Upper Extremity Assessment Upper Extremity Assessment: Generalized weakness(edematous)   Lower Extremity Assessment Lower Extremity Assessment: Defer to PT evaluation LLE Deficits / Details: AKA   Cervical / Trunk Assessment Cervical / Trunk Assessment: (weakness)   Communication Communication Communication: No difficulties   Cognition Arousal/Alertness: Awake/alert Behavior During Therapy: Flat affect Overall Cognitive Status: Within Functional Limits for tasks assessed                                     General Comments       Exercises     Shoulder Instructions  Home Living Family/patient expects to be discharged to:: Private residence Living Arrangements: Alone Available Help at Discharge: Friend(s);Available 24 hours/day Type of Home: House Home Access: Stairs to enter [employer] CorporationEntrance Stairs-Number of Steps: 3 Entrance Stairs-Rails: None Home  Layout: One level               Home Equipment: Crutches   Additional Comments: Has AKA knee prosthetic      Prior Functioning/Environment Level of Independence: Independent with assistive device(s)        Comments: with prosthetic        OT Problem List: Decreased strength;Decreased activity tolerance;Impaired balance (sitting and/or standing);Decreased knowledge of use of DME or AE;Decreased safety awareness;Pain;Decreased knowledge of precautions      OT Treatment/Interventions: Self-care/ADL training;DME and/or AE instruction;Therapeutic activities;Balance training;Patient/family education    OT Goals(Current goals can be found in the care plan section) Acute Rehab OT Goals Patient Stated Goal: return home OT Goal Formulation: With patient Time For Goal Achievement: 02/22/17 Potential to Achieve Goals: Good ADL Goals Pt Will Perform Grooming: with supervision;standing Pt Will Perform Lower Body Bathing: with min assist;sit to/from stand Pt Will Perform Lower Body Dressing: with min assist;sit to/from stand Pt Will Transfer to Toilet: with supervision;ambulating;bedside commode Pt Will Perform Toileting - Clothing Manipulation and hygiene: with supervision;sit to/from stand Additional ADL Goal #1: Pt will adhere to sternal precautions independently. Additional ADL Goal #2: Pt will state at least 3 energy conservation strategies as instructed.  OT Frequency: Min 2X/week   Barriers to D/C: Decreased caregiver support          Co-evaluation       OT goals addressed during session: ADL's and self-care      AM-PAC PT "6 Clicks" Daily Activity     Outcome Measure Help from another person eating meals?: A Little Help from another person taking care of personal grooming?: A Little Help from another person toileting, which includes using toliet, bedpan, or urinal?: Total Help from another person bathing (including washing, rinsing, drying)?: A Lot Help from  another person to put on and taking off regular upper body clothing?: A Little Help from another person to put on and taking off regular lower body clothing?: A Lot 6 Click Score: 14   End of Session Equipment Utilized During Treatment: Rolling walker;Gait belt;Oxygen Nurse Communication: Mobility status  Activity Tolerance: Patient tolerated treatment well Patient left: in chair;with call bell/phone within reach  OT Visit Diagnosis: Unsteadiness on feet (R26.81);Other abnormalities of gait and mobility (R26.89);Pain;Muscle weakness (generalized) (M62.81)                Time: 8295-62130901-0920 OT Time Calculation (min): 19 min Charges:  OT General Charges $OT Visit: 1 Visit OT Evaluation $OT Eval Moderate Complexity: 1 Mod G-Codes:     02/08/2017 Martie RoundJulie Elenie Coven, OTR/L Pager: [phone] [place], [place] BailiffJulie Lynn 02/08/2017, 10:36 AM

## 2017-02-08 NOTE — Discharge Instructions (Signed)
Aortic Valve Replacement, Care After °Refer to this sheet in the next few weeks. These instructions provide you with information about caring for yourself after your procedure. Your health care provider may also give you more specific instructions. Your treatment has been planned according to current medical practices, but problems sometimes occur. Call your health care provider if you have any problems or questions after your procedure. °What can I expect after the procedure? °After the procedure, it is common to have: °· Pain around your incision area. °· A small amount of blood or clear fluid coming from your incision. ° °Follow these instructions at home: °Eating and drinking ° °· Follow instructions from your health care provider about eating or drinking restrictions. °? Limit alcohol intake to no more than 1 drink per day for nonpregnant women and 2 drinks per day for men. One drink equals 12 oz of beer, 5 oz of wine, or 1½ oz of hard liquor. °? Limit how much caffeine you drink. Caffeine can affect your heart's rate and rhythm. °· Drink enough fluid to keep your urine clear or pale yellow. °· Eat a heart-healthy diet. This should include plenty of fresh fruits and vegetables. If you eat meat, it should be lean cuts. Avoid foods that are: °? High in salt, saturated fat, or sugar. °? Canned or highly processed. °? Fried. °Activity °· Return to your normal activities as told by your health care provider. Ask your health care provider what activities are safe for you. °· Exercise regularly once you have recovered, as told by your health care provider. °· Avoid sitting for more than 2 hours at a time without moving. Get up and move around at least once every 1-2 hours. This helps to prevent blood clots in the legs. °· Do not lift anything that is heavier than 10 lb (4.5 kg) until your health care provider approves. °· Avoid pushing or pulling things with your arms until your health care provider approves. This  includes pulling on handrails to help you climb stairs. °Incision care ° °· Follow instructions from your health care provider about how to take care of your incision. Make sure you: °? Wash your hands with soap and water before you change your bandage (dressing). If soap and water are not available, use hand sanitizer. °? Change your dressing as told by your health care provider. °? Leave stitches (sutures), skin glue, or adhesive strips in place. These skin closures may need to stay in place for 2 weeks or longer. If adhesive strip edges start to loosen and curl up, you may trim the loose edges. Do not remove adhesive strips completely unless your health care provider tells you to do that. °· Check your incision area every day for signs of infection. Check for: °? More redness, swelling, or pain. °? More fluid or blood. °? Warmth. °? Pus or a bad smell. °Medicines °· Take over-the-counter and prescription medicines only as told by your health care provider. °· If you were prescribed an antibiotic medicine, take it as told by your health care provider. Do not stop taking the antibiotic even if you start to feel better. °Travel °· Avoid airplane travel for as long as told by your health care provider. °· When you travel, bring a list of your medicines and a record of your medical history with you. Carry your medicines with you. °Driving °· Ask your health care provider when it is safe for you to drive. Do not drive until your health   care provider approves. °· Do not drive or operate heavy machinery while taking prescription pain medicine. °Lifestyle ° °· Do not use any tobacco products, such as cigarettes, chewing tobacco, or e-cigarettes. If you need help quitting, ask your health care provider. °· Resume sexual activity as told by your health care provider. Do not use medicines for erectile dysfunction unless your health care provider approves, if this applies. °· Work with your health care provider to keep your  blood pressure and cholesterol under control, and to manage any other heart conditions that you have. °· Maintain a healthy weight. °General instructions °· Do not take baths, swim, or use a hot tub until your health care provider approves. °· Do not strain to have a bowel movement. °· Avoid crossing your legs while sitting down. °· Check your temperature every day for a fever. A fever may be a sign of infection. °· If you are a woman and you plan to become pregnant, talk with your health care provider before you become pregnant. °· Wear compression stockings if your health care provider instructs you to do this. These stockings help to prevent blood clots and reduce swelling in your legs. °· Tell all health care providers who care for you that you have an artificial (prosthetic) aortic valve. If you have or have had heart disease or endocarditis, tell all health care providers about these conditions as well. °· Keep all follow-up visits as told by your health care provider. This is important. °Contact a health care provider if: °· You develop a skin rash. °· You experience sudden, unexplained changes in your weight. °· You have more redness, swelling, or pain around your incision. °· You have more fluid or blood coming from your incision. °· Your incision feels warm to the touch. °· You have pus or a bad smell coming from your incision. °· You have a fever. °Get help right away if: °· You develop chest pain that is different from the pain coming from your incision. °· You develop shortness of breath or difficulty breathing. °· You start to feel light-headed. °These symptoms may represent a serious problem that is an emergency. Do not wait to see if the symptoms will go away. Get medical help right away. Call your local emergency services (911 in the [place] Do not drive yourself to the hospital. °This information is not intended to replace advice given to you by your health care provider. Make sure you discuss any  questions you have with your health care provider. °Document Released: 07/22/2004 Document Revised: 06/11/2015 Document Reviewed: 12/07/2014 °Elsevier Interactive Patient Education © 2017 [employer] Inc. ° °

## 2017-02-08 NOTE — Progress Notes (Signed)
Physical Therapy Treatment Patient Details Name: [name] MRN: [id number] DOB: 1950-03-20 Today's Date: 02/08/2017    History of Present Illness Pt is a 67 y.o. male now s/p AVR for severe aortic stenosis on 02/06/17. PMH includes L AKA, DM, CAD, MI.   PT Comments    Pt progressing with mobility. Continues to require minA+2 to stand and assist donning of L AKA prosthetic. MinA (+2 safety/lines) to ambulate with RW. Intermittent cues for technique to maintain sternal precautions. Recommend SNF-level therapies at d/c to maximize functional mobility and independence prior to return home; pt in agreement with this. Will follow acutely.   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) Restrictions Other Position/Activity Restrictions: sternal precautions    Mobility  Bed Mobility               General bed mobility comments: Received sitting in chair  Transfers Overall transfer level: Needs assistance Equipment used: 2 person hand held assist;Rolling walker (2 wheeled) Transfers: Sit to/from Stand Sit to Stand: Min assist;+2 physical assistance         General transfer comment: MinA to stand from chair with RW (+2 to assist prosthetic donning); educ and cues to maintain sternal precautions  Ambulation/Gait Ambulation/Gait assistance: Min assist;+2 safety/equipment Ambulation Distance (Feet): 150 Feet Assistive device: Rolling walker (2 wheeled) Gait Pattern/deviations: Step-through pattern;Decreased stride length;Trunk flexed Gait velocity: Decreased Gait velocity interpretation: <1.8 ft/sec, indicative of risk for recurrent falls General Gait Details: Slow, steady amb with RW and use of LLE prosthetic. Intermittent cues to step into RW and maintain upright  posture. SpO2 >88% on 8L O2 [place]. HR up to 123   Stairs            Wheelchair Mobility    Modified Rankin (Stroke Patients Only)       Balance Overall balance assessment: Needs assistance Sitting-balance support: No upper extremity supported;Feet supported Sitting balance-Leahy Scale: Fair Sitting balance - Comments: Initially required assist to lean forward without pulling with BUEs; with increased trials, able to do so indep   Standing balance support: Bilateral upper extremity supported Standing balance-Leahy Scale: Poor Standing balance comment: Reliant on BUE support                             Cognition Arousal/Alertness: Awake/alert Behavior During Therapy: Flat affect Overall Cognitive Status: Within Functional Limits for tasks assessed                                        Exercises      General Comments        Pertinent Vitals/Pain Pain Assessment: 0-10 Pain Score: 7  Pain Location: Sternal incision Pain Descriptors / Indicators: Sore;Guarding Pain Intervention(s): Monitored during session;Limited activity within patient's tolerance    Home Living                      Prior Function            PT Goals (current goals can now be found in the care plan section) Acute Rehab PT Goals Patient Stated Goal: return home PT Goal Formulation: With patient Time  For Goal Achievement: 02/21/17 Potential to Achieve Goals: Good Progress towards PT goals: Progressing toward goals    Frequency    Min 2X/week      PT Plan Frequency needs to be updated    Co-evaluation              AM-PAC PT "6 Clicks" Daily Activity  Outcome Measure  Difficulty turning over in bed (including adjusting bedclothes, sheets and blankets)?: Unable Difficulty moving from lying on back to sitting on the side of the bed? : Unable Difficulty sitting down on and standing up from a chair with arms (e.g., wheelchair, bedside commode,  etc,.)?: Unable Help needed moving to and from a bed to chair (including a wheelchair)?: A Little Help needed walking in hospital room?: A Little Help needed climbing 3-5 steps with a railing? : A Lot 6 Click Score: 11    End of Session Equipment Utilized During Treatment: Gait belt;Other (comment) Activity Tolerance: Patient tolerated treatment well Patient left: in chair;with call bell/phone within reach Nurse Communication: Mobility status PT Visit Diagnosis: Unsteadiness on feet (R26.81);Other abnormalities of gait and mobility (R26.89);Difficulty in walking, not elsewhere classified (R26.2)     Time: 1610-9604 PT Time Calculation (min) (ACUTE ONLY): 23 min  Charges:  $Gait Training: 8-22 mins $Therapeutic Activity: 8-22 mins                    G Codes:      Ina Homes, PT, DPT Acute Rehab Services  Pager: [phone]  Malachy Chamber 02/08/2017, 9:16 AM

## 2017-02-09 ENCOUNTER — Inpatient Hospital Stay (HOSPITAL_COMMUNITY): Payer: Medicare Other

## 2017-02-09 ENCOUNTER — Other Ambulatory Visit: Payer: Self-pay

## 2017-02-09 LAB — GLUCOSE, CAPILLARY
GLUCOSE-CAPILLARY: 140 mg/dL — AB (ref 65–99)
GLUCOSE-CAPILLARY: 147 mg/dL — AB (ref 65–99)
Glucose-Capillary: 144 mg/dL — ABNORMAL HIGH (ref 65–99)
Glucose-Capillary: 151 mg/dL — ABNORMAL HIGH (ref 65–99)

## 2017-02-09 MED ORDER — LISINOPRIL 10 MG PO TABS
10.0000 mg | ORAL_TABLET | Freq: Every day | ORAL | Status: DC
  Administered 2017-02-09 – 2017-02-13 (×5): 10 mg via ORAL
  Filled 2017-02-09 (×5): qty 1

## 2017-02-09 MED ORDER — METFORMIN HCL 850 MG PO TABS
850.0000 mg | ORAL_TABLET | Freq: Two times a day (BID) | ORAL | Status: DC
  Administered 2017-02-09 – 2017-02-10 (×4): 850 mg via ORAL
  Filled 2017-02-09 (×6): qty 1

## 2017-02-09 MED ORDER — METOPROLOL TARTRATE 5 MG/5ML IV SOLN
2.5000 mg | Freq: Once | INTRAVENOUS | Status: AC
  Administered 2017-02-09: 2.5 mg via INTRAVENOUS
  Filled 2017-02-09: qty 5

## 2017-02-09 MED ORDER — METOPROLOL TARTRATE 50 MG PO TABS
50.0000 mg | ORAL_TABLET | Freq: Two times a day (BID) | ORAL | Status: DC
  Administered 2017-02-09 – 2017-02-13 (×8): 50 mg via ORAL
  Filled 2017-02-09 (×8): qty 1

## 2017-02-09 NOTE — Clinical Social Work Note (Signed)
CSW [facility]. She has started the patient's authorization request. She stated he needs to be below 5L oxygen if possible. CSW notified that he is currently on 3L.  Sean CourtSarah Eugune Sine, CSW [phone]

## 2017-02-09 NOTE — Progress Notes (Signed)
EPW pulled per PA order. Pt tolerated activity well. Wire ends  In tact. VSS stable before EPW pulled. Pt instructed that he must stay in bed for 1 hour and that VS will be taken q for 1 hour after EPW pulled. Bed rest complete at 1413. Will continue to monitor.

## 2017-02-09 NOTE — Progress Notes (Addendum)
      [address]       [place] [id number]             [phone]        3 Days Post-Op Procedure(s) (LRB): AORTIC VALVE REPLACEMENT (AVR) (N/A) TRANSESOPHAGEAL ECHOCARDIOGRAM (TEE) (N/A)  Subjective: Patient sleeping-awakened. He has no specific complaints.  Objective: Vital signs in last 24 hours: Temp:  [97.8 F (36.6 C)-99 F (37.2 C)] 97.9 F (36.6 C) (01/24 0523) Pulse Rate:  [79-113] 90 (01/24 0523) Cardiac Rhythm: Sinus tachycardia (01/23 1900) Resp:  [14-25] 23 (01/24 0523) BP: (126-152)/(73-91) 146/83 (01/24 0523) SpO2:  [94 %-97 %] 95 % (01/24 0523) Weight:  [154 lb 12.2 oz (70.2 kg)-155 lb 13.8 oz (70.7 kg)] 154 lb 12.2 oz (70.2 kg) (01/24 0523)  Pre op weight 72.5 kg Current Weight  02/09/17 154 lb 12.2 oz (70.2 kg)      Intake/Output from previous day: 01/23 0701 - 01/24 0700 In: 530 [P.O.:480; IV Piggyback:50] Out: 375 [Urine:375]   Physical Exam:  Cardiovascular: RRR Pulmonary: Slightly diminished at bases Abdomen: Soft, non tender, bowel sounds present.   Extremity: Right LE with trace edema. Wounds: Dressing was removed and wound is clean and dry.  No erythema or signs of infection.  Lab Results: CBC: Recent Labs    02/07/17 1621 02/08/17 0430  WBC 15.3* 15.6*  HGB 9.8* 10.4*  HCT 30.2* 32.9*  PLT 121* 135*   BMET:  Recent Labs    02/07/17 0249 02/07/17 1600 02/07/17 1621 02/08/17 0430  NA 139 137  --  137  K 4.1 4.7  --  4.2  CL 106 98*  --  99*  CO2 23  --   --  27  GLUCOSE 105* 188*  --  121*  BUN 8 13  --  17  CREATININE 0.87 1.00 1.10 1.15  CALCIUM 8.4*  --   --  8.8*    PT/INR:  Lab Results  Component Value Date   INR 1.32 02/06/2017   INR 0.98 02/02/2017   INR 1.0 11/04/2016   ABG:  INR: Will add last result for INR, ABG once components are confirmed Will add last 4 CBG results once components are confirmed  Assessment/Plan:  1. CV - SR in the. On Lopressor 25 mg bid and Lisinopril  10 mg daily. Monitor BP as may need to increase Lisinopril.  2.  Pulmonary - On 6 liters of oxygen via [place]. Wean to room air as able. Will check PA/LAT CXR. Encourage incentive spirometer. 3. Volume Overload - On Lasix 40 mg daily. 4.  Acute blood loss anemia - H and H yesterday stable at 10.4 and 32.9. 5. DM-CBGs 131/156/140. On Insulin. Will restart Metformin at reduced dose. Pre op HGA1C 7.9. Will need close medical follow up after discharge. 6. Remove EPW 7. Will need SNF at discharge and needs less oxygen requirements prior to discharge.  Donielle M ZimmermanPA-C 02/09/2017,7:14 AM   Chart reviewed, patient examined, agree with above. BP still a little high. He was started on Lisinopril in the ICU but it was not continued on transfer so I will restart it at 10 mg daily. HR in the 90's so will increase Lopressor to 50 bid.  Still has higher oxygen requirement at 6L but his diffusion capacity on preop PFT's was only 37% of predicted so I would accept sats > 90 at this time. He may require oxygen at discharge for a while.

## 2017-02-09 NOTE — Progress Notes (Signed)
CARDIAC REHAB PHASE I   PRE:  Rate/Rhythm: 112 ST  BP:  Supine:   Sitting: 153/97  Standing:    SaO2: 93% 3L  MODE:  Ambulation: pivoted to chair ft      POST:  Rate/Rhythm: 138  ST      92% 3L 1443-1511 Came to see pt to walk. Pt c/o feeling hot. Had me turn heat down and put cool clothes to back and forehead. With sitting pt to side of bed his HR went to 138 ST. We helped pt pivot to recliner and kept on 3L.  Did not feel comfortable trying to walk pt with HR increasing with little activity. Notified pt's RN of elevated HR as yesterday when we walked 80 ft HR to 114 ST. Pt stated he felt hot but no other complaints at this time. Slightly SOB in chair but sats good.     Luetta Nuttingharlene Atiyana Welte, RN BSN  02/09/2017 3:07 PM

## 2017-02-09 NOTE — Progress Notes (Signed)
PA on call notified for HR in 120s. BP stable. 2.5mg  Lopressor given per PA order.  EKG completed showing SR HR 99. VSS. Pt now resting in bed. Will continue to monitor.

## 2017-02-09 NOTE — Consult Note (Signed)
           [facility] Primary [facility] Navigator  02/09/2017  [name] 08/28/1950 [id number]   Seen patient at the bedside to identify possible discharge needs.  Patient reports "needing to have heart valve replacement" (has severe aortic stenosis) thatled to this admission/ surgery.  Patient endorses Dr. Carylon Perchesoy Fagan with [facility] Fagan MD office astheprimary care provider.   [facility] [place] [facility].  Patient has been managinghisownmedicationsat homestraight out of the containers.   According to patient, his cousin Pattricia Boss(Annie) or friend Jonny Ruiz(John) have been providing transportation to hisdoctors'appointments.  Patient reports living at home alone and has been independently taking care of self prior to admission. His friend- Jonny RuizJohn will be able to provide assistance with his needs after discharge as stated.  Anticipated discharge [facility]prior to return home per therapy recommendation.   Patient voiced understanding to callprimary care provider's officewhen he returns backhometo schedulea post discharge follow-upwithin1-2 weeksor sooner if needed. Patient letter (with PCP's contact number) was provided as areminder.  Explained to patientregardingTHN CM services available for health management at home and he expressed interest for it. Patient voiced understandingfor needto seek referral from primary care provider to [facility] care management asdeemed necessary and appropriatefor services in the future- once discharged back home.  [facility] care management information provided for future needs that he may have.   For questions, please contact:  Wyatt HasteLorraine Chaddrick Brue, BSN, RN- [facility] Primary Care Navigator  Telephone: [phone] [facility]

## 2017-02-10 LAB — GLUCOSE, CAPILLARY
GLUCOSE-CAPILLARY: 154 mg/dL — AB (ref 65–99)
Glucose-Capillary: 188 mg/dL — ABNORMAL HIGH (ref 65–99)
Glucose-Capillary: 147 mg/dL — ABNORMAL HIGH (ref 65–99)
Glucose-Capillary: 130 mg/dL — ABNORMAL HIGH (ref 65–99)

## 2017-02-10 NOTE — Clinical Social Work Note (Signed)
Per MD note pt will d/c in the next couple of days. CSW updated SNF. CSW will continue to follow for SNF placement at d/c.    GrahamBridget Grisela Mesch, [place]CSWA [phone]

## 2017-02-10 NOTE — Progress Notes (Signed)
Physical Therapy Treatment Patient Details Name: [name] MRN: [id number] DOB: 1950/08/13 Today's Date: 02/10/2017    History of Present Illness Pt is a 67 y.o. male now s/p AVR for severe aortic stenosis on 02/06/17. PMH includes L AKA, DM, CAD, MI.    PT Comments    Pt making steady progress. Continue to recommend ST-SNF for further rehab before return home.   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 Verbal cues for sternal precautions especially to not pull on bed rail to roll.  Transfers Overall transfer level: Needs assistance Equipment used: Rolling walker (2 wheeled) Transfers: Sit to/from Stand Sit to Stand: Min assist;+2 physical assistance         General transfer comment: Assist to bring hips up. 2nd person to assist with holding prosthesis so pt can don in standing  Ambulation/Gait Ambulation/Gait assistance: Min assist;+2 safety/equipment Ambulation Distance (Feet): 200 Feet Assistive device: Rolling walker (2 wheeled) Gait Pattern/deviations: Step-through pattern;Decreased stride length(circumduction on right) Gait velocity: decr Gait velocity interpretation: Below normal speed for age/gender General Gait Details: Assist for balance and support. Pt caught lt prosthetic foot on ground during swing through causing slight stumble. Prior to surgery pt doesn't use walker and he may be used to using wider  circumduction to clear lt foot but now with walker has to have narrower base. Amb on 2L with SpO2 >92%   Stairs            Wheelchair Mobility    Modified Rankin (Stroke Patients Only)       Balance Overall balance assessment: Needs assistance Sitting-balance support: No upper extremity supported;Feet supported Sitting balance-Leahy Scale: Good     Standing balance support: Single extremity supported Standing balance-Leahy Scale: Poor Standing balance comment: UE support                            Cognition Arousal/Alertness: Awake/alert Behavior During Therapy: Flat affect Overall Cognitive Status: Within Functional Limits for tasks assessed                                        Exercises      General Comments        Pertinent Vitals/Pain Pain Assessment: No/denies pain    Home Living                      Prior Function            PT Goals (current goals can now be found in the care plan section) Progress towards PT goals: Progressing toward goals    Frequency    Min 2X/week      PT Plan Current plan remains appropriate    Co-evaluation              AM-PAC PT "6 Clicks" Daily Activity  Outcome Measure  Difficulty turning over in bed (including adjusting bedclothes, sheets and blankets)?: Unable Difficulty moving from lying on back to sitting on the side of the bed? : Unable Difficulty sitting down on and standing up from a chair with arms (e.g., wheelchair, bedside commode, etc,.)?: Unable Help needed moving to and from a bed to chair (including a wheelchair)?: A Little Help needed walking in hospital room?: A Little Help needed climbing 3-5 steps with a railing? : A Lot 6 Click Score: 11    End of Session Equipment Utilized During Treatment: Gait belt;Oxygen Activity Tolerance: Patient tolerated treatment well Patient left: Other (comment);with call bell/phone within reach(on bsc with nursing  student aware) Nurse Communication: Mobility status PT Visit Diagnosis: Unsteadiness on feet (R26.81);Other abnormalities of gait and mobility (R26.89);Difficulty in walking, not elsewhere classified (R26.2)     Time: [id number] PT Time Calculation (min) (ACUTE ONLY): 22 min  Charges:  $Gait Training: 8-22 mins                    G Codes:       [facility] Surgery CenterCary Taavi Hoose PT [phone]    Angelina OkCary W [facility] HospitalMaycok 02/10/2017, 11:36 AM

## 2017-02-10 NOTE — Progress Notes (Signed)
CARDIAC REHAB PHASE I   PRE:  Rate/Rhythm: 95 SR  BP:  Supine:   Sitting: 127/85  Standing:    SaO2: 96% 2L  MODE:  Ambulation: 200 ft   POST:  Rate/Rhythm: 112 ST  BP:  Supine:   Sitting: 149/95  Standing:    SaO2: would not register on either hand or ear   Hands cool 1327-1400 Assisted pt with prosthesis and he was able to walk 200 ft on 2L with gait belt use, rolling walker and asst x 2. Has tendency to not clear with prosthesis and dragged shoe a few times but when he concentrates he does better. This walk was better than two days ago as he dragged shoe many times then. [facility] and [facility] and to bed after walk. Encouraged IS. Heart rate more controlled today. Could not get sats to register so kept on 2L.    Luetta Nuttingharlene Ashelyn Mccravy, RN BSN  02/10/2017 1:54 PM

## 2017-02-10 NOTE — Care Management Important Message (Signed)
Important Message  Patient Details  Name: [name] MRN: [id number] Date of Birth: 03/05/1950   Medicare Important Message Given:  Yes    Lawerance Sabalebbie Geniva Lohnes, RN 02/10/2017, 10:59 AM

## 2017-02-10 NOTE — Progress Notes (Addendum)
      [address]       [place] [id number]             [phone]        4 Days Post-Op Procedure(s) (LRB): AORTIC VALVE REPLACEMENT (AVR) (N/A) TRANSESOPHAGEAL ECHOCARDIOGRAM (TEE) (N/A)  Subjective: Patient states he has diarrhea.  Objective: Vital signs in last 24 hours: Temp:  [97.6 F (36.4 C)-97.7 F (36.5 C)] 97.7 F (36.5 C) (01/24 1949) Pulse Rate:  [82-108] 82 (01/25 0433) Cardiac Rhythm: Sinus tachycardia (01/24 1900) Resp:  [20-30] 24 (01/25 0433) BP: (125-156)/(80-95) 126/80 (01/25 0433) SpO2:  [91 %-96 %] 96 % (01/25 0433) Weight:  [148 lb 9.4 oz (67.4 kg)] 148 lb 9.4 oz (67.4 kg) (01/25 0433)  Pre op weight 72.5 kg Current Weight  02/10/17 148 lb 9.4 oz (67.4 kg)      Intake/Output from previous day: 01/24 0701 - 01/25 0700 In: -  Out: 325 [Urine:325]   Physical Exam:  Cardiovascular: RRR Pulmonary: Slightly diminished at bases Abdomen: Soft, non tender, bowel sounds present.   Extremity: Right LE with no edema Wounds: Sternal dressing is clean clean and dry.  No erythema or signs of infection.  Lab Results: CBC: Recent Labs    02/07/17 1621 02/08/17 0430  WBC 15.3* 15.6*  HGB 9.8* 10.4*  HCT 30.2* 32.9*  PLT 121* 135*   BMET:  Recent Labs    02/07/17 1600 02/07/17 1621 02/08/17 0430  NA 137  --  137  K 4.7  --  4.2  CL 98*  --  99*  CO2  --   --  27  GLUCOSE 188*  --  121*  BUN 13  --  17  CREATININE 1.00 1.10 1.15  CALCIUM  --   --  8.8*    PT/INR:  Lab Results  Component Value Date   INR 1.32 02/06/2017   INR 0.98 02/02/2017   INR 1.0 11/04/2016   ABG:  INR: Will add last result for INR, ABG once components are confirmed Will add last 4 CBG results once components are confirmed  Assessment/Plan:  1. CV - ST at times yesterday. Now on Lopressor 50 mg bid and Lisinopril 10 mg daily.  2.  Pulmonary - On 3 liters of oxygen via [place]. Wean as able. Diffusion capacity was decreased on PFTs.   Encourage incentive spirometer. 3. Volume Overload - On Lasix 40 mg daily. 4.  Acute blood loss anemia - H and H yesterday stable at 10.4 and 32.9. 5. DM-CBGs 144/151/154. On Insulin. Will restart Metformin at reduced dose. Pre op HGA1C 7.9. Will need close medical follow up after discharge. 6. Stop stool softeners 7. Will need SNF at discharge-will discuss with Dr. Laneta SimmersBartle if oxygen requirements need to be at least 2 liters prior to being discharged.  Sean M ZimmermanPA-C 02/10/2017,7:14 AM    Chart reviewed, patient examined, agree with above. Oxygen down to 3L. I think he should be able to go to SNF in a couple days. He is only 4 days postop and with AKA and reduced lung function he is going to be a little slower recovering.

## 2017-02-11 LAB — GLUCOSE, CAPILLARY
GLUCOSE-CAPILLARY: 207 mg/dL — AB (ref 65–99)
GLUCOSE-CAPILLARY: 108 mg/dL — AB (ref 65–99)
Glucose-Capillary: 134 mg/dL — ABNORMAL HIGH (ref 65–99)
Glucose-Capillary: 213 mg/dL — ABNORMAL HIGH (ref 65–99)

## 2017-02-11 MED ORDER — METFORMIN HCL 500 MG PO TABS
500.0000 mg | ORAL_TABLET | Freq: Two times a day (BID) | ORAL | Status: DC
  Administered 2017-02-11 – 2017-02-13 (×4): 500 mg via ORAL
  Filled 2017-02-11 (×4): qty 1

## 2017-02-11 NOTE — Progress Notes (Addendum)
      [address]       [place] [id number]             [phone]      5 Days Post-Op Procedure(s) (LRB): AORTIC VALVE REPLACEMENT (AVR) (N/A) TRANSESOPHAGEAL ECHOCARDIOGRAM (TEE) (N/A) Subjective: Feels tired this morning because he was up all night with diarrhea. He states that it is from the metformin. He had the same side effect before surgery.   Objective: Vital signs in last 24 hours: Temp:  [97.3 F (36.3 C)-98.1 F (36.7 C)] 97.6 F (36.4 C) (01/26 0740) Pulse Rate:  [88-113] 113 (01/26 0740) Cardiac Rhythm: Normal sinus rhythm (01/25 1900) Resp:  [17-20] 17 (01/26 0740) BP: (114-170)/(80-100) 134/83 (01/26 0740) SpO2:  [95 %-97 %] 95 % (01/26 0740) Weight:  [146 lb 4.8 oz (66.4 kg)] 146 lb 4.8 oz (66.4 kg) (01/26 0440)     Intake/Output from previous day: 01/25 0701 - 01/26 0700 In: 345 [P.O.:342; I.V.:3] Out: 240 [Urine:240] Intake/Output this shift: No intake/output data recorded.  General appearance: alert, cooperative and no distress Heart: sinus tachycardia Lungs: clear to auscultation bilaterally Abdomen: soft, tender Extremities: extremities normal, atraumatic, no cyanosis or edema Wound: clean and dry  Lab Results: No results for input(s): WBC, HGB, HCT, PLT in the last 72 hours. BMET: No results for input(s): NA, K, CL, CO2, GLUCOSE, BUN, CREATININE, CALCIUM in the last 72 hours.  PT/INR: No results for input(s): LABPROT, INR in the last 72 hours. ABG    Component Value Date/Time   PHART 7.397 02/07/2017 0033   HCO3 22.8 02/07/2017 0033   TCO2 25 02/07/2017 1600   ACIDBASEDEF 2.0 02/07/2017 0033   O2SAT 88.0 02/07/2017 0033   CBG (last 3)  Recent Labs    02/10/17 1626 02/10/17 2124 02/11/17 0637  GLUCAP 147* 130* 134*    Assessment/Plan: S/P Procedure(s) (LRB): AORTIC VALVE REPLACEMENT (AVR) (N/A) TRANSESOPHAGEAL ECHOCARDIOGRAM (TEE) (N/A)  1. CV-sinus tach at times. BP well controlled. Tolerating Lopressor  50mg  BID and Lisinopril 10mg  daily.  2. Pulm-on 2L [place] with good oxygenation. Continue to wean as tolerated. Encourage use of incentive spirometer.  3. Renal-weight continues to trend down. Creatinine stable. Continue Lasix 40mg  daily.  4. Expected acute blood loss anemia. H and H has been stable 5. Blood glucose level well controlled on current regimen. Metformin restarted. However, he is having multiple bouts of diarrhea. Will try to lower dose to 500mg  BID and see how he tolerates.  6. Deconditioning-will require SNF at discharge. Likely ready in a couple of days.    LOS: 5 days    Sharlene Doryessa N Conte 02/11/2017  patient examined and medical record reviewed,agree with above note. Kathlee Nationseter Van Trigt III 02/11/2017

## 2017-02-11 NOTE — Progress Notes (Signed)
CARDIAC REHAB PHASE I   PRE:  Rate/Rhythm: 99 sinus rhythm  BP:  Supine:   Sitting: 112/60  Standing:    SaO2: 97% 2L   MODE:  Ambulation:  24440ft   POST:  Rate/Rhythem: 101 sinus tachycardia  BP:     SaO2: 98% 2L   Pt ambulated in hallway x1 assist using rolling walker. Pt took 2 standing rest breaks for dyspnea.  Pt used BSC prior to returning to bed. Call light in reach.  NA made aware pt on BSC.  Dr. Morton PetersVan Tright encouraged pt to decrease oxygen use.  RN made aware.    [phone]    [name]  02/11/17 3:28 PM

## 2017-02-12 ENCOUNTER — Other Ambulatory Visit: Payer: Self-pay

## 2017-02-12 LAB — GLUCOSE, CAPILLARY
GLUCOSE-CAPILLARY: 135 mg/dL — AB (ref 65–99)
GLUCOSE-CAPILLARY: 196 mg/dL — AB (ref 65–99)
GLUCOSE-CAPILLARY: 146 mg/dL — AB (ref 65–99)
Glucose-Capillary: 169 mg/dL — ABNORMAL HIGH (ref 65–99)

## 2017-02-12 NOTE — Progress Notes (Addendum)
      [address]       [place] [id number]             [phone]      6 Days Post-Op Procedure(s) (LRB): AORTIC VALVE REPLACEMENT (AVR) (N/A) TRANSESOPHAGEAL ECHOCARDIOGRAM (TEE) (N/A) Subjective: Diarrhea was much better last night on the lower dose of metformin.   Objective: Vital signs in last 24 hours: Temp:  [97.2 F (36.2 C)-98.4 F (36.9 C)] 97.2 F (36.2 C) (01/27 0426) Pulse Rate:  [87-114] 92 (01/27 0426) Cardiac Rhythm: Normal sinus rhythm (01/26 1900) Resp:  [18-27] 22 (01/27 0426) BP: (102-138)/(74-85) 106/80 (01/27 0426) SpO2:  [95 %-98 %] 95 % (01/27 0426) Weight:  [143 lb 11.8 oz (65.2 kg)] 143 lb 11.8 oz (65.2 kg) (01/27 0426)     Intake/Output from previous day: 01/26 0701 - 01/27 0700 In: 150 [P.O.:150] Out: 350 [Urine:350] Intake/Output this shift: No intake/output data recorded.  General appearance: alert, cooperative and no distress Heart: regular rate and rhythm, S1, S2 normal, no murmur, click, rub or gallop Lungs: clear to auscultation bilaterally Abdomen: soft, non-tender; bowel sounds normal; no masses,  no organomegaly Extremities: extremities normal, atraumatic, no cyanosis or edema Wound: clean and dry  Lab Results: No results for input(s): WBC, HGB, HCT, PLT in the last 72 hours. BMET: No results for input(s): NA, K, CL, CO2, GLUCOSE, BUN, CREATININE, CALCIUM in the last 72 hours.  PT/INR: No results for input(s): LABPROT, INR in the last 72 hours. ABG    Component Value Date/Time   PHART 7.397 02/07/2017 0033   HCO3 22.8 02/07/2017 0033   TCO2 25 02/07/2017 1600   ACIDBASEDEF 2.0 02/07/2017 0033   O2SAT 88.0 02/07/2017 0033   CBG (last 3)  Recent Labs    02/11/17 1614 02/11/17 2155 02/12/17 0616  GLUCAP 213* 108* 135*    Assessment/Plan: S/P Procedure(s) (LRB): AORTIC VALVE REPLACEMENT (AVR) (N/A) TRANSESOPHAGEAL ECHOCARDIOGRAM (TEE) (N/A)  1. CV-sinus tach at times, NSR in the 90s this morning.  BP well controlled. Tolerating Lopressor 50mg  BID and Lisinopril 10mg  daily.  2. Pulm-on 2L [place] with good oxygenation. Continue to wean as tolerated. Encourage use of incentive spirometer.  3. Renal-weight continues to trend down. Creatinine stable. Continue Lasix 40mg  daily.  4. Expected acute blood loss anemia. H and H has been stable 5. Blood glucose level with ok control on current regimen. Metformin restarted. He is tolerating the lower dose much better. Will likely require another agent on discharge. Continue SSI for mealtime coverage.   6. Deconditioning-will require SNF at discharge. Likely ready early this week. Walked 27040ft yesterday on oxygen.      LOS: 6 days    Sharlene Doryessa N Conte 02/12/2017 patient examined and medical record reviewed,agree with above note. Kathlee Nationseter Van Trigt III 02/12/2017

## 2017-02-12 NOTE — Plan of Care (Signed)
  Progressing Education: Knowledge of General Education information will improve 02/12/2017 2329 - Progressing by Renelda MomWorley, Amadou Katzenstein L, RN Health Behavior/Discharge Planning: Ability to manage health-related needs will improve 02/12/2017 2329 - Progressing by Renelda MomWorley, Codie Krogh L, RN Clinical Measurements: Ability to maintain clinical measurements within normal limits will improve 02/12/2017 2329 - Progressing by Renelda MomWorley, Aspen Lawrance L, RN Will remain free from infection 02/12/2017 2329 - Progressing by Renelda MomWorley, Jiyaan Steinhauser L, RN

## 2017-02-13 DIAGNOSIS — Z954 Presence of other heart-valve replacement: Secondary | ICD-10-CM | POA: Diagnosis not present

## 2017-02-13 DIAGNOSIS — I352 Nonrheumatic aortic (valve) stenosis with insufficiency: Secondary | ICD-10-CM | POA: Diagnosis not present

## 2017-02-13 DIAGNOSIS — G8918 Other acute postprocedural pain: Secondary | ICD-10-CM | POA: Diagnosis not present

## 2017-02-13 DIAGNOSIS — E118 Type 2 diabetes mellitus with unspecified complications: Secondary | ICD-10-CM | POA: Diagnosis not present

## 2017-02-13 DIAGNOSIS — Z298 Encounter for other specified prophylactic measures: Secondary | ICD-10-CM | POA: Diagnosis not present

## 2017-02-13 DIAGNOSIS — I499 Cardiac arrhythmia, unspecified: Secondary | ICD-10-CM | POA: Diagnosis not present

## 2017-02-13 DIAGNOSIS — M6281 Muscle weakness (generalized): Secondary | ICD-10-CM | POA: Diagnosis not present

## 2017-02-13 DIAGNOSIS — D696 Thrombocytopenia, unspecified: Secondary | ICD-10-CM | POA: Diagnosis not present

## 2017-02-13 DIAGNOSIS — R079 Chest pain, unspecified: Secondary | ICD-10-CM | POA: Diagnosis not present

## 2017-02-13 DIAGNOSIS — E119 Type 2 diabetes mellitus without complications: Secondary | ICD-10-CM | POA: Diagnosis not present

## 2017-02-13 DIAGNOSIS — Z952 Presence of prosthetic heart valve: Secondary | ICD-10-CM | POA: Diagnosis not present

## 2017-02-13 DIAGNOSIS — I350 Nonrheumatic aortic (valve) stenosis: Secondary | ICD-10-CM | POA: Diagnosis not present

## 2017-02-13 DIAGNOSIS — Z951 Presence of aortocoronary bypass graft: Secondary | ICD-10-CM | POA: Diagnosis not present

## 2017-02-13 DIAGNOSIS — E785 Hyperlipidemia, unspecified: Secondary | ICD-10-CM | POA: Diagnosis not present

## 2017-02-13 DIAGNOSIS — D500 Iron deficiency anemia secondary to blood loss (chronic): Secondary | ICD-10-CM | POA: Diagnosis not present

## 2017-02-13 DIAGNOSIS — K0510 Chronic gingivitis, plaque induced: Secondary | ICD-10-CM | POA: Diagnosis not present

## 2017-02-13 DIAGNOSIS — I2510 Atherosclerotic heart disease of native coronary artery without angina pectoris: Secondary | ICD-10-CM | POA: Diagnosis not present

## 2017-02-13 DIAGNOSIS — R2689 Other abnormalities of gait and mobility: Secondary | ICD-10-CM | POA: Diagnosis not present

## 2017-02-13 DIAGNOSIS — I252 Old myocardial infarction: Secondary | ICD-10-CM | POA: Diagnosis not present

## 2017-02-13 DIAGNOSIS — D649 Anemia, unspecified: Secondary | ICD-10-CM | POA: Diagnosis not present

## 2017-02-13 DIAGNOSIS — Z741 Need for assistance with personal care: Secondary | ICD-10-CM | POA: Diagnosis not present

## 2017-02-13 DIAGNOSIS — Z89612 Acquired absence of left leg above knee: Secondary | ICD-10-CM | POA: Diagnosis not present

## 2017-02-13 DIAGNOSIS — R350 Frequency of micturition: Secondary | ICD-10-CM | POA: Diagnosis not present

## 2017-02-13 DIAGNOSIS — I10 Essential (primary) hypertension: Secondary | ICD-10-CM | POA: Diagnosis not present

## 2017-02-13 LAB — GLUCOSE, CAPILLARY
Glucose-Capillary: 133 mg/dL — ABNORMAL HIGH (ref 65–99)
Glucose-Capillary: 146 mg/dL — ABNORMAL HIGH (ref 65–99)

## 2017-02-13 MED ORDER — ASPIRIN 325 MG PO TBEC: 325.0000 mg | DELAYED_RELEASE_TABLET | Freq: Every day | ORAL | 0 refills | Status: DC

## 2017-02-13 MED ORDER — TRAMADOL HCL 50 MG PO TABS: ORAL_TABLET | ORAL | 0 refills | Status: DC

## 2017-02-13 MED ORDER — LISINOPRIL 10 MG PO TABS: 10.0000 mg | ORAL_TABLET | Freq: Every day | ORAL | Status: DC

## 2017-02-13 MED ORDER — METOPROLOL TARTRATE 50 MG PO TABS: 50.0000 mg | ORAL_TABLET | Freq: Two times a day (BID) | ORAL | Status: DC

## 2017-02-13 MED ORDER — ACETAMINOPHEN 325 MG PO TABS: 650.0000 mg | ORAL_TABLET | Freq: Four times a day (QID) | ORAL | Status: DC | PRN

## 2017-02-13 NOTE — Clinical Social Work Placement (Signed)
   CLINICAL SOCIAL WORK PLACEMENT  NOTE  Date:  02/13/2017  Patient Details  Name: [name] MRN: [id number] Date of Birth: [birth year]  Clinical Social Work is seeking post-discharge placement for this patient at the Skilled  Nursing Facility level of care (*CSW will initial, date and re-position this form in  chart as items are completed):  Yes   Patient/family provided with [facility] Clinical Social Work Department's list of facilities offering this level of care within the geographic area requested by the patient (or if unable, by the patient's family).  Yes   Patient/family informed of their freedom to choose among providers that offer the needed level of care, that participate in Medicare, Medicaid or managed care program needed by the patient, have an available bed and are willing to accept the patient.  Yes   Patient/family informed of [facility]'s ownership interest in [facility] and [facility], as well as of the fact that they are under no obligation to receive care at these facilities.  PASRR submitted to EDS on 02/08/17     PASRR number received on 02/08/17     Existing PASRR number confirmed on       FL2 transmitted to all facilities in geographic area requested by pt/family on 02/08/17     FL2 transmitted to all facilities within larger geographic area on       Patient informed that his/her managed care company has contracts with or will negotiate with certain facilities, including the following:            Patient/family informed of bed offers received.  [facility]     Physician recommends and patient chooses bed at      [facility]17.  Patient to be transferred to facility by ptar     Patient family notified on 02/13/17 of transfer.  Name of family member notified:  fsmily at bedside      PHYSICIAN Please sign FL2     Additional Comment:     _______________________________________________ Althea CharonAshley C Denika Krone, LCSW 02/13/2017, 10:43 AM

## 2017-02-13 NOTE — Progress Notes (Signed)
CARDIAC REHAB PHASE I   Ed completed with pt, good understanding of sternal precautions, IS, mobility. [facility] to [facility].  [phone]  Harriet MassonRandi Kristan Aunya Lemler CES, ACSM 02/13/2017 11:03 AM

## 2017-02-13 NOTE — Progress Notes (Signed)
Clinical Social Worker facilitated patient discharge including contacting patient family and facility to confirm patient discharge plans.  Clinical information faxed to facility and family agreeable with plan.  CSW [facility]  .  RN to call [phone] (pt will go in room 100) for  report prior to discharge.  Clinical Social Worker will sign off for now as social work intervention is no longer needed. Please consult us again if new need arises.  Marrianne MoodAshley Latrica Clowers, MSW, [employer]WA [phone]

## 2017-02-13 NOTE — Care Management Note (Signed)
Case Management Note Previous CM note completed by Leone Havenaylor, Deborah Clinton, RN 02/07/2017, 4:22 PM   Patient Details  Name: [name] MRN: [id number] Date of Birth: 21-Mar-1950  Subjective/Objective:  From home alone, POD 1 AVR, cont to diuresis. Per pt eval rec SNF.                   Action/Plan: NCM will follow along with CSW for dc needs.   Expected Discharge Date:  02/13/17               Expected Discharge Plan:  Skilled Nursing Facility  In-House Referral:  Clinical Social Work  Discharge planning Services  CM Consult  Post Acute Care Choice:  NA Choice offered to:  NA  DME Arranged:    DME Agency:     HH Arranged:    HH Agency:     Status of Service:  Completed, signed off  If discussed at [employer] Length of Stay Meetings, dates discussed:    Discharge Disposition: skilled facility   Additional Comments:  02/13/17- 1200- Egan Sahlin RN, CM- pt for d/c to SNF today- CSW following for placement needs- RW ordered however will be arranged on discharge from SNF if still needed.   Zenda AlpersWebster, OkolonaKristi Hall, RN 02/13/2017, 12:12 PM [phone]

## 2017-02-13 NOTE — Progress Notes (Signed)
[place] (nurse [place]) for report on pt transfer to SNF.  Peripheral IVs removed, patient needs, met, awaiting [facility] transport.

## 2017-02-13 NOTE — Progress Notes (Addendum)
      [address]       [place] [id number]             [phone]        7 Days Post-Op Procedure(s) (LRB): AORTIC VALVE REPLACEMENT (AVR) (N/A) TRANSESOPHAGEAL ECHOCARDIOGRAM (TEE) (N/A)  Subjective: Patient without complaints this am.  Objective: Vital signs in last 24 hours: Temp:  [97.8 F (36.6 C)-98.7 F (37.1 C)] 97.8 F (36.6 C) (01/28 0421) Pulse Rate:  [91-110] 93 (01/28 0421) Cardiac Rhythm: Sinus tachycardia (01/27 2006) Resp:  [18-29] 18 (01/28 0421) BP: (118-129)/(76-94) 119/76 (01/28 0421) SpO2:  [89 %-95 %] 90 % (01/28 0421) Weight:  [143 lb 11.8 oz (65.2 kg)] 143 lb 11.8 oz (65.2 kg) (01/28 0421)  Pre op weight 72.5 kg Current Weight  02/13/17 143 lb 11.8 oz (65.2 kg)      Intake/Output from previous day: 01/27 0701 - 01/28 0700 In: 480 [P.O.:480] Out: -    Physical Exam:  Cardiovascular: RRR Pulmonary: Mostly clear to auscultation bilaterally Abdomen: Soft, non tender, bowel sounds present.   Extremity: Right LE with no edema Wounds: Sternal dressing is clean clean and dry.  No erythema or signs of infection.  Lab Results: CBC: No results for input(s): WBC, HGB, HCT, PLT in the last 72 hours. BMET:  No results for input(s): NA, K, CL, CO2, GLUCOSE, BUN, CREATININE, CALCIUM in the last 72 hours.  PT/INR:  Lab Results  Component Value Date   INR 1.32 02/06/2017   INR 0.98 02/02/2017   INR 1.0 11/04/2016   ABG:  INR: Will add last result for INR, ABG once components are confirmed Will add last 4 CBG results once components are confirmed  Assessment/Plan:  1. CV - ST at times yesterday. Now on Lopressor 50 mg bid and Lisinopril 10 mg daily.  2.  Pulmonary - Per nurse, he does desat on room air at night. Will arrange for oxygen at night (1L). Diffusion capacity was decreased on PFTs.  Encourage incentive spirometer. 3. Volume Overload - On Lasix 40 mg daily. 4.  Acute blood loss anemia - H and H yesterday stable  at 10.4 and 32.9. 5. DM-CBGs 146/169/133. Continue Metformin.Pre op HGA1C 7.9. Will need close medical follow up after discharge. 6. Chest tube sutures will remain 7. Will need SNF and if bed available discharge today  Lillion Elbert M ZimmermanPA-C 02/13/2017,7:16 AM

## 2017-02-15 DIAGNOSIS — I2510 Atherosclerotic heart disease of native coronary artery without angina pectoris: Secondary | ICD-10-CM | POA: Diagnosis not present

## 2017-02-15 DIAGNOSIS — Z952 Presence of prosthetic heart valve: Secondary | ICD-10-CM | POA: Diagnosis not present

## 2017-02-15 DIAGNOSIS — M6281 Muscle weakness (generalized): Secondary | ICD-10-CM | POA: Diagnosis not present

## 2017-02-15 DIAGNOSIS — Z89612 Acquired absence of left leg above knee: Secondary | ICD-10-CM | POA: Diagnosis not present

## 2017-02-15 DIAGNOSIS — R2689 Other abnormalities of gait and mobility: Secondary | ICD-10-CM | POA: Diagnosis not present

## 2017-02-17 DIAGNOSIS — Z298 Encounter for other specified prophylactic measures: Secondary | ICD-10-CM | POA: Diagnosis not present

## 2017-02-17 DIAGNOSIS — G8918 Other acute postprocedural pain: Secondary | ICD-10-CM | POA: Diagnosis not present

## 2017-02-17 DIAGNOSIS — M6281 Muscle weakness (generalized): Secondary | ICD-10-CM | POA: Diagnosis not present

## 2017-02-17 DIAGNOSIS — I352 Nonrheumatic aortic (valve) stenosis with insufficiency: Secondary | ICD-10-CM | POA: Diagnosis not present

## 2017-02-17 DIAGNOSIS — I2510 Atherosclerotic heart disease of native coronary artery without angina pectoris: Secondary | ICD-10-CM | POA: Diagnosis not present

## 2017-02-20 DIAGNOSIS — I2510 Atherosclerotic heart disease of native coronary artery without angina pectoris: Secondary | ICD-10-CM | POA: Diagnosis not present

## 2017-02-20 DIAGNOSIS — M6281 Muscle weakness (generalized): Secondary | ICD-10-CM | POA: Diagnosis not present

## 2017-02-20 DIAGNOSIS — G8918 Other acute postprocedural pain: Secondary | ICD-10-CM | POA: Diagnosis not present

## 2017-02-20 DIAGNOSIS — Z298 Encounter for other specified prophylactic measures: Secondary | ICD-10-CM | POA: Diagnosis not present

## 2017-02-20 DIAGNOSIS — I352 Nonrheumatic aortic (valve) stenosis with insufficiency: Secondary | ICD-10-CM | POA: Diagnosis not present

## 2017-02-23 DIAGNOSIS — I352 Nonrheumatic aortic (valve) stenosis with insufficiency: Secondary | ICD-10-CM | POA: Diagnosis not present

## 2017-02-23 DIAGNOSIS — Z298 Encounter for other specified prophylactic measures: Secondary | ICD-10-CM | POA: Diagnosis not present

## 2017-02-23 DIAGNOSIS — I2510 Atherosclerotic heart disease of native coronary artery without angina pectoris: Secondary | ICD-10-CM | POA: Diagnosis not present

## 2017-02-23 DIAGNOSIS — G8918 Other acute postprocedural pain: Secondary | ICD-10-CM | POA: Diagnosis not present

## 2017-02-23 DIAGNOSIS — M6281 Muscle weakness (generalized): Secondary | ICD-10-CM | POA: Diagnosis not present

## 2017-02-27 ENCOUNTER — Encounter (INDEPENDENT_AMBULATORY_CARE_PROVIDER_SITE_OTHER): Payer: Self-pay

## 2017-02-27 ENCOUNTER — Ambulatory Visit (INDEPENDENT_AMBULATORY_CARE_PROVIDER_SITE_OTHER): Payer: Medicare Other | Admitting: Physician Assistant

## 2017-02-27 ENCOUNTER — Encounter: Payer: Self-pay | Admitting: Physician Assistant

## 2017-02-27 VITALS — BP 90/60 | HR 74 | Ht 69.0 in | Wt 156.4 lb

## 2017-02-27 DIAGNOSIS — I350 Nonrheumatic aortic (valve) stenosis: Secondary | ICD-10-CM | POA: Diagnosis not present

## 2017-02-27 DIAGNOSIS — I2510 Atherosclerotic heart disease of native coronary artery without angina pectoris: Secondary | ICD-10-CM | POA: Diagnosis not present

## 2017-02-27 DIAGNOSIS — E785 Hyperlipidemia, unspecified: Secondary | ICD-10-CM | POA: Diagnosis not present

## 2017-02-27 LAB — BASIC METABOLIC PANEL
BUN / CREAT RATIO: 17 (ref 10–24)
BUN: 18 mg/dL (ref 8–27)
CHLORIDE: 96 mmol/L (ref 96–106)
CO2: 22 mmol/L (ref 20–29)
Calcium: 9.6 mg/dL (ref 8.6–10.2)
Creatinine, Ser: 1.05 mg/dL (ref 0.76–1.27)
GFR calc non Af Amer: 74 mL/min/{1.73_m2} (ref >59)
GFR, EST AFRICAN AMERICAN: 85 mL/min/{1.73_m2} (ref >59)
GLUCOSE: 199 mg/dL — AB (ref 65–99)
Potassium: 5.4 mmol/L — ABNORMAL HIGH (ref 3.5–5.2)
SODIUM: 135 mmol/L (ref 134–144)

## 2017-02-27 LAB — CBC
Hematocrit: 36.9 % — ABNORMAL LOW (ref 37.5–51.0)
Hemoglobin: 12 g/dL — ABNORMAL LOW (ref 13.0–17.7)
MCH: 27.1 pg (ref 26.6–33.0)
MCHC: 32.5 g/dL (ref 31.5–35.7)
MCV: 84 fL (ref 79–97)
PLATELETS: 380 10*3/uL — AB (ref 150–379)
RBC: 4.42 x10E6/uL (ref 4.14–5.80)
RDW: 14.5 % (ref 12.3–15.4)
WBC: 12.7 10*3/uL — ABNORMAL HIGH (ref 3.4–10.8)

## 2017-02-27 MED ORDER — LISINOPRIL 5 MG PO TABS: 5.0000 mg | ORAL_TABLET | Freq: Every day | ORAL | 3 refills | Status: DC

## 2017-02-27 MED ORDER — PANTOPRAZOLE SODIUM 40 MG PO TBEC: 40.0000 mg | DELAYED_RELEASE_TABLET | Freq: Every day | ORAL | 0 refills | Status: DC

## 2017-02-27 NOTE — Patient Instructions (Addendum)
Medication Instructions:  Your physician has recommended you make the following change in your medication:  1.  DECREASE the Lisinopril to 5 mg daily (you may take 1/2 of the ones you have now and I have sent in a new prescription to your pharmacy)  Labwork: TODAY:  BMET & CBC  Testing/Procedures: None ordered  Follow-Up: Your physician recommends that you schedule a follow-up appointment in: 1 MONTH WITH DR. Purvis SheffieldKONESWARAN IN THE  OFFICE    Any Other Special Instructions Will Be Listed Below (If Applicable). Call your primary care physician regarding the blood in your stool    If you need a refill on your cardiac medications before your next appointment, please call your pharmacy.
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
## 2017-02-28 ENCOUNTER — Telehealth: Payer: Self-pay | Admitting: *Deleted

## 2017-02-28 DIAGNOSIS — E875 Hyperkalemia: Secondary | ICD-10-CM | POA: Diagnosis not present

## 2017-02-28 DIAGNOSIS — E1129 Type 2 diabetes mellitus with other diabetic kidney complication: Secondary | ICD-10-CM | POA: Diagnosis not present

## 2017-02-28 NOTE — Telephone Encounter (Signed)
-----   Message from Dyann KiefMichele M Lenze, PA-C sent at 02/28/2017  7:50 AM EST ----- Hemoglobin actually much better so not anemic which is good.  Potassium was high.  Need a  repeat today.  This can be drawn in [place] since he lives there.

## 2017-03-01 LAB — BASIC METABOLIC PANEL
BUN/Creatinine Ratio: 13 (ref 10–24)
BUN: 14 mg/dL (ref 8–27)
CALCIUM: 9.6 mg/dL (ref 8.6–10.2)
CO2: 19 mmol/L — ABNORMAL LOW (ref 20–29)
CREATININE: 1.05 mg/dL (ref 0.76–1.27)
Chloride: 98 mmol/L (ref 96–106)
GFR calc non Af Amer: 74 mL/min/{1.73_m2} (ref >59)
GFR, EST AFRICAN AMERICAN: 85 mL/min/{1.73_m2} (ref >59)
Glucose: 207 mg/dL — ABNORMAL HIGH (ref 65–99)
Potassium: 5.1 mmol/L (ref 3.5–5.2)
Sodium: 137 mmol/L (ref 134–144)

## 2017-03-06 DIAGNOSIS — I350 Nonrheumatic aortic (valve) stenosis: Secondary | ICD-10-CM | POA: Diagnosis not present

## 2017-03-06 DIAGNOSIS — Z89612 Acquired absence of left leg above knee: Secondary | ICD-10-CM | POA: Diagnosis not present

## 2017-03-06 DIAGNOSIS — E1129 Type 2 diabetes mellitus with other diabetic kidney complication: Secondary | ICD-10-CM | POA: Diagnosis not present

## 2017-03-06 DIAGNOSIS — Z6824 Body mass index (BMI) 24.0-24.9, adult: Secondary | ICD-10-CM | POA: Diagnosis not present

## 2017-03-14 ENCOUNTER — Other Ambulatory Visit: Payer: Self-pay | Admitting: Surgery

## 2017-03-14 DIAGNOSIS — I350 Nonrheumatic aortic (valve) stenosis: Secondary | ICD-10-CM

## 2017-03-15 ENCOUNTER — Ambulatory Visit: Payer: Self-pay | Admitting: Surgery

## 2017-03-22 ENCOUNTER — Ambulatory Visit
Admission: RE | Admit: 2017-03-22 | Discharge: 2017-03-22 | Disposition: A | Discharge status: Home / Self Care | Payer: Medicare Other | Source: Ambulatory Visit | Attending: Surgery | Admitting: Surgery

## 2017-03-22 ENCOUNTER — Ambulatory Visit (INDEPENDENT_AMBULATORY_CARE_PROVIDER_SITE_OTHER): Payer: Self-pay | Admitting: Surgery

## 2017-03-22 VITALS — BP 149/95 | HR 96 | Resp 20 | Ht 69.0 in | Wt 158.0 lb

## 2017-03-22 DIAGNOSIS — I350 Nonrheumatic aortic (valve) stenosis: Secondary | ICD-10-CM

## 2017-03-22 DIAGNOSIS — Z952 Presence of prosthetic heart valve: Secondary | ICD-10-CM

## 2017-03-26 ENCOUNTER — Encounter: Payer: Self-pay | Admitting: Surgery

## 2017-03-26 NOTE — Progress Notes (Signed)
     HPI: Patient returns for routine postoperative follow-up having undergone aortic valve replacement using a 23 mm Edwards pericardial valve on 02/06/2017. The patient's early postoperative recovery while in the hospital was notable for an uncomplicated postoperative course. Since hospital discharge the patient reports he has been feeling well.  He is walking daily without chest pain or shortness of breath.  His stamina continues to improve.   Current Outpatient Medications  Medication Sig Dispense Refill  . acetaminophen (TYLENOL) 325 MG tablet Take 2 tablets (650 mg total) by mouth every 6 (six) hours as needed for mild pain.    [name]. aspirin EC 325 MG EC tablet Take 1 tablet (325 mg total) by mouth daily. 30 tablet 0  . chlorhexidine (PERIDEX) 0.12 % solution Rinse with 15 mls twice daily for 30 seconds. Use after breakfast and at bedtime. Spit out excess. Do not swallow. 960 mL prn  . linagliptin (TRADJENTA) 5 MG TABS tablet Take 5 mg by mouth daily.    [name]. lisinopril (PRINIVIL,ZESTRIL) 5 MG tablet Take 1 tablet (5 mg total) by mouth daily. 90 tablet 3  . metoprolol tartrate (LOPRESSOR) 50 MG tablet Take 1 tablet (50 mg total) by mouth 2 (two) times daily.    . pantoprazole (PROTONIX) 40 MG tablet Take 1 tablet (40 mg total) by mouth daily. 30 tablet 0  . simvastatin (ZOCOR) 40 MG tablet Take 40 mg by mouth at bedtime.      . traMADol (ULTRAM) 50 MG tablet Take 50 mg by mouth every 4-6 hours PRN moderate to severe pain. 30 tablet 0  . tamsulosin (FLOMAX) 0.4 MG CAPS capsule Take 0.4 mg by mouth daily as needed (frequent urination).      No current facility-administered medications for this visit.     Physical Exam: BP (!) 149/95   Pulse 96   Resp 20   Ht 5\' 9"  (1.753 m)   Wt 158 lb (71.7 kg)   SpO2 95% Comment: RA  BMI 23.33 kg/m  He looks well Lungs are clear Cardiac exam shows a regular rate and rhythm with normal heart sounds. The chest incision is healing well and the sternum  is stable. There is no significant edema.   Diagnostic Tests:  CLINICAL DATA:  Pt c/o severe aortic stenosis. Pt has no complaints today. Hx of aortic valve replacement 02/06/17, CAD, diabetes, heart murmur, MI.  EXAM: CHEST - 2 VIEW  COMPARISON:  02/09/2017  FINDINGS: Improvement in interstitial edema.  No confluent airspace disease.  Heart size upper limits normal.  Previous  AVR.  No effusion.  Previous median sternotomy.  IMPRESSION: 1. Interval improvement in previously noted interstitial edema. 2. No acute findings post AVR.   Electronically Signed   By: Corlis Leak  Hassell M.D.   On: 03/22/2017 14:19   Impression: Overall I think he is doing well. I encouraged him to continue walking. He is planning to participate in cardiac rehab. I told him he could drive his car but should not lift anything heavier than 10 lbs for three months postop.   Plan:  He will continue to follow-up with Dr. Ouida SillsFagan and Dr. Purvis SheffieldKoneswaran and will contact me if he develops any problems with his incisions.   Alleen BorneBryan K Bartle, MD [facility] [phone]

## 2017-03-29 ENCOUNTER — Encounter: Payer: Self-pay | Admitting: Cardiovascular Disease

## 2017-03-29 ENCOUNTER — Ambulatory Visit (INDEPENDENT_AMBULATORY_CARE_PROVIDER_SITE_OTHER): Payer: Medicare Other | Admitting: Cardiovascular Disease

## 2017-03-29 VITALS — BP 126/87 | HR 84 | Ht 69.0 in | Wt 163.0 lb

## 2017-03-29 DIAGNOSIS — I25118 Atherosclerotic heart disease of native coronary artery with other forms of angina pectoris: Secondary | ICD-10-CM

## 2017-03-29 DIAGNOSIS — Z955 Presence of coronary angioplasty implant and graft: Secondary | ICD-10-CM | POA: Diagnosis not present

## 2017-03-29 DIAGNOSIS — Z952 Presence of prosthetic heart valve: Secondary | ICD-10-CM

## 2017-03-29 DIAGNOSIS — E785 Hyperlipidemia, unspecified: Secondary | ICD-10-CM

## 2017-03-29 DIAGNOSIS — I10 Essential (primary) hypertension: Secondary | ICD-10-CM | POA: Diagnosis not present

## 2017-03-29 NOTE — Patient Instructions (Signed)
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
## 2017-04-20 ENCOUNTER — Other Ambulatory Visit: Payer: Self-pay | Admitting: Physician Assistant

## 2017-05-30 DIAGNOSIS — E1129 Type 2 diabetes mellitus with other diabetic kidney complication: Secondary | ICD-10-CM | POA: Diagnosis not present

## 2017-06-06 DIAGNOSIS — E1122 Type 2 diabetes mellitus with diabetic chronic kidney disease: Secondary | ICD-10-CM | POA: Diagnosis not present

## 2017-06-06 DIAGNOSIS — I2510 Atherosclerotic heart disease of native coronary artery without angina pectoris: Secondary | ICD-10-CM | POA: Diagnosis not present

## 2017-07-06 ENCOUNTER — Other Ambulatory Visit: Payer: Self-pay

## 2017-07-06 NOTE — Patient Outreach (Signed)
[facility] Care Management  07/06/2017  [name] 05-19-1950 [id number]   Medication Adherence call to Mr. Marianna FussWillie [name] spoke with patient he said he will pick up both medications today 07/06/17 on Lisinopril 5 mg and Simvastatin 40 mg for a 90 days supply. [facility] said they have both medication ready for patient to pick up. [name] is showing past due under [employer].  Lillia AbedAna Ollison-Moran CPhT Pharmacy Technician [facility] Care Management Direct Dial [phone]  Fax [phone] [email or web address]

## 2017-09-05 DIAGNOSIS — Z125 Encounter for screening for malignant neoplasm of prostate: Secondary | ICD-10-CM | POA: Diagnosis not present

## 2017-09-05 DIAGNOSIS — I2510 Atherosclerotic heart disease of native coronary artery without angina pectoris: Secondary | ICD-10-CM | POA: Diagnosis not present

## 2017-09-05 DIAGNOSIS — E1129 Type 2 diabetes mellitus with other diabetic kidney complication: Secondary | ICD-10-CM | POA: Diagnosis not present

## 2017-09-05 DIAGNOSIS — Z79899 Other long term (current) drug therapy: Secondary | ICD-10-CM | POA: Diagnosis not present

## 2017-09-12 DIAGNOSIS — M25519 Pain in unspecified shoulder: Secondary | ICD-10-CM | POA: Diagnosis not present

## 2017-09-12 DIAGNOSIS — E785 Hyperlipidemia, unspecified: Secondary | ICD-10-CM | POA: Diagnosis not present

## 2017-09-12 DIAGNOSIS — E1129 Type 2 diabetes mellitus with other diabetic kidney complication: Secondary | ICD-10-CM | POA: Diagnosis not present

## 2017-09-12 DIAGNOSIS — D649 Anemia, unspecified: Secondary | ICD-10-CM | POA: Diagnosis not present

## 2017-09-15 ENCOUNTER — Encounter: Payer: Self-pay | Admitting: Orthopaedic Surgery

## 2017-09-27 ENCOUNTER — Ambulatory Visit (INDEPENDENT_AMBULATORY_CARE_PROVIDER_SITE_OTHER): Payer: Medicare Other

## 2017-09-27 ENCOUNTER — Ambulatory Visit (INDEPENDENT_AMBULATORY_CARE_PROVIDER_SITE_OTHER): Payer: Medicare Other | Admitting: Orthopaedic Surgery

## 2017-09-27 ENCOUNTER — Encounter: Payer: Self-pay | Admitting: Orthopaedic Surgery

## 2017-09-27 VITALS — BP 142/86 | HR 54 | Ht 69.0 in | Wt 168.0 lb

## 2017-09-27 DIAGNOSIS — M25511 Pain in right shoulder: Secondary | ICD-10-CM

## 2017-09-27 DIAGNOSIS — G8929 Other chronic pain: Secondary | ICD-10-CM

## 2017-09-27 NOTE — Progress Notes (Signed)
Patient ZO:XWRUEA [name], male DOB:Dec 07, 1950, 67 y.o. VWU:[id number]  Chief Complaint  Patient presents with  . Shoulder Pain    Bilateral but right is worse    HPI  [name] is a 68 y.o. male who has right shoulder pain.  I saw him in November of 2017 for his right shoulder.  He took Aleve after that and got better.  In January of this year he had open heart surgery.  He did well from that but his right shoulder has been getting worse.  He has pain with overhead use, he has pain rolling over on it at night.  He cannot take Aleve now as he is on a blood thinner.  He has no numbness, no swelling, no redness of the shoulder.  He is concerned it is getting worse and his shoulder motion is less.  He has no trauma.  He has been followed for this by Dr. Ouida Sills and is referred her for further evaluation.   Body mass index is 24.81 kg/m.  ROS  Review of Systems  HENT: Negative for congestion.   Respiratory: Negative for cough and shortness of breath.   Cardiovascular: Negative for chest pain and leg swelling.  Endocrine: Positive for cold intolerance.  Musculoskeletal: Positive for arthralgias.  Allergic/Immunologic: Positive for environmental allergies.  All other systems reviewed and are negative.   All other systems reviewed and are negative.  The following is a summary of the past history medically, past history surgically, known current medicines, social history and family history.  This information is gathered electronically by the computer from prior information and documentation.  I review this each visit and have found including this information at this point in the chart is beneficial and informative.    Past Medical History:  Diagnosis Date  . Aortic stenosis   . Arthritis   . CAD (coronary artery disease)   . Diabetes mellitus    Type II  . Heart murmur   . Hyperlipidemia   . MI (myocardial infarction) (HCC)   . S/P AKA (above knee amputation) (HCC)      Past Surgical History:  Procedure Laterality Date  . ABOVE KNEE LEG AMPUTATION Left 1972  . AORTIC VALVE REPLACEMENT N/A 02/06/2017   Procedure: AORTIC VALVE REPLACEMENT (AVR);  Surgeon: Alleen Borne, MD;  Location[facility] OR;  Service: Open Heart Surgery;  Laterality: N/A;  . COLONOSCOPY    . CORONARY ANGIOPLASTY WITH STENT PLACEMENT    . LEG SURGERY     Repair of left leg trauma  . MULTIPLE EXTRACTIONS WITH ALVEOLOPLASTY N/A 11/29/2016   Procedure: Extraction of tooth #'s 2,95,62,13 and 32 with alveoloplasty and gross debridement of remaining teeth;  Surgeon: Charlynne Pander, DDS;  Location: [facility] OR;  Service: Oral Surgery;  Laterality: N/A;  . RIGHT/LEFT HEART CATH AND CORONARY ANGIOGRAPHY N/A 11/14/2016   Procedure: RIGHT/LEFT HEART CATH AND CORONARY ANGIOGRAPHY;  Surgeon: Kathleene Hazel, MD;  Location: [facility] INVASIVE CV LAB;  Service: Cardiovascular;  Laterality: N/A;  . TEE WITHOUT CARDIOVERSION N/A 02/06/2017   Procedure: TRANSESOPHAGEAL ECHOCARDIOGRAM (TEE);  Surgeon: Alleen Borne, MD;  Location: [facility] OR;  Service: Open Heart Surgery;  Laterality: N/A;    Family History  Problem Relation Age of Onset  . Hypertension Mother   . Heart attack Father     Social History Social History   Tobacco Use  . Smoking status: Former Smoker    Packs/day: 0.30    Years: 60.00  Pack years: 18.00    Types: Cigars, Cigarettes    Start date: 01/18/1956    Last attempt to quit: 11/25/2015    Years since quitting: 1.8  . Smokeless tobacco: Never Used  Substance Use Topics  . Alcohol use: No    Alcohol/week: 0.0 standard drinks    Frequency: Never    Comment: Occasional  . Drug use: No    No Known Allergies  Current Outpatient Medications  Medication Sig Dispense Refill  . acetaminophen (TYLENOL) 325 MG tablet Take 2 tablets (650 mg total) by mouth every 6 (six) hours as needed for mild pain.    [name] aspirin EC 325 MG EC tablet Take 1 tablet (325 mg total) by mouth daily. 30  tablet 0  . chlorhexidine (PERIDEX) 0.12 % solution Rinse with 15 mls twice daily for 30 seconds. Use after breakfast and at bedtime. Spit out excess. Do not swallow. 960 mL prn  . linagliptin (TRADJENTA) 5 MG TABS tablet Take 5 mg by mouth daily.    . metFORMIN (GLUCOPHAGE) 500 MG tablet Take 500 mg by mouth 2 (two) times daily with a meal.    . metoprolol tartrate (LOPRESSOR) 25 MG tablet Take 12.5 mg by mouth 2 (two) times daily.    . pantoprazole (PROTONIX) 40 MG tablet TAKE 1 TABLET(40 MG) BY MOUTH DAILY 30 tablet 11  . simvastatin (ZOCOR) 40 MG tablet Take 40 mg by mouth at bedtime.      . tamsulosin (FLOMAX) 0.4 MG CAPS capsule Take 0.4 mg by mouth daily as needed (frequent urination).     . traMADol (ULTRAM) 50 MG tablet Take 50 mg by mouth every 4-6 hours PRN moderate to severe pain. 30 tablet 0   No current facility-administered medications for this visit.      Physical Exam  Blood pressure (!) 142/86, pulse (!) 54, height 5\' 9"  (1.753 m), weight 168 lb (76.2 kg).  Constitutional: overall normal hygiene, normal nutrition, well developed, normal grooming, normal body habitus. Assistive device:none  Musculoskeletal: gait and station Limp none, muscle tone and strength are normal, no tremors or atrophy is present.  .  Neurological: coordination overall normal.  Deep tendon reflex/nerve stretch intact.  Sensation normal.  Cranial nerves II-XII intact.   Skin:   Normal overall no scars, lesions, ulcers or rashes. No psoriasis.  Psychiatric: Alert and oriented x 3.  Recent memory intact, remote memory unclear.  Normal mood and affect. Well groomed.  Good eye contact.  Cardiovascular: overall no swelling, no varicosities, no edema bilaterally, normal temperatures of the legs and arms, no clubbing, cyanosis and good capillary refill.  Examination of right Upper Extremity is done.  Inspection:   Overall:  Elbow non-tender without crepitus or defects, forearm non-tender without  crepitus or defects, wrist non-tender without crepitus or defects, hand non-tender.    Shoulder: with glenohumeral joint tenderness, without effusion.   Upper arm: without swelling and tenderness   Range of motion:   Overall:  Full range of motion of the elbow, full range of motion of wrist and full range of motion in fingers.   Shoulder:  right  145 degrees forward flexion; 110 degrees abduction; 30 degrees internal rotation, 30 degrees external rotation, 10 degrees extension, 40 degrees adduction.   Stability:   Overall:  Shoulder, elbow and wrist stable   Strength and Tone:   Overall full shoulder muscles strength, full upper arm strength and normal upper arm bulk and tone.  Lymphatic: palpation is normal.  All  other systems reviewed and are negative   The patient has been educated about the nature of the problem(s) and counseled on treatment options.  The patient appeared to understand what I have discussed and is in agreement with it.  X-rays were done of the left shoulder, reported separately.  Encounter Diagnosis  Name Primary?  . Chronic right shoulder pain Yes    PLAN Call if any problems.  Precautions discussed.  Continue current medications.   PROCEDURE NOTE:  The patient request injection, verbal consent was obtained.  The right shoulder was prepped appropriately after time out was performed.   Sterile technique was observed and injection of 1 cc of Depo-Medrol 40 mg with several cc's of plain xylocaine. Anesthesia was provided by ethyl chloride and a 20-gauge needle was used to inject the shoulder area. A posterior approach was used.  The injection was tolerated well.  A band aid dressing was applied.  The patient was advised to apply ice later today and tomorrow to the injection sight as needed.  I will arrange PT/OT for the right shoulder.  I will get a MRI of the right shoulder  Return to clinic after MRI of the right shoulder   Electronically  Signed Darreld Mclean, MD 9/11/20198:44 AM

## 2017-09-28 ENCOUNTER — Telehealth: Payer: Self-pay | Admitting: Orthopaedic Surgery

## 2017-09-28 NOTE — Telephone Encounter (Signed)
Fax sent with information.

## 2017-09-28 NOTE — Telephone Encounter (Signed)
[name] with [facility] called to let Dr. Hilda LiasKeeling know that due to unforseen reasons the therapist will be out for awhile. They have called and scheduled Mr. [name] for 10/26/17 @ 10 am for physical therapy. The patient told them that he has a return appointment with Dr. Hilda LiasKeeling on 9/25.  She is asking for recent note, medication and allergy list be faxed, since he is scheduled.  Phone:[phone] Fax: [phone]

## 2017-09-29 ENCOUNTER — Encounter: Payer: Self-pay | Admitting: Cardiovascular Disease

## 2017-09-29 ENCOUNTER — Ambulatory Visit (INDEPENDENT_AMBULATORY_CARE_PROVIDER_SITE_OTHER): Payer: Medicare Other | Admitting: Cardiovascular Disease

## 2017-09-29 VITALS — BP 120/70 | HR 60 | Ht 69.0 in | Wt 168.0 lb

## 2017-09-29 DIAGNOSIS — I10 Essential (primary) hypertension: Secondary | ICD-10-CM

## 2017-09-29 DIAGNOSIS — E785 Hyperlipidemia, unspecified: Secondary | ICD-10-CM

## 2017-09-29 DIAGNOSIS — I25118 Atherosclerotic heart disease of native coronary artery with other forms of angina pectoris: Secondary | ICD-10-CM

## 2017-09-29 DIAGNOSIS — Z952 Presence of prosthetic heart valve: Secondary | ICD-10-CM | POA: Diagnosis not present
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
## 2017-09-29 NOTE — Patient Instructions (Signed)
Your physician wants you to follow-up in: 6 months with physicians assistant You will receive a reminder letter in the mail two months in advance. If you don't receive a letter, please call our office to schedule the follow-up appointment.    Your physician recommends that you continue on your current medications as directed. Please refer to the Current Medication list given to you today.    If you need a refill on your cardiac medications before your next appointment, please call your pharmacy.      No lab work or tests ordered today.       Thank you for choosing [facility] !

## 2017-10-04 ENCOUNTER — Ambulatory Visit (HOSPITAL_COMMUNITY)
Admission: RE | Admit: 2017-10-04 | Discharge: 2017-10-04 | Disposition: A | Discharge status: Home / Self Care | Payer: Medicare Other | Source: Ambulatory Visit | Attending: Orthopaedic Surgery | Admitting: Orthopaedic Surgery

## 2017-10-04 ENCOUNTER — Ambulatory Visit: Payer: Medicare Other | Admitting: Orthopaedic Surgery

## 2017-10-04 DIAGNOSIS — M25511 Pain in right shoulder: Secondary | ICD-10-CM

## 2017-10-04 DIAGNOSIS — G8929 Other chronic pain: Secondary | ICD-10-CM | POA: Diagnosis present

## 2017-10-04 DIAGNOSIS — M7551 Bursitis of right shoulder: Secondary | ICD-10-CM | POA: Diagnosis not present

## 2017-10-04 DIAGNOSIS — M12811 Other specific arthropathies, not elsewhere classified, right shoulder: Secondary | ICD-10-CM | POA: Insufficient documentation

## 2017-10-11 ENCOUNTER — Ambulatory Visit (INDEPENDENT_AMBULATORY_CARE_PROVIDER_SITE_OTHER): Payer: Medicare Other | Admitting: Orthopaedic Surgery

## 2017-10-11 ENCOUNTER — Encounter: Payer: Self-pay | Admitting: Orthopaedic Surgery

## 2017-10-11 VITALS — BP 124/75 | HR 68 | Ht 69.0 in | Wt 167.0 lb

## 2017-10-11 DIAGNOSIS — G8929 Other chronic pain: Secondary | ICD-10-CM | POA: Diagnosis not present

## 2017-10-11 DIAGNOSIS — M25511 Pain in right shoulder: Secondary | ICD-10-CM | POA: Diagnosis not present

## 2017-10-11 NOTE — Progress Notes (Signed)
Patient ZO:XWRUEA [name], male DOB:03-03-1950, 67 y.o. VWU:[id number]  Chief Complaint  Patient presents with  . Shoulder Pain    Right shoulder MRI     HPI  [name] is a 67 y.o. male who has continued right shoulder pain.  He had a MRI which showed: IMPRESSION: 1. Partial-thickness articular surface tearing of the supraspinatus tendon with tendon delamination causing distal tendon thinning. There is moderate anterior supraspinatus tendinopathy. Also there are bony ossicles along the articular margin of the supraspinatus tendon posteriorly which may reflect free osteochondral fragments loose in the joint along the tendon, or possibly ossific tendinopathy. 2. Mild infraspinatus and biceps tendinopathy. 3. Severe degenerative glenohumeral arthropathy and moderate degenerative AC joint arthropathy. Small pre acromial os acromiale. 4. Trace subacromial subdeltoid bursitis. 5. There is some deformity and irregularity of the humeral head which is probably from an old healed fracture, less likely from chronic degenerative subsidence. 6. Degenerative subcortical marrow edema in the glenoid especially inferiorly and posteriorly. 7. Degeneration of the posterior labrum.  I have gone over the results with him.  I do not know if surgery would be of any benefit but I will have Dr. Romeo Apple see him for evaluation.  He had open heart surgery in January and has done well.  He had an injection to the right shoulder last time and it helped.  He has therapy scheduled in [place] for the shoulder.   Body mass index is 24.66 kg/m.  ROS  Review of Systems  HENT: Negative for congestion.   Respiratory: Negative for cough and shortness of breath.   Cardiovascular: Negative for chest pain and leg swelling.  Endocrine: Positive for cold intolerance.  Musculoskeletal: Positive for arthralgias.  Allergic/Immunologic: Positive for environmental allergies.  All other systems reviewed  and are negative.   All other systems reviewed and are negative.  The following is a summary of the past history medically, past history surgically, known current medicines, social history and family history.  This information is gathered electronically by the computer from prior information and documentation.  I review this each visit and have found including this information at this point in the chart is beneficial and informative.    Past Medical History:  Diagnosis Date  . Aortic stenosis   . Arthritis   . CAD (coronary artery disease)   . Diabetes mellitus    Type II  . Heart murmur   . Hyperlipidemia   . MI (myocardial infarction) (HCC)   . S/P AKA (above knee amputation) (HCC)     Past Surgical History:  Procedure Laterality Date  . ABOVE KNEE LEG AMPUTATION Left 1972  . AORTIC VALVE REPLACEMENT N/A 02/06/2017   Procedure: AORTIC VALVE REPLACEMENT (AVR);  Surgeon: Alleen Borne, MD;  [facility] OR;  Service: Open Heart Surgery;  Laterality: N/A;  . COLONOSCOPY    . CORONARY ANGIOPLASTY WITH STENT PLACEMENT    . LEG SURGERY     Repair of left leg trauma  . MULTIPLE EXTRACTIONS WITH ALVEOLOPLASTY N/A 11/29/2016   Procedure: Extraction of tooth #'s 2,95,62,13 and 32 with alveoloplasty and gross debridement of remaining teeth;  Surgeon: Charlynne Pander, DDS;  Location: [facility] OR;  Service: Oral Surgery;  Laterality: N/A;  . RIGHT/LEFT HEART CATH AND CORONARY ANGIOGRAPHY N/A 11/14/2016   Procedure: RIGHT/LEFT HEART CATH AND CORONARY ANGIOGRAPHY;  Surgeon: Kathleene Hazel, MD;  Location: [facility] INVASIVE CV LAB;  Service: Cardiovascular;  Laterality: N/A;  . TEE WITHOUT CARDIOVERSION N/A 02/06/2017  Procedure: TRANSESOPHAGEAL ECHOCARDIOGRAM (TEE);  Surgeon: Alleen Borne, MD;  Location: [facility] OR;  Service: Open Heart Surgery;  Laterality: N/A;    Family History  Problem Relation Age of Onset  . Hypertension Mother   . Heart attack Father     Social History Social  History   Tobacco Use  . Smoking status: Former Smoker    Packs/day: 0.30    Years: 60.00    Pack years: 18.00    Types: Cigars, Cigarettes    Start date: 01/18/1956    Last attempt to quit: 11/25/2015    Years since quitting: 1.8  . Smokeless tobacco: Never Used  Substance Use Topics  . Alcohol use: No    Alcohol/week: 0.0 standard drinks    Frequency: Never    Comment: Occasional  . Drug use: No    No Known Allergies  Current Outpatient Medications  Medication Sig Dispense Refill  . acetaminophen (TYLENOL) 325 MG tablet Take 2 tablets (650 mg total) by mouth every 6 (six) hours as needed for mild pain.    [name] aspirin EC 81 MG tablet Take 81 mg by mouth daily.    . chlorhexidine (PERIDEX) 0.12 % solution Rinse with 15 mls twice daily for 30 seconds. Use after breakfast and at bedtime. Spit out excess. Do not swallow. 960 mL prn  . linagliptin (TRADJENTA) 5 MG TABS tablet Take 5 mg by mouth daily.    [name] lisinopril (PRINIVIL,ZESTRIL) 5 MG tablet Take 5 mg by mouth daily.  3  . metFORMIN (GLUCOPHAGE) 500 MG tablet Take 500 mg by mouth 2 (two) times daily with a meal.    . metoprolol tartrate (LOPRESSOR) 25 MG tablet Take 12.5 mg by mouth 2 (two) times daily.    . simvastatin (ZOCOR) 40 MG tablet Take 40 mg by mouth at bedtime.      . tamsulosin (FLOMAX) 0.4 MG CAPS capsule Take 0.4 mg by mouth daily as needed (frequent urination).     . traMADol (ULTRAM) 50 MG tablet Take 50 mg by mouth every 4-6 hours PRN moderate to severe pain. 30 tablet 0   No current facility-administered medications for this visit.      Physical Exam  Blood pressure 124/75, pulse 68, height 5\' 9"  (1.753 m), weight 167 lb (75.8 kg).  Constitutional: overall normal hygiene, normal nutrition, well developed, normal grooming, normal body habitus. Assistive device:none  Musculoskeletal: gait and station Limp none, muscle tone and strength are normal, no tremors or atrophy is present.  .  Neurological:  coordination overall normal.  Deep tendon reflex/nerve stretch intact.  Sensation normal.  Cranial nerves II-XII intact.   Skin:   Normal overall no scars, lesions, ulcers or rashes. No psoriasis.  Psychiatric: Alert and oriented x 3.  Recent memory intact, remote memory unclear.  Normal mood and affect. Well groomed.  Good eye contact.  Cardiovascular: overall no swelling, no varicosities, no edema bilaterally, normal temperatures of the legs and arms, no clubbing, cyanosis and good capillary refill.  Lymphatic: palpation is normal.  He has much better ROM of the right shoulder today with almost full overhead extension.  He has pain in the extremes.  NV intact.  He has no effusion and no redness.  All other systems reviewed and are negative   The patient has been educated about the nature of the problem(s) and counseled on treatment options.  The patient appeared to understand what I have discussed and is in agreement with it.  Encounter Diagnosis  Name Primary?  . Chronic right shoulder pain Yes    PLAN Call if any problems.  Precautions discussed.  Continue current medications.   Return to clinic to see Dr. Romeo Apple  Keep PT appointment.  Electronically Signed Darreld Mclean, MD 9/25/201910:10 AM

## 2017-10-13 ENCOUNTER — Telehealth: Payer: Self-pay | Admitting: Orthopaedic Surgery

## 2017-10-13 NOTE — Telephone Encounter (Signed)
This is a patient of Dr. Hilda Lias who needs a surgical consult with you.   Would you be willing to see this patient at 4:30 on Wednesday, October 2nd?    If not, would you look at your schedule and let me know when to put him on?  Thanks so much

## 2017-10-13 NOTE — Telephone Encounter (Signed)
ok 

## 2017-10-18 ENCOUNTER — Encounter: Payer: Self-pay | Admitting: Orthopedic Surgery

## 2017-10-18 ENCOUNTER — Ambulatory Visit (INDEPENDENT_AMBULATORY_CARE_PROVIDER_SITE_OTHER): Payer: Medicare Other | Admitting: Orthopedic Surgery

## 2017-10-18 VITALS — BP 164/91 | HR 65 | Ht 69.0 in | Wt 167.0 lb

## 2017-10-18 DIAGNOSIS — M25511 Pain in right shoulder: Secondary | ICD-10-CM

## 2017-10-18 DIAGNOSIS — G8929 Other chronic pain: Secondary | ICD-10-CM | POA: Diagnosis not present

## 2017-10-18 NOTE — Progress Notes (Signed)
Progress Note   Patient ID: [name], male   DOB: 1950/02/06, 67 y.o.   MRN: [id number]   Chief Complaint  Patient presents with  . Shoulder Pain    Right shoulder. Much better after injection    [name] comes in for possible surgery on his right shoulder.  He is already been given an injection is had an MRI he had physical therapy for right shoulder pain.  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  He status post open heart surgery in January he did well  However now he says that his shoulder feels fine he can move it well he is not having pain at night    Review of Systems  Respiratory: Negative for shortness of breath.   Cardiovascular: Negative for chest pain.     No Known Allergies   BP (!) 164/91   Pulse 65   Ht 5\' 9"  (1.753 m)   Wt 167 lb (75.8 kg)   BMI 24.66 kg/m   Physical Exam  Constitutional: He is oriented to person, place, and time. He appears well-developed and well-nourished.  Vital signs have been reviewed and are stable. Gen. appearance the patient is well-developed and well-nourished with normal grooming and hygiene.   Musculoskeletal:       Arms: Neurological: He is alert and oriented to person, place, and time.  Skin: Skin is warm and dry. No erythema.  Psychiatric: He has a normal mood and affect.  Vitals reviewed.    Medical decisions:   Data MRI Imaging:   IMPRESSION: 1. Partial-thickness articular surface tearing of the supraspinatus tendon with tendon delamination causing distal tendon thinning. There is moderate anterior supraspinatus tendinopathy. Also there are bony ossicles along the articular margin of the supraspinatus tendon posteriorly which may reflect free osteochondral fragments loose in the joint along the tendon, or possibly ossific tendinopathy. 2. Mild infraspinatus and biceps tendinopathy. 3. Severe degenerative glenohumeral  arthropathy and moderate degenerative AC joint arthropathy. Small pre acromial os acromiale. 4. Trace subacromial subdeltoid bursitis. 5. There is some deformity and irregularity of the humeral head which is probably from an old healed fracture, less likely from chronic degenerative subsidence. 6. Degenerative subcortical marrow edema in the glenoid especially inferiorly and posteriorly. 7. Degeneration of the posterior labrum.     Electronically Signed   By: [name] M.D.   On: 10/04/2017 16:44  MRI interpretation torn rotator cuff partial glenohumeral arthritis AC joint arthritis tendinopathy biceps and infraspinatus  Encounter Diagnosis  Name Primary?  . Chronic right shoulder pain Yes    PLAN:   The patient and I agree that at this time he does not need surgery as he is functioning well with little if any pain    Sean Canada, MD 10/18/2017 4:58 PM

## 2017-11-07 DIAGNOSIS — M25511 Pain in right shoulder: Secondary | ICD-10-CM | POA: Diagnosis not present

## 2017-11-10 ENCOUNTER — Emergency Department (HOSPITAL_COMMUNITY): Payer: Medicare Other

## 2017-11-10 ENCOUNTER — Observation Stay (HOSPITAL_COMMUNITY)
Admission: EM | Admit: 2017-11-10 | Discharge: 2017-11-13 | Disposition: A | Discharge status: Home / Self Care | Payer: Medicare Other | Attending: Internal Medicine | Admitting: Internal Medicine

## 2017-11-10 ENCOUNTER — Encounter (HOSPITAL_COMMUNITY): Payer: Self-pay | Admitting: *Deleted

## 2017-11-10 ENCOUNTER — Other Ambulatory Visit: Payer: Self-pay

## 2017-11-10 DIAGNOSIS — R7989 Other specified abnormal findings of blood chemistry: Secondary | ICD-10-CM | POA: Diagnosis not present

## 2017-11-10 DIAGNOSIS — I200 Unstable angina: Secondary | ICD-10-CM | POA: Diagnosis not present

## 2017-11-10 DIAGNOSIS — E875 Hyperkalemia: Secondary | ICD-10-CM | POA: Diagnosis not present

## 2017-11-10 DIAGNOSIS — Z87891 Personal history of nicotine dependence: Secondary | ICD-10-CM | POA: Insufficient documentation

## 2017-11-10 DIAGNOSIS — R0789 Other chest pain: Secondary | ICD-10-CM | POA: Diagnosis present

## 2017-11-10 DIAGNOSIS — Z952 Presence of prosthetic heart valve: Secondary | ICD-10-CM | POA: Diagnosis not present

## 2017-11-10 DIAGNOSIS — Z7984 Long term (current) use of oral hypoglycemic drugs: Secondary | ICD-10-CM | POA: Insufficient documentation

## 2017-11-10 DIAGNOSIS — E785 Hyperlipidemia, unspecified: Secondary | ICD-10-CM | POA: Insufficient documentation

## 2017-11-10 DIAGNOSIS — I959 Hypotension, unspecified: Secondary | ICD-10-CM | POA: Insufficient documentation

## 2017-11-10 DIAGNOSIS — Z955 Presence of coronary angioplasty implant and graft: Secondary | ICD-10-CM | POA: Diagnosis not present

## 2017-11-10 DIAGNOSIS — Z7982 Long term (current) use of aspirin: Secondary | ICD-10-CM | POA: Insufficient documentation

## 2017-11-10 DIAGNOSIS — I2510 Atherosclerotic heart disease of native coronary artery without angina pectoris: Secondary | ICD-10-CM | POA: Diagnosis not present

## 2017-11-10 DIAGNOSIS — E119 Type 2 diabetes mellitus without complications: Secondary | ICD-10-CM | POA: Diagnosis not present

## 2017-11-10 DIAGNOSIS — Z8249 Family history of ischemic heart disease and other diseases of the circulatory system: Secondary | ICD-10-CM | POA: Insufficient documentation

## 2017-11-10 DIAGNOSIS — I252 Old myocardial infarction: Secondary | ICD-10-CM | POA: Insufficient documentation

## 2017-11-10 DIAGNOSIS — R079 Chest pain, unspecified: Principal | ICD-10-CM | POA: Diagnosis present

## 2017-11-10 DIAGNOSIS — Z89612 Acquired absence of left leg above knee: Secondary | ICD-10-CM | POA: Insufficient documentation

## 2017-11-10 DIAGNOSIS — Z79899 Other long term (current) drug therapy: Secondary | ICD-10-CM | POA: Insufficient documentation

## 2017-11-10 DIAGNOSIS — I10 Essential (primary) hypertension: Secondary | ICD-10-CM | POA: Diagnosis not present

## 2017-11-10 DIAGNOSIS — I2582 Chronic total occlusion of coronary artery: Secondary | ICD-10-CM | POA: Insufficient documentation

## 2017-11-10 DIAGNOSIS — N400 Enlarged prostate without lower urinary tract symptoms: Secondary | ICD-10-CM | POA: Insufficient documentation

## 2017-11-10 DIAGNOSIS — Z79891 Long term (current) use of opiate analgesic: Secondary | ICD-10-CM | POA: Diagnosis not present

## 2017-11-10 DIAGNOSIS — R0989 Other specified symptoms and signs involving the circulatory and respiratory systems: Secondary | ICD-10-CM | POA: Diagnosis not present

## 2017-11-10 DIAGNOSIS — I700 Atherosclerosis of aorta: Secondary | ICD-10-CM | POA: Diagnosis not present

## 2017-11-10 LAB — COMPREHENSIVE METABOLIC PANEL
ALK PHOS: 62 U/L (ref 38–126)
ALT: 25 U/L (ref 0–44)
AST: 22 U/L (ref 15–41)
Albumin: 3.9 g/dL (ref 3.5–5.0)
Anion gap: 8 (ref 5–15)
BUN: 23 mg/dL (ref 8–23)
CALCIUM: 9.3 mg/dL (ref 8.9–10.3)
CO2: 23 mmol/L (ref 22–32)
CREATININE: 1.38 mg/dL — AB (ref 0.61–1.24)
Chloride: 103 mmol/L (ref 98–111)
GFR, EST AFRICAN AMERICAN: 60 mL/min — AB (ref >60)
GFR, EST NON AFRICAN AMERICAN: 52 mL/min — AB (ref >60)
Glucose, Bld: 278 mg/dL — ABNORMAL HIGH (ref 70–99)
Potassium: 5.4 mmol/L — ABNORMAL HIGH (ref 3.5–5.1)
Sodium: 134 mmol/L — ABNORMAL LOW (ref 135–145)
Total Bilirubin: 0.6 mg/dL (ref 0.3–1.2)
Total Protein: 7.1 g/dL (ref 6.5–8.1)

## 2017-11-10 LAB — CBC WITH DIFFERENTIAL/PLATELET
Abs Immature Granulocytes: 0.11 10*3/uL — ABNORMAL HIGH (ref 0.00–0.07)
Basophils Absolute: 0 10*3/uL (ref 0.0–0.1)
Basophils Relative: 0 %
EOS PCT: 0 %
Eosinophils Absolute: 0 10*3/uL (ref 0.0–0.5)
HEMATOCRIT: 46.9 % (ref 39.0–52.0)
Hemoglobin: 14.7 g/dL (ref 13.0–17.0)
Immature Granulocytes: 1 %
LYMPHS ABS: 2 10*3/uL (ref 0.7–4.0)
LYMPHS PCT: 14 %
MCH: 27.7 pg (ref 26.0–34.0)
MCHC: 31.3 g/dL (ref 30.0–36.0)
MCV: 88.5 fL (ref 80.0–100.0)
MONO ABS: 0.9 10*3/uL (ref 0.1–1.0)
MONOS PCT: 6 %
Neutro Abs: 11.2 10*3/uL — ABNORMAL HIGH (ref 1.7–7.7)
Neutrophils Relative %: 79 %
Platelets: 255 10*3/uL (ref 150–400)
RBC: 5.3 MIL/uL (ref 4.22–5.81)
RDW: 14.6 % (ref 11.5–15.5)
WBC: 14.2 10*3/uL — ABNORMAL HIGH (ref 4.0–10.5)
nRBC: 0 % (ref 0.0–0.2)

## 2017-11-10 LAB — TROPONIN I: Troponin I: <0.03 ng/mL (ref <0.03)

## 2017-11-10 LAB — LIPASE, BLOOD: LIPASE: 33 U/L (ref 11–51)

## 2017-11-10 LAB — D-DIMER, QUANTITATIVE: D-Dimer, Quant: 2.33 ug/mL-FEU — ABNORMAL HIGH (ref 0.00–0.50)

## 2017-11-10 MED ORDER — ASPIRIN 81 MG PO CHEW
324.0000 mg | CHEWABLE_TABLET | Freq: Once | ORAL | Status: AC
  Administered 2017-11-10: 324 mg via ORAL
  Filled 2017-11-10: qty 4

## 2017-11-10 MED ORDER — IOPAMIDOL (ISOVUE-370) INJECTION 76%: 100.0000 mL | Freq: Once | INTRAVENOUS | Status: DC | PRN

## 2017-11-10 MED ORDER — SODIUM CHLORIDE 0.9% FLUSH: 3.0000 mL | INTRAVENOUS | Status: DC | PRN

## 2017-11-10 MED ORDER — SODIUM CHLORIDE 0.9 % IV BOLUS
500.0000 mL | Freq: Once | INTRAVENOUS | Status: AC
  Administered 2017-11-10: 500 mL via INTRAVENOUS

## 2017-11-10 MED ORDER — SODIUM CHLORIDE 0.9 % IV SOLN: 250.0000 mL | INTRAVENOUS | Status: DC | PRN

## 2017-11-10 MED ORDER — IOPAMIDOL (ISOVUE-370) INJECTION 76%
80.0000 mL | Freq: Once | INTRAVENOUS | Status: AC | PRN
  Administered 2017-11-10: 80 mL via INTRAVENOUS

## 2017-11-10 MED ORDER — ACETAMINOPHEN 325 MG PO TABS: 650.0000 mg | ORAL_TABLET | Freq: Four times a day (QID) | ORAL | Status: DC | PRN

## 2017-11-10 MED ORDER — ACETAMINOPHEN 650 MG RE SUPP: 650.0000 mg | Freq: Four times a day (QID) | RECTAL | Status: DC | PRN

## 2017-11-10 MED ORDER — SODIUM CHLORIDE 0.9% FLUSH: 3.0000 mL | Freq: Two times a day (BID) | INTRAVENOUS | Status: DC

## 2017-11-10 MED ORDER — ENOXAPARIN SODIUM 40 MG/0.4ML ~~LOC~~ SOLN
40.0000 mg | SUBCUTANEOUS | Status: DC
  Administered 2017-11-11: 40 mg via SUBCUTANEOUS
  Filled 2017-11-10: qty 0.4

## 2017-11-10 NOTE — ED Provider Notes (Signed)
[facility] EMERGENCY DEPARTMENT Provider Note   CSN: [id number] Arrival date & time: 11/10/17  2104     History   Chief Complaint Chief Complaint  Patient presents with  . Chest Pain    HPI [name] is a 67 y.o. male.  HPI Patient with history of coronary artery disease presents with left-sided chest pain which she describes as sharp.  Walking from sleep last night.  States he took a nitroglycerin with improvement of the pain.  Patient states the pain return this evening around 5 PM.  Associated with diaphoresis and nausea.  He also was lightheaded and felt like he might pass out.  Took nitroglycerin with improvement of his symptoms.  He now complains of only very minor chest pain.  Rates it 1.5/10.  Denies shortness of breath.  No fever or chills.  No new lower extremity swelling or pain. Past Medical History:  Diagnosis Date  . Aortic stenosis   . Arthritis   . CAD (coronary artery disease)   . Diabetes mellitus    Type II  . Heart murmur   . Hyperlipidemia   . MI (myocardial infarction) (HCC)   . S/P AKA (above knee amputation) [facility])     Patient Active Problem List   Diagnosis Date Noted  . 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 Severe aortic stenosis   . Aortic stenosis 04/29/2013  . Aortic regurgitation 04/29/2013  . Murmur, cardiac 11/22/2010  . Hyperlipidemia 10/24/2008  . Coronary atherosclerosis 10/24/2008  . CAROTID BRUIT 10/24/2008  . DM 10/21/2008    Past Surgical History:  Procedure Laterality Date  . ABOVE KNEE LEG AMPUTATION Left 1972  . AORTIC VALVE REPLACEMENT N/A 02/06/2017   Procedure: AORTIC VALVE REPLACEMENT (AVR);  Surgeon: Alleen Borne, MD;  Location: [facility] OR;  Service: Open Heart Surgery;  Laterality: N/A;  . COLONOSCOPY    . CORONARY ANGIOPLASTY WITH STENT PLACEMENT    . LEG SURGERY     Repair of left leg trauma  . MULTIPLE EXTRACTIONS WITH ALVEOLOPLASTY N/A 11/29/2016   Procedure: Extraction of tooth #'s 4,09,81,19 and 32 with  alveoloplasty and gross debridement of remaining teeth;  Surgeon: Charlynne Pander, DDS;  Location: [facility] OR;  Service: Oral Surgery;  Laterality: N/A;  . RIGHT/LEFT HEART CATH AND CORONARY ANGIOGRAPHY N/A 11/14/2016   Procedure: RIGHT/LEFT HEART CATH AND CORONARY ANGIOGRAPHY;  Surgeon: Kathleene Hazel, MD;  Location: [facility] INVASIVE CV LAB;  Service: Cardiovascular;  Laterality: N/A;  . TEE WITHOUT CARDIOVERSION N/A 02/06/2017   Procedure: TRANSESOPHAGEAL ECHOCARDIOGRAM (TEE);  Surgeon: Alleen Borne, MD;  Location: [facility] OR;  Service: Open Heart Surgery;  Laterality: N/A;        Home Medications    Prior to Admission medications   Medication Sig Start Date End Date Taking? Authorizing Provider  acetaminophen (TYLENOL) 325 MG tablet Take 2 tablets (650 mg total) by mouth every 6 (six) hours as needed for mild pain. 02/13/17   Ardelle Balls, PA-C  aspirin EC 81 MG tablet Take 81 mg by mouth daily.    [provider]  chlorhexidine (PERIDEX) 0.12 % solution Rinse with 15 mls twice daily for 30 seconds. Use after breakfast and at bedtime. Spit out excess. Do not swallow. 12/13/16   Charlynne Pander, DDS  linagliptin (TRADJENTA) 5 MG TABS tablet Take 5 mg by mouth daily.    [provider]  lisinopril (PRINIVIL,ZESTRIL) 5 MG tablet Take 5 mg by mouth daily. 07/05/17   [provider]  metFORMIN (GLUCOPHAGE) 500 MG tablet Take 500 mg by mouth 2 (two) times daily with a meal.    [provider]  metoprolol tartrate (LOPRESSOR) 25 MG tablet Take 12.5 mg by mouth 2 (two) times daily.    [provider]  simvastatin (ZOCOR) 40 MG tablet Take 40 mg by mouth at bedtime.      [provider]  tamsulosin (FLOMAX) 0.4 MG CAPS capsule Take 0.4 mg by mouth daily as needed (frequent urination).     [provider]  traMADol (ULTRAM) 50 MG tablet Take 50 mg by mouth every 4-6 hours PRN moderate to severe pain. 02/13/17   Ardelle Balls, PA-C    Family History Family History  Problem Relation Age of Onset  . Hypertension Mother   . Heart attack Father     Social History Social History   Tobacco Use  . Smoking status: Former Smoker    Packs/day: 0.30    Years: 60.00    Pack years: 18.00    Types: Cigars, Cigarettes    Start date: 01/18/1956    Last attempt to quit: 11/25/2015    Years since quitting: 1.9  . Smokeless tobacco: Never Used  Substance Use Topics  . Alcohol use: No    Alcohol/week: 0.0 standard drinks    Frequency: Never    Comment: Occasional  . Drug use: No     Allergies   Patient has no known allergies.   Review of Systems Review of Systems  Constitutional: Positive for diaphoresis and fatigue. Negative for chills and fever.  HENT: Negative for trouble swallowing.   Eyes: Negative for visual disturbance.  Respiratory: Negative for cough and shortness of breath.   Cardiovascular: Positive for chest pain. Negative for palpitations and leg swelling.  Gastrointestinal: Positive for nausea. Negative for abdominal pain, constipation, diarrhea and vomiting.  Musculoskeletal: Negative for back pain, gait problem, myalgias and neck pain.  Neurological: Positive for light-headedness. Negative for syncope, weakness, numbness and headaches.  All other systems reviewed and are negative.    Physical Exam Updated Vital Signs BP 100/76   Pulse 89   Temp 97.8 F (36.6 C) (Oral)   Resp 17   Ht 5\' 9"  (1.753 m)   Wt 75.3 kg   SpO2 96%   BMI 24.51 kg/m   Physical Exam  Constitutional: He is oriented to person, place, and time. He appears well-developed and well-nourished. No distress.  HENT:  Head: Normocephalic and atraumatic.  Mouth/Throat: Oropharynx is clear and moist. No oropharyngeal exudate.  Eyes: Pupils are equal, round, and reactive to light. EOM are normal.  Neck: Normal range of motion. Neck supple. No JVD present.  Cardiovascular: Normal rate and regular rhythm. Exam reveals  no gallop and no friction rub.  No murmur heard. Pulmonary/Chest: Effort normal and breath sounds normal. No stridor. No respiratory distress. He has no wheezes.  Abdominal: Soft. Bowel sounds are normal. There is no tenderness. There is no rebound and no guarding.  Musculoskeletal: Normal range of motion. He exhibits no edema or tenderness.  Left below the knee amputation with prosthesis.  No right lower extremity swelling or tenderness to palpation.  Lymphadenopathy:    He has no cervical adenopathy.  Neurological: He is alert and oriented to person, place, and time.  Moving all extremities without focal deficit.  Sensation intact.  Skin: Skin is warm and dry. Capillary refill takes less than 2 seconds. No rash noted. He is not diaphoretic. No erythema.  Psychiatric:  He has a normal mood and affect. His behavior is normal.  Nursing note and vitals reviewed.    ED Treatments / Results  Labs (all labs ordered are listed, but only abnormal results are displayed) Labs Reviewed  CBC WITH DIFFERENTIAL/PLATELET - Abnormal; Notable for the following components:      Result Value   WBC 14.2 (*)    Neutro Abs 11.2 (*)    Abs Immature Granulocytes 0.11 (*)    All other components within normal limits  COMPREHENSIVE METABOLIC PANEL - Abnormal; Notable for the following components:   Sodium 134 (*)    Potassium 5.4 (*)    Glucose, Bld 278 (*)    Creatinine, Ser 1.38 (*)    GFR calc non Af Amer 52 (*)    GFR calc Af Amer 60 (*)    All other components within normal limits  D-DIMER, QUANTITATIVE (NOT AT [facility]) - Abnormal; Notable for the following components:   D-Dimer, Quant 2.33 (*)    All other components within normal limits  TROPONIN I  LIPASE, BLOOD    EKG EKG Interpretation  Date/Time:  Friday November 10 2017 21:15:27 EDT Ventricular Rate:  105 PR Interval:    QRS Duration: 96 QT Interval:  322 QTC Calculation: 426 R Axis:   29 Text Interpretation:  Sinus tachycardia LAE,  consider biatrial enlargement Confirmed by Loren Racer ([id number]) on 11/10/2017 9:34:27 PM   Radiology Dg Chest 2 View  Result Date: 11/10/2017 CLINICAL DATA:  67 y/o  M; left-sided chest pain. EXAM: CHEST - 2 VIEW COMPARISON:  03/22/2017 chest radiograph FINDINGS: Stable cardiac silhouette given projection and technique. Aortic valve replacement. Post median sternotomy with wires in alignment. Mild pulmonary vascular congestion. No consolidation, effusion, or pneumothorax. No acute osseous abnormality identified. IMPRESSION: Mild pulmonary vascular congestion.  No focal consolidation. Electronically Signed   By: Mitzi Hansen M.D.   On: 11/10/2017 22:33   Ct Angio Chest Pe W And/or Wo Contrast  Result Date: 11/10/2017 CLINICAL DATA:  Chest pain with positive D-dimer study EXAM: CT ANGIOGRAPHY CHEST WITH CONTRAST TECHNIQUE: Multidetector CT imaging of the chest was performed using the standard protocol during bolus administration of intravenous contrast. Multiplanar CT image reconstructions and MIPs were obtained to evaluate the vascular anatomy. CONTRAST:  80 mL ISOVUE-370 IOPAMIDOL (ISOVUE-370) INJECTION 76% COMPARISON:  Chest radiograph November 10, 2017 FINDINGS: Cardiovascular: There is no demonstrable pulmonary embolus. There is no thoracic aortic aneurysm or dissection. The visualized great vessels appear unremarkable. There are foci of aortic atherosclerosis. There are foci of coronary artery calcification. There is no appreciable pericardial effusion or pericardial thickening. Patient is status post aortic valve replacement. Mediastinum/Nodes: Thyroid appears unremarkable. There is no appreciable thoracic adenopathy. No esophageal lesions are evident. Lungs/Pleura: There is scattered fibrosis throughout the lungs bilaterally. There are occasional calcified granulomas in the right upper lobe. There are areas of patchy atelectasis in the lung bases. There is no frank edema or  consolidation. No pleural effusion or pleural thickening evident. Upper Abdomen: Visualized upper abdominal structures appear unremarkable. Musculoskeletal: Patient is status post median sternotomy. There are no blastic or lytic bone lesions. There are no chest wall lesions evident. Review of the MIP images confirms the above findings. IMPRESSION: 1. No demonstrable pulmonary embolus. No thoracic aortic aneurysm or dissection. There is aortic atherosclerosis. There are foci of coronary artery calcification. Patient is status post aortic valve replacement. 2. Areas of patchy fibrosis and atelectatic change. No consolidation or edema evident. 3.  No  evident thoracic adenopathy. Aortic Atherosclerosis (ICD10-I70.0). Electronically Signed   By: Bretta Bang III M.D.   On: 11/10/2017 23:15    Procedures Procedures (including critical care time)  Medications Ordered in ED Medications  sodium chloride 0.9 % bolus 500 mL (500 mLs Intravenous New Bag/Given 11/10/17 2329)  aspirin chewable tablet 324 mg (has no administration in time range)  sodium chloride 0.9 % bolus 500 mL (500 mLs Intravenous New Bag/Given 11/10/17 2200)  iopamidol (ISOVUE-370) 76 % injection 80 mL (80 mLs Intravenous Contrast Given 11/10/17 2256)     Initial Impression / Assessment and Plan / ED Course  I have reviewed the triage vital signs and the nursing notes.  Pertinent labs & imaging results that were available during my care of the patient were reviewed by me and considered in my medical decision making (see chart for details).      Patient is now chest pain-free.  Blood pressures improved with IV fluids.  Elevated d-dimer.  No evidence of PE on CT chest.  Initial troponin is normal.  Discussed with hospitalist will see patient in emergency department and admit. Final Clinical Impressions(s) / ED Diagnoses   Final diagnoses:  Unstable angina [facility])    ED Discharge Orders    None       Loren Racer,  MD 11/10/17 2333

## 2017-11-10 NOTE — ED Notes (Signed)
Pt hollaring out. Go in to check on pt and pt states he is having sharp pains to chest. edp aware. Comfort measures given.

## 2017-11-10 NOTE — ED Notes (Signed)
Pt taken to ct 

## 2017-11-10 NOTE — ED Notes (Signed)
Pt states he feels "good as new"

## 2017-11-10 NOTE — ED Triage Notes (Signed)
Chest pain onset last night, states he took nitro with some relief

## 2017-11-11 ENCOUNTER — Observation Stay (HOSPITAL_BASED_OUTPATIENT_CLINIC_OR_DEPARTMENT_OTHER): Payer: Medicare Other

## 2017-11-11 DIAGNOSIS — R079 Chest pain, unspecified: Secondary | ICD-10-CM

## 2017-11-11 DIAGNOSIS — Z955 Presence of coronary angioplasty implant and graft: Secondary | ICD-10-CM | POA: Diagnosis not present

## 2017-11-11 DIAGNOSIS — N400 Enlarged prostate without lower urinary tract symptoms: Secondary | ICD-10-CM | POA: Diagnosis not present

## 2017-11-11 DIAGNOSIS — I252 Old myocardial infarction: Secondary | ICD-10-CM | POA: Diagnosis not present

## 2017-11-11 DIAGNOSIS — I2582 Chronic total occlusion of coronary artery: Secondary | ICD-10-CM | POA: Diagnosis not present

## 2017-11-11 DIAGNOSIS — Z952 Presence of prosthetic heart valve: Secondary | ICD-10-CM | POA: Diagnosis not present

## 2017-11-11 DIAGNOSIS — Z79891 Long term (current) use of opiate analgesic: Secondary | ICD-10-CM | POA: Diagnosis not present

## 2017-11-11 DIAGNOSIS — E119 Type 2 diabetes mellitus without complications: Secondary | ICD-10-CM | POA: Diagnosis not present

## 2017-11-11 DIAGNOSIS — E875 Hyperkalemia: Secondary | ICD-10-CM | POA: Diagnosis present

## 2017-11-11 DIAGNOSIS — I5030 Unspecified diastolic (congestive) heart failure: Secondary | ICD-10-CM

## 2017-11-11 DIAGNOSIS — Z7982 Long term (current) use of aspirin: Secondary | ICD-10-CM | POA: Diagnosis not present

## 2017-11-11 DIAGNOSIS — I2510 Atherosclerotic heart disease of native coronary artery without angina pectoris: Secondary | ICD-10-CM | POA: Diagnosis not present

## 2017-11-11 DIAGNOSIS — Z7984 Long term (current) use of oral hypoglycemic drugs: Secondary | ICD-10-CM | POA: Diagnosis not present

## 2017-11-11 DIAGNOSIS — Z87891 Personal history of nicotine dependence: Secondary | ICD-10-CM | POA: Diagnosis not present

## 2017-11-11 DIAGNOSIS — Z89612 Acquired absence of left leg above knee: Secondary | ICD-10-CM | POA: Diagnosis not present

## 2017-11-11 DIAGNOSIS — Z79899 Other long term (current) drug therapy: Secondary | ICD-10-CM | POA: Diagnosis not present

## 2017-11-11 DIAGNOSIS — E785 Hyperlipidemia, unspecified: Secondary | ICD-10-CM | POA: Diagnosis not present

## 2017-11-11 DIAGNOSIS — Z8249 Family history of ischemic heart disease and other diseases of the circulatory system: Secondary | ICD-10-CM | POA: Diagnosis not present

## 2017-11-11 DIAGNOSIS — I10 Essential (primary) hypertension: Secondary | ICD-10-CM | POA: Diagnosis not present

## 2017-11-11 DIAGNOSIS — I959 Hypotension, unspecified: Secondary | ICD-10-CM | POA: Diagnosis not present

## 2017-11-11 LAB — COMPREHENSIVE METABOLIC PANEL
ALT: 21 U/L (ref 0–44)
ANION GAP: 7 (ref 5–15)
AST: 19 U/L (ref 15–41)
Albumin: 3.6 g/dL (ref 3.5–5.0)
Alkaline Phosphatase: 57 U/L (ref 38–126)
BUN: 19 mg/dL (ref 8–23)
CALCIUM: 8.7 mg/dL — AB (ref 8.9–10.3)
CO2: 23 mmol/L (ref 22–32)
Chloride: 107 mmol/L (ref 98–111)
Creatinine, Ser: 1.03 mg/dL (ref 0.61–1.24)
GFR calc non Af Amer: >60 mL/min (ref >60)
Glucose, Bld: 122 mg/dL — ABNORMAL HIGH (ref 70–99)
POTASSIUM: 4.1 mmol/L (ref 3.5–5.1)
SODIUM: 137 mmol/L (ref 135–145)
TOTAL PROTEIN: 6.5 g/dL (ref 6.5–8.1)
Total Bilirubin: 0.5 mg/dL (ref 0.3–1.2)

## 2017-11-11 LAB — CBC
HCT: 44.3 % (ref 39.0–52.0)
HEMOGLOBIN: 13.9 g/dL (ref 13.0–17.0)
MCH: 28 pg (ref 26.0–34.0)
MCHC: 31.4 g/dL (ref 30.0–36.0)
MCV: 89.1 fL (ref 80.0–100.0)
NRBC: 0 % (ref 0.0–0.2)
PLATELETS: 226 10*3/uL (ref 150–400)
RBC: 4.97 MIL/uL (ref 4.22–5.81)
RDW: 14.6 % (ref 11.5–15.5)
WBC: 10.8 10*3/uL — ABNORMAL HIGH (ref 4.0–10.5)

## 2017-11-11 LAB — TROPONIN I: Troponin I: <0.03 ng/mL (ref <0.03)

## 2017-11-11 LAB — ECHOCARDIOGRAM COMPLETE
Height: 69 in
Weight: 2656 oz

## 2017-11-11 LAB — LIPID PANEL
Cholesterol: 125 mg/dL (ref 0–200)
HDL: 28 mg/dL — ABNORMAL LOW (ref >40)
LDL CALC: 64 mg/dL (ref 0–99)
Total CHOL/HDL Ratio: 4.5 RATIO
Triglycerides: 166 mg/dL — ABNORMAL HIGH (ref <150)
VLDL: 33 mg/dL (ref 0–40)

## 2017-11-11 LAB — CBG MONITORING, ED
GLUCOSE-CAPILLARY: 127 mg/dL — AB (ref 70–99)
GLUCOSE-CAPILLARY: 129 mg/dL — AB (ref 70–99)
Glucose-Capillary: 151 mg/dL — ABNORMAL HIGH (ref 70–99)
Glucose-Capillary: 136 mg/dL — ABNORMAL HIGH (ref 70–99)
Glucose-Capillary: 232 mg/dL — ABNORMAL HIGH (ref 70–99)

## 2017-11-11 LAB — GLUCOSE, CAPILLARY: Glucose-Capillary: 134 mg/dL — ABNORMAL HIGH (ref 70–99)

## 2017-11-11 MED ORDER — METOPROLOL TARTRATE 12.5 MG HALF TABLET
12.5000 mg | ORAL_TABLET | Freq: Two times a day (BID) | ORAL | Status: DC
  Administered 2017-11-11 – 2017-11-13 (×4): 12.5 mg via ORAL
  Filled 2017-11-11 (×4): qty 1

## 2017-11-11 MED ORDER — TAMSULOSIN HCL 0.4 MG PO CAPS: 0.4000 mg | ORAL_CAPSULE | Freq: Every day | ORAL | Status: DC | PRN

## 2017-11-11 MED ORDER — INSULIN ASPART 100 UNIT/ML ~~LOC~~ SOLN
0.0000 [IU] | SUBCUTANEOUS | Status: DC
  Administered 2017-11-11 (×2): 1 [IU] via SUBCUTANEOUS
  Administered 2017-11-11: 2 [IU] via SUBCUTANEOUS
  Administered 2017-11-11: 1 [IU] via SUBCUTANEOUS
  Administered 2017-11-11: 3 [IU] via SUBCUTANEOUS
  Filled 2017-11-11 (×2): qty 1

## 2017-11-11 MED ORDER — VANCOMYCIN HCL IN DEXTROSE 1-5 GM/200ML-% IV SOLN
INTRAVENOUS | Status: AC
  Filled 2017-11-11: qty 200

## 2017-11-11 MED ORDER — INSULIN ASPART 100 UNIT/ML ~~LOC~~ SOLN: 0.0000 [IU] | Freq: Three times a day (TID) | SUBCUTANEOUS | Status: DC

## 2017-11-11 MED ORDER — SODIUM POLYSTYRENE SULFONATE 15 GM/60ML PO SUSP
15.0000 g | Freq: Once | ORAL | Status: AC
  Administered 2017-11-11: 15 g via ORAL
  Filled 2017-11-11: qty 60

## 2017-11-11 MED ORDER — INSULIN ASPART 100 UNIT/ML ~~LOC~~ SOLN: 0.0000 [IU] | Freq: Every day | SUBCUTANEOUS | Status: DC

## 2017-11-11 MED ORDER — ACETAMINOPHEN 325 MG PO TABS: 650.0000 mg | ORAL_TABLET | Freq: Four times a day (QID) | ORAL | Status: DC | PRN

## 2017-11-11 MED ORDER — SIMVASTATIN 40 MG PO TABS
40.0000 mg | ORAL_TABLET | Freq: Every day | ORAL | Status: DC
  Administered 2017-11-11 – 2017-11-12 (×2): 40 mg via ORAL
  Filled 2017-11-11 (×2): qty 1

## 2017-11-11 MED ORDER — LISINOPRIL 5 MG PO TABS
5.0000 mg | ORAL_TABLET | Freq: Every day | ORAL | Status: DC
  Administered 2017-11-11 – 2017-11-13 (×3): 5 mg via ORAL
  Filled 2017-11-11 (×3): qty 1

## 2017-11-11 MED ORDER — ASPIRIN EC 81 MG PO TBEC
81.0000 mg | DELAYED_RELEASE_TABLET | Freq: Every day | ORAL | Status: DC
  Administered 2017-11-11 – 2017-11-13 (×3): 81 mg via ORAL
  Filled 2017-11-11 (×3): qty 1

## 2017-11-11 NOTE — H&P (Addendum)
TRH H&P   Patient Demographics:    [name], is a 67 y.o. male  MRN: [id number]   DOB - Feb 21, 1950  Admit Date - 11/10/2017  Outpatient Primary MD for the patient is Carylon Perches, MD  Referring MD/NP/PA: Loren Racer  Outpatient Specialists:    Patient coming from: home  Chief Complaint  Patient presents with  . Chest Pain      HPI:    [name]  is a 67 y.o. male, w hypertension, hyperlipidemia, Dm2, h/o aortic stenosis s/p AVR, CAD s/p MI, apparently c/o chest pain , left sided, tightness starting last nite,  Relieved by nitro after 10 minutes last nite.  Pt had further chest pain at 5 pm at rest, without radiation and this lasted about 20 minutes associated with diaphoresis and nausea and therefore presented to ED for evaluation.   In ED  T 97.8  P 89-104  Bp 85/63,  Currently 110/89 after IVF in ED.  CTA chest IMPRESSION: 1. No demonstrable pulmonary embolus. No thoracic aortic aneurysm or dissection. There is aortic atherosclerosis. There are foci of coronary artery calcification. Patient is status post aortic valve replacement.  2. Areas of patchy fibrosis and atelectatic change. No consolidation or edema evident.  3.  No evident thoracic adenopathy.  Aortic Atherosclerosis (ICD10-I70.0).   Wbc 14.2, Hgb 14.7, Plt 255  Na 134, K 5.4 Bun 23, Creatinine 1.38 Ast 22, Alt 25  Lipase 33  Trop <0.03  EKG ST at 105, nl axis, t inversion in avl,  No st- t changes c/w ischemia  Pt will be admitted for w/up of chest pain    Review of systems:    In addition to the HPI above, No Fever-chills, No Headache, No changes with Vision or hearing, No problems swallowing food or Liquids, No Cough or Shortness of Breath, No Abdominal pain, No Nausea or Vommitting, Bowel movements are regular, No Blood in stool or Urine, No dysuria, No new skin  rashes or bruises, No new joints pains-aches,  No new weakness, tingling, numbness in any extremity, No recent weight gain or loss, No polyuria, polydypsia or polyphagia, No significant Mental Stressors.  A full 10 point Review of Systems was done, except as stated above, all other Review of Systems were negative.   With Past History of the following :    Past Medical History:  Diagnosis Date  . Aortic stenosis   . Arthritis   . CAD (coronary artery disease)   . Diabetes mellitus    Type II  . Heart murmur   . Hyperlipidemia   . MI (myocardial infarction) (HCC)   . S/P AKA (above knee amputation) (HCC)       Past Surgical History:  Procedure Laterality Date  . ABOVE KNEE LEG AMPUTATION Left 1972  . AORTIC VALVE REPLACEMENT N/A 02/06/2017   Procedure: AORTIC VALVE REPLACEMENT (AVR);  Surgeon:  Alleen Borne, MD;  Location: [facility] OR;  Service: Open Heart Surgery;  Laterality: N/A;  . COLONOSCOPY    . CORONARY ANGIOPLASTY WITH STENT PLACEMENT    . LEG SURGERY     Repair of left leg trauma  . MULTIPLE EXTRACTIONS WITH ALVEOLOPLASTY N/A 11/29/2016   Procedure: Extraction of tooth #'s 1,61,09,60 and 32 with alveoloplasty and gross debridement of remaining teeth;  Surgeon: Charlynne Pander, DDS;  Location: [facility] OR;  Service: Oral Surgery;  Laterality: N/A;  . RIGHT/LEFT HEART CATH AND CORONARY ANGIOGRAPHY N/A 11/14/2016   Procedure: RIGHT/LEFT HEART CATH AND CORONARY ANGIOGRAPHY;  Surgeon: Kathleene Hazel, MD;  Location: [facility] INVASIVE CV LAB;  Service: Cardiovascular;  Laterality: N/A;  . TEE WITHOUT CARDIOVERSION N/A 02/06/2017   Procedure: TRANSESOPHAGEAL ECHOCARDIOGRAM (TEE);  Surgeon: Alleen Borne, MD;  Location: [facility] OR;  Service: Open Heart Surgery;  Laterality: N/A;      Social History:     Social History   Tobacco Use  . Smoking status: Former Smoker    Packs/day: 0.30    Years: 60.00    Pack years: 18.00    Types: Cigars, Cigarettes    Start date: 01/18/1956      Last attempt to quit: 11/25/2015    Years since quitting: 1.9  . Smokeless tobacco: Never Used  Substance Use Topics  . Alcohol use: No    Alcohol/week: 0.0 standard drinks    Frequency: Never    Comment: Occasional     Lives - at home  Mobility - walks by self   Family History :     Family History  Problem Relation Age of Onset  . Hypertension Mother   . Heart attack Father       Home Medications:   Prior to Admission medications   Medication Sig Start Date End Date Taking? Authorizing Provider  acetaminophen (TYLENOL) 325 MG tablet Take 2 tablets (650 mg total) by mouth every 6 (six) hours as needed for mild pain. 02/13/17   Ardelle Balls, PA-C  aspirin EC 81 MG tablet Take 81 mg by mouth daily.    [provider]  chlorhexidine (PERIDEX) 0.12 % solution Rinse with 15 mls twice daily for 30 seconds. Use after breakfast and at bedtime. Spit out excess. Do not swallow. 12/13/16   Charlynne Pander, DDS  linagliptin (TRADJENTA) 5 MG TABS tablet Take 5 mg by mouth daily.    [provider]  lisinopril (PRINIVIL,ZESTRIL) 5 MG tablet Take 5 mg by mouth daily. 07/05/17   [provider]  metFORMIN (GLUCOPHAGE) 500 MG tablet Take 500 mg by mouth 2 (two) times daily with a meal.    [provider]  metoprolol tartrate (LOPRESSOR) 25 MG tablet Take 12.5 mg by mouth 2 (two) times daily.    [provider]  simvastatin (ZOCOR) 40 MG tablet Take 40 mg by mouth at bedtime.      [provider]  tamsulosin (FLOMAX) 0.4 MG CAPS capsule Take 0.4 mg by mouth daily as needed (frequent urination).     [provider]  traMADol (ULTRAM) 50 MG tablet Take 50 mg by mouth every 4-6 hours PRN moderate to severe pain. 02/13/17   Ardelle Balls, PA-C     Allergies:    No Known Allergies   Physical Exam:   Vitals  Blood pressure 100/76, pulse 89, temperature 97.8 F (36.6 C), temperature source Oral, resp. rate 17,  height 5\' 9"  (1.753 m), weight 75.3 kg,  SpO2 96 %.   1. General  lying in bed in NAD,   2. Normal affect and insight, Not Suicidal or Homicidal, Awake Alert, Oriented X 3.  3. No F.N deficits, ALL C.Nerves Intact, Strength 5/5 all 4 extremities, Sensation intact all 4 extremities, Plantars down going.  4. Ears and Eyes appear Normal, Conjunctivae clear, PERRLA. Moist Oral Mucosa.  5. Supple Neck, No JVD, No cervical lymphadenopathy appriciated, No Carotid Bruits.  6. Symmetrical Chest wall movement, Good air movement bilaterally, CTAB.  7. Midline scar, RRR, No Gallops, Rubs or Murmurs, No Parasternal Heave.  8. Positive Bowel Sounds, Abdomen Soft, No tenderness, No organomegaly appriciated,No rebound -guarding or rigidity.  9.  No Cyanosis, Normal Skin Turgor, No Skin Rash or Bruise.  10. Good muscle tone,  joints appear normal , no effusions, Normal ROM.  11. No Palpable Lymph Nodes in Neck or Axillae L aka    Data Review:    CBC Recent Labs  Lab 11/10/17 2123  WBC 14.2*  HGB 14.7  HCT 46.9  PLT 255  MCV 88.5  MCH 27.7  MCHC 31.3  RDW 14.6  LYMPHSABS 2.0  MONOABS 0.9  EOSABS 0.0  BASOSABS 0.0   ------------------------------------------------------------------------------------------------------------------  Chemistries  Recent Labs  Lab 11/10/17 2123  NA 134*  K 5.4*  CL 103  CO2 23  GLUCOSE 278*  BUN 23  CREATININE 1.38*  CALCIUM 9.3  AST 22  ALT 25  ALKPHOS 62  BILITOT 0.6   ------------------------------------------------------------------------------------------------------------------ estimated creatinine clearance is 52.7 mL/min (A) (by C-G formula based on SCr of 1.38 mg/dL (H)). ------------------------------------------------------------------------------------------------------------------ No results for input(s): TSH, T4TOTAL, T3FREE, THYROIDAB in the last 72 hours.  Invalid input(s): FREET3  Coagulation profile No results for  input(s): INR, PROTIME in the last 168 hours. ------------------------------------------------------------------------------------------------------------------- Recent Labs    11/10/17 2123  DDIMER 2.33*   -------------------------------------------------------------------------------------------------------------------  Cardiac Enzymes Recent Labs  Lab 11/10/17 2123  TROPONINI <0.03   ------------------------------------------------------------------------------------------------------------------ No results found for: BNP   ---------------------------------------------------------------------------------------------------------------  Urinalysis    Component Value Date/Time   COLORURINE YELLOW 02/02/2017 1124   APPEARANCEUR CLEAR 02/02/2017 1124   LABSPEC 1.020 02/02/2017 1124   PHURINE 5.0 02/02/2017 1124   GLUCOSEU >=500 (A) 02/02/2017 1124   GLUCOSEU neg 02/03/2009 1250   HGBUR NEGATIVE 02/02/2017 1124   BILIRUBINUR NEGATIVE 02/02/2017 1124   KETONESUR NEGATIVE 02/02/2017 1124   PROTEINUR NEGATIVE 02/02/2017 1124   NITRITE NEGATIVE 02/02/2017 1124   LEUKOCYTESUR NEGATIVE 02/02/2017 1124    ----------------------------------------------------------------------------------------------------------------   Imaging Results:    Dg Chest 2 View  Result Date: 11/10/2017 CLINICAL DATA:  67 y/o  M; left-sided chest pain. EXAM: CHEST - 2 VIEW COMPARISON:  03/22/2017 chest radiograph FINDINGS: Stable cardiac silhouette given projection and technique. Aortic valve replacement. Post median sternotomy with wires in alignment. Mild pulmonary vascular congestion. No consolidation, effusion, or pneumothorax. No acute osseous abnormality identified. IMPRESSION: Mild pulmonary vascular congestion.  No focal consolidation. Electronically Signed   By: Mitzi Hansen M.D.   On: 11/10/2017 22:33   Ct Angio Chest Pe W And/or Wo Contrast  Result Date: 11/10/2017 CLINICAL DATA:   Chest pain with positive D-dimer study EXAM: CT ANGIOGRAPHY CHEST WITH CONTRAST TECHNIQUE: Multidetector CT imaging of the chest was performed using the standard protocol during bolus administration of intravenous contrast. Multiplanar CT image reconstructions and MIPs were obtained to evaluate the vascular anatomy. CONTRAST:  80 mL ISOVUE-370 IOPAMIDOL (ISOVUE-370) INJECTION 76% COMPARISON:  Chest radiograph November 10, 2017 FINDINGS: Cardiovascular: There  is no demonstrable pulmonary embolus. There is no thoracic aortic aneurysm or dissection. The visualized great vessels appear unremarkable. There are foci of aortic atherosclerosis. There are foci of coronary artery calcification. There is no appreciable pericardial effusion or pericardial thickening. Patient is status post aortic valve replacement. Mediastinum/Nodes: Thyroid appears unremarkable. There is no appreciable thoracic adenopathy. No esophageal lesions are evident. Lungs/Pleura: There is scattered fibrosis throughout the lungs bilaterally. There are occasional calcified granulomas in the right upper lobe. There are areas of patchy atelectasis in the lung bases. There is no frank edema or consolidation. No pleural effusion or pleural thickening evident. Upper Abdomen: Visualized upper abdominal structures appear unremarkable. Musculoskeletal: Patient is status post median sternotomy. There are no blastic or lytic bone lesions. There are no chest wall lesions evident. Review of the MIP images confirms the above findings. IMPRESSION: 1. No demonstrable pulmonary embolus. No thoracic aortic aneurysm or dissection. There is aortic atherosclerosis. There are foci of coronary artery calcification. Patient is status post aortic valve replacement. 2. Areas of patchy fibrosis and atelectatic change. No consolidation or edema evident. 3.  No evident thoracic adenopathy. Aortic Atherosclerosis (ICD10-I70.0). Electronically Signed   By: Bretta Bang III M.D.    On: 11/10/2017 23:15       Assessment & Plan:    Active Problems:   Diabetes mellitus without complication (HCC)   Coronary atherosclerosis   Chest pain    Chest pain  Tele Trop I q6h x3 Check lipid Cardiac echo NPO after [facility] consult requested by email  CAD  Cont Lisinopril 5mg  po qday Cont metoprolol 12.5mg  po bid Cont Simvastatin 40mg  po qhs Cont Aspirin 81mg  po qday  Hypotension Pt received iv fluid in ED, with improvement,  Continue to monitor, holding parameters placed on bp medications.   Hyperkalemia Kayexalate  Check cmp in am  Dm2 HOLD Metformin/ Tradjenta fsbs q4h, ISS  Bph Cont Flomax 0.4mg  po qhs   DVT Prophylaxis Lovenox - SCDs  AM Labs Ordered, also please review Full Orders  Family Communication: Admission, patients condition and plan of care including tests being ordered have been discussed with the patient  who indicate understanding and agree with the plan and Code Status.  Code Status  FULL CODE  Likely DC to  home  Condition GUARDED    Consults called: cardiology consult placed by email, appreciate input  Admission status: observation, pt requires hospital admission for evaluation of chest pain, depending upon results of w/up, may require inpatient status  Time spent in minutes : 70   Pearson Grippe M.D on 11/11/2017 at 12:07 AM  Between 7am to 7pm - Pager - [phone]   After 7pm go to [email or web address] - password [facility]  [facility] - Office  [phone]

## 2017-11-11 NOTE — ED Notes (Signed)
Heart Healthy Diet approved by Dr. Sherryll Burger. Pt given lunch meal tray.

## 2017-11-11 NOTE — Progress Notes (Signed)
[place] TEAM 1 - Stepdown/ICU TEAM  CONSUELO THAYNE  ZOX:[id number] DOB: [birth year] DOA: 11/10/2017 PCP: Carylon Perches, MD    Brief Narrative:  67yo male w/ a hx of BMS placement to mid circumflex in 2008 status post MI, aortic stenosis status post AoVR 01/2017, hypertension, dyslipidemia, type 2 diabetes, and BPH who presented to the [facility] ED with L substernal chest tightness and pain reminiscent of his prior MI, occurring when at rest, and resolving w/ use of nitroglycerin.  In the ED he was given full dose aspirin and had a CT PE study that was negative for pulmonary embolus.  Two-view chest x-ray with no acute findings.  EKG with sinus tachycardia at 105 bpm and no significant ST/T wave changes noted.  Troponins x3 sets have been less than 0.03.   Significant Events: 10/25 presented to [facility] ED w/ chest pain 10/26 admit by [facility] - transfer to [facility]   Subjective: Pt is seen for a f/u visit.    Assessment & Plan:  Chest pain - hx of CAD  S/P AoVR Jan 2019 23 mm Edwards Magna-Ease pericardialvalve by Dr. Laneta Simmers on 02/06/2017  DM2   HLD  BPH  DVT prophylaxis: lovenox  Code Status: FULL CODE Family Communication:  Disposition Plan:   Consultants:  [facility]   Antimicrobials:  none   Objective: Blood pressure (!) 130/94, pulse (!) 109, temperature 97.7 F (36.5 C), temperature source Oral, resp. rate 18, height 5\' 9"  (1.753 m), weight 75.3 kg, SpO2 94 %.  Intake/Output Summary (Last 24 hours) at 11/11/2017 1809 Last data filed at 11/11/2017 0708 Gross per 24 hour  Intake 500 ml  Output 450 ml  Net 50 ml   Filed Weights   11/10/17 2107  Weight: 75.3 kg    Examination: Pt was seen for a f/u visit.    CBC: Recent Labs  Lab 11/10/17 2123 11/11/17 0552  WBC 14.2* 10.8*  NEUTROABS 11.2*  --   HGB 14.7 13.9  HCT 46.9 44.3  MCV 88.5 89.1  PLT 255 226   Basic Metabolic Panel: Recent Labs  Lab 11/10/17 2123 11/11/17 0552  NA 134* 137  K 5.4* 4.1  CL  103 107  CO2 23 23  GLUCOSE 278* 122*  BUN 23 19  CREATININE 1.38* 1.03  CALCIUM 9.3 8.7*   GFR: Estimated Creatinine Clearance: 70.5 mL/min (by C-G formula based on SCr of 1.03 mg/dL).  Liver Function Tests: Recent Labs  Lab 11/10/17 2123 11/11/17 0552  AST 22 19  ALT 25 21  ALKPHOS 62 57  BILITOT 0.6 0.5  PROT 7.1 6.5  ALBUMIN 3.9 3.6   Recent Labs  Lab 11/10/17 2123  LIPASE 33    Cardiac Enzymes: Recent Labs  Lab 11/10/17 2123 11/10/17 2349 11/11/17 0552 11/11/17 1125  TROPONINI <0.03 <0.03 <0.03 <0.03    HbA1C: Hgb A1c MFr Bld  Date/Time Value Ref Range Status  02/02/2017 11:23 AM 7.9 (H) 4.8 - 5.6 % Final    Comment:    (NOTE) Pre diabetes:          5.7%-6.4% Diabetes:              >6.4% Glycemic control for   <7.0% adults with diabetes   11/29/2016 08:13 AM 6.6 (H) 4.8 - 5.6 % Final    Comment:    (NOTE) Pre diabetes:          5.7%-6.4% Diabetes:              >  6.4% Glycemic control for   <7.0% adults with diabetes     CBG: Recent Labs  Lab 11/11/17 0108 11/11/17 0420 11/11/17 0807 11/11/17 1304 11/11/17 1657  GLUCAP 151* 127* 129* 136* 232*    Scheduled Meds: . enoxaparin (LOVENOX) injection  40 mg Subcutaneous Q24H  . insulin aspart  0-9 Units Subcutaneous Q4H  . sodium chloride flush  3 mL Intravenous Q12H     LOS: 0 days   Time spent: No Charge  Lonia Blood, MD [facility] Office  [phone] Pager - Text Page per Amion as per below:  On-Call/Text Page:      [email or web address]  If 7PM-7AM, please contact night-coverage [email or web address] 11/11/2017, 6:09 PM

## 2017-11-11 NOTE — Progress Notes (Signed)
*  PRELIMINARY RESULTS* Echocardiogram 2D Echocardiogram has been performed.  Stacey Drain 11/11/2017, 10:59 AM

## 2017-11-11 NOTE — Progress Notes (Signed)
[name] is a 67 year old African-American male with history of bare-metal stent placement to mid circumflex in 2008 status post MI, history of aortic stenosis status post AVR on 01/2017, hypertension, dyslipidemia, type 2 diabetes, and BPH who presented to the ED with complaints of left substernal chest tightness and pain reminiscent of his prior MI.  He states that this began initially 2 nights ago at which point he took some nitroglycerin and experienced immediate relief, approximately within 5 to 10 minutes.  He states he then had a recurrence of the same chest pain at approximately 5 PM last night that lasted 20 minutes and was once again improved with nitroglycerin.  This was associated with some diaphoresis and nausea and this prompted him to come to the ED for evaluation.  By the time he arrived to the ED, he only had minimal chest pain and rated at 1.5/10.  In the ED, he was given some IV fluid and full dose aspirin and had a CT PE study that was negative for pulmonary embolus.  Two-view chest x-ray with no acute findings.  EKG with sinus tachycardia at 105 bpm and no significant ST/T wave changes noted.  Troponins x3 sets have been less than 0.03.  He currently claims to be chest pain-free.  He is currently awaiting a stepdown unit bed at [facility] for further evaluation per cardiology due to concerns for unstable angina.

## 2017-11-11 NOTE — ED Notes (Signed)
Called [facility] with bed assign. And for transport to [facility]

## 2017-11-12 DIAGNOSIS — I259 Chronic ischemic heart disease, unspecified: Secondary | ICD-10-CM | POA: Diagnosis not present

## 2017-11-12 DIAGNOSIS — I2510 Atherosclerotic heart disease of native coronary artery without angina pectoris: Secondary | ICD-10-CM | POA: Diagnosis not present

## 2017-11-12 DIAGNOSIS — E119 Type 2 diabetes mellitus without complications: Secondary | ICD-10-CM | POA: Diagnosis not present

## 2017-11-12 DIAGNOSIS — R079 Chest pain, unspecified: Secondary | ICD-10-CM | POA: Diagnosis not present

## 2017-11-12 DIAGNOSIS — I959 Hypotension, unspecified: Secondary | ICD-10-CM | POA: Diagnosis not present

## 2017-11-12 DIAGNOSIS — R0789 Other chest pain: Secondary | ICD-10-CM | POA: Diagnosis not present

## 2017-11-12 DIAGNOSIS — I2582 Chronic total occlusion of coronary artery: Secondary | ICD-10-CM | POA: Diagnosis not present

## 2017-11-12 DIAGNOSIS — E875 Hyperkalemia: Secondary | ICD-10-CM | POA: Diagnosis not present

## 2017-11-12 LAB — GLUCOSE, CAPILLARY
GLUCOSE-CAPILLARY: 138 mg/dL — AB (ref 70–99)
GLUCOSE-CAPILLARY: 153 mg/dL — AB (ref 70–99)
GLUCOSE-CAPILLARY: 169 mg/dL — AB (ref 70–99)

## 2017-11-12 MED ORDER — HYOSCYAMINE SULFATE 0.125 MG SL SUBL
0.2500 mg | SUBLINGUAL_TABLET | Freq: Once | SUBLINGUAL | Status: AC
  Administered 2017-11-12: 0.25 mg via SUBLINGUAL
  Filled 2017-11-12: qty 2

## 2017-11-12 MED ORDER — LINAGLIPTIN 5 MG PO TABS: 5.0000 mg | ORAL_TABLET | Freq: Every day | ORAL | Status: DC

## 2017-11-12 MED ORDER — LINAGLIPTIN 5 MG PO TABS
5.0000 mg | ORAL_TABLET | Freq: Every day | ORAL | Status: DC
  Administered 2017-11-13: 5 mg via ORAL
  Filled 2017-11-12: qty 1

## 2017-11-12 MED ORDER — HYOSCYAMINE SULFATE 0.125 MG PO TABS
0.2500 mg | ORAL_TABLET | Freq: Once | ORAL | Status: DC
  Filled 2017-11-12 (×2): qty 2

## 2017-11-12 MED ORDER — ALUM & MAG HYDROXIDE-SIMETH 200-200-20 MG/5ML PO SUSP
30.0000 mL | ORAL | Status: DC | PRN
  Administered 2017-11-12: 30 mL via ORAL
  Filled 2017-11-12: qty 30

## 2017-11-12 MED ORDER — SIMETHICONE 40 MG/0.6ML PO SUSP
80.0000 mg | Freq: Four times a day (QID) | ORAL | Status: DC
  Administered 2017-11-12 (×2): 80 mg via ORAL
  Filled 2017-11-12 (×4): qty 1.2

## 2017-11-12 NOTE — Progress Notes (Signed)
Pt concerned about going home. Pt states he has no chest pain.  Pt had episode of severe abdominal pain after eating lettuce for lunch. Pt states this is "what happened before when I ate salad". Maalox given and MD called. Pt given mylicon drops to help.  Pt stated he felt better after taking maalox and passing gas.Thomas Hoff, RN
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
## 2017-11-12 NOTE — Progress Notes (Signed)
[place] TEAM 1 - Stepdown/ICU TEAM  [name]  GNF:[id number] DOB: 15-Nov-1950 DOA: 11/10/2017 PCP: Carylon Perches, MD    Brief Narrative:  67yo male w/ a hx of BMS placement to mid circumflex in 2008 status post MI, aortic stenosis status post AoVR 01/2017, hypertension, dyslipidemia, type 2 diabetes, and BPH who presented to the [facility] ED with L substernal chest tightness and pain reminiscent of his prior MI, occurring when at rest, and resolving w/ use of nitroglycerin.  In the ED he was given full dose aspirin and had a CT PE study that was negative for pulmonary embolus.  Two-view chest x-ray with no acute findings.  EKG with sinus tachycardia at 105 bpm and no significant ST/T wave changes noted.  Troponins x3 sets have been less than 0.03.   Significant Events: 10/25 presented to [facility] ED w/ chest pain 10/26 admit by [facility] - transfer to [facility]   Subjective: Pt was seen by Cardiology today. He continues to have some intermittent pain, but it is now clearly more focused in his epigastrium and stomach. No chest wall or substernal pain. No sob.   Assessment & Plan:  Chest pain - hx of CAD Sx not convincing for a cardiac source - monitor over night - if remains stable plan is for d/c home in AM w/ outpt stress testing   S/P AoVR Jan 2019 23 mm Edwards Magna-Ease pericardialvalve by Dr. Laneta Simmers on 02/06/2017 - clinically stable   DM2  CBG controlled   HLD Cont Zocor  BPH Cont Flomax  DVT prophylaxis: lovenox  Code Status: FULL CODE Family Communication: no family present  Disposition Plan: anticipate d/c home in AM   Consultants:  [facility]   Antimicrobials:  none   Objective: Blood pressure (!) 121/95, pulse 85, temperature 97.9 F (36.6 C), temperature source Oral, resp. rate (!) 24, height 5\' 9"  (1.753 m), weight 70 kg, SpO2 95 %.  Intake/Output Summary (Last 24 hours) at 11/12/2017 1501 Last data filed at 11/12/2017 1419 Gross per 24 hour  Intake 240 ml    Output 1950 ml  Net -1710 ml   Filed Weights   11/11/17 1824 11/11/17 1826 11/12/17 0626  Weight: 72.6 kg 70 kg 70 kg    Examination: General: No acute respiratory distress Lungs: Clear to auscultation bilaterally without wheezes or crackles Cardiovascular: Regular rate and rhythm without murmur gallop or rub normal S1 and S2 Abdomen: Nontender, nondistended, soft, bowel sounds positive, no rebound, no ascites, no appreciable mass Extremities: No significant cyanosis, clubbing, or edema bilateral lower extremities    CBC: Recent Labs  Lab 11/10/17 2123 11/11/17 0552  WBC 14.2* 10.8*  NEUTROABS 11.2*  --   HGB 14.7 13.9  HCT 46.9 44.3  MCV 88.5 89.1  PLT 255 226   Basic Metabolic Panel: Recent Labs  Lab 11/10/17 2123 11/11/17 0552  NA 134* 137  K 5.4* 4.1  CL 103 107  CO2 23 23  GLUCOSE 278* 122*  BUN 23 19  CREATININE 1.38* 1.03  CALCIUM 9.3 8.7*   GFR: Estimated Creatinine Clearance: 69.8 mL/min (by C-G formula based on SCr of 1.03 mg/dL).  Liver Function Tests: Recent Labs  Lab 11/10/17 2123 11/11/17 0552  AST 22 19  ALT 25 21  ALKPHOS 62 57  BILITOT 0.6 0.5  PROT 7.1 6.5  ALBUMIN 3.9 3.6   Recent Labs  Lab 11/10/17 2123  LIPASE 33    Cardiac Enzymes: Recent Labs  Lab 11/10/17 2123 11/10/17 2349  11/11/17 0552 11/11/17 1125  TROPONINI <0.03 <0.03 <0.03 <0.03    HbA1C: Hgb A1c MFr Bld  Date/Time Value Ref Range Status  02/02/2017 11:23 AM 7.9 (H) 4.8 - 5.6 % Final    Comment:    (NOTE) Pre diabetes:          5.7%-6.4% Diabetes:              >6.4% Glycemic control for   <7.0% adults with diabetes   11/29/2016 08:13 AM 6.6 (H) 4.8 - 5.6 % Final    Comment:    (NOTE) Pre diabetes:          5.7%-6.4% Diabetes:              >6.4% Glycemic control for   <7.0% adults with diabetes     CBG: Recent Labs  Lab 11/11/17 1304 11/11/17 1657 11/11/17 1837 11/12/17 0625 11/12/17 1109  GLUCAP 136* 232* 134* 138* 153*     Scheduled Meds: . aspirin EC  81 mg Oral Daily  . lisinopril  5 mg Oral Daily  . metoprolol tartrate  12.5 mg Oral BID  . simvastatin  40 mg Oral QHS     LOS: 0 days    Lonia Blood, MD [facility] Office  [phone] Pager - Text Page per Amion as per below:  On-Call/Text Page:      [email or web address]  If 7PM-7AM, please contact night-coverage [email or web address] 11/12/2017, 3:01 PM

## 2017-11-13 ENCOUNTER — Other Ambulatory Visit: Payer: Self-pay | Admitting: Physician Assistant

## 2017-11-13 DIAGNOSIS — Z952 Presence of prosthetic heart valve: Secondary | ICD-10-CM | POA: Diagnosis not present

## 2017-11-13 DIAGNOSIS — I259 Chronic ischemic heart disease, unspecified: Secondary | ICD-10-CM | POA: Diagnosis not present

## 2017-11-13 DIAGNOSIS — I2510 Atherosclerotic heart disease of native coronary artery without angina pectoris: Secondary | ICD-10-CM | POA: Diagnosis not present

## 2017-11-13 DIAGNOSIS — R079 Chest pain, unspecified: Secondary | ICD-10-CM

## 2017-11-13 DIAGNOSIS — R0789 Other chest pain: Secondary | ICD-10-CM | POA: Diagnosis present

## 2017-11-13 DIAGNOSIS — E119 Type 2 diabetes mellitus without complications: Secondary | ICD-10-CM | POA: Diagnosis not present

## 2017-11-13 LAB — GLUCOSE, CAPILLARY: Glucose-Capillary: 185 mg/dL — ABNORMAL HIGH (ref 70–99)

## 2017-11-13 MED ORDER — METOPROLOL TARTRATE 25 MG PO TABS: 12.5000 mg | ORAL_TABLET | Freq: Two times a day (BID) | ORAL | 0 refills | Status: DC

## 2017-11-13 MED ORDER — SIMETHICONE 80 MG PO CHEW
80.0000 mg | CHEWABLE_TABLET | Freq: Three times a day (TID) | ORAL | Status: DC
  Administered 2017-11-13: 80 mg via ORAL
  Filled 2017-11-13: qty 1

## 2017-11-13 NOTE — Discharge Summary (Signed)
DISCHARGE SUMMARY  [name]  MR#: [id number]  DOB:1950/08/16  Date of Admission: 11/10/2017 Date of Discharge: 11/13/2017  Attending Physician:Aiza Vollrath Silvestre Gunner, MD  Patient's WUJ:WJXBJ, Sean Mutters, MD  Consults:  [facility]  Disposition: D/C home   Follow-up Appts: Follow-up Information    Laqueta Linden, MD Follow up.   Specialty:  Cardiology Why:  The office will call you for information on your stress test and follow-up appointment. See last page of After-Visit Summary for instructions. 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  In the ED he was given full dose aspirin and had a CT PE study that was negative for pulmonary embolus. Two-view chest x-ray with no acute findings. EKG with sinus tachycardia at 105 bpm and no significant ST/T wave changes noted. Troponins x3 sets were less than 0.03. Despite this, it was apparently felt after discussion with the on-call Cardiologist that the pt should be transferred to [facility] for consideration of a definitive Cardiology evaluation. This was arranged.   Hospital Course:  Chest pain - hx of CAD Sx not convincing for a cardiac source when pt provided more detail/expanded hx - monitored on tele w/o signif findings - repeat trop remained normal w/ no acute EKG changes - cleared for d/c home w/o further testing per Cardiology - to have outpt stress testing done via the Cardiology office in [place]  S/P AoVR Jan 2019 23 mm [facility]  pericardialvalveby Dr. Laneta Simmers on 02/06/2017 - clinically stable   DM2  CBG controlled   HLD Cont Zocor - LDL at goal (64)  BPH Cont Flomax  Allergies as of 11/13/2017   No Known Allergies     Medication List    TAKE these medications   acetaminophen 325 MG tablet Commonly known as:  TYLENOL Take 2 tablets (650 mg total) by mouth every 6 (six) hours as needed for mild pain.   aspirin EC 81 MG tablet Take 81 mg by mouth daily.   chlorhexidine 0.12 % solution Commonly known as:  PERIDEX Rinse with 15 mls twice daily for 30 seconds. Use after breakfast and at bedtime. Spit out excess. Do not swallow.   lisinopril 5 MG tablet Commonly known as:  PRINIVIL,ZESTRIL Take 5 mg by mouth daily.   metFORMIN 500 MG tablet Commonly known as:  GLUCOPHAGE Take 500 mg by mouth 2 (two) times daily with a meal.   metoprolol tartrate 25 MG tablet Commonly known as:  LOPRESSOR Take 0.5 tablets (12.5 mg total) by mouth 2 (two) times daily.   simvastatin 40 MG tablet Commonly known as:  ZOCOR Take 40 mg by mouth at bedtime.   tamsulosin 0.4 MG Caps capsule Commonly known as:  FLOMAX Take 0.4 mg by mouth daily as needed (frequent urination).   TRADJENTA 5 MG Tabs tablet Generic drug:  linagliptin Take 5 mg by mouth daily.   traMADol 50 MG tablet Commonly known as:  ULTRAM Take 50 mg by mouth every 4-6 hours PRN moderate to severe  pain.       Day of Discharge BP 122/85   Pulse (!) 105   Temp (!) 97.5 F (36.4 C) (Oral)   Resp 20   Ht 5\' 9"  (1.753 m)   Wt 69.5 kg   SpO2 95%   BMI 22.63 kg/m   Physical Exam: General: No acute respiratory distress Lungs: Clear to auscultation bilaterally without wheezes or crackles Cardiovascular: Regular rate and rhythm without murmur gallop or rub normal S1 and S2 Abdomen: Nontender, nondistended, soft, bowel sounds positive, no rebound, no ascites, no appreciable mass Extremities: No significant cyanosis, clubbing, or edema  bilateral lower extremities  Basic Metabolic Panel: Recent Labs  Lab 11/10/17 2123 11/11/17 0552  NA 134* 137  K 5.4* 4.1  CL 103 107  CO2 23 23  GLUCOSE 278* 122*  BUN 23 19  CREATININE 1.38* 1.03  CALCIUM 9.3 8.7*    Liver Function Tests: Recent Labs  Lab 11/10/17 2123 11/11/17 0552  AST 22 19  ALT 25 21  ALKPHOS 62 57  BILITOT 0.6 0.5  PROT 7.1 6.5  ALBUMIN 3.9 3.6   Recent Labs  Lab 11/10/17 2123  LIPASE 33    CBC: Recent Labs  Lab 11/10/17 2123 11/11/17 0552  WBC 14.2* 10.8*  NEUTROABS 11.2*  --   HGB 14.7 13.9  HCT 46.9 44.3  MCV 88.5 89.1  PLT 255 226    Cardiac Enzymes: Recent Labs  Lab 11/10/17 2123 11/10/17 2349 11/11/17 0552 11/11/17 1125  TROPONINI <0.03 <0.03 <0.03 <0.03    CBG: Recent Labs  Lab 11/11/17 1837 11/11/17 2132 11/12/17 0625 11/12/17 1109 11/12/17 1628  GLUCAP 134* 185* 138* 153* 169*    Time spent in discharge (includes decision making & examination of pt): 25 minutes  11/13/2017, 11:24 AM   Lonia Blood, MD [facility] Office  [phone] Pager [phone]  On-Call/Text Page:      [email or web address]      password [facility]
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
## 2017-11-13 NOTE — Progress Notes (Signed)
Per Dr Jacques Navy, OP stress test

## 2017-11-13 NOTE — Discharge Instructions (Signed)
° °  You will have a Stress Test scheduled at [facility]. Your doctor has ordered this test to check the blood flow in your heart arteries.  Please arrive 15 minutes early for paperwork. The whole test will take several hours. You may want to bring reading material to remain occupied while undergoing different parts of the test.  Instructions:  No food/drink after midnight the night before.  It is OK to take your morning meds with a sip of water EXCEPT for those types of medicines listed below or otherwise instructed.  No caffeine/decaf products 24 hours before, including medicines such as Excedrin or Goody Powders. Call if there are any questions.   Wear comfortable clothes and shoes.   Special Medication Instructions:  Remove nitroglycerin patches and do not take nitrate preparations such as Imdur/isosorbide the day of your test.  No Persantine/Theophylline or Aggrenox medicines should be used within 24 hours of the test.   If you are diabetic, do not take your oral medications the day of the test.  What To Expect: When you arrive in the lab, the technician will inject a small amount of radioactive tracer into your arm through an IV while you are resting quietly. This helps [place] to form pictures of your heart. You will likely only feel a sting from the IV. After a waiting period, resting pictures will be obtained under a big camera. These are the "before" pictures.  Next, you will be prepped for the stress portion of the test. If your doctor has chosen the pharmacologic test, then you will receive a medicine through your IV that may cause temporary nausea, flushing, shortness of breath and sometimes chest discomfort or vomiting. This is typically short-lived and usually resolves quickly. If you experience symptoms, that does not automatically mean the test is abnormal. Some patients do not experience any symptoms at all. Your blood pressure and heart rate will be  monitored, and we will be watching your EKG on a computer screen for any changes. During this portion of the test, the radiologist will inject another small amount of radioactive tracer into your IV. After a waiting period, you will undergo a second set of pictures. These are the "after" pictures.  The doctor reading the test will compare the before-and-after images to look for evidence of heart blockages or heart weakness. The test usually takes 1 day to complete, but in certain instances (for example, if a patient is over a certain weight limit), the test may be done over the span of 2 days.

## 2017-11-13 NOTE — Progress Notes (Signed)
   Received request from Dr. Jacques Navy to arrange Lexiscan nuc at [facility] within 7-10 days and f/u within 4 weeks. Tried calling our [place] office but did not get answer, staff busy. I have sent a message to our office's scheduling team requesting these things, and our office will call the patient with this information. I have also added stress testing instructions to his AVS and spoke with patient over the phone about NPO and holding diabetic med.  Kelleen Stolze PA-C

## 2017-11-15 LAB — HIV ANTIBODY (ROUTINE TESTING W REFLEX): HIV Screen 4th Generation wRfx: NONREACTIVE

## 2017-11-27 DIAGNOSIS — E1129 Type 2 diabetes mellitus with other diabetic kidney complication: Secondary | ICD-10-CM | POA: Diagnosis not present

## 2017-12-04 DIAGNOSIS — I700 Atherosclerosis of aorta: Secondary | ICD-10-CM | POA: Diagnosis not present

## 2017-12-04 DIAGNOSIS — Z23 Encounter for immunization: Secondary | ICD-10-CM | POA: Diagnosis not present

## 2017-12-04 DIAGNOSIS — E1129 Type 2 diabetes mellitus with other diabetic kidney complication: Secondary | ICD-10-CM | POA: Diagnosis not present

## 2017-12-04 DIAGNOSIS — I25119 Atherosclerotic heart disease of native coronary artery with unspecified angina pectoris: Secondary | ICD-10-CM | POA: Diagnosis not present

## 2017-12-06 NOTE — Addendum Note (Signed)
Addended by: Oretha MilchSWANSON, Moira Umholtz on: 12/06/2017 02:29 PM   Modules accepted: Orders

## 2017-12-18 ENCOUNTER — Encounter (HOSPITAL_BASED_OUTPATIENT_CLINIC_OR_DEPARTMENT_OTHER)
Admission: RE | Admit: 2017-12-18 | Discharge: 2017-12-18 | Disposition: A | Discharge status: Home / Self Care | Payer: Medicare Other | Source: Ambulatory Visit | Attending: Physician Assistant | Admitting: Physician Assistant

## 2017-12-18 ENCOUNTER — Encounter (HOSPITAL_COMMUNITY)
Admission: RE | Admit: 2017-12-18 | Discharge: 2017-12-18 | Disposition: A | Discharge status: Home / Self Care | Payer: Medicare Other | Source: Ambulatory Visit | Attending: Physician Assistant | Admitting: Physician Assistant

## 2017-12-18 DIAGNOSIS — R079 Chest pain, unspecified: Secondary | ICD-10-CM | POA: Insufficient documentation

## 2017-12-18 LAB — NM MYOCAR MULTI W/SPECT W/WALL MOTION / EF
CHL CUP NUCLEAR SRS: 0
LHR: 0.27
LV dias vol: 73 mL (ref 62–150)
LVSYSVOL: 27 mL
Peak HR: 103 {beats}/min
Rest HR: 57 {beats}/min
SDS: 0
SSS: 0
TID: 0.95

## 2017-12-18 MED ORDER — SODIUM CHLORIDE 0.9% FLUSH
INTRAVENOUS | Status: AC
  Administered 2017-12-18: 10 mL via INTRAVENOUS
  Filled 2017-12-18: qty 10

## 2017-12-18 MED ORDER — REGADENOSON 0.4 MG/5ML IV SOLN
INTRAVENOUS | Status: AC
  Administered 2017-12-18: 0.4 mg via INTRAVENOUS
  Filled 2017-12-18: qty 5

## 2017-12-18 MED ORDER — TECHNETIUM TC 99M TETROFOSMIN IV KIT
30.0000 | PACK | Freq: Once | INTRAVENOUS | Status: AC | PRN
  Administered 2017-12-18: 30 via INTRAVENOUS

## 2017-12-18 MED ORDER — TECHNETIUM TC 99M TETROFOSMIN IV KIT
10.0000 | PACK | Freq: Once | INTRAVENOUS | Status: AC | PRN
  Administered 2017-12-18: 10.07 via INTRAVENOUS

## 2017-12-19 ENCOUNTER — Telehealth: Payer: Self-pay

## 2017-12-19 NOTE — Telephone Encounter (Signed)
-----   Message from Parke PoissonGayatri A Acharya, MD sent at 12/19/2017 10:38 AM EST ----- Low risk stress test. This patient was supposed to have a CV fu appt in [place] per my request that was not arranged  - can you arrange this for him Misty StanleyLisa?

## 2017-12-22 NOTE — Telephone Encounter (Signed)
Pt aware of stress test. Pt is scheduled for f/u with Dr.Koneswaran on 02/20/18 in the [place]

## 2018-02-01 ENCOUNTER — Other Ambulatory Visit: Payer: Self-pay

## 2018-02-01 ENCOUNTER — Emergency Department (HOSPITAL_COMMUNITY)
Admission: EM | Admit: 2018-02-01 | Discharge: 2018-02-01 | Disposition: A | Discharge status: Home / Self Care | Payer: Medicare Other | Attending: Emergency Medicine | Admitting: Emergency Medicine

## 2018-02-01 ENCOUNTER — Encounter (HOSPITAL_COMMUNITY): Payer: Self-pay

## 2018-02-01 ENCOUNTER — Emergency Department (HOSPITAL_COMMUNITY): Payer: Medicare Other

## 2018-02-01 DIAGNOSIS — R300 Dysuria: Secondary | ICD-10-CM

## 2018-02-01 DIAGNOSIS — E119 Type 2 diabetes mellitus without complications: Secondary | ICD-10-CM | POA: Diagnosis not present

## 2018-02-01 DIAGNOSIS — I252 Old myocardial infarction: Secondary | ICD-10-CM | POA: Diagnosis not present

## 2018-02-01 DIAGNOSIS — Z952 Presence of prosthetic heart valve: Secondary | ICD-10-CM | POA: Diagnosis not present

## 2018-02-01 DIAGNOSIS — Z7982 Long term (current) use of aspirin: Secondary | ICD-10-CM | POA: Insufficient documentation

## 2018-02-01 DIAGNOSIS — Z87891 Personal history of nicotine dependence: Secondary | ICD-10-CM | POA: Diagnosis not present

## 2018-02-01 DIAGNOSIS — R319 Hematuria, unspecified: Secondary | ICD-10-CM | POA: Diagnosis not present

## 2018-02-01 DIAGNOSIS — Z955 Presence of coronary angioplasty implant and graft: Secondary | ICD-10-CM | POA: Diagnosis not present

## 2018-02-01 DIAGNOSIS — I714 Abdominal aortic aneurysm, without rupture, unspecified: Secondary | ICD-10-CM

## 2018-02-01 DIAGNOSIS — Z79899 Other long term (current) drug therapy: Secondary | ICD-10-CM | POA: Diagnosis not present

## 2018-02-01 DIAGNOSIS — Z89612 Acquired absence of left leg above knee: Secondary | ICD-10-CM | POA: Insufficient documentation

## 2018-02-01 DIAGNOSIS — N390 Urinary tract infection, site not specified: Secondary | ICD-10-CM | POA: Diagnosis not present

## 2018-02-01 DIAGNOSIS — Z7984 Long term (current) use of oral hypoglycemic drugs: Secondary | ICD-10-CM | POA: Diagnosis not present

## 2018-02-01 LAB — URINALYSIS, ROUTINE W REFLEX MICROSCOPIC
BILIRUBIN URINE: NEGATIVE
Bacteria, UA: NONE SEEN
Glucose, UA: 50 mg/dL — AB
KETONES UR: NEGATIVE mg/dL
Nitrite: NEGATIVE
PROTEIN: 100 mg/dL — AB
Specific Gravity, Urine: 1.017 (ref 1.005–1.030)
pH: 6 (ref 5.0–8.0)

## 2018-02-01 MED ORDER — CIPROFLOXACIN HCL 250 MG PO TABS
500.0000 mg | ORAL_TABLET | Freq: Once | ORAL | Status: AC
  Administered 2018-02-01: 500 mg via ORAL
  Filled 2018-02-01: qty 2

## 2018-02-01 MED ORDER — CIPROFLOXACIN HCL 500 MG PO TABS: 500.0000 mg | ORAL_TABLET | Freq: Two times a day (BID) | ORAL | 0 refills | Status: DC

## 2018-02-01 NOTE — Discharge Instructions (Signed)
Your CT scan showed an incidental finding:  "Enlargement of known abdominal aortic aneurysm to 5 cm. Recommend followup by abdomen and pelvis CTA in 3-6 months. This recommendation follows ACR consensus guidelines: White Paper of the ACR Incidental Findings Committee II on Vascular Findings. J Am Coll Radiol 2013; 10:789-794."  Call the Vascular Surgeon today to schedule a follow up appointment within the next week. Take the prescription as directed.  Call your regular medical doctor today to schedule a follow up appointment within the next 3 days. Call the Urologist today to schedule a follow up appointment within the next week.  Return to the Emergency Department immediately sooner if worsening.

## 2018-02-01 NOTE — ED Provider Notes (Signed)
[facility] EMERGENCY DEPARTMENT Provider Note   CSN: [id number] Arrival date & time: 02/01/18  1046     History   Chief Complaint Chief Complaint  Patient presents with  . Hematuria    HPI [name] is a 68 y.o. male.  HPI  Pt was seen at 1125. Per pt, c/o gradual onset and persistence of constant hematuria since last night. Pt states he had mild dysuria early this morning which has resolved. Denies flank pain, no back pain, no fevers, no abd pain, no N/V/D, no testicular pain/swelling, no penile discharge, no perineal pain, no rash.   Past Medical History:  Diagnosis Date  . Aortic stenosis   . Arthritis   . CAD (coronary artery disease)    cabg  . Diabetes mellitus    Type II  . Heart murmur   . Hyperlipidemia   . MI (myocardial infarction) (HCC)   . S/P AKA (above knee amputation) [facility])     Patient Active Problem List   Diagnosis Date Noted  . H/O aortic valve replacement 11/13/2017  . 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 Severe aortic stenosis   . Aortic stenosis 04/29/2013  . Aortic regurgitation 04/29/2013  . Murmur, cardiac 11/22/2010  . Hyperlipidemia 10/24/2008  . Coronary atherosclerosis 10/24/2008  . CAROTID BRUIT 10/24/2008  . Diabetes mellitus without complication (HCC) 10/21/2008    Past Surgical History:  Procedure Laterality Date  . ABOVE KNEE LEG AMPUTATION Left 1972  . AORTIC VALVE REPLACEMENT N/A 02/06/2017   Procedure: AORTIC VALVE REPLACEMENT (AVR);  Surgeon: Alleen Borne, MD;  Location: [facility] OR;  Service: Open Heart Surgery;  Laterality: N/A;  . COLONOSCOPY    . CORONARY ANGIOPLASTY WITH STENT PLACEMENT    . LEG SURGERY     Repair of left leg trauma  . MULTIPLE EXTRACTIONS WITH ALVEOLOPLASTY N/A 11/29/2016   Procedure: Extraction of tooth #'s 4,09,81,19 and 32 with alveoloplasty and gross debridement of remaining teeth;  Surgeon: Charlynne Pander, DDS;  Location: [facility] OR;  Service: Oral Surgery;  Laterality: N/A;  . RIGHT/LEFT  HEART CATH AND CORONARY ANGIOGRAPHY N/A 11/14/2016   Procedure: RIGHT/LEFT HEART CATH AND CORONARY ANGIOGRAPHY;  Surgeon: Kathleene Hazel, MD;  Location: [facility] INVASIVE CV LAB;  Service: Cardiovascular;  Laterality: N/A;  . TEE WITHOUT CARDIOVERSION N/A 02/06/2017   Procedure: TRANSESOPHAGEAL ECHOCARDIOGRAM (TEE);  Surgeon: Alleen Borne, MD;  Location: [facility] OR;  Service: Open Heart Surgery;  Laterality: N/A;        Home Medications    Prior to Admission medications   Medication Sig Start Date End Date Taking? Authorizing Provider  acetaminophen (TYLENOL) 325 MG tablet Take 2 tablets (650 mg total) by mouth every 6 (six) hours as needed for mild pain. 02/13/17   Ardelle Balls, PA-C  aspirin EC 81 MG tablet Take 81 mg by mouth daily.    [provider]  chlorhexidine (PERIDEX) 0.12 % solution Rinse with 15 mls twice daily for 30 seconds. Use after breakfast and at bedtime. Spit out excess. Do not swallow. 12/13/16   Charlynne Pander, DDS  linagliptin (TRADJENTA) 5 MG TABS tablet Take 5 mg by mouth daily.    [provider]  lisinopril (PRINIVIL,ZESTRIL) 5 MG tablet Take 5 mg by mouth daily. 07/05/17   [provider]  metFORMIN (GLUCOPHAGE) 500 MG tablet Take 500 mg by mouth 2 (two) times daily with a meal.    [provider]  metoprolol tartrate (LOPRESSOR) 25 MG tablet Take 0.5  tablets (12.5 mg total) by mouth 2 (two) times daily. 11/13/17   Lonia Blood, MD  simvastatin (ZOCOR) 40 MG tablet Take 40 mg by mouth at bedtime.      [provider]  tamsulosin (FLOMAX) 0.4 MG CAPS capsule Take 0.4 mg by mouth daily as needed (frequent urination).     [provider]  traMADol (ULTRAM) 50 MG tablet Take 50 mg by mouth every 4-6 hours PRN moderate to severe pain. 02/13/17   Ardelle Balls, PA-C    Family History Family History  Problem Relation Age of Onset  . Hypertension Mother   . Heart attack Father     Social  History Social History   Tobacco Use  . Smoking status: Former Smoker    Packs/day: 0.30    Years: 60.00    Pack years: 18.00    Types: Cigars, Cigarettes    Start date: 01/18/1956    Last attempt to quit: 11/25/2015    Years since quitting: 2.1  . Smokeless tobacco: Never Used  Substance Use Topics  . Alcohol use: No    Alcohol/week: 0.0 standard drinks    Frequency: Never    Comment: Occasional  . Drug use: Yes    Types: Marijuana     Allergies   Patient has no known allergies.   Review of Systems Review of Systems ROS: Statement: All systems negative except as marked or noted in the HPI; Constitutional: Negative for fever and chills. ; ; Eyes: Negative for eye pain, redness and discharge. ; ; ENMT: Negative for ear pain, hoarseness, nasal congestion, sinus pressure and sore throat. ; ; Cardiovascular: Negative for chest pain, palpitations, diaphoresis, dyspnea and peripheral edema. ; ; Respiratory: Negative for cough, wheezing and stridor. ; ; Gastrointestinal: Negative for nausea, vomiting, diarrhea, abdominal pain, blood in stool, hematemesis, jaundice and rectal bleeding. . ; ; Genitourinary: Negative for flank pain and +dysuria, hematuria. ; ; Genital:  No penile drainage or rash, no testicular pain or swelling, no scrotal rash or swelling.  Musculoskeletal: Negative for back pain and neck pain. Negative for swelling and trauma.; ; Skin: Negative for pruritus, rash, abrasions, blisters, bruising and skin lesion.; ; Neuro: Negative for headache, lightheadedness and neck stiffness. Negative for weakness, altered level of consciousness, altered mental status, extremity weakness, paresthesias, involuntary movement, seizure and syncope.       Physical Exam Updated Vital Signs BP 119/82   Pulse 84   Temp 98.1 F (36.7 C) (Oral)   Resp 16   Ht 5\' 10"  (1.778 m)   Wt 73.9 kg   SpO2 97%   BMI 23.39 kg/m   Physical Exam 1130: Physical examination:  Nursing notes reviewed;  Vital signs and O2 SAT reviewed;  Constitutional: Well developed, Well nourished, Well hydrated, In no acute distress; Head:  Normocephalic, atraumatic; Eyes: EOMI, PERRL, No scleral icterus; ENMT: Mouth and pharynx normal, Mucous membranes moist; Neck: Supple, Full range of motion, No lymphadenopathy; Cardiovascular: Regular rate and rhythm, No gallop; Respiratory: Breath sounds clear & equal bilaterally, No wheezes.  Speaking full sentences with ease, Normal respiratory effort/excursion; Chest: Nontender, Movement normal; Abdomen: Soft, Nontender, Nondistended, Normal bowel sounds; Genitourinary: No CVA tenderness;  Spine:  No midline CS, TS, LS tenderness.;;  Extremities: Peripheral pulses normal, No tenderness, No edema, No calf edema or asymmetry.; Neuro: AA&Ox3, Major CN grossly intact.  Speech clear. No gross focal motor or sensory deficits in extremities.; Skin: Color normal, Warm, Dry.   ED Treatments / Results  Labs (all labs ordered are listed, but only abnormal results are displayed)   EKG None  Radiology   Procedures Procedures (including critical care time)  Medications Ordered in ED Medications - No data to display   Initial Impression / Assessment and Plan / ED Course  I have reviewed the triage vital signs and the nursing notes.  Pertinent labs & imaging results that were available during my care of the patient were reviewed by me and considered in my medical decision making (see chart for details).  MDM Reviewed: previous chart, nursing note and vitals Interpretation: labs and CT scan    Results for orders placed or performed during the hospital encounter of 02/01/18  Urinalysis, Routine w reflex microscopic  Result Value Ref Range   Color, Urine AMBER (A) YELLOW   APPearance CLOUDY (A) CLEAR   Specific Gravity, Urine 1.017 1.005 - 1.030   pH 6.0 5.0 - 8.0   Glucose, UA 50 (A) NEGATIVE mg/dL   Hgb urine dipstick LARGE (A) NEGATIVE   Bilirubin Urine NEGATIVE  NEGATIVE   Ketones, ur NEGATIVE NEGATIVE mg/dL   Protein, ur 161100 (A) NEGATIVE mg/dL   Nitrite NEGATIVE NEGATIVE   Leukocytes, UA MODERATE (A) NEGATIVE   RBC / HPF >50 (H) 0 - 5 RBC/hpf   WBC, UA >50 (H) 0 - 5 WBC/hpf   Bacteria, UA NONE SEEN NONE SEEN   WBC Clumps PRESENT    Ct Renal Stone Study Result Date: 02/01/2018 CLINICAL DATA:  Hematuria and passing clots EXAM: CT ABDOMEN AND PELVIS WITHOUT CONTRAST TECHNIQUE: Multidetector CT imaging of the abdomen and pelvis was performed following the standard protocol without IV contrast. COMPARISON:  07/28/05 FINDINGS: Lower chest: Lung bases demonstrate no focal infiltrate or sizable effusion. Mild fibrotic changes are noted. Hepatobiliary: No focal liver abnormality is seen. No gallstones, gallbladder wall thickening, or biliary dilatation. Pancreas: Unremarkable. No pancreatic ductal dilatation or surrounding inflammatory changes. Spleen: Normal in size without focal abnormality. Adrenals/Urinary Tract: Mild nodularity of the adrenal glands is noted stable from prior exam. Kidneys are well visualized. No calculi are identified. No obstructive changes are seen. Bladder is decompressed. Stomach/Bowel: The appendix is within normal limits. No obstructive or inflammatory changes of the bowel are seen. The stomach is within normal limits. Vascular/Lymphatic: Atherosclerotic changes are identified within the aorta. There is fusiform aneurysmal dilatation of the abdominal aorta increased when compared with the prior exam now measuring 5.0 cm in greatest dimension. The aneurysm extends to the level of aortic bifurcation. No perianeurysmal density to suggest acute rupture is identified. Small outpouching from the posterior aspect of the aneurysm is noted consistent with a focal aortic blister. Reproductive: Prostate is unremarkable. Other: No abdominal wall hernia or abnormality. No abdominopelvic ascites. Musculoskeletal: Fatty atrophy of the thigh muscles is noted  consistent with the known history of prior amputation. No acute bony abnormality is noted. IMPRESSION: Enlargement of known abdominal aortic aneurysm to 5 cm. Recommend followup by abdomen and pelvis CTA in 3-6 months, and vascular surgery referral/consultation if not already obtained. This recommendation follows ACR consensus guidelines: White Paper of the ACR Incidental Findings Committee II on Vascular Findings. J Am Coll Radiol 2013; 10:789-794. No acute abnormality is identified to correspond with the patient's given clinical history of hematuria. Electronically Signed   By: Alcide CleverMark  Lukens M.D.   On: 02/01/2018 12:41    1255:  Will tx for UTI while UC pending. 1st dose abx given in ED. Abd remains benign, VSS. Pt continues to  deny abd pain, back pain. CT as above; pt does not recall being told he had an aneurysm. T/C returned from [facility] Dr. Randie Heinz, case discussed, including:  HPI, pertinent PM/SHx, VS/PE, dx testing, ED course and treatment:  If pt without symptoms related to aneurysm, pt can f/u in office. Dx and testing, as well as d/w Vasc MD, d/w pt.  Questions answered.  Verb understanding, agreeable to d/c home with outpt f/u.      Final Clinical Impressions(s) / ED Diagnoses   Final diagnoses:  None    ED Discharge Orders    None       Samuel Jester, DO 02/04/18 0981

## 2018-02-01 NOTE — ED Notes (Signed)
Pt still states denies any abdominal or back pain at this time.  VSS

## 2018-02-01 NOTE — ED Triage Notes (Signed)
Pt reports blood and clots in urine last night. Mild burning this morning which has resolved

## 2018-02-03 LAB — URINE CULTURE

## 2018-02-04 ENCOUNTER — Telehealth: Payer: Self-pay | Admitting: Emergency Medicine

## 2018-02-04 NOTE — Telephone Encounter (Signed)
Post ED Visit - Positive Culture Follow-up  Culture report reviewed by antimicrobial stewardship pharmacist:  []  Enzo Bi, Pharm.D. []  Celedonio Miyamoto, Pharm.D., BCPS AQ-ID []  Garvin Fila, Pharm.D., BCPS []  Georgina Pillion, [address].D., BCPS []  [place], [address].D., BCPS, AAHIVP []  Estella Husk, Pharm.D., BCPS, AAHIVP []  Lysle Pearl, PharmD, BCPS []  Phillips Climes, PharmD, BCPS []  Agapito Games, PharmD, BCPS [x]  Verlan Friends, PharmD  Positive urine culture Treated with Ciprofloxacin, organism sensitive to the same and no further patient follow-up is required at this time.  Norm Parcel RN 02/04/2018, 1:14 PM

## 2018-02-08 ENCOUNTER — Other Ambulatory Visit: Payer: Self-pay

## 2018-02-08 DIAGNOSIS — I714 Abdominal aortic aneurysm, without rupture, unspecified: Secondary | ICD-10-CM

## 2018-02-20 ENCOUNTER — Ambulatory Visit (INDEPENDENT_AMBULATORY_CARE_PROVIDER_SITE_OTHER): Payer: Medicare Other | Admitting: Cardiovascular Disease

## 2018-02-20 ENCOUNTER — Encounter: Payer: Self-pay | Admitting: Cardiovascular Disease

## 2018-02-20 VITALS — BP 116/68 | HR 84 | Ht 70.0 in | Wt 170.0 lb

## 2018-02-20 DIAGNOSIS — I10 Essential (primary) hypertension: Secondary | ICD-10-CM

## 2018-02-20 DIAGNOSIS — E785 Hyperlipidemia, unspecified: Secondary | ICD-10-CM | POA: Diagnosis not present

## 2018-02-20 DIAGNOSIS — Z952 Presence of prosthetic heart valve: Secondary | ICD-10-CM | POA: Diagnosis not present

## 2018-02-20 DIAGNOSIS — I25118 Atherosclerotic heart disease of native coronary artery with other forms of angina pectoris: Secondary | ICD-10-CM | POA: Diagnosis not present

## 2018-02-20 DIAGNOSIS — Z955 Presence of coronary angioplasty implant and graft: Secondary | ICD-10-CM

## 2018-02-20 NOTE — Patient Instructions (Signed)
Medication Instructions:  Your physician recommends that you continue on your current medications as directed. Please refer to the Current Medication list given to you today.  If you need a refill on your cardiac medications before your next appointment, please call your pharmacy.   Lab work: None today If you have labs (blood work) drawn today and your tests are completely normal, you will receive your results only by: . MyChart Message (if you have MyChart) OR . A paper copy in the mail If you have any lab test that is abnormal or we need to change your treatment, we will call you to review the results.  Testing/Procedures: None today  Follow-Up: At [facility], you and your health needs are our priority.  As part of our continuing mission to provide you with exceptional heart care, we have created designated Provider Care Teams.  These Care Teams include your primary Cardiologist (physician) and Advanced Practice Providers (APPs -  Physician Assistants and Nurse Practitioners) who all work together to provide you with the care you need, when you need it. You will need a follow up appointment in 12 months.  Please call our office 2 months in advance to schedule this appointment.  You may see Suresh Koneswaran, MD or one of the following Advanced Practice Providers on your designated Care Team:   Brittany Strader, PA-C ([place] Office) . Michele Lenze, PA-C ([place] Office)  Any Other Special Instructions Will Be Listed Below (If Applicable). None   
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
## 2018-03-28 DIAGNOSIS — E1129 Type 2 diabetes mellitus with other diabetic kidney complication: Secondary | ICD-10-CM | POA: Diagnosis not present

## 2018-03-30 ENCOUNTER — Other Ambulatory Visit: Payer: Self-pay

## 2018-03-30 DIAGNOSIS — I714 Abdominal aortic aneurysm, without rupture, unspecified: Secondary | ICD-10-CM

## 2018-04-04 DIAGNOSIS — I714 Abdominal aortic aneurysm, without rupture: Secondary | ICD-10-CM | POA: Diagnosis not present

## 2018-04-04 DIAGNOSIS — E1129 Type 2 diabetes mellitus with other diabetic kidney complication: Secondary | ICD-10-CM | POA: Diagnosis not present

## 2018-04-04 DIAGNOSIS — Z89612 Acquired absence of left leg above knee: Secondary | ICD-10-CM | POA: Diagnosis not present

## 2018-04-04 DIAGNOSIS — M25511 Pain in right shoulder: Secondary | ICD-10-CM | POA: Diagnosis not present

## 2018-04-09 ENCOUNTER — Other Ambulatory Visit: Payer: Self-pay | Admitting: Physician Assistant

## 2018-05-12 ENCOUNTER — Emergency Department (HOSPITAL_COMMUNITY)
Admission: EM | Admit: 2018-05-12 | Discharge: 2018-05-12 | Disposition: A | Discharge status: Home / Self Care | Payer: Medicare Other | Attending: Emergency Medicine | Admitting: Emergency Medicine

## 2018-05-12 ENCOUNTER — Encounter (HOSPITAL_COMMUNITY): Payer: Self-pay

## 2018-05-12 ENCOUNTER — Other Ambulatory Visit: Payer: Self-pay

## 2018-05-12 DIAGNOSIS — Z89612 Acquired absence of left leg above knee: Secondary | ICD-10-CM | POA: Insufficient documentation

## 2018-05-12 DIAGNOSIS — R1111 Vomiting without nausea: Secondary | ICD-10-CM | POA: Diagnosis not present

## 2018-05-12 DIAGNOSIS — I2510 Atherosclerotic heart disease of native coronary artery without angina pectoris: Secondary | ICD-10-CM | POA: Insufficient documentation

## 2018-05-12 DIAGNOSIS — K529 Noninfective gastroenteritis and colitis, unspecified: Secondary | ICD-10-CM | POA: Insufficient documentation

## 2018-05-12 DIAGNOSIS — Z794 Long term (current) use of insulin: Secondary | ICD-10-CM | POA: Diagnosis not present

## 2018-05-12 DIAGNOSIS — Z87891 Personal history of nicotine dependence: Secondary | ICD-10-CM | POA: Insufficient documentation

## 2018-05-12 DIAGNOSIS — I252 Old myocardial infarction: Secondary | ICD-10-CM | POA: Diagnosis not present

## 2018-05-12 DIAGNOSIS — I10 Essential (primary) hypertension: Secondary | ICD-10-CM | POA: Diagnosis not present

## 2018-05-12 DIAGNOSIS — R197 Diarrhea, unspecified: Secondary | ICD-10-CM | POA: Diagnosis not present

## 2018-05-12 DIAGNOSIS — E1165 Type 2 diabetes mellitus with hyperglycemia: Secondary | ICD-10-CM | POA: Diagnosis not present

## 2018-05-12 DIAGNOSIS — R739 Hyperglycemia, unspecified: Secondary | ICD-10-CM

## 2018-05-12 DIAGNOSIS — R1110 Vomiting, unspecified: Secondary | ICD-10-CM | POA: Diagnosis present

## 2018-05-12 DIAGNOSIS — R110 Nausea: Secondary | ICD-10-CM | POA: Diagnosis not present

## 2018-05-12 DIAGNOSIS — R52 Pain, unspecified: Secondary | ICD-10-CM | POA: Diagnosis not present

## 2018-05-12 LAB — COMPREHENSIVE METABOLIC PANEL
ALT: 18 U/L (ref 0–44)
AST: 23 U/L (ref 15–41)
Albumin: 4.2 g/dL (ref 3.5–5.0)
Alkaline Phosphatase: 81 U/L (ref 38–126)
Anion gap: 15 (ref 5–15)
BUN: 16 mg/dL (ref 8–23)
CO2: 22 mmol/L (ref 22–32)
Calcium: 9.7 mg/dL (ref 8.9–10.3)
Chloride: 99 mmol/L (ref 98–111)
Creatinine, Ser: 1.02 mg/dL (ref 0.61–1.24)
GFR calc Af Amer: >60 mL/min (ref >60)
GFR calc non Af Amer: >60 mL/min (ref >60)
Glucose, Bld: 252 mg/dL — ABNORMAL HIGH (ref 70–99)
Potassium: 4 mmol/L (ref 3.5–5.1)
Sodium: 136 mmol/L (ref 135–145)
Total Bilirubin: 0.6 mg/dL (ref 0.3–1.2)
Total Protein: 8.1 g/dL (ref 6.5–8.1)

## 2018-05-12 LAB — CBC WITH DIFFERENTIAL/PLATELET
Abs Immature Granulocytes: 0.1 10*3/uL — ABNORMAL HIGH (ref 0.00–0.07)
Basophils Absolute: 0 10*3/uL (ref 0.0–0.1)
Basophils Relative: 0 %
Eosinophils Absolute: 0 10*3/uL (ref 0.0–0.5)
Eosinophils Relative: 0 %
HCT: 48.3 % (ref 39.0–52.0)
Hemoglobin: 15.8 g/dL (ref 13.0–17.0)
Immature Granulocytes: 1 %
Lymphocytes Relative: 6 %
Lymphs Abs: 0.7 10*3/uL (ref 0.7–4.0)
MCH: 28.8 pg (ref 26.0–34.0)
MCHC: 32.7 g/dL (ref 30.0–36.0)
MCV: 88 fL (ref 80.0–100.0)
Monocytes Absolute: 0.7 10*3/uL (ref 0.1–1.0)
Monocytes Relative: 6 %
Neutro Abs: 9.9 10*3/uL — ABNORMAL HIGH (ref 1.7–7.7)
Neutrophils Relative %: 87 %
Platelets: 290 10*3/uL (ref 150–400)
RBC: 5.49 MIL/uL (ref 4.22–5.81)
RDW: 14.4 % (ref 11.5–15.5)
WBC: 11.3 10*3/uL — ABNORMAL HIGH (ref 4.0–10.5)
nRBC: 0 % (ref 0.0–0.2)

## 2018-05-12 LAB — LIPASE, BLOOD: Lipase: 43 U/L (ref 11–51)

## 2018-05-12 MED ORDER — ONDANSETRON HCL 4 MG/2ML IJ SOLN
4.0000 mg | Freq: Once | INTRAMUSCULAR | Status: AC
  Administered 2018-05-12: 16:00:00 4 mg via INTRAVENOUS
  Filled 2018-05-12: qty 2

## 2018-05-12 MED ORDER — SODIUM CHLORIDE 0.9 % IV BOLUS
1000.0000 mL | Freq: Once | INTRAVENOUS | Status: AC
  Administered 2018-05-12: 1000 mL via INTRAVENOUS

## 2018-05-12 MED ORDER — ONDANSETRON 8 MG PO TBDP
8.0000 mg | ORAL_TABLET | Freq: Once | ORAL | Status: AC
  Administered 2018-05-12: 8 mg via ORAL

## 2018-05-12 MED ORDER — ONDANSETRON HCL 4 MG/2ML IJ SOLN
4.0000 mg | Freq: Once | INTRAMUSCULAR | Status: AC
  Administered 2018-05-12: 4 mg via INTRAVENOUS
  Filled 2018-05-12: qty 2

## 2018-05-12 MED ORDER — ONDANSETRON HCL 8 MG PO TABS: 8.0000 mg | ORAL_TABLET | ORAL | 0 refills | Status: DC | PRN

## 2018-05-12 MED ORDER — ONDANSETRON 8 MG PO TBDP
ORAL_TABLET | ORAL | Status: AC
  Filled 2018-05-12: qty 1

## 2018-05-12 MED ORDER — PANTOPRAZOLE SODIUM 40 MG IV SOLR
40.0000 mg | Freq: Once | INTRAVENOUS | Status: AC
  Administered 2018-05-12: 40 mg via INTRAVENOUS
  Filled 2018-05-12: qty 40

## 2018-05-12 NOTE — ED Provider Notes (Signed)
[facility] Provider Note   CSN: [id number] Arrival date & time: 05/12/18  1538    History   Chief Complaint Chief Complaint  Patient presents with  . Abdominal Pain    HPI [name] is a 68 y.o. male.     Several episodes of vomiting and diarrhea since 2 AM last night after eating chicken salad for dinner.  No fever, chills, chest pain, dyspnea.  Patient blames it on the food he ate.  He has not been able to keep any fluids down.  Severity of symptoms is mild to moderate.  Nothing makes symptoms better or worse.     Past Medical History:  Diagnosis Date  . Aortic stenosis   . Arthritis   . CAD (coronary artery disease)    cabg  . Diabetes mellitus    Type II  . Heart murmur   . Hyperlipidemia   . MI (myocardial infarction) (HCC)   . S/P AKA (above knee amputation) [facility](HCC)     Patient Active Problem List   Diagnosis Date Noted  . H/O aortic valve replacement 11/13/2017  . 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 Severe aortic stenosis   . Aortic stenosis 04/29/2013  . Aortic regurgitation 04/29/2013  . Murmur, cardiac 11/22/2010  . Hyperlipidemia 10/24/2008  . Coronary atherosclerosis 10/24/2008  . CAROTID BRUIT 10/24/2008  . Diabetes mellitus without complication (HCC) 10/21/2008    Past Surgical History:  Procedure Laterality Date  . ABOVE KNEE LEG AMPUTATION Left 1972  . AORTIC VALVE REPLACEMENT N/A 02/06/2017   Procedure: AORTIC VALVE REPLACEMENT (AVR);  Surgeon: Alleen BorneBartle, Bryan K, MD;  [facility];  Service: Open Heart Surgery;  Laterality: N/A;  . COLONOSCOPY    . CORONARY ANGIOPLASTY WITH STENT PLACEMENT    . LEG SURGERY     Repair of left leg trauma  . MULTIPLE EXTRACTIONS WITH ALVEOLOPLASTY N/A 11/29/2016   Procedure: Extraction of tooth #'s 6,57,84,691,24,25,26 and 32 with alveoloplasty and gross debridement of remaining teeth;  Surgeon: Charlynne PanderKulinski, Ronald F, DDS;  Location: [facility] OR;  Service: Oral Surgery;  Laterality: N/A;  .  RIGHT/LEFT HEART CATH AND CORONARY ANGIOGRAPHY N/A 11/14/2016   Procedure: RIGHT/LEFT HEART CATH AND CORONARY ANGIOGRAPHY;  Surgeon: Kathleene HazelMcAlhany, Christopher D, MD;  Location: [facility] INVASIVE CV LAB;  Service: Cardiovascular;  Laterality: N/A;  . TEE WITHOUT CARDIOVERSION N/A 02/06/2017   Procedure: TRANSESOPHAGEAL ECHOCARDIOGRAM (TEE);  Surgeon: Alleen BorneBartle, Bryan K, MD;  [facility];  Service: Open Heart Surgery;  Laterality: N/A;        Home Medications    Prior to Admission medications   Medication Sig Start Date End Date Taking? Authorizing Provider  aspirin EC 81 MG tablet Take 81 mg by mouth daily.   Yes [provider]  linagliptin (TRADJENTA) 5 MG TABS tablet Take 5 mg by mouth daily.   Yes [provider]  lisinopril (PRINIVIL,ZESTRIL) 5 MG tablet TAKE 1 TABLET(5 MG) BY MOUTH DAILY Patient taking differently: Take 5 mg by mouth daily.  04/09/18  Yes Dyann KiefLenze, Michele M, PA-C  metoprolol tartrate (LOPRESSOR) 25 MG tablet Take 0.5 tablets (12.5 mg total) by mouth 2 (two) times daily. 11/13/17  Yes Lonia BloodMcClung, Jeffrey T, MD  naproxen (NAPROSYN) 375 MG tablet Take 375 mg by mouth 2 (two) times daily as needed for mild pain.   Yes [provider]  simvastatin (ZOCOR) 40 MG tablet Take 40 mg by mouth at bedtime.     Yes [provider]  tamsulosin (FLOMAX) 0.4  MG CAPS capsule Take 0.4 mg by mouth daily.    Yes [provider]  TRESIBA FLEXTOUCH 100 UNIT/ML SOPN FlexTouch Pen Inject 20 Units into the skin every morning.  12/05/17  Yes [provider]  ondansetron (ZOFRAN) 8 MG tablet Take 1 tablet (8 mg total) by mouth every 4 (four) hours as needed. 05/12/18   Donnetta Hutching, MD    Family History Family History  Problem Relation Age of Onset  . Hypertension Mother   . Heart attack Father     Social History Social History   Tobacco Use  . Smoking status: Former Smoker    Packs/day: 0.30    Years: 60.00    Pack years: 18.00    Types: Cigars,  Cigarettes    Start date: 01/18/1956    Last attempt to quit: 11/25/2015    Years since quitting: 2.4  . Smokeless tobacco: Never Used  Substance Use Topics  . Alcohol use: No    Alcohol/week: 0.0 standard drinks    Frequency: Never    Comment: Occasional  . Drug use: Yes    Types: Marijuana     Allergies   Patient has no known allergies.   Review of Systems Review of Systems  All other systems reviewed and are negative.    Physical Exam Updated Vital Signs BP (!) 176/81   Pulse 70   Temp 97.7 F (36.5 C) (Oral)   Resp (!) 24   Ht 5\' 9"  (1.753 m)   Wt 77.1 kg   SpO2 98%   BMI 25.10 kg/m   Physical Exam Vitals signs and nursing note reviewed.  Constitutional:      Appearance: He is well-developed.  HENT:     Head: Normocephalic and atraumatic.  Eyes:     Conjunctiva/sclera: Conjunctivae normal.  Neck:     Musculoskeletal: Neck supple.  Cardiovascular:     Rate and Rhythm: Normal rate and regular rhythm.  Pulmonary:     Effort: Pulmonary effort is normal.     Breath sounds: Normal breath sounds.  Abdominal:     General: Bowel sounds are normal.     Palpations: Abdomen is soft.     Comments: Minimal epigastric tenderness.  Musculoskeletal: Normal range of motion.  Skin:    General: Skin is warm and dry.  Neurological:     Mental Status: He is alert and oriented to person, place, and time.  Psychiatric:        Behavior: Behavior normal.      ED Treatments / Results  Labs (all labs ordered are listed, but only abnormal results are displayed) Labs Reviewed  CBC WITH DIFFERENTIAL/PLATELET - Abnormal; Notable for the following components:      Result Value   WBC 11.3 (*)    Neutro Abs 9.9 (*)    Abs Immature Granulocytes 0.10 (*)    All other components within normal limits  COMPREHENSIVE METABOLIC PANEL - Abnormal; Notable for the following components:   Glucose, Bld 252 (*)    All other components within normal limits  LIPASE, BLOOD    EKG  None  Radiology No results found.  Procedures Procedures (including critical care time)  Medications Ordered in ED Medications  sodium chloride 0.9 % bolus 1,000 mL (0 mLs Intravenous Stopped 05/12/18 1815)  ondansetron (ZOFRAN) injection 4 mg (4 mg Intravenous Given 05/12/18 1622)  pantoprazole (PROTONIX) injection 40 mg (40 mg Intravenous Given 05/12/18 1622)  sodium chloride 0.9 % bolus 1,000 mL (1,000 mLs Intravenous New  Bag/Given 05/12/18 1843)  ondansetron (ZOFRAN) injection 4 mg (4 mg Intravenous Given 05/12/18 1929)     Initial Impression / Assessment and Plan / ED Course  I have reviewed the triage vital signs and the nursing notes.  Pertinent labs & imaging results that were available during my care of the patient were reviewed by me and considered in my medical decision making (see chart for details).        History and physical most consistent with gastroenteritis.  Glucose minimally elevated.  He feels better after 2 L of IV fluids, IV Protonix, IV Zofran.  Discharge medications Zofran 4 mg.  Final Clinical Impressions(s) / ED Diagnoses   Final diagnoses:  Gastroenteritis  Hyperglycemia    ED Discharge Orders         Ordered    ondansetron (ZOFRAN) 8 MG tablet  Every 4 hours PRN     05/12/18 1940           Donnetta Hutching, MD 05/12/18 1950

## 2018-05-12 NOTE — Discharge Instructions (Addendum)
Your glucose or sugar was slightly elevated tonight.  Clear liquids.  Medication for nausea.  Return if vomiting is persisting around 12 noon tomorrow.  Would expect you to be improving around that time.  Return for vomiting pools of blood.  Return for any new or worse symptoms

## 2018-05-12 NOTE — ED Triage Notes (Signed)
Pt c/o abd pain, v/d after eating chicken salad last night around 1730.  CBG 238.  Pt vomited x 2 with ems.

## 2018-05-12 NOTE — ED Notes (Signed)
Pt vomiting prior to giving Ginger-Ale to drink. Pt reports he wanted to try the PO challenge with Ginger-Ale per EDP order. Pt has taken a few sips and has been able to keep it down at this point. Will continue to assess.

## 2018-05-12 NOTE — ED Notes (Signed)
ED Provider at bedside. 

## 2018-05-12 NOTE — ED Provider Notes (Signed)
Patient initially refused to go home.  Patient given another dose of Zofran.  Patient still with a little bit of spitting up but no real vomiting.  May be a little bit of streaking of some blood in the vomit.  Patient does not want to go home but I had a discussion with him saying that labs vital signs do not indicate need for admission.  This is possibly food poisoning or either a gastroenteritis.  Gave him precautions that if he starts vomiting pools of blood he needs to get seen if he still vomiting around noon tomorrow and he needs to get seen again in follow-up.  Patient does have prescription for Zofran.   Vanetta Mulders, MD 05/12/18 2142

## 2018-05-15 ENCOUNTER — Other Ambulatory Visit: Payer: Self-pay

## 2018-05-15 ENCOUNTER — Encounter: Payer: Self-pay | Admitting: Orthopaedic Surgery

## 2018-05-15 ENCOUNTER — Ambulatory Visit (INDEPENDENT_AMBULATORY_CARE_PROVIDER_SITE_OTHER): Payer: Medicare Other | Admitting: Orthopaedic Surgery

## 2018-05-15 VITALS — BP 152/104 | HR 115 | Temp 97.4°F | Ht 69.0 in | Wt 157.0 lb

## 2018-05-15 DIAGNOSIS — G8929 Other chronic pain: Secondary | ICD-10-CM | POA: Diagnosis not present

## 2018-05-15 DIAGNOSIS — M25511 Pain in right shoulder: Secondary | ICD-10-CM

## 2018-05-15 MED ORDER — HYDROCODONE-ACETAMINOPHEN 5-325 MG PO TABS: ORAL_TABLET | ORAL | 0 refills | Status: DC

## 2018-05-15 NOTE — Progress Notes (Signed)
His right shoulder is hurting more.  He is not a surgical candidate.  He has marked changes in the shoulder from arthritis.  PROCEDURE NOTE:  The patient request injection, verbal consent was obtained.  The right shoulder was prepped appropriately after time out was performed.   Sterile technique was observed and injection of 1 cc of Depo-Medrol 40 mg with several cc's of plain xylocaine. Anesthesia was provided by ethyl chloride and a 20-gauge needle was used to inject the shoulder area. A posterior approach was used.  The injection was tolerated well.  A band aid dressing was applied.  The patient was advised to apply ice later today and tomorrow to the injection sight as needed.  I will call in pain medicine.  I have reviewed the [place] Controlled Substance Reporting System web site prior to prescribing narcotic medicine for this patient.   Return in one month.  Electronically Signed Darreld Mclean, MD 4/28/20208:56 AM

## 2018-05-17 ENCOUNTER — Other Ambulatory Visit: Payer: Self-pay

## 2018-05-17 DIAGNOSIS — I714 Abdominal aortic aneurysm, without rupture, unspecified: Secondary | ICD-10-CM

## 2018-05-29 ENCOUNTER — Inpatient Hospital Stay: Admission: RE | Admit: 2018-05-29 | Payer: Medicare Other | Source: Ambulatory Visit

## 2018-06-01 ENCOUNTER — Ambulatory Visit: Payer: Medicare Other | Admitting: Vascular Surgery

## 2018-06-05 ENCOUNTER — Ambulatory Visit (INDEPENDENT_AMBULATORY_CARE_PROVIDER_SITE_OTHER): Payer: Medicare Other | Admitting: Urology

## 2018-06-05 DIAGNOSIS — N3000 Acute cystitis without hematuria: Secondary | ICD-10-CM

## 2018-06-05 DIAGNOSIS — R310 Gross hematuria: Secondary | ICD-10-CM

## 2018-06-12 ENCOUNTER — Other Ambulatory Visit: Payer: Self-pay

## 2018-06-12 ENCOUNTER — Ambulatory Visit (INDEPENDENT_AMBULATORY_CARE_PROVIDER_SITE_OTHER): Payer: Medicare Other | Admitting: Orthopaedic Surgery

## 2018-06-12 ENCOUNTER — Encounter: Payer: Self-pay | Admitting: Orthopaedic Surgery

## 2018-06-12 VITALS — BP 113/72 | HR 75 | Temp 98.1°F | Ht 69.0 in | Wt 157.0 lb

## 2018-06-12 DIAGNOSIS — G8929 Other chronic pain: Secondary | ICD-10-CM

## 2018-06-12 DIAGNOSIS — M25511 Pain in right shoulder: Secondary | ICD-10-CM

## 2018-06-12 NOTE — Progress Notes (Signed)
Patient DX:[name], male DOB:03-18-1950, 68 y.o. MVE:[id number]  Chief Complaint  Patient presents with  . Shoulder Pain    right     HPI  [name] is a 68 y.o. male who has chronic right shoulder pain.  He has been using [facility] and doing his exercises and taking his pain medicine.  He has no new trauma, no swelling,no redness, no numbness but has some days where the shoulder hurts more than other days.   Body mass index is 23.18 kg/m.  ROS  Review of Systems  HENT: Negative for congestion.   Respiratory: Negative for cough and shortness of breath.   Cardiovascular: Negative for chest pain and leg swelling.  Endocrine: Positive for cold intolerance.  Musculoskeletal: Positive for arthralgias.  Allergic/Immunologic: Positive for environmental allergies.  All other systems reviewed and are negative.   All other systems reviewed and are negative.  The following is a summary of the past history medically, past history surgically, known current medicines, social history and family history.  This information is gathered electronically by the computer from prior information and documentation.  I review this each visit and have found including this information at this point in the chart is beneficial and informative.    Past Medical History:  Diagnosis Date  . Aortic stenosis   . Arthritis   . CAD (coronary artery disease)    cabg  . Diabetes mellitus    Type II  . Heart murmur   . Hyperlipidemia   . MI (myocardial infarction) (HCC)   . S/P AKA (above knee amputation) (HCC)     Past Surgical History:  Procedure Laterality Date  . ABOVE KNEE LEG AMPUTATION Left 1972  . AORTIC VALVE REPLACEMENT N/A 02/06/2017   Procedure: AORTIC VALVE REPLACEMENT (AVR);  Surgeon: Alleen Borne, MD;  Location: [facility] OR;  Service: Open Heart Surgery;  Laterality: N/A;  . COLONOSCOPY    . CORONARY ANGIOPLASTY WITH STENT PLACEMENT    . LEG SURGERY     Repair of left leg trauma   . MULTIPLE EXTRACTIONS WITH ALVEOLOPLASTY N/A 11/29/2016   Procedure: Extraction of tooth #'s 2,83,66,29 and 32 with alveoloplasty and gross debridement of remaining teeth;  Surgeon: Charlynne Pander, DDS;  Location: [facility] OR;  Service: Oral Surgery;  Laterality: N/A;  . RIGHT/LEFT HEART CATH AND CORONARY ANGIOGRAPHY N/A 11/14/2016   Procedure: RIGHT/LEFT HEART CATH AND CORONARY ANGIOGRAPHY;  Surgeon: Kathleene Hazel, MD;  Location: [facility] INVASIVE CV LAB;  Service: Cardiovascular;  Laterality: N/A;  . TEE WITHOUT CARDIOVERSION N/A 02/06/2017   Procedure: TRANSESOPHAGEAL ECHOCARDIOGRAM (TEE);  Surgeon: Alleen Borne, MD;  Location: [facility] OR;  Service: Open Heart Surgery;  Laterality: N/A;    Family History  Problem Relation Age of Onset  . Hypertension Mother   . Heart attack Father     Social History Social History   Tobacco Use  . Smoking status: Former Smoker    Packs/day: 0.30    Years: 60.00    Pack years: 18.00    Types: Cigars, Cigarettes    Start date: 01/18/1956    Last attempt to quit: 11/25/2015    Years since quitting: 2.5  . Smokeless tobacco: Never Used  Substance Use Topics  . Alcohol use: No    Alcohol/week: 0.0 standard drinks    Frequency: Never    Comment: Occasional  . Drug use: Yes    Types: Marijuana    No Known Allergies  Current Outpatient Medications  Medication Sig Dispense Refill  . aspirin EC 81 MG tablet Take 81 mg by mouth daily.    [name]. HYDROcodone-acetaminophen (NORCO/VICODIN) 5-325 MG tablet One tablet by mouth every six hours as needed for pain. 56 tablet 0  . linagliptin (TRADJENTA) 5 MG TABS tablet Take 5 mg by mouth daily.    . naproxen (NAPROSYN) 375 MG tablet Take 375 mg by mouth 2 (two) times daily as needed for mild pain.    [name]. ondansetron (ZOFRAN) 8 MG tablet Take 1 tablet (8 mg total) by mouth every 4 (four) hours as needed. 10 tablet 0  . simvastatin (ZOCOR) 40 MG tablet Take 40 mg by mouth at bedtime.      . tamsulosin (FLOMAX)  0.4 MG CAPS capsule Take 0.4 mg by mouth daily.     [name]. TRESIBA FLEXTOUCH 100 UNIT/ML SOPN FlexTouch Pen Inject 20 Units into the skin every morning.     [name]. lisinopril (PRINIVIL,ZESTRIL) 5 MG tablet TAKE 1 TABLET(5 MG) BY MOUTH DAILY (Patient not taking: No sig reported) 90 tablet 3  . metoprolol tartrate (LOPRESSOR) 25 MG tablet Take 0.5 tablets (12.5 mg total) by mouth 2 (two) times daily. (Patient not taking: Reported on 05/15/2018) 60 tablet 0   No current facility-administered medications for this visit.      Physical Exam  Blood pressure 113/72, pulse 75, temperature 98.1 F (36.7 C), height 5\' 9"  (1.753 m), weight 157 lb (71.2 kg).  Constitutional: overall normal hygiene, normal nutrition, well developed, normal grooming, normal body habitus. Assistive device:none  Musculoskeletal: gait and station Limp none, muscle tone and strength are normal, no tremors or atrophy is present.  .  Neurological: coordination overall normal.  Deep tendon reflex/nerve stretch intact.  Sensation normal.  Cranial nerves II-XII intact.   Skin:   Normal overall no scars, lesions, ulcers or rashes. No psoriasis.  Psychiatric: Alert and oriented x 3.  Recent memory intact, remote memory unclear.  Normal mood and affect. Well groomed.  Good eye contact.  Cardiovascular: overall no swelling, no varicosities, no edema bilaterally, normal temperatures of the legs and arms, no clubbing, cyanosis and good capillary refill.  Lymphatic: palpation is normal.  Right shoulder has full ROM but pain in the extremes.  NV intact.  No effusion or redness is present.  All other systems reviewed and are negative   The patient has been educated about the nature of the problem(s) and counseled on treatment options.  The patient appeared to understand what I have discussed and is in agreement with it.  Encounter Diagnosis  Name Primary?  . Chronic right shoulder pain Yes    PLAN Call if any problems.  Precautions  discussed.  Continue current medications.   Return to clinic 6 weeks   Electronically Signed Darreld McleanWayne Joany Khatib, MD 5/26/20208:20 AM

## 2018-06-22 ENCOUNTER — Ambulatory Visit: Payer: Self-pay | Admitting: Vascular Surgery

## 2018-06-26 ENCOUNTER — Ambulatory Visit
Admission: RE | Admit: 2018-06-26 | Discharge: 2018-06-26 | Disposition: A | Discharge status: Home / Self Care | Payer: Medicare Other | Source: Ambulatory Visit | Attending: Vascular Surgery | Admitting: Vascular Surgery

## 2018-06-26 DIAGNOSIS — I714 Abdominal aortic aneurysm, without rupture, unspecified: Secondary | ICD-10-CM

## 2018-06-26 MED ORDER — IOPAMIDOL (ISOVUE-370) INJECTION 76%
75.0000 mL | Freq: Once | INTRAVENOUS | Status: AC | PRN
  Administered 2018-06-26: 75 mL via INTRAVENOUS

## 2018-06-29 ENCOUNTER — Ambulatory Visit: Payer: Medicare Other | Admitting: Vascular Surgery

## 2018-07-03 DIAGNOSIS — E1129 Type 2 diabetes mellitus with other diabetic kidney complication: Secondary | ICD-10-CM | POA: Diagnosis not present

## 2018-07-03 DIAGNOSIS — N3001 Acute cystitis with hematuria: Secondary | ICD-10-CM | POA: Diagnosis not present

## 2018-07-10 DIAGNOSIS — R319 Hematuria, unspecified: Secondary | ICD-10-CM | POA: Diagnosis not present

## 2018-07-10 DIAGNOSIS — I714 Abdominal aortic aneurysm, without rupture: Secondary | ICD-10-CM | POA: Diagnosis not present

## 2018-07-10 DIAGNOSIS — E1129 Type 2 diabetes mellitus with other diabetic kidney complication: Secondary | ICD-10-CM | POA: Diagnosis not present

## 2018-07-24 ENCOUNTER — Other Ambulatory Visit: Payer: Self-pay

## 2018-07-24 ENCOUNTER — Ambulatory Visit (INDEPENDENT_AMBULATORY_CARE_PROVIDER_SITE_OTHER): Payer: Medicare Other | Admitting: Orthopaedic Surgery

## 2018-07-24 ENCOUNTER — Encounter: Payer: Self-pay | Admitting: Orthopaedic Surgery

## 2018-07-24 VITALS — BP 127/84 | HR 70 | Temp 97.7°F | Ht 67.0 in | Wt 157.0 lb

## 2018-07-24 DIAGNOSIS — G8929 Other chronic pain: Secondary | ICD-10-CM

## 2018-07-24 DIAGNOSIS — M25511 Pain in right shoulder: Secondary | ICD-10-CM

## 2018-07-24 MED ORDER — HYDROCODONE-ACETAMINOPHEN 5-325 MG PO TABS: ORAL_TABLET | ORAL | 0 refills | Status: DC

## 2018-07-24 NOTE — Progress Notes (Signed)
Patient [name], male DOB:November 22, 1950, 68 y.o. UMP:[id number]  Chief Complaint  Patient presents with  . Shoulder Pain    right     HPI  [name] is a 68 y.o. male who has right shoulder pain chronically.  He has good and bad days.  He has pain with overhead use.  He has no swelling, no redness.  He is doing his exercises but not regularly.  He has no new trauma, no numbness.   Body mass index is 24.59 kg/m.  ROS  Review of Systems  HENT: Negative for congestion.   Respiratory: Negative for cough and shortness of breath.   Cardiovascular: Negative for chest pain and leg swelling.  Endocrine: Positive for cold intolerance.  Musculoskeletal: Positive for arthralgias.  Allergic/Immunologic: Positive for environmental allergies.  All other systems reviewed and are negative.   All other systems reviewed and are negative.  The following is a summary of the past history medically, past history surgically, known current medicines, social history and family history.  This information is gathered electronically by the computer from prior information and documentation.  I review this each visit and have found including this information at this point in the chart is beneficial and informative.    Past Medical History:  Diagnosis Date  . Aortic stenosis   . Arthritis   . CAD (coronary artery disease)    cabg  . Diabetes mellitus    Type II  . Heart murmur   . Hyperlipidemia   . MI (myocardial infarction) ([place])   . S/P AKA (above knee amputation) ([place])     Past Surgical History:  Procedure Laterality Date  . ABOVE KNEE LEG AMPUTATION Left 1972  . AORTIC VALVE REPLACEMENT N/A 02/06/2017   Procedure: AORTIC VALVE REPLACEMENT (AVR);  Surgeon: Gaye Pollack, MD;  Location: [place];  Service: Open Heart Surgery;  Laterality: N/A;  . COLONOSCOPY    . CORONARY ANGIOPLASTY WITH STENT PLACEMENT    . LEG SURGERY     Repair of left leg trauma  . MULTIPLE EXTRACTIONS  WITH ALVEOLOPLASTY N/A 11/29/2016   Procedure: Extraction of tooth #'s 4,00,86,76 and 32 with alveoloplasty and gross debridement of remaining teeth;  Surgeon: Lenn Cal, DDS;  Location: [place];  Service: Oral Surgery;  Laterality: N/A;  . RIGHT/LEFT HEART CATH AND CORONARY ANGIOGRAPHY N/A 11/14/2016   Procedure: RIGHT/LEFT HEART CATH AND CORONARY ANGIOGRAPHY;  Surgeon: Burnell Blanks, MD;  Location: [place] CV LAB;  Service: Cardiovascular;  Laterality: N/A;  . TEE WITHOUT CARDIOVERSION N/A 02/06/2017   Procedure: TRANSESOPHAGEAL ECHOCARDIOGRAM (TEE);  Surgeon: Gaye Pollack, MD;  Location: [place];  Service: Open Heart Surgery;  Laterality: N/A;    Family History  Problem Relation Age of Onset  . Hypertension Mother   . Heart attack Father     Social History Social History   Tobacco Use  . Smoking status: Former Smoker    Packs/day: 0.30    Years: 60.00    Pack years: 18.00    Types: Cigars, Cigarettes    Start date: 01/18/1956    Quit date: 11/25/2015    Years since quitting: 2.6  . Smokeless tobacco: Never Used  Substance Use Topics  . Alcohol use: No    Alcohol/week: 0.0 standard drinks    Frequency: Never    Comment: Occasional  . Drug use: Yes    Types: Marijuana    No Known Allergies  Current Outpatient Medications  Medication Sig Dispense  Refill  . aspirin EC 81 MG tablet Take 81 mg by mouth daily.    [name]. HYDROcodone-acetaminophen (NORCO/VICODIN) 5-325 MG tablet One tablet by mouth every six hours as needed for pain. 56 tablet 0  . linagliptin (TRADJENTA) 5 MG TABS tablet Take 5 mg by mouth daily.    [name]. lisinopril (PRINIVIL,ZESTRIL) 5 MG tablet TAKE 1 TABLET(5 MG) BY MOUTH DAILY 90 tablet 3  . metoprolol tartrate (LOPRESSOR) 25 MG tablet Take 0.5 tablets (12.5 mg total) by mouth 2 (two) times daily. 60 tablet 0  . naproxen (NAPROSYN) 375 MG tablet Take 375 mg by mouth 2 (two) times daily as needed for mild pain.    [name]. ondansetron (ZOFRAN) 8 MG  tablet Take 1 tablet (8 mg total) by mouth every 4 (four) hours as needed. 10 tablet 0  . simvastatin (ZOCOR) 40 MG tablet Take 40 mg by mouth at bedtime.      . tamsulosin (FLOMAX) 0.4 MG CAPS capsule Take 0.4 mg by mouth daily.     [name]. TRESIBA FLEXTOUCH 100 UNIT/ML SOPN FlexTouch Pen Inject 20 Units into the skin every morning.      No current facility-administered medications for this visit.      Physical Exam  Blood pressure 127/84, pulse 70, height 5\' 7"  (1.702 m), weight 157 lb (71.2 kg).  Constitutional: overall normal hygiene, normal nutrition, well developed, normal grooming, normal body habitus. Assistive device:none  Musculoskeletal: gait and station Limp right, muscle tone and strength are normal, no tremors or atrophy is present.  .  Neurological: coordination overall normal.  Deep tendon reflex/nerve stretch intact.  Sensation normal.  Cranial nerves II-XII intact.   Skin:   Normal overall no scars, lesions, ulcers or rashes. No psoriasis.  Psychiatric: Alert and oriented x 3.  Recent memory intact, remote memory unclear.  Normal mood and affect. Well groomed.  Good eye contact.  Cardiovascular: overall no swelling, no varicosities, no edema bilaterally, normal temperatures of the legs and arms, no clubbing, cyanosis and good capillary refill.  Lymphatic: palpation is normal.  All other systems reviewed and are negative   The patient has been educated about the nature of the problem(s) and counseled on treatment options.  The patient appeared to understand what I have discussed and is in agreement with it.  Encounter Diagnosis  Name Primary?  . Chronic right shoulder pain Yes    PLAN Call if any problems.  Precautions discussed.  Continue current medications.   Return to clinic 1 month   I have [place] the [facility] [place] [facility] [facility] web site prior to prescribing narcotic medicine for this patient.   Electronically Signed Darreld McleanWayne  Salam Micucci, MD 7/7/20209:00 AM

## 2018-08-10 ENCOUNTER — Encounter: Payer: Self-pay | Admitting: Vascular Surgery

## 2018-08-10 ENCOUNTER — Encounter: Payer: Self-pay | Admitting: *Deleted

## 2018-08-10 ENCOUNTER — Other Ambulatory Visit: Payer: Self-pay

## 2018-08-10 ENCOUNTER — Ambulatory Visit (INDEPENDENT_AMBULATORY_CARE_PROVIDER_SITE_OTHER): Payer: Medicare Other | Admitting: Vascular Surgery

## 2018-08-10 VITALS — BP 109/71 | HR 56 | Temp 97.3°F | Resp 18 | Ht 67.0 in | Wt 157.2 lb

## 2018-08-10 DIAGNOSIS — I714 Abdominal aortic aneurysm, without rupture, unspecified: Secondary | ICD-10-CM

## 2018-08-10 NOTE — Progress Notes (Signed)
Patient ID: [name], male   DOB: 20-Nov-1950, 68 y.o.   MRN: [id number]  Reason for Consult: Follow-up   Referred by Asencion Noble, MD  Subjective:     HPI:  [name] is a 68 y.o. male with history of coronary artery disease, hyperlipidemia, diabetes and previous MI.  Was found to have large abdominal aneurysm now presents for evaluation.  Has never had back or abdominal pain really.  Remains active walks without limitation.  Does not know of any history of aneurysm does not have any family members with aneurysm.  He denies any history of stroke TIA or amaurosis.  He had a traumatic left leg amputation multiple years ago.  Past Medical History:  Diagnosis Date  . Aortic stenosis   . Arthritis   . CAD (coronary artery disease)    cabg  . Diabetes mellitus    Type II  . Heart murmur   . Hyperlipidemia   . MI (myocardial infarction) ([place])   . S/P AKA (above knee amputation) (HCC)    Family History  Problem Relation Age of Onset  . Hypertension Mother   . Heart attack Father    Past Surgical History:  Procedure Laterality Date  . ABOVE KNEE LEG AMPUTATION Left 1972  . AORTIC VALVE REPLACEMENT N/A 02/06/2017   Procedure: AORTIC VALVE REPLACEMENT (AVR);  Surgeon: Gaye Pollack, MD;  Location: [facility];  Service: Open Heart Surgery;  Laterality: N/A;  . COLONOSCOPY    . CORONARY ANGIOPLASTY WITH STENT PLACEMENT    . LEG SURGERY     Repair of left leg trauma  . MULTIPLE EXTRACTIONS WITH ALVEOLOPLASTY N/A 11/29/2016   Procedure: Extraction of tooth #'s 2,35,57,32 and 32 with alveoloplasty and gross debridement of remaining teeth;  Surgeon: Lenn Cal, DDS;  Location: [facility];  Service: Oral Surgery;  Laterality: N/A;  . RIGHT/LEFT HEART CATH AND CORONARY ANGIOGRAPHY N/A 11/14/2016   Procedure: RIGHT/LEFT HEART CATH AND CORONARY ANGIOGRAPHY;  Surgeon: Burnell Blanks, MD;  Location: [facility];  Service: Cardiovascular;  Laterality: N/A;  . TEE  WITHOUT CARDIOVERSION N/A 02/06/2017   Procedure: TRANSESOPHAGEAL ECHOCARDIOGRAM (TEE);  Surgeon: Gaye Pollack, MD;  Location: [facility];  Service: Open Heart Surgery;  Laterality: N/A;    Short Social History:  Social History   Tobacco Use  . Smoking status: Former Smoker    Packs/day: 0.30    Years: 60.00    Pack years: 18.00    Types: Cigars, Cigarettes    Start date: 01/18/1956    Quit date: 11/25/2015    Years since quitting: 2.7  . Smokeless tobacco: Never Used  Substance Use Topics  . Alcohol use: No    Alcohol/week: 0.0 standard drinks    Frequency: Never    Comment: Occasional    No Known Allergies  Current Outpatient Medications  Medication Sig Dispense Refill  . aspirin EC 81 MG tablet Take 81 mg by mouth daily.    [name] HYDROcodone-acetaminophen (NORCO/VICODIN) 5-325 MG tablet One tablet by mouth every six hours as needed for pain. 56 tablet 0  . linagliptin (TRADJENTA) 5 MG TABS tablet Take 5 mg by mouth daily.    [name] lisinopril (PRINIVIL,ZESTRIL) 5 MG tablet TAKE 1 TABLET(5 MG) BY MOUTH DAILY 90 tablet 3  . metoprolol tartrate (LOPRESSOR) 25 MG tablet Take 0.5 tablets (12.5 mg total) by mouth 2 (two) times daily. 60 tablet 0  . naproxen (NAPROSYN) 375 MG tablet Take 375 mg by  mouth 2 (two) times daily as needed for mild pain.    [name]. ondansetron (ZOFRAN) 8 MG tablet Take 1 tablet (8 mg total) by mouth every 4 (four) hours as needed. 10 tablet 0  . simvastatin (ZOCOR) 40 MG tablet Take 40 mg by mouth at bedtime.      . tamsulosin (FLOMAX) 0.4 MG CAPS capsule Take 0.4 mg by mouth daily.     [name]. TRESIBA FLEXTOUCH 100 UNIT/ML SOPN FlexTouch Pen Inject 20 Units into the skin every morning.      No current facility-administered medications for this visit.     Review of Systems  Constitutional:  Constitutional negative. HENT: HENT negative.  Eyes: Eyes negative.  Respiratory: Respiratory negative.  Cardiovascular: Cardiovascular negative.  GI: Gastrointestinal negative.   Musculoskeletal: Musculoskeletal negative.  Skin: Skin negative.  Neurological: Neurological negative. Hematologic: Hematologic/lymphatic negative.  Psychiatric: Psychiatric negative.        Objective:  Objective   Vitals:   08/10/18 0914  BP: 109/71  Pulse: (!) 56  Resp: 18  Temp: (!) 97.3 F (36.3 C)  SpO2: 97%  Weight: 157 lb 3.2 oz (71.3 kg)  Height: 5\' 7"  (1.702 m)   Body mass index is 24.62 kg/m.  Physical Exam HENT:     Head: Normocephalic.     Mouth/Throat:     Mouth: Mucous membranes are moist.  Eyes:     Pupils: Pupils are equal, round, and reactive to light.  Neck:     Vascular: No carotid bruit.  Cardiovascular:     Rate and Rhythm: Normal rate.     Pulses:          Radial pulses are 2+ on the right side and 2+ on the left side.       Femoral pulses are 2+ on the right side and 2+ on the left side. Pulmonary:     Effort: Pulmonary effort is normal.  Abdominal:     General: Abdomen is flat.     Palpations: Abdomen is soft. There is mass.  Musculoskeletal: Normal range of motion.        General: No swelling.     Comments: There is prostatic present on the left thigh with mid thigh above-knee amputation  Skin:    General: Skin is warm and dry.     Capillary Refill: Capillary refill takes less than 2 seconds.  Neurological:     General: No focal deficit present.     Mental Status: He is alert.  Psychiatric:        Mood and Affect: Mood normal.        Behavior: Behavior normal.        Thought Content: Thought content normal.        Judgment: Judgment normal.     Data: CTA IMPRESSION: VASCULAR:  1. Stable 5.6 cm infrarenal abdominal aortic aneurysm, with fusiform and saccular components. Vascular surgery consultation recommended due to increased risk of rupture for AAA >5.5 cm. This recommendation follows ACR consensus guidelines: White Paper of the ACR Incidental Findings Committee II on Vascular Findings. J Am Coll Radiol 2013;  10:789-794. 2. 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 3. Proximal occlusion of left SFA.  I have independently reviewed the CT scan with the patient and agree with the above findings.     Assessment/Plan:     68 year old male with 5.6 cm aneurysm.  Appears to be endovascular candidate although does have tight left common iliac and external iliac arteries.  I reviewed  the CT scan with him and have discussed his options being no treatment versus open versus endovascular therapies.  We will plan for endovascular aneurysm repair after cardiac clearance.  I discussed risk benefits alternatives he demonstrates very good understanding.     Maeola HarmanBrandon Christopher Reichen Hutzler MD [facility]

## 2018-08-13 ENCOUNTER — Encounter: Payer: Self-pay | Admitting: Vascular Surgery

## 2018-08-14 NOTE — Progress Notes (Signed)
   Nursing staff attempted to call the patient multiple times today for his Virtual Visit without being able to reach him. I tried calling multiple times at his scheduled appointment time without success. Will need to be rescheduled.   Signed, Erma Heritage, PA-C 08/15/2018, 3:55 PM Pager: [phone]

## 2018-08-15 ENCOUNTER — Other Ambulatory Visit: Payer: Self-pay

## 2018-08-15 ENCOUNTER — Encounter: Payer: Medicare Other | Admitting: Student

## 2018-08-15 ENCOUNTER — Telehealth: Payer: Self-pay | Admitting: Student

## 2018-08-15 NOTE — Telephone Encounter (Signed)
   Nursing staff attempted to call the patient multiple times today for his Virtual Visit without being able to reach him. I tried calling multiple times at his scheduled appointment time without success. Will need to be rescheduled.   Signed, Brittany M Strader, PA-C 08/15/2018, 3:55 PM Pager: [phone]    

## 2018-08-20 ENCOUNTER — Emergency Department (HOSPITAL_COMMUNITY): Payer: Medicare Other

## 2018-08-20 ENCOUNTER — Encounter (HOSPITAL_COMMUNITY): Payer: Self-pay | Admitting: Emergency Medicine

## 2018-08-20 ENCOUNTER — Other Ambulatory Visit: Payer: Self-pay

## 2018-08-20 ENCOUNTER — Inpatient Hospital Stay (HOSPITAL_COMMUNITY)
Admission: EM | Admit: 2018-08-20 | Discharge: 2018-08-24 | DRG: 369 | Disposition: A | Discharge status: Home / Self Care | Payer: Medicare Other | Attending: Internal Medicine | Admitting: Internal Medicine

## 2018-08-20 DIAGNOSIS — R110 Nausea: Secondary | ICD-10-CM | POA: Diagnosis not present

## 2018-08-20 DIAGNOSIS — Z951 Presence of aortocoronary bypass graft: Secondary | ICD-10-CM

## 2018-08-20 DIAGNOSIS — N400 Enlarged prostate without lower urinary tract symptoms: Secondary | ICD-10-CM | POA: Diagnosis present

## 2018-08-20 DIAGNOSIS — Z8249 Family history of ischemic heart disease and other diseases of the circulatory system: Secondary | ICD-10-CM | POA: Diagnosis not present

## 2018-08-20 DIAGNOSIS — Z794 Long term (current) use of insulin: Secondary | ICD-10-CM | POA: Diagnosis not present

## 2018-08-20 DIAGNOSIS — K922 Gastrointestinal hemorrhage, unspecified: Secondary | ICD-10-CM | POA: Diagnosis present

## 2018-08-20 DIAGNOSIS — Z7982 Long term (current) use of aspirin: Secondary | ICD-10-CM | POA: Diagnosis not present

## 2018-08-20 DIAGNOSIS — E871 Hypo-osmolality and hyponatremia: Secondary | ICD-10-CM | POA: Diagnosis present

## 2018-08-20 DIAGNOSIS — R109 Unspecified abdominal pain: Secondary | ICD-10-CM

## 2018-08-20 DIAGNOSIS — E860 Dehydration: Secondary | ICD-10-CM | POA: Diagnosis not present

## 2018-08-20 DIAGNOSIS — K449 Diaphragmatic hernia without obstruction or gangrene: Secondary | ICD-10-CM | POA: Diagnosis present

## 2018-08-20 DIAGNOSIS — E1151 Type 2 diabetes mellitus with diabetic peripheral angiopathy without gangrene: Secondary | ICD-10-CM | POA: Diagnosis not present

## 2018-08-20 DIAGNOSIS — K226 Gastro-esophageal laceration-hemorrhage syndrome: Principal | ICD-10-CM | POA: Diagnosis present

## 2018-08-20 DIAGNOSIS — E1165 Type 2 diabetes mellitus with hyperglycemia: Secondary | ICD-10-CM | POA: Diagnosis not present

## 2018-08-20 DIAGNOSIS — Z20828 Contact with and (suspected) exposure to other viral communicable diseases: Secondary | ICD-10-CM | POA: Diagnosis present

## 2018-08-20 DIAGNOSIS — Z87891 Personal history of nicotine dependence: Secondary | ICD-10-CM

## 2018-08-20 DIAGNOSIS — I2510 Atherosclerotic heart disease of native coronary artery without angina pectoris: Secondary | ICD-10-CM | POA: Diagnosis present

## 2018-08-20 DIAGNOSIS — I10 Essential (primary) hypertension: Secondary | ICD-10-CM | POA: Diagnosis not present

## 2018-08-20 DIAGNOSIS — K2971 Gastritis, unspecified, with bleeding: Secondary | ICD-10-CM | POA: Diagnosis not present

## 2018-08-20 DIAGNOSIS — R001 Bradycardia, unspecified: Secondary | ICD-10-CM | POA: Diagnosis present

## 2018-08-20 DIAGNOSIS — E785 Hyperlipidemia, unspecified: Secondary | ICD-10-CM | POA: Diagnosis not present

## 2018-08-20 DIAGNOSIS — I513 Intracardiac thrombosis, not elsewhere classified: Secondary | ICD-10-CM | POA: Diagnosis present

## 2018-08-20 DIAGNOSIS — K210 Gastro-esophageal reflux disease with esophagitis: Secondary | ICD-10-CM | POA: Diagnosis present

## 2018-08-20 DIAGNOSIS — Z952 Presence of prosthetic heart valve: Secondary | ICD-10-CM | POA: Diagnosis not present

## 2018-08-20 DIAGNOSIS — K209 Esophagitis, unspecified: Secondary | ICD-10-CM | POA: Diagnosis not present

## 2018-08-20 DIAGNOSIS — I252 Old myocardial infarction: Secondary | ICD-10-CM | POA: Diagnosis not present

## 2018-08-20 DIAGNOSIS — Z79899 Other long term (current) drug therapy: Secondary | ICD-10-CM

## 2018-08-20 DIAGNOSIS — R112 Nausea with vomiting, unspecified: Secondary | ICD-10-CM | POA: Diagnosis not present

## 2018-08-20 DIAGNOSIS — I714 Abdominal aortic aneurysm, without rupture: Secondary | ICD-10-CM | POA: Diagnosis not present

## 2018-08-20 DIAGNOSIS — Z89612 Acquired absence of left leg above knee: Secondary | ICD-10-CM | POA: Diagnosis not present

## 2018-08-20 DIAGNOSIS — K920 Hematemesis: Secondary | ICD-10-CM

## 2018-08-20 DIAGNOSIS — R1013 Epigastric pain: Secondary | ICD-10-CM | POA: Diagnosis not present

## 2018-08-20 DIAGNOSIS — Z03818 Encounter for observation for suspected exposure to other biological agents ruled out: Secondary | ICD-10-CM | POA: Diagnosis not present

## 2018-08-20 DIAGNOSIS — R0902 Hypoxemia: Secondary | ICD-10-CM | POA: Diagnosis not present

## 2018-08-20 HISTORY — DX: Abdominal aortic aneurysm, without rupture: I71.4

## 2018-08-20 HISTORY — DX: Abdominal aortic aneurysm, without rupture, unspecified: I71.40

## 2018-08-20 LAB — PROTIME-INR
INR: 1 (ref 0.8–1.2)
Prothrombin Time: 13.3 seconds (ref 11.4–15.2)

## 2018-08-20 LAB — COMPREHENSIVE METABOLIC PANEL
ALT: 16 U/L (ref 0–44)
AST: 22 U/L (ref 15–41)
Albumin: 4.5 g/dL (ref 3.5–5.0)
Alkaline Phosphatase: 71 U/L (ref 38–126)
Anion gap: 15 (ref 5–15)
BUN: 11 mg/dL (ref 8–23)
CO2: 19 mmol/L — ABNORMAL LOW (ref 22–32)
Calcium: 9.5 mg/dL (ref 8.9–10.3)
Chloride: 100 mmol/L (ref 98–111)
Creatinine, Ser: 0.99 mg/dL (ref 0.61–1.24)
GFR calc Af Amer: >60 mL/min (ref >60)
GFR calc non Af Amer: >60 mL/min (ref >60)
Glucose, Bld: 209 mg/dL — ABNORMAL HIGH (ref 70–99)
Potassium: 4 mmol/L (ref 3.5–5.1)
Sodium: 134 mmol/L — ABNORMAL LOW (ref 135–145)
Total Bilirubin: 0.7 mg/dL (ref 0.3–1.2)
Total Protein: 8.4 g/dL — ABNORMAL HIGH (ref 6.5–8.1)

## 2018-08-20 LAB — CBC WITH DIFFERENTIAL/PLATELET
Abs Immature Granulocytes: 0.1 10*3/uL — ABNORMAL HIGH (ref 0.00–0.07)
Basophils Absolute: 0 10*3/uL (ref 0.0–0.1)
Basophils Relative: 0 %
Eosinophils Absolute: 0 10*3/uL (ref 0.0–0.5)
Eosinophils Relative: 0 %
HCT: 51 % (ref 39.0–52.0)
Hemoglobin: 16.5 g/dL (ref 13.0–17.0)
Immature Granulocytes: 1 %
Lymphocytes Relative: 4 %
Lymphs Abs: 0.6 10*3/uL — ABNORMAL LOW (ref 0.7–4.0)
MCH: 29.2 pg (ref 26.0–34.0)
MCHC: 32.4 g/dL (ref 30.0–36.0)
MCV: 90.3 fL (ref 80.0–100.0)
Monocytes Absolute: 0.6 10*3/uL (ref 0.1–1.0)
Monocytes Relative: 4 %
Neutro Abs: 12 10*3/uL — ABNORMAL HIGH (ref 1.7–7.7)
Neutrophils Relative %: 91 %
Platelets: 281 10*3/uL (ref 150–400)
RBC: 5.65 MIL/uL (ref 4.22–5.81)
RDW: 14.2 % (ref 11.5–15.5)
WBC: 13.2 10*3/uL — ABNORMAL HIGH (ref 4.0–10.5)
nRBC: 0 % (ref 0.0–0.2)

## 2018-08-20 LAB — TYPE AND SCREEN
ABO/RH(D): A POS
Antibody Screen: NEGATIVE

## 2018-08-20 LAB — TROPONIN I (HIGH SENSITIVITY)
Troponin I (High Sensitivity): 6 ng/L (ref <18)
Troponin I (High Sensitivity): 6 ng/L (ref <18)

## 2018-08-20 LAB — LIPASE, BLOOD: Lipase: 27 U/L (ref 11–51)

## 2018-08-20 LAB — SARS CORONAVIRUS 2 BY RT PCR (HOSPITAL ORDER, PERFORMED IN ~~LOC~~ HOSPITAL LAB): SARS Coronavirus 2: NEGATIVE

## 2018-08-20 LAB — POC OCCULT BLOOD, ED: Fecal Occult Bld: POSITIVE — AB

## 2018-08-20 MED ORDER — IOHEXOL 350 MG/ML SOLN
100.0000 mL | Freq: Once | INTRAVENOUS | Status: AC | PRN
  Administered 2018-08-20: 100 mL via INTRAVENOUS

## 2018-08-20 MED ORDER — SODIUM CHLORIDE 0.9 % IV BOLUS
500.0000 mL | Freq: Once | INTRAVENOUS | Status: AC
  Administered 2018-08-20: 500 mL via INTRAVENOUS

## 2018-08-20 MED ORDER — ONDANSETRON HCL 4 MG/2ML IJ SOLN
4.0000 mg | Freq: Once | INTRAMUSCULAR | Status: AC
  Administered 2018-08-20: 4 mg via INTRAVENOUS
  Filled 2018-08-20: qty 2

## 2018-08-20 MED ORDER — MORPHINE SULFATE (PF) 4 MG/ML IV SOLN
4.0000 mg | Freq: Once | INTRAVENOUS | Status: AC
  Administered 2018-08-20: 4 mg via INTRAVENOUS
  Filled 2018-08-20: qty 1

## 2018-08-20 MED ORDER — HYDROMORPHONE HCL 1 MG/ML IJ SOLN
0.5000 mg | Freq: Once | INTRAMUSCULAR | Status: AC
  Administered 2018-08-20: 0.5 mg via INTRAVENOUS
  Filled 2018-08-20: qty 1

## 2018-08-20 MED ORDER — PANTOPRAZOLE SODIUM 40 MG IV SOLR
40.0000 mg | Freq: Once | INTRAVENOUS | Status: AC
  Administered 2018-08-20: 40 mg via INTRAVENOUS
  Filled 2018-08-20: qty 40

## 2018-08-20 NOTE — ED Provider Notes (Signed)
[facility] EMERGENCY DEPARTMENT Provider Note   CSN: [id number] Arrival date & time: 08/20/18  1811    History   Chief Complaint Chief Complaint  Patient presents with  . Emesis    HPI [name] is a 68 y.o. male with history of CAD, diabetes, known AAA last measured at 5.6 cm with scheduled repair later this month who presents with severe, sudden abdominal pain that began around 2 PM.  He has had associated hematemesis.  He reports he woke up feeling fine.  He has not eaten anything today.  Someone who knows him found him lying on his front porch and EMS was called.  Patient denies any chest pain or shortness of breath, fever, urinary symptoms, diarrhea, bloody stools.  Patient denies drinking alcohol regularly.  He states he only drinks occasionally.     HPI  Past Medical History:  Diagnosis Date  . Abdominal aortic aneurysm (AAA) (HCC)   . Aortic stenosis   . Arthritis   . CAD (coronary artery disease)    cabg  . Diabetes mellitus    Type II  . Heart murmur   . Hyperlipidemia   . MI (myocardial infarction) (H[facility])   . S/P AKA (above knee amputation) [facility](HCC)     Patient Active Problem List   Diagnosis Date Noted  . H/O aortic valve replacement 11/13/2017  . 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 Severe aortic stenosis   . Aortic stenosis 04/29/2013  . Aortic regurgitation 04/29/2013  . Murmur, cardiac 11/22/2010  . Hyperlipidemia 10/24/2008  . Coronary atherosclerosis 10/24/2008  . CAROTID BRUIT 10/24/2008  . Diabetes mellitus without complication (HCC) 10/21/2008    Past Surgical History:  Procedure Laterality Date  . ABOVE KNEE LEG AMPUTATION Left 1972  . AORTIC VALVE REPLACEMENT N/A 02/06/2017   Procedure: AORTIC VALVE REPLACEMENT (AVR);  Surgeon: Alleen BorneBartle, Bryan K, MD;  [facility];  Service: Open Heart Surgery;  Laterality: N/A;  . COLONOSCOPY    . CORONARY ANGIOPLASTY WITH STENT PLACEMENT    . LEG SURGERY     Repair of left leg trauma  . MULTIPLE  EXTRACTIONS WITH ALVEOLOPLASTY N/A 11/29/2016   Procedure: Extraction of tooth #'s 4,09,81,191,24,25,26 and 32 with alveoloplasty and gross debridement of remaining teeth;  Surgeon: Charlynne PanderKulinski, Ronald F, DDS;  Location: [facility] OR;  Service: Oral Surgery;  Laterality: N/A;  . RIGHT/LEFT HEART CATH AND CORONARY ANGIOGRAPHY N/A 11/14/2016   Procedure: RIGHT/LEFT HEART CATH AND CORONARY ANGIOGRAPHY;  Surgeon: Kathleene HazelMcAlhany, Christopher D, MD;  Location: [facility] INVASIVE [facility];  Service: Cardiovascular;  Laterality: N/A;  . TEE WITHOUT CARDIOVERSION N/A 02/06/2017   Procedure: TRANSESOPHAGEAL ECHOCARDIOGRAM (TEE);  Surgeon: Alleen BorneBartle, Bryan K, MD;  [facility];  Service: Open Heart Surgery;  Laterality: N/A;        Home Medications    Prior to Admission medications   Medication Sig Start Date End Date Taking? Authorizing Provider  aspirin EC 81 MG tablet Take 81 mg by mouth daily.    [provider]  HYDROcodone-acetaminophen (NORCO/VICODIN) 5-325 MG tablet One tablet by mouth every six hours as needed for pain. 07/24/18   Darreld McleanKeeling, Wayne, MD  linagliptin (TRADJENTA) 5 MG TABS tablet Take 5 mg by mouth daily.    [provider]  lisinopril (PRINIVIL,ZESTRIL) 5 MG tablet TAKE 1 TABLET(5 MG) BY MOUTH DAILY 04/09/18   Dyann KiefLenze, Michele M, PA-C  metoprolol tartrate (LOPRESSOR) 25 MG tablet Take 0.5 tablets (12.5 mg total) by mouth 2 (two) times daily. 11/13/17  Lonia Blood, MD  naproxen (NAPROSYN) 375 MG tablet Take 375 mg by mouth 2 (two) times daily as needed for mild pain.    [provider]  ondansetron (ZOFRAN) 8 MG tablet Take 1 tablet (8 mg total) by mouth every 4 (four) hours as needed. 05/12/18   Donnetta Hutching, MD  simvastatin (ZOCOR) 40 MG tablet Take 40 mg by mouth at bedtime.      [provider]  tamsulosin (FLOMAX) 0.4 MG CAPS capsule Take 0.4 mg by mouth daily.     [provider]  TRESIBA FLEXTOUCH 100 UNIT/ML SOPN FlexTouch Pen Inject 20 Units into the skin every  morning.  12/05/17   [provider]    Family History Family History  Problem Relation Age of Onset  . Hypertension Mother   . Heart attack Father     Social History Social History   Tobacco Use  . Smoking status: Former Smoker    Packs/day: 0.30    Years: 60.00    Pack years: 18.00    Types: Cigars, Cigarettes    Start date: 01/18/1956    Quit date: 11/25/2015    Years since quitting: 2.7  . Smokeless tobacco: Never Used  Substance Use Topics  . Alcohol use: Yes    Alcohol/week: 0.0 standard drinks    Frequency: Never    Comment: Occasional  . Drug use: Yes    Types: Marijuana     Allergies   Patient has no known allergies.   Review of Systems Review of Systems  Constitutional: Negative for chills and fever.  HENT: Negative for facial swelling and sore throat.   Respiratory: Negative for shortness of breath.   Cardiovascular: Negative for chest pain.  Gastrointestinal: Positive for abdominal pain, nausea and vomiting. Negative for blood in stool.  Genitourinary: Negative for dysuria.  Musculoskeletal: Negative for back pain.  Skin: Negative for rash and wound.  Neurological: Negative for headaches.  Psychiatric/Behavioral: The patient is not nervous/anxious.      Physical Exam Updated Vital Signs BP (!) 167/85   Pulse 71   Temp 98 F (36.7 C) (Oral)   Resp 14   Ht 5\' 10"  (1.778 m)   Wt 72.6 kg   SpO2 96%   BMI 22.96 kg/m   Physical Exam Vitals signs and nursing note reviewed.  Constitutional:      General: He is not in acute distress.    Appearance: He is well-developed. He is not diaphoretic.  HENT:     Head: Normocephalic and atraumatic.     Mouth/Throat:     Pharynx: No oropharyngeal exudate.  Eyes:     General: No scleral icterus.       Right eye: No discharge.        Left eye: No discharge.     Conjunctiva/sclera: Conjunctivae normal.     Pupils: Pupils are equal, round, and reactive to light.  Neck:     Musculoskeletal:  Normal range of motion and neck supple.     Thyroid: No thyromegaly.  Cardiovascular:     Rate and Rhythm: Regular rhythm. Bradycardia present.     Heart sounds: Normal heart sounds. No murmur. No friction rub. No gallop.      Comments: Cannot assess symmetry of distal pulses as patient is status post left AKA Pulmonary:     Effort: Pulmonary effort is normal. No respiratory distress.     Breath sounds: Normal breath sounds. No stridor. No wheezing or rales.  Abdominal:  General: Bowel sounds are normal. There is no distension.     Palpations: Abdomen is soft.     Tenderness: There is generalized abdominal tenderness. There is no guarding or rebound.  Lymphadenopathy:     Cervical: No cervical adenopathy.  Skin:    General: Skin is warm and dry.     Coloration: Skin is not pale.     Findings: No rash.  Neurological:     Mental Status: He is alert.     Coordination: Coordination normal.      ED Treatments / Results  Labs (all labs ordered are listed, but only abnormal results are displayed) Labs Reviewed  COMPREHENSIVE METABOLIC PANEL - Abnormal; Notable for the following components:      Result Value   Sodium 134 (*)    CO2 19 (*)    Glucose, Bld 209 (*)    Total Protein 8.4 (*)    All other components within normal limits  CBC WITH DIFFERENTIAL/PLATELET - Abnormal; Notable for the following components:   WBC 13.2 (*)    Neutro Abs 12.0 (*)    Lymphs Abs 0.6 (*)    Abs Immature Granulocytes 0.10 (*)    All other components within normal limits  POC OCCULT BLOOD, ED - Abnormal; Notable for the following components:   Fecal Occult Bld POSITIVE (*)    All other components within normal limits  SARS CORONAVIRUS 2 (HOSPITAL ORDER, PERFORMED IN [facility])  LIPASE, BLOOD  PROTIME-INR  POCT GASTRIC OCCULT BLOOD (1-CARD TO LAB)  TYPE AND SCREEN  TROPONIN I (HIGH SENSITIVITY)  TROPONIN I (HIGH SENSITIVITY)    EKG None  Radiology Dg Chest 1 View   Result Date: 08/20/2018 CLINICAL DATA:  Severe abdominal pain EXAM: CHEST  1 VIEW COMPARISON:  11/10/2017 FINDINGS: Cardiac shadow is within normal limits. Postsurgical changes are again seen. Mild interstitial changes are noted bilaterally without focal infiltrate or sizable effusion. No bony abnormality is noted. IMPRESSION: No acute abnormality seen. Electronically Signed   By: Alcide CleverMark  Lukens M.D.   On: 08/20/2018 19:42   Ct Angio Abd/pel W And/or Wo Contrast  Result Date: 08/20/2018 CLINICAL DATA:  Abdominal pain and hematemesis for several hours, history of known abdominal aneurysm. EXAM: CTA ABDOMEN AND PELVIS WITH CONTRAST TECHNIQUE: Multidetector CT imaging of the abdomen and pelvis was performed using the standard protocol during bolus administration of intravenous contrast. Multiplanar reconstructed images and MIPs were obtained and reviewed to evaluate the vascular anatomy. CONTRAST:  100mL OMNIPAQUE IOHEXOL 350 MG/ML SOLN COMPARISON:  06/26/2018 FINDINGS: VASCULAR Aorta: Abdominal aorta demonstrates evidence of the known infrarenal aortic aneurysm. It measures approximately 5.4 cm in greatest dimension using similar measurements as that on the prior exam. Considerable mural thrombus is noted. Mild irregularity of the mural thrombus is noted best seen on image number 82 of series 4. This may be related to some swirling of the contrast within the lumen. No findings to suggest impending rupture are identified at this time. Tapering at the level of the aortic bifurcation is noted similar to that seen on the prior exam. Celiac: Mild atherosclerotic calcifications are noted with 50% stenosis at the origin. The more distal branches are within normal limits. SMA: Patent without evidence of aneurysm, dissection, vasculitis or significant stenosis. Renals: Both renal arteries are patent without evidence of aneurysm, dissection, vasculitis, fibromuscular dysplasia or significant stenosis. IMA: The IMA is patent  just beyond its origin related to collateralization. The orifice of the IMA is occluded secondary  to the mural thrombus anteriorly. Iliacs and outflow: Atherosclerotic changes are noted within the iliac arteries bilaterally without aneurysmal dilatation. The superficial femoral artery on the left is occluded just beyond its origin. The left internal iliac artery is again occluded. Veins: No venous abnormality is noted. Review of the MIP images confirms the above findings. NON-VASCULAR Lower chest: Mild scarring is noted in the bases bilaterally. No focal infiltrate is seen. Hepatobiliary: No focal liver abnormality is seen. No gallstones, gallbladder wall thickening, or biliary dilatation. Pancreas: Unremarkable. No pancreatic ductal dilatation or surrounding inflammatory changes. Spleen: Normal in size without focal abnormality. Adrenals/Urinary Tract: Stable right adrenal nodule is again seen. Left adrenal gland is unremarkable. Kidneys demonstrate no obstructive changes. The bladder is well distended. Stomach/Bowel: The appendix is within normal limits. Colon is decompressed. No obstructive or inflammatory changes of the small bowel are seen. The stomach is within normal limits with the exception of a sliding-type hiatal hernia. Lymphatic: No significant lymphadenopathy is identified. Reproductive: Prostate is unremarkable. Other: No abdominal wall hernia or abnormality. No abdominopelvic ascites. Musculoskeletal: Atrophic changes are noted in the musculature of the left thigh consistent with the patient's known history of prior left AKA. Degenerative changes of lumbar spine are seen. IMPRESSION: VASCULAR Stable size of the abdominal aortic aneurysm. Slight variation in the measurement is noted although likely related to technical variation in the imaging. 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 NON-VASCULAR Chronic changes as described above.  No acute abnormality noted. Electronically Signed   By: Inez Catalina M.D.   On: 08/20/2018 19:41    Procedures Procedures (including critical care time)  Medications Ordered in ED Medications  morphine 4 MG/ML injection 4 mg (4 mg Intravenous Given 08/20/18 1929)  iohexol (OMNIPAQUE) 350 MG/ML injection 100 mL (100 mLs Intravenous Contrast Given 08/20/18 1857)  HYDROmorphone (DILAUDID) injection 0.5 mg (0.5 mg Intravenous Given 08/20/18 2124)  sodium chloride 0.9 % bolus 500 mL (0 mLs Intravenous Stopped 08/20/18 2143)  ondansetron (ZOFRAN) injection 4 mg (4 mg Intravenous Given 08/20/18 2124)  pantoprazole (PROTONIX) injection 40 mg (40 mg Intravenous Given 08/20/18 2143)     Initial Impression / Assessment and Plan / ED Course  I have reviewed the triage vital signs and the nursing notes.  Pertinent labs & imaging results that were available during my care of the patient were reviewed by me and considered in my medical decision making (see chart for details).        Patient presenting with abdominal pain and hematemesis.  Patient reports 7-8 episodes of emesis with the last several being hematemesis.  Patient takes aspirin.  He does not drink alcohol regularly.  CT abdomen pelvis with angiogram shows stable AAA and no acute nonvascular findings.  Suspect gastritis.  Leukocytosis 13.2.  BUN within normal limits.  Hematemesis confirmed by gastric occult.  Chest x-ray is clear.  Pain controlled finally with 0.5 mg of morphine.  Patient placed on 2 L as he was desaturating his oxygen to about 88% while sleeping only.  He was snoring at this time.  Protonix and IV fluids initiated.  I discussed patient case with Dr. Clearence Ped with [facility] who accepts patient for admission.  I appreciate her assistance with the patient.  Patient also evaluated by my attending, Dr.  Roderic Palau, who guided the patient's  management and agrees with plan.  Final Clinical Impressions(s) / ED Diagnoses   Final diagnoses:  Epigastric pain  Hematemesis with nausea    ED Discharge Orders    None       Emi HolesLaw, Berk Pilot M, PA-C 08/20/18 2214    Bethann BerkshireZammit, Joseph, MD 08/24/18 [phone]

## 2018-08-20 NOTE — ED Triage Notes (Signed)
Pt is poor historian and difficult to assess, but gather pt has been having abd pain with vomiting and hematemesis since 2pm. He has not eaten today but woke up feeling fine and drove to town to pay his bills when it suddenly attacked upon his arrival home. Someone who knows him found him on the front porch lying down and is not sure if he fell, but he requested for them to call 911.

## 2018-08-20 NOTE — ED Notes (Signed)
ED TO INPATIENT HANDOFF REPORT  ED Nurse Name and Phone #: [phone]  S Name/Age/Gender [name] 68 y.o. male Room/Bed: APA01/APA01  Code Status   Code Status: Prior  Home/SNF/Other Home Patient oriented to: situation Is this baseline? Yes   Triage Complete: Triage complete  Chief Complaint Emesis; Nausea  Triage Note Pt is poor historian and difficult to assess, but gather pt has been having abd pain with vomiting and hematemesis since 2pm. He has not eaten today but woke up feeling fine and drove to town to pay his bills when it suddenly attacked upon his arrival home. Someone who knows him found him on the front porch lying down and is not sure if he fell, but he requested for them to call 911.    Allergies No Known Allergies  Level of Care/Admitting Diagnosis ED Disposition    ED Disposition Condition Comment   Admit  The patient appears reasonably stabilized for admission considering the current resources, flow, and capabilities available in the ED at this time, and I doubt any other [facility] requiring further screening and/or treatment in the ED prior to admission is  present.       B Medical/Surgery History Past Medical History:  Diagnosis Date  . Abdominal aortic aneurysm (AAA) ([place])   . Aortic stenosis   . Arthritis   . CAD (coronary artery disease)    cabg  . Diabetes mellitus    Type II  . Heart murmur   . Hyperlipidemia   . MI (myocardial infarction) ([place])   . S/P AKA (above knee amputation) ([place])    Past Surgical History:  Procedure Laterality Date  . ABOVE KNEE LEG AMPUTATION Left 1972  . AORTIC VALVE REPLACEMENT N/A 02/06/2017   Procedure: AORTIC VALVE REPLACEMENT (AVR);  Surgeon: Gaye Pollack, MD;  Location: [place];  Service: Open Heart Surgery;  Laterality: N/A;  . COLONOSCOPY    . CORONARY ANGIOPLASTY WITH STENT PLACEMENT    . LEG SURGERY     Repair of left leg trauma  . MULTIPLE EXTRACTIONS WITH ALVEOLOPLASTY N/A 11/29/2016    Procedure: Extraction of tooth #'s 3,23,55,73 and 32 with alveoloplasty and gross debridement of remaining teeth;  Surgeon: Lenn Cal, DDS;  Location: [place];  Service: Oral Surgery;  Laterality: N/A;  . RIGHT/LEFT HEART CATH AND CORONARY ANGIOGRAPHY N/A 11/14/2016   Procedure: RIGHT/LEFT HEART CATH AND CORONARY ANGIOGRAPHY;  Surgeon: Burnell Blanks, MD;  Location: [place] [facility];  Service: Cardiovascular;  Laterality: N/A;  . TEE WITHOUT CARDIOVERSION N/A 02/06/2017   Procedure: TRANSESOPHAGEAL ECHOCARDIOGRAM (TEE);  Surgeon: Gaye Pollack, MD;  Location: [place];  Service: Open Heart Surgery;  Laterality: N/A;     A IV Location/Drains/Wounds Patient Lines/Drains/Airways Status   Active Line/Drains/Airways    Name:   Placement date:   Placement time:   Site:   Days:   Peripheral IV 08/20/18 Right Antecubital   08/20/18    1851    Antecubital   less than 1          Intake/Output Last 24 hours No intake or output data in the 24 hours ending 08/20/18 2208  Labs/Imaging Results for orders placed or performed during the hospital encounter of 08/20/18 (from the past 48 hour(s))  Comprehensive metabolic panel     Status: Abnormal   Collection Time: 08/20/18  7:24 PM  Result Value Ref Range   Sodium 134 (L) 135 - 145 mmol/L   Potassium 4.0 3.5 - 5.1  mmol/L   Chloride 100 98 - 111 mmol/L   CO2 19 (L) 22 - 32 mmol/L   Glucose, Bld 209 (H) 70 - 99 mg/dL   BUN 11 8 - 23 mg/dL   Creatinine, Ser 1.61 0.61 - 1.24 mg/dL   Calcium 9.5 8.9 - 09.6 mg/dL   Total Protein 8.4 (H) 6.5 - 8.1 g/dL   Albumin 4.5 3.5 - 5.0 g/dL   AST 22 15 - 41 U/L   ALT 16 0 - 44 U/L   Alkaline Phosphatase 71 38 - 126 U/L   Total Bilirubin 0.7 0.3 - 1.2 mg/dL   GFR calc non Af Amer >60 >60 mL/min   GFR calc Af Amer >60 >60 mL/min   Anion gap 15 5 - 15    Comment: Performed at [facility], [address], [place], [place] [address]  Lipase, blood     Status: None   Collection Time: 08/20/18   7:24 PM  Result Value Ref Range   Lipase 27 11 - 51 U/L    Comment: Performed at [facility], [address], [place], [place] [address]  CBC with Differential     Status: Abnormal   Collection Time: 08/20/18  7:24 PM  Result Value Ref Range   WBC 13.2 (H) 4.0 - 10.5 K/uL   RBC 5.65 4.22 - 5.81 MIL/uL   Hemoglobin 16.5 13.0 - 17.0 g/dL   HCT 14.7 82.9 - 56.2 %   MCV 90.3 80.0 - 100.0 fL   MCH 29.2 26.0 - 34.0 pg   MCHC 32.4 30.0 - 36.0 g/dL   RDW 13.0 86.5 - 78.4 %   Platelets 281 150 - 400 K/uL   nRBC 0.0 0.0 - 0.2 %   Neutrophils Relative % 91 %   Neutro Abs 12.0 (H) 1.7 - 7.7 K/uL   Lymphocytes Relative 4 %   Lymphs Abs 0.6 (L) 0.7 - 4.0 K/uL   Monocytes Relative 4 %   Monocytes Absolute 0.6 0.1 - 1.0 K/uL   Eosinophils Relative 0 %   Eosinophils Absolute 0.0 0.0 - 0.5 K/uL   Basophils Relative 0 %   Basophils Absolute 0.0 0.0 - 0.1 K/uL   Immature Granulocytes 1 %   Abs Immature Granulocytes 0.10 (H) 0.00 - 0.07 K/uL    Comment: Performed at [facility], [address] [place], [place] [address]  Troponin I (High Sensitivity)     Status: None   Collection Time: 08/20/18  7:24 PM  Result Value Ref Range   Troponin I (High Sensitivity) 6 <18 ng/L    Comment: (NOTE) Elevated high sensitivity troponin I (hsTnI) values and significant  changes across serial measurements may suggest ACS but many other  chronic and acute conditions are known to elevate hsTnI results.  Refer to the "Links" section for chest pain algorithms and additional  guidance. Performed at [facility], [address], [place], [place] [address]   Type and screen [facility]     Status: None   Collection Time: 08/20/18  7:24 PM  Result Value Ref Range   ABO/RH(D) A POS    Antibody Screen NEG    Sample Expiration      08/23/2018,2359 Performed at [facility], [address] [place], [place] [address]   Protime-INR     Status: None   Collection Time: 08/20/18  7:24 PM  Result Value Ref  Range   Prothrombin Time 13.3 11.4 - 15.2 seconds   INR 1.0 0.8 - 1.2  Comment: (NOTE) INR goal varies based on device and disease states. Performed at [facility], [address] [place], [place] [address]   SARS Coronavirus 2 [facility](Hospital order, Performed in [facility] hospital lab)     Status: None   Collection Time: 08/20/18  7:45 PM  Result Value Ref Range   SARS Coronavirus 2 NEGATIVE NEGATIVE    Comment: (NOTE) If result is NEGATIVE SARS-CoV-2 target nucleic acids are NOT DETECTED. The SARS-CoV-2 RNA is generally detectable in upper and lower  respiratory specimens during the acute phase of infection. The lowest  concentration of SARS-CoV-2 viral copies this assay can detect is 250  copies / mL. A negative result does not preclude SARS-CoV-2 infection  and should not be used as the sole basis for treatment or other  patient management decisions.  A negative result may occur with  improper specimen collection / handling, submission of specimen other  than nasopharyngeal swab, presence of viral mutation(s) within the  areas targeted by this assay, and inadequate number of viral copies  (<250 copies / mL). A negative result must be combined with clinical  observations, patient history, and epidemiological information. If result is POSITIVE SARS-CoV-2 target nucleic acids are DETECTED. The SARS-CoV-2 RNA is generally detectable in upper and lower  respiratory specimens dur ing the acute phase of infection.  Positive  results are indicative of active infection with SARS-CoV-2.  Clinical  correlation with patient history and other diagnostic information is  necessary to determine patient infection status.  Positive results do  not rule out bacterial infection or co-infection with other viruses. If result is PRESUMPTIVE POSTIVE SARS-CoV-2 nucleic acids MAY BE PRESENT.   A presumptive positive result was obtained on the submitted specimen  and confirmed on repeat testing.  While  2019 novel coronavirus  (SARS-CoV-2) nucleic acids may be present in the submitted sample  additional confirmatory testing may be necessary for epidemiological  and / or clinical management purposes  to differentiate between  SARS-CoV-2 and other Sarbecovirus currently known to infect humans.  If clinically indicated additional testing with an alternate test  methodology [id number]) is advised. The SARS-CoV-2 RNA is generally  detectable in upper and lower respiratory sp ecimens during the acute  phase of infection. The expected result is Negative. Fact Sheet for Patients:  BoilerBrush.com.cyhttps://www.fda.gov/media/136312/download Fact Sheet for Healthcare Providers: https://pope.com/https://www.fda.gov/media/136313/download This test is not yet approved or cleared by the [place] FDA and has been authorized for detection and/or diagnosis of SARS-CoV-2 by FDA under an Emergency Use Authorization (EUA).  This EUA will remain in effect (meaning this test can be used) for the duration of the COVID-19 declaration under Section 564(b)(1) of the Act, 21 U.S.C. section 360bbb-3(b)(1), unless the authorization is terminated or revoked sooner. Performed at [facility], [address] [place], [place] [address]   Troponin I (High Sensitivity)     Status: None   Collection Time: 08/20/18  8:37 PM  Result Value Ref Range   Troponin I (High Sensitivity) 6 <18 ng/L    Comment: (NOTE) Elevated high sensitivity troponin I (hsTnI) values and significant  changes across serial measurements may suggest ACS but many other  chronic and acute conditions are known to elevate hsTnI results.  Refer to the "Links" section for chest pain algorithms and additional  guidance. Performed at [facility], [address] [place], [place] [address]   POC occult blood, ED     Status: Abnormal   Collection Time: 08/20/18  9:45 PM  Result  Value Ref Range   Fecal Occult Bld POSITIVE (A) NEGATIVE   Dg Chest 1 View  Result Date:  08/20/2018 CLINICAL DATA:  Severe abdominal pain EXAM: CHEST  1 VIEW COMPARISON:  11/10/2017 FINDINGS: Cardiac shadow is within normal limits. Postsurgical changes are again seen. Mild interstitial changes are noted bilaterally without focal infiltrate or sizable effusion. No bony abnormality is noted. IMPRESSION: No acute abnormality seen. Electronically Signed   By: Alcide CleverMark  Lukens M.D.   On: 08/20/2018 19:42   Ct Angio Abd/pel W And/or Wo Contrast  Result Date: 08/20/2018 CLINICAL DATA:  Abdominal pain and hematemesis for several hours, history of known abdominal aneurysm. EXAM: CTA ABDOMEN AND PELVIS WITH CONTRAST TECHNIQUE: Multidetector CT imaging of the abdomen and pelvis was performed using the standard protocol during bolus administration of intravenous contrast. Multiplanar reconstructed images and MIPs were obtained and reviewed to evaluate the vascular anatomy. CONTRAST:  100mL OMNIPAQUE IOHEXOL 350 MG/ML SOLN COMPARISON:  06/26/2018 FINDINGS: VASCULAR Aorta: Abdominal aorta demonstrates evidence of the known infrarenal aortic aneurysm. It measures approximately 5.4 cm in greatest dimension using similar measurements as that on the prior exam. Considerable mural thrombus is noted. Mild irregularity of the mural thrombus is noted best seen on image number 82 of series 4. This may be related to some swirling of the contrast within the lumen. No findings to suggest impending rupture are identified at this time. Tapering at the level of the aortic bifurcation is noted similar to that seen on the prior exam. Celiac: Mild atherosclerotic calcifications are noted with 50% stenosis at the origin. The more distal branches are within normal limits. SMA: Patent without evidence of aneurysm, dissection, vasculitis or significant stenosis. Renals: Both renal arteries are patent without evidence of aneurysm, dissection, vasculitis, fibromuscular dysplasia or significant stenosis. IMA: The IMA is patent just beyond its  origin related to collateralization. The orifice of the IMA is occluded secondary to the mural thrombus anteriorly. Iliacs and outflow: Atherosclerotic changes are noted within the iliac arteries bilaterally without aneurysmal dilatation. The superficial femoral artery on the left is occluded just beyond its origin. The left internal iliac artery is again occluded. Veins: No venous abnormality is noted. Review of the MIP images confirms the above findings. NON-VASCULAR Lower chest: Mild scarring is noted in the bases bilaterally. No focal infiltrate is seen. Hepatobiliary: No focal liver abnormality is seen. No gallstones, gallbladder wall thickening, or biliary dilatation. Pancreas: Unremarkable. No pancreatic ductal dilatation or surrounding inflammatory changes. Spleen: Normal in size without focal abnormality. Adrenals/Urinary Tract: Stable right adrenal nodule is again seen. Left adrenal gland is unremarkable. Kidneys demonstrate no obstructive changes. The bladder is well distended. Stomach/Bowel: The appendix is within normal limits. Colon is decompressed. No obstructive or inflammatory changes of the small bowel are seen. The stomach is within normal limits with the exception of a sliding-type hiatal hernia. Lymphatic: No significant lymphadenopathy is identified. Reproductive: Prostate is unremarkable. Other: No abdominal wall hernia or abnormality. No abdominopelvic ascites. Musculoskeletal: Atrophic changes are noted in the musculature of the left thigh consistent with the patient's known history of prior left AKA. Degenerative changes of lumbar spine are seen. IMPRESSION: VASCULAR Stable size of the abdominal aortic aneurysm. Slight variation in the measurement is noted although likely related to technical variation in the imaging. 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 NON-VASCULAR Chronic changes as described above.  No acute abnormality noted. Electronically Signed   By: Alcide CleverMark  Lukens M.D.   On: 08/20/2018 19:41    Pending Labs Unresulted Labs (From admission, onward)   None      Vitals/Pain Today's Vitals   08/20/18 2030 08/20/18 2130 08/20/18 2143 08/20/18 2146  BP: (!) 175/93 (!) 167/85    Pulse: 78 63 71   Resp: 11 18 14    Temp:      TempSrc:      SpO2: 97% (!) 79% 96%   Weight:      Height:      PainSc:    Asleep    Isolation Precautions No active isolations  Medications Medications  morphine 4 MG/ML injection 4 mg (4 mg Intravenous Given 08/20/18 1929)  iohexol (OMNIPAQUE) 350 MG/ML injection 100 mL (100 mLs Intravenous Contrast Given 08/20/18 1857)  HYDROmorphone (DILAUDID) injection 0.5 mg (0.5 mg Intravenous Given 08/20/18 2124)  sodium chloride 0.9 % bolus 500 mL (0 mLs Intravenous Stopped 08/20/18 2143)  ondansetron (ZOFRAN) injection 4 mg (4 mg Intravenous Given 08/20/18 2124)  pantoprazole (PROTONIX) injection 40 mg (40 mg Intravenous Given 08/20/18 2143)    Mobility walks High fall risk   Focused Assessments    R Recommendations: See Admitting Provider Note  Report given to:   Additional Notes: gastro occult positive and has been actively vomiting.

## 2018-08-21 ENCOUNTER — Encounter (HOSPITAL_COMMUNITY): Payer: Self-pay | Admitting: *Deleted

## 2018-08-21 DIAGNOSIS — K920 Hematemesis: Secondary | ICD-10-CM | POA: Diagnosis not present

## 2018-08-21 DIAGNOSIS — R1013 Epigastric pain: Secondary | ICD-10-CM | POA: Diagnosis not present

## 2018-08-21 DIAGNOSIS — R109 Unspecified abdominal pain: Secondary | ICD-10-CM | POA: Diagnosis not present

## 2018-08-21 LAB — CBC
HCT: 46.4 % (ref 39.0–52.0)
Hemoglobin: 14.7 g/dL (ref 13.0–17.0)
MCH: 28.6 pg (ref 26.0–34.0)
MCHC: 31.7 g/dL (ref 30.0–36.0)
MCV: 90.3 fL (ref 80.0–100.0)
Platelets: 251 10*3/uL (ref 150–400)
RBC: 5.14 MIL/uL (ref 4.22–5.81)
RDW: 14.2 % (ref 11.5–15.5)
WBC: 13 10*3/uL — ABNORMAL HIGH (ref 4.0–10.5)
nRBC: 0 % (ref 0.0–0.2)

## 2018-08-21 LAB — HEMOGLOBIN: Hemoglobin: 15 g/dL (ref 13.0–17.0)

## 2018-08-21 LAB — HEMOGLOBIN A1C
Hgb A1c MFr Bld: 6.8 % — ABNORMAL HIGH (ref 4.8–5.6)
Mean Plasma Glucose: 148.46 mg/dL

## 2018-08-21 LAB — PHOSPHORUS: Phosphorus: 4 mg/dL (ref 2.5–4.6)

## 2018-08-21 LAB — MAGNESIUM: Magnesium: 1.9 mg/dL (ref 1.7–2.4)

## 2018-08-21 LAB — HEMATOCRIT: HCT: 46.7 % (ref 39.0–52.0)

## 2018-08-21 MED ORDER — ONDANSETRON HCL 4 MG/2ML IJ SOLN
4.0000 mg | Freq: Four times a day (QID) | INTRAMUSCULAR | Status: DC | PRN
  Administered 2018-08-21 – 2018-08-23 (×4): 4 mg via INTRAVENOUS
  Filled 2018-08-21 (×5): qty 2

## 2018-08-21 MED ORDER — TAMSULOSIN HCL 0.4 MG PO CAPS
0.4000 mg | ORAL_CAPSULE | Freq: Every day | ORAL | Status: DC
  Administered 2018-08-21 – 2018-08-24 (×4): 0.4 mg via ORAL
  Filled 2018-08-21 (×4): qty 1

## 2018-08-21 MED ORDER — ACETAMINOPHEN 325 MG PO TABS: 650.0000 mg | ORAL_TABLET | Freq: Four times a day (QID) | ORAL | Status: DC | PRN

## 2018-08-21 MED ORDER — LISINOPRIL 5 MG PO TABS
5.0000 mg | ORAL_TABLET | Freq: Every day | ORAL | Status: DC
  Administered 2018-08-21 – 2018-08-24 (×4): 5 mg via ORAL
  Filled 2018-08-21 (×4): qty 1

## 2018-08-21 MED ORDER — METOPROLOL TARTRATE 25 MG PO TABS
12.5000 mg | ORAL_TABLET | Freq: Two times a day (BID) | ORAL | Status: DC
  Administered 2018-08-21 – 2018-08-24 (×7): 12.5 mg via ORAL
  Filled 2018-08-21 (×7): qty 1

## 2018-08-21 MED ORDER — HYDROCODONE-ACETAMINOPHEN 5-325 MG PO TABS
1.0000 | ORAL_TABLET | Freq: Four times a day (QID) | ORAL | Status: DC | PRN
  Filled 2018-08-21: qty 1

## 2018-08-21 MED ORDER — PANTOPRAZOLE SODIUM 40 MG IV SOLR: 40.0000 mg | Freq: Every day | INTRAVENOUS | Status: DC

## 2018-08-21 MED ORDER — SODIUM CHLORIDE 0.9 % IV SOLN
INTRAVENOUS | Status: DC
  Administered 2018-08-21 – 2018-08-24 (×5): via INTRAVENOUS

## 2018-08-21 MED ORDER — SIMVASTATIN 20 MG PO TABS
40.0000 mg | ORAL_TABLET | Freq: Every day | ORAL | Status: DC
  Administered 2018-08-21 – 2018-08-23 (×3): 40 mg via ORAL
  Filled 2018-08-21 (×3): qty 2

## 2018-08-21 MED ORDER — PROMETHAZINE HCL 25 MG/ML IJ SOLN
12.5000 mg | Freq: Four times a day (QID) | INTRAMUSCULAR | Status: DC | PRN
  Administered 2018-08-21: 12.5 mg via INTRAVENOUS
  Filled 2018-08-21: qty 1

## 2018-08-21 MED ORDER — ONDANSETRON HCL 4 MG PO TABS
8.0000 mg | ORAL_TABLET | ORAL | Status: DC | PRN
  Filled 2018-08-21: qty 2

## 2018-08-21 MED ORDER — PANTOPRAZOLE SODIUM 40 MG PO TBEC
40.0000 mg | DELAYED_RELEASE_TABLET | Freq: Two times a day (BID) | ORAL | Status: DC
  Administered 2018-08-22 – 2018-08-24 (×4): 40 mg via ORAL
  Filled 2018-08-21 (×7): qty 1

## 2018-08-21 MED ORDER — TRAMADOL HCL 50 MG PO TABS: 50.0000 mg | ORAL_TABLET | Freq: Four times a day (QID) | ORAL | Status: DC | PRN

## 2018-08-21 MED ORDER — POLYETHYLENE GLYCOL 3350 17 G PO PACK: 17.0000 g | PACK | Freq: Every day | ORAL | Status: DC | PRN

## 2018-08-21 MED ORDER — ACETAMINOPHEN 650 MG RE SUPP: 650.0000 mg | Freq: Four times a day (QID) | RECTAL | Status: DC | PRN

## 2018-08-21 NOTE — Progress Notes (Signed)
Per HPI: Sean Simenson Simpsonis a 68 y.o.malewith history of CAD, diabetes, known AAA last measured at 5.6 cm with scheduled repair later this month presents to the ED with severe, sudden abdominal pain that began around 2 PM. He has had associated hematemesis x4.  He has had a total of emesis x8. He reports he woke up feeling fine. He has not eaten anything today.  Last meal was fried fish cooked at home that he caught himself.Someone who knows him found him lying on his front porch and EMS was called. Patient denies any chest pain or shortness of breath, fever, urinary symptoms, diarrhea, bloody stools. Patient denies drinking alcohol regularly. He states he only drinks occasionally.  Patient does report that he smokes marijuana, but does not associate his abdominal pain with that.  Patient reports that he has had this happen to him before about 3 months ago.  That visit to the ED was reviewed and at that time he was not complaining of hematemesis but just diarrhea and emesis.  Patient describes his emesis as spit with color and it.  He reports that initially it was white frothy mouthful, and then progressed to white frothy with more more red in it.  Each episode of emesis was approximately "1 mouthful."  He has not had any lightheadedness, paresthesias, shortness of breath, or other symptoms of anemia.  Since he has been to the ED he has not had any episode of hematemesis or emesis.  Patient describes abdominal pain as sharp, and in the center of his stomach.  It waxes and wanes unpredictably.  Episodes of emesis did not improve or worsen the pain.  Pain improved with treatment in the ED.  I have seen and evaluated the patient at bedside this morning.  He denies any acute complaints or concerns of nausea or abdominal pain currently.  Patient was admitted for hematemesis evaluation was started on Protonix IV daily.  He has had some mild downward trend in his hemoglobin levels, but not severely so.  He  has not had any further episodes of hematemesis since admission and has been seen by GI after consultation this a.m.  They will plan for endoscopy hopefully later today to evaluate further.  Disposition pending EGD evaluation and further recommendations from GI.  Potential discharge in the next 24 to 48 hours.  We will order recheck of a.m. labs at this time.  Appreciate GI evaluation and management.  Total CARE time: 25 minutes.

## 2018-08-21 NOTE — H&P (Signed)
TRH H&P    Patient Demographics:    [name], is a 68 y.o. male  MRN: [id number]  DOB - 20-Jun-1950  Admit Date - 08/20/2018  Referring MD/NP/PA: Lyn Hollingshead law  Outpatient Primary MD for the patient is Carylon Perches, MD  Patient coming from: Home  Chief complaint-hematemesis   HPI:   [name] is a 68 y.o. male with history of CAD, diabetes, known AAA last measured at 5.6 cm with scheduled repair later this month presents to the ED with severe, sudden abdominal pain that began around 2 PM.  He has had associated hematemesis x4.  He has had a total of emesis x8.  He reports he woke up feeling fine.  He has not eaten anything today.  Last meal was fried fish cooked at home that he caught himself. Someone who knows him found him lying on his front porch and EMS was called.  Patient denies any chest pain or shortness of breath, fever, urinary symptoms, diarrhea, bloody stools.  Patient denies drinking alcohol regularly.  He states he only drinks occasionally.  Patient does report that he smokes marijuana, but does not associate his abdominal pain with that.  Patient reports that he has had this happen to him before about 3 months ago.  That visit to the ED was reviewed and at that time he was not complaining of hematemesis but just diarrhea and emesis.  Patient describes his emesis as spit with color and it.  He reports that initially it was white frothy mouthful, and then progressed to white frothy with more more red in it.  Each episode of emesis was approximately "1 mouthful."  He has not had any lightheadedness, paresthesias, shortness of breath, or other symptoms of anemia.  Since he has been to the ED he has not had any episode of hematemesis or emesis.  Patient describes abdominal pain as sharp, and in the center of his stomach.  It waxes and wanes unpredictably.  Episodes of emesis did not improve or worsen  the pain.  Pain improved with treatment in the ED.  ED course N.p.o. status was ordered.  Patient was given Dilaudid, morphine, Zofran, Protonix, and a normal saline bolus of 500 ml.  Chest x-ray was done that showed no acute abnormality.  CT angios was done that showed: #1.  Stable size of abdominal aortic aneurysm.  Slight variation in the measurement is noted although likely related to technical variation in imaging. 2.  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 3.  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  No acute nonvascular abnormality noted.  Patient is being admitted for observation to monitor hemoglobin and any further episodes of hematemesis.   Review of systems:    In addition to the HPI above,  No Fever-chills, patient does admit to diaphoresis with hematemesis episodes. No Headache, No changes with Vision or hearing, No problems swallowing food  or Liquids, No Chest pain, Cough or Shortness of Breath, Positive for abdominal pain, emesis, denies constipation/diarrhea No Blood in stool or Urine, No dysuria, No new skin rashes or bruises, No new joints pains-aches,  No new weakness, tingling, numbness in any extremity, No recent weight gain or loss, No polydypsia or polyphagia, Endorses polyuria for which patient is taking BPH medication. No significant Mental Stressors.  All other systems reviewed and are negative.    Past History of the following :    Past Medical History:  Diagnosis Date   Abdominal aortic aneurysm (AAA) (HCC)    Aortic stenosis    Arthritis    CAD (coronary artery disease)    cabg   Diabetes mellitus    Type II   Heart murmur    Hyperlipidemia    MI (myocardial infarction) ([place])    S/P AKA (above knee amputation) ([place])       Past Surgical History:  Procedure  Laterality Date   ABOVE KNEE LEG AMPUTATION Left 1972   AORTIC VALVE REPLACEMENT N/A 02/06/2017   Procedure: AORTIC VALVE REPLACEMENT (AVR);  Surgeon: Gaye Pollack, MD;  Location: [place];  Service: Open Heart Surgery;  Laterality: N/A;   COLONOSCOPY     CORONARY ANGIOPLASTY WITH STENT PLACEMENT     LEG SURGERY     Repair of left leg trauma   MULTIPLE EXTRACTIONS WITH ALVEOLOPLASTY N/A 11/29/2016   Procedure: Extraction of tooth #'s 4,48,18,56 and 32 with alveoloplasty and gross debridement of remaining teeth;  Surgeon: Lenn Cal, DDS;  Location: [place];  Service: Oral Surgery;  Laterality: N/A;   RIGHT/LEFT HEART CATH AND CORONARY ANGIOGRAPHY N/A 11/14/2016   Procedure: RIGHT/LEFT HEART CATH AND CORONARY ANGIOGRAPHY;  Surgeon: Burnell Blanks, MD;  Location: [facility];  Service: Cardiovascular;  Laterality: N/A;   TEE WITHOUT CARDIOVERSION N/A 02/06/2017   Procedure: TRANSESOPHAGEAL ECHOCARDIOGRAM (TEE);  Surgeon: Gaye Pollack, MD;  Location: [place];  Service: Open Heart Surgery;  Laterality: N/A;      Social History:      Social History   Tobacco Use   Smoking status: Former Smoker    Packs/day: 0.30    Years: 60.00    Pack years: 18.00    Types: Cigars, Cigarettes    Start date: 01/18/1956    Quit date: 11/25/2015    Years since quitting: 2.7   Smokeless tobacco: Never Used  Substance Use Topics   Alcohol use: Yes    Alcohol/week: 0.0 standard drinks    Frequency: Never    Comment: Occasional       Family History :     Family History  Problem Relation Age of Onset   Hypertension Mother    Heart attack Father       Home Medications:   Prior to Admission medications   Medication Sig Start Date End Date Taking? Authorizing Provider  aspirin EC 81 MG tablet Take 81 mg by mouth daily.    [provider]  HYDROcodone-acetaminophen (NORCO/VICODIN) 5-325 MG tablet One tablet by mouth every six hours as needed for pain.  07/24/18   Sanjuana Kava, MD  linagliptin (TRADJENTA) 5 MG TABS tablet Take 5 mg by mouth daily.    [provider]  lisinopril (PRINIVIL,ZESTRIL) 5 MG tablet TAKE 1 TABLET(5 MG) BY MOUTH DAILY 04/09/18   Imogene Burn, PA-C  metoprolol tartrate (LOPRESSOR) 25 MG tablet Take 0.5 tablets (12.5 mg total) by mouth 2 (two) times daily. 11/13/17  Lonia Blood, MD  naproxen (NAPROSYN) 375 MG tablet Take 375 mg by mouth 2 (two) times daily as needed for mild pain.    [provider]  ondansetron (ZOFRAN) 8 MG tablet Take 1 tablet (8 mg total) by mouth every 4 (four) hours as needed. 05/12/18   Donnetta Hutching, MD  simvastatin (ZOCOR) 40 MG tablet Take 40 mg by mouth at bedtime.      [provider]  tamsulosin (FLOMAX) 0.4 MG CAPS capsule Take 0.4 mg by mouth daily.     [provider]  TRESIBA FLEXTOUCH 100 UNIT/ML SOPN FlexTouch Pen Inject 20 Units into the skin every morning.  12/05/17   [provider]     Allergies:    No Known Allergies   Physical Exam:   Vitals  Blood pressure (!) 163/79, pulse (!) 45, temperature 98.1 F (36.7 C), temperature source Oral, resp. rate 17, height 5\' 10"  (1.778 m), weight 64.4 kg, SpO2 100 %.  1.  General: Patient laying left lateral recumbent in bed in no acute distress  2. Psychiatric: Alert and oriented x3 intact insight and judgment  3. Neurologic: Cranial nerves II through XII intact.  No focal deficits on exam.  4. HEENMT:  Head is atraumatic normocephalic, poor dentition, neck is supple without masses, trachea is midline  5. Respiratory : Lungs are clear to auscultation bilaterally  6. Cardiovascular : H RRR with systolic murmur  7. Gastrointestinal:  Abdomen is soft nondistended nontender  8. Skin:  No acute skin changes on limited skin exam  9.Musculoskeletal:  Left BKA with prosthetic in place    Data Review:    CBC Recent Labs  Lab 08/20/18 1924 08/21/18 0126  WBC 13.2*   --   HGB 16.5 15.0  HCT 51.0 46.7  PLT 281  --   MCV 90.3  --   MCH 29.2  --   MCHC 32.4  --   RDW 14.2  --   LYMPHSABS 0.6*  --   MONOABS 0.6  --   EOSABS 0.0  --   BASOSABS 0.0  --    ------------------------------------------------------------------------------------------------------------------  Results for orders placed or performed during the hospital encounter of 08/20/18 (from the past 48 hour(s))  Comprehensive metabolic panel     Status: Abnormal   Collection Time: 08/20/18  7:24 PM  Result Value Ref Range   Sodium 134 (L) 135 - 145 mmol/L   Potassium 4.0 3.5 - 5.1 mmol/L   Chloride 100 98 - 111 mmol/L   CO2 19 (L) 22 - 32 mmol/L   Glucose, Bld 209 (H) 70 - 99 mg/dL   BUN 11 8 - 23 mg/dL   Creatinine, Ser 1.61 0.61 - 1.24 mg/dL   Calcium 9.5 8.9 - 09.6 mg/dL   Total Protein 8.4 (H) 6.5 - 8.1 g/dL   Albumin 4.5 3.5 - 5.0 g/dL   AST 22 15 - 41 U/L   ALT 16 0 - 44 U/L   Alkaline Phosphatase 71 38 - 126 U/L   Total Bilirubin 0.7 0.3 - 1.2 mg/dL   GFR calc non Af Amer >60 >60 mL/min   GFR calc Af Amer >60 >60 mL/min   Anion gap 15 5 - 15    Comment: Performed at [facility], [address] [place], [place] [address]  Lipase, blood     Status: None   Collection Time: 08/20/18  7:24 PM  Result Value Ref Range   Lipase 27 11 - 51 U/L  Comment: Performed at [facility], [address] [place], [place] [address]  CBC with Differential     Status: Abnormal   Collection Time: 08/20/18  7:24 PM  Result Value Ref Range   WBC 13.2 (H) 4.0 - 10.5 K/uL   RBC 5.65 4.22 - 5.81 MIL/uL   Hemoglobin 16.5 13.0 - 17.0 g/dL   HCT 19.151.0 47.839.0 - 29.552.0 %   MCV 90.3 80.0 - 100.0 fL   MCH 29.2 26.0 - 34.0 pg   MCHC 32.4 30.0 - 36.0 g/dL   RDW 62.114.2 30.811.5 - 65.715.5 %   Platelets 281 150 - 400 K/uL   nRBC 0.0 0.0 - 0.2 %   Neutrophils Relative % 91 %   Neutro Abs 12.0 (H) 1.7 - 7.7 K/uL   Lymphocytes Relative 4 %   Lymphs Abs 0.6 (L) 0.7 - 4.0 K/uL   Monocytes Relative 4 %    Monocytes Absolute 0.6 0.1 - 1.0 K/uL   Eosinophils Relative 0 %   Eosinophils Absolute 0.0 0.0 - 0.5 K/uL   Basophils Relative 0 %   Basophils Absolute 0.0 0.0 - 0.1 K/uL   Immature Granulocytes 1 %   Abs Immature Granulocytes 0.10 (H) 0.00 - 0.07 K/uL    Comment: Performed at [facility], [address] [place], [place] [address]  Troponin I (High Sensitivity)     Status: None   Collection Time: 08/20/18  7:24 PM  Result Value Ref Range   Troponin I (High Sensitivity) 6 <18 ng/L    Comment: (NOTE) Elevated high sensitivity troponin I (hsTnI) values and significant  changes across serial measurements may suggest ACS but many other  chronic and acute conditions are known to elevate hsTnI results.  Refer to the "Links" section for chest pain algorithms and additional  guidance. Performed at [facility], [address] [place], [place] [address]   Type and screen [facility]     Status: None   Collection Time: 08/20/18  7:24 PM  Result Value Ref Range   ABO/RH(D) A POS    Antibody Screen NEG    Sample Expiration      08/23/2018,2359 Performed at [facility], [address] [place], [place] [address]   Protime-INR     Status: None   Collection Time: 08/20/18  7:24 PM  Result Value Ref Range   Prothrombin Time 13.3 11.4 - 15.2 seconds   INR 1.0 0.8 - 1.2    Comment: (NOTE) INR goal varies based on device and disease states. Performed at [facility], [address] [place], [place] [address]   SARS Coronavirus 2 [facility](Hospital order, Performed in [facility] hospital lab)     Status: None   Collection Time: 08/20/18  7:45 PM  Result Value Ref Range   SARS Coronavirus 2 NEGATIVE NEGATIVE    Comment: (NOTE) If result is NEGATIVE SARS-CoV-2 target nucleic acids are NOT DETECTED. The SARS-CoV-2 RNA is generally detectable in upper and lower  respiratory specimens during the acute phase of infection. The lowest  concentration of SARS-CoV-2 viral copies this assay can  detect is 250  copies / mL. A negative result does not preclude SARS-CoV-2 infection  and should not be used as the sole basis for treatment or other  patient management decisions.  A negative result may occur with  improper specimen collection / handling, submission of specimen other  than nasopharyngeal swab, presence of viral mutation(s) within the  areas targeted by this assay, and inadequate number of  viral copies  (<250 copies / mL). A negative result must be combined with clinical  observations, patient history, and epidemiological information. If result is POSITIVE SARS-CoV-2 target nucleic acids are DETECTED. The SARS-CoV-2 RNA is generally detectable in upper and lower  respiratory specimens dur ing the acute phase of infection.  Positive  results are indicative of active infection with SARS-CoV-2.  Clinical  correlation with patient history and other diagnostic information is  necessary to determine patient infection status.  Positive results do  not rule out bacterial infection or co-infection with other viruses. If result is PRESUMPTIVE POSTIVE SARS-CoV-2 nucleic acids MAY BE PRESENT.   A presumptive positive result was obtained on the submitted specimen  and confirmed on repeat testing.  While 2019 novel coronavirus  (SARS-CoV-2) nucleic acids may be present in the submitted sample  additional confirmatory testing may be necessary for epidemiological  and / or clinical management purposes  to differentiate between  SARS-CoV-2 and other Sarbecovirus currently known to infect humans.  If clinically indicated additional testing with an alternate test  methodology [phone]([id number]) is advised. The SARS-CoV-2 RNA is generally  detectable in upper and lower respiratory sp ecimens during the acute  phase of infection. The expected result is Negative. Fact Sheet for Patients:  BoilerBrush.com.cyhttps://www.fda.gov/media/136312/download Fact Sheet for Healthcare  Providers: https://pope.com/https://www.fda.gov/media/136313/download This test is not yet approved or cleared by the [place] FDA and has been authorized for detection and/or diagnosis of SARS-CoV-2 by FDA under an Emergency Use Authorization (EUA).  This EUA will remain in effect (meaning this test can be used) for the duration of the COVID-19 declaration under Section 564(b)(1) of the Act, 21 U.S.C. section 360bbb-3(b)(1), unless the authorization is terminated or revoked sooner. Performed at [facility], [address] [place], [place] [address]   Troponin I (High Sensitivity)     Status: None   Collection Time: 08/20/18  8:37 PM  Result Value Ref Range   Troponin I (High Sensitivity) 6 <18 ng/L    Comment: (NOTE) Elevated high sensitivity troponin I (hsTnI) values and significant  changes across serial measurements may suggest ACS but many other  chronic and acute conditions are known to elevate hsTnI results.  Refer to the "Links" section for chest pain algorithms and additional  guidance. Performed at [facility], [address] [place], [place] [address]   POC occult blood, ED     Status: Abnormal   Collection Time: 08/20/18  9:45 PM  Result Value Ref Range   Fecal Occult Bld POSITIVE (A) NEGATIVE  Hemoglobin     Status: None   Collection Time: 08/21/18  1:26 AM  Result Value Ref Range   Hemoglobin 15.0 13.0 - 17.0 g/dL    Comment: Performed at [facility], [address] [place], [place] [address]  Hematocrit     Status: None   Collection Time: 08/21/18  1:26 AM  Result Value Ref Range   HCT 46.7 39.0 - 52.0 %    Comment: Performed at [facility], [address] [place], [place] [address]    Chemistries  Recent Labs  Lab 08/20/18 1924  NA 134*  K 4.0  CL 100  CO2 19*  GLUCOSE 209*  BUN 11  CREATININE 0.99  CALCIUM 9.5  AST 22  ALT 16  ALKPHOS 71  BILITOT 0.7    ------------------------------------------------------------------------------------------------------------------  ------------------------------------------------------------------------------------------------------------------ GFR: Estimated Creatinine Clearance: 66 mL/min (by C-G formula based on SCr of 0.99 mg/dL). Liver Function Tests: Recent Labs  Lab 08/20/18 1924  AST 22  ALT 16  ALKPHOS 71  BILITOT 0.7  PROT 8.4*  ALBUMIN 4.5   Recent Labs  Lab 08/20/18 1924  LIPASE 27   No results for input(s): AMMONIA in the last 168 hours. Coagulation Profile: Recent Labs  Lab 08/20/18 1924  INR 1.0   Cardiac Enzymes: No results for input(s): CKTOTAL, CKMB, CKMBINDEX, TROPONINI in the last 168 hours. BNP (last 3 results) No results for input(s): PROBNP in the last 8760 hours. HbA1C: No results for input(s): HGBA1C in the last 72 hours. CBG: No results for input(s): GLUCAP in the last 168 hours. Lipid Profile: No results for input(s): CHOL, HDL, LDLCALC, TRIG, CHOLHDL, LDLDIRECT in the last 72 hours. Thyroid Function Tests: No results for input(s): TSH, T4TOTAL, FREET4, T3FREE, THYROIDAB in the last 72 hours. Anemia Panel: No results for input(s): VITAMINB12, FOLATE, FERRITIN, TIBC, IRON, RETICCTPCT in the last 72 hours.  --------------------------------------------------------------------------------------------------------------- Urine analysis:    Component Value Date/Time   COLORURINE AMBER (A) 02/01/2018 1108   APPEARANCEUR CLOUDY (A) 02/01/2018 1108   LABSPEC 1.017 02/01/2018 1108   PHURINE 6.0 02/01/2018 1108   GLUCOSEU 50 (A) 02/01/2018 1108   GLUCOSEU neg 02/03/2009 1250   HGBUR LARGE (A) 02/01/2018 1108   BILIRUBINUR NEGATIVE 02/01/2018 1108   KETONESUR NEGATIVE 02/01/2018 1108   PROTEINUR 100 (A) 02/01/2018 1108   NITRITE NEGATIVE 02/01/2018 1108   LEUKOCYTESUR MODERATE (A) 02/01/2018 1108      Imaging Results:    Dg Chest 1 View  Result  Date: 08/20/2018 CLINICAL DATA:  Severe abdominal pain EXAM: CHEST  1 VIEW COMPARISON:  11/10/2017 FINDINGS: Cardiac shadow is within normal limits. Postsurgical changes are again seen. Mild interstitial changes are noted bilaterally without focal infiltrate or sizable effusion. No bony abnormality is noted. IMPRESSION: No acute abnormality seen. Electronically Signed   By: Alcide Clever M.D.   On: 08/20/2018 19:42   Ct Angio Abd/pel W And/or Wo Contrast  Result Date: 08/20/2018 CLINICAL DATA:  Abdominal pain and hematemesis for several hours, history of known abdominal aneurysm. EXAM: CTA ABDOMEN AND PELVIS WITH CONTRAST TECHNIQUE: Multidetector CT imaging of the abdomen and pelvis was performed using the standard protocol during bolus administration of intravenous contrast. Multiplanar reconstructed images and MIPs were obtained and reviewed to evaluate the vascular anatomy. CONTRAST:  OMNIPAQUE IOHEXOL 350 MG/ML SOLN COMPARISON:  06/26/2018 FINDINGS: VASCULAR Aorta: Abdominal aorta demonstrates evidence of the known infrarenal aortic aneurysm. It measures approximately 5.4 cm in greatest dimension using similar measurements as that on the prior exam. Considerable mural thrombus is noted. Mild irregularity of the mural thrombus is noted best seen on image number 82 of series 4. This may be related to some swirling of the contrast within the lumen. No findings to suggest impending rupture are identified at this time. Tapering at the level of the aortic bifurcation is noted similar to that seen on the prior exam. Celiac: Mild atherosclerotic calcifications are noted with 50% stenosis at the origin. The more distal branches are within normal limits. SMA: Patent without evidence of aneurysm, dissection, vasculitis or significant stenosis. Renals: Both renal arteries are patent without evidence of aneurysm, dissection, vasculitis, fibromuscular dysplasia or significant stenosis. IMA: The IMA is patent just  beyond its origin related to collateralization. The orifice of the IMA is occluded secondary to the mural thrombus anteriorly. Iliacs and outflow: Atherosclerotic changes are noted within the iliac arteries bilaterally without aneurysmal dilatation. The superficial femoral artery on the left is occluded just beyond its  origin. The left internal iliac artery is again occluded. Veins: No venous abnormality is noted. Review of the MIP images confirms the above findings. NON-VASCULAR Lower chest: Mild scarring is noted in the bases bilaterally. No focal infiltrate is seen. Hepatobiliary: No focal liver abnormality is seen. No gallstones, gallbladder wall thickening, or biliary dilatation. Pancreas: Unremarkable. No pancreatic ductal dilatation or surrounding inflammatory changes. Spleen: Normal in size without focal abnormality. Adrenals/Urinary Tract: Stable right adrenal nodule is again seen. Left adrenal gland is unremarkable. Kidneys demonstrate no obstructive changes. The bladder is well distended. Stomach/Bowel: The appendix is within normal limits. Colon is decompressed. No obstructive or inflammatory changes of the small bowel are seen. The stomach is within normal limits with the exception of a sliding-type hiatal hernia. Lymphatic: No significant lymphadenopathy is identified. Reproductive: Prostate is unremarkable. Other: No abdominal wall hernia or abnormality. No abdominopelvic ascites. Musculoskeletal: Atrophic changes are noted in the musculature of the left thigh consistent with the patient's known history of prior left AKA. Degenerative changes of lumbar spine are seen. IMPRESSION: VASCULAR Stable size of the abdominal aortic aneurysm. Slight variation in the measurement is noted although likely related to technical variation in the imaging. 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 Occlusion of the left superficial femoral  artery and left internal iliac artery at their origins, stable from the prior exam. NON-VASCULAR Chronic changes as described above.  No acute abnormality noted. Electronically Signed   By: Alcide CleverMark  Lukens M.D.   On: 08/20/2018 19:41    My personal review of EKG: Rhythm sinus bradycardia, Rate 46/min, QTc 412,no Acute ST changes   Assessment & Plan:    Active Problems:   Hematemesis of unknown cause   1. Hematemesis 1. Patient started on Protonix 40 mg IV in the ER continue this medication. 2. H&H dropped from 16.5 hemoglobin to 15.0-this is likely hemodilution all with fluids. 3. Continue to monitor with CBC at 5 AM. 4. No further episodes of hematemesis since he has been in the hospital. 5. May consider GI consult 2. Hyponatremia 1. Likely to correct with fluids.  Continue to monitor with daily CHEM panel 3. Hyperglycemia 1. In the setting of diabetes mellitus type 2. 2. Continue sliding scale coverage 3. Carb modified diet 4. Diabetes mellitus 1. See plan for #3 5. Abdominal aortic aneurysm 1. Continue medical management 2. Patient scheduled for repair at the end of this month   DVT Prophylaxis-no anticoagulation due to possibility of GI bleed AM Labs Ordered, also please review Full Orders  Family Communication: Admission, patients condition and plan of care including tests being ordered have been discussed with the patient he verbalizes understanding and agrees with the plan and Code Status.  Code Status: Full  Admission status: Observation: Based on patients clinical presentation and evaluation of above clinical data, I have made determination that patient meets observation criteria at this time  Time spent in minutes : 68   Cielle Aguila B Zierle-Ghosh M.D on 08/21/2018 at 2:50 AM

## 2018-08-21 NOTE — H&P (View-Only) (Signed)
Referring Provider: Dr. Arbutus Leasat  Primary Care Physician:  Carylon PerchesFagan, Roy, MD Primary Gastroenterologist:  Dr. Jena Gaussourk   Date of Admission: 08/20/2018 Date of Consultation: 08/21/2018  Reason for Consultation: Hematemesis   HPI:  [name] is a 68 y.o. year old male with a history of an abdominal aortic aneurysm, followed by Vascular with plans for surgery in the future. Presented yesterday with acute onset of abdominal pain, repetitive vomiting, and reports of coffee-ground emesis. CT angiogram abd/pelvis with stable AAA, mild irregularity of the mural thrombus within the aorta without definitive changes of impending rupture. Admitting HGb 16.5. Today 14.7.   Acute onset of abdominal pain starting at 2pm yesterday. Located upper abdomen. Woke up feeling nauseated and vomited. Felt faint. Vomited an additional 3 times and feeling weak. Laid on the porch. Neighbor came by and was worried. Patient had repetitive vomiting while sitting on the porch. These episodes were mostly "spit". After multiple episodes he vomited coffee grounds. Denies abdominal pain currently unless coughing. Last vomiting around 3am, small amount of clear liquids with dark contents. No nausea.   No chronic GERD. Denies dysphagia. No melena. No hematochezia. No weight loss or lack of appetite. No changes in bowel habits. No prior EGD. Takes Aleve "every now and then". Denies daily use. Takes a few times a week if doing more strenuous work. 81 mg aspirin daily.   Drinks alcohol occasionally. Marijuana daily if can find it. States helps with depression.     Past Medical History:  Diagnosis Date  . Abdominal aortic aneurysm (AAA) (HCC)   . Aortic stenosis   . Arthritis   . CAD (coronary artery disease)    cabg  . Diabetes mellitus    Type II  . Heart murmur   . Hyperlipidemia   . MI (myocardial infarction) (HCC)   . S/P AKA (above knee amputation) (HCC)     Past Surgical History:  Procedure Laterality Date  .  ABOVE KNEE LEG AMPUTATION Left 1972  . AORTIC VALVE REPLACEMENT N/A 02/06/2017   Procedure: AORTIC VALVE REPLACEMENT (AVR);  Surgeon: Alleen BorneBartle, Bryan K, MD;  [facility];  Service: Open Heart Surgery;  Laterality: N/A;  . COLONOSCOPY    . CORONARY ANGIOPLASTY WITH STENT PLACEMENT    . LEG SURGERY     Repair of left leg trauma  . MULTIPLE EXTRACTIONS WITH ALVEOLOPLASTY N/A 11/29/2016   Procedure: Extraction of tooth #'s 9,60,45,401,24,25,26 and 32 with alveoloplasty and gross debridement of remaining teeth;  Surgeon: Charlynne PanderKulinski, Ronald F, DDS;  Location: [facility] OR;  Service: Oral Surgery;  Laterality: N/A;  . RIGHT/LEFT HEART CATH AND CORONARY ANGIOGRAPHY N/A 11/14/2016   Procedure: RIGHT/LEFT HEART CATH AND CORONARY ANGIOGRAPHY;  Surgeon: Kathleene HazelMcAlhany, Christopher D, MD;  Location: [facility] INVASIVE CV LAB;  Service: Cardiovascular;  Laterality: N/A;  . TEE WITHOUT CARDIOVERSION N/A 02/06/2017   Procedure: TRANSESOPHAGEAL ECHOCARDIOGRAM (TEE);  Surgeon: Alleen BorneBartle, Bryan K, MD;  [facility];  Service: Open Heart Surgery;  Laterality: N/A;    Prior to Admission medications   Medication Sig Start Date End Date Taking? Authorizing Provider  aspirin EC 81 MG tablet Take 81 mg by mouth daily.   Yes [provider]  HYDROcodone-acetaminophen (NORCO/VICODIN) 5-325 MG tablet One tablet by mouth every six hours as needed for pain. 07/24/18  Yes Darreld McleanKeeling, Wayne, MD  linagliptin (TRADJENTA) 5 MG TABS tablet Take 5 mg by mouth daily.   Yes [provider]  metoprolol tartrate (LOPRESSOR) 25 MG tablet Take 0.5  tablets (12.5 mg total) by mouth 2 (two) times daily. 11/13/17  Yes Lonia BloodMcClung, Jeffrey T, MD  simvastatin (ZOCOR) 40 MG tablet Take 40 mg by mouth at bedtime.     Yes [provider]  tamsulosin (FLOMAX) 0.4 MG CAPS capsule Take 0.4 mg by mouth daily.    Yes [provider]  TRESIBA FLEXTOUCH 100 UNIT/ML SOPN FlexTouch Pen Inject 20 Units into the skin every morning.  12/05/17  Yes [provider]  lisinopril (PRINIVIL,ZESTRIL) 5 MG tablet TAKE 1 TABLET(5 MG) BY MOUTH DAILY Patient not taking: Reported on 08/21/2018 04/09/18   Dyann KiefLenze, Michele M, PA-C  ondansetron (ZOFRAN) 8 MG tablet Take 1 tablet (8 mg total) by mouth every 4 (four) hours as needed. Patient not taking: Reported on 08/21/2018 05/12/18   Donnetta Hutchingook, Brian, MD    Current Facility-Administered Medications  Medication Dose Route Frequency Provider Last Rate Last Dose  . 0.9 %  sodium chloride infusion   Intravenous Continuous Zierle-Ghosh, Asia B, DO 75 mL/hr at 08/21/18 0055    . acetaminophen (TYLENOL) tablet 650 mg  650 mg Oral Q6H PRN Zierle-Ghosh, Asia B, DO       Or  . acetaminophen (TYLENOL) suppository 650 mg  650 mg Rectal Q6H PRN Zierle-Ghosh, Asia B, DO      . HYDROcodone-acetaminophen (NORCO/VICODIN) 5-325 MG per tablet 1 tablet  1 tablet Oral Q6H PRN Zierle-Ghosh, Asia B, DO      . lisinopril (ZESTRIL) tablet 5 mg  5 mg Oral Daily Zierle-Ghosh, Asia B, DO   5 mg at 08/21/18 0904  . metoprolol tartrate (LOPRESSOR) tablet 12.5 mg  12.5 mg Oral BID Zierle-Ghosh, Asia B, DO   12.5 mg at 08/21/18 0905  . ondansetron (ZOFRAN) tablet 8 mg  8 mg Oral Q4H PRN Zierle-Ghosh, Asia B, DO      . pantoprazole (PROTONIX) injection 40 mg  40 mg Intravenous QHS Zierle-Ghosh, Asia B, DO      . polyethylene glycol (MIRALAX / GLYCOLAX) packet 17 g  17 g Oral Daily PRN Zierle-Ghosh, Asia B, DO      . simvastatin (ZOCOR) tablet 40 mg  40 mg Oral QHS Zierle-Ghosh, Asia B, DO      . tamsulosin (FLOMAX) capsule 0.4 mg  0.4 mg Oral Daily Zierle-Ghosh, Asia B, DO   0.4 mg at 08/21/18 0905  . traMADol (ULTRAM) tablet 50 mg  50 mg Oral Q6H PRN Zierle-Ghosh, Asia B, DO        Allergies as of 08/20/2018  . (No Known Allergies)    Family History  Problem Relation Age of Onset  . Hypertension Mother   . Heart attack Father     Social History   Socioeconomic History  . Marital status: Single    Spouse name: Not on file  .  Number of children: 0  . Years of education: Not on file  . Highest education level: Not on file  Occupational History  . Occupation: Disabled since 1989  Social Needs  . Financial resource strain: Not on file  . Food insecurity    Worry: Not on file    Inability: Not on file  . Transportation needs    Medical: Not on file    Non-medical: Not on file  Tobacco Use  . Smoking status: Former Smoker    Packs/day: 0.30    Years: 60.00    Pack years: 18.00    Types: Cigars, Cigarettes    Start date: 01/18/1956    Quit  date: 11/25/2015    Years since quitting: 2.7  . Smokeless tobacco: Never Used  Substance and Sexual Activity  . Alcohol use: Yes    Alcohol/week: 0.0 standard drinks    Frequency: Never    Comment: Occasional  . Drug use: Yes    Types: Marijuana    Comment: daily  . Sexual activity: Never  Lifestyle  . Physical activity    Days per week: Not on file    Minutes per session: Not on file  . Stress: Not on file  Relationships  . Social Musicianconnections    Talks on phone: Not on file    Gets together: Not on file    Attends religious service: Not on file    Active member of club or organization: Not on file    Attends meetings of clubs or organizations: Not on file    Relationship status: Not on file  . Intimate partner violence    Fear of current or ex partner: Not on file    Emotionally abused: Not on file    Physically abused: Not on file    Forced sexual activity: Not on file  Other Topics Concern  . Not on file  Social History Narrative   No regular exercise    Review of Systems: Gen: Denies fever, chills, loss of appetite, change in weight or weight loss CV: Denies chest pain, heart palpitations, syncope, edema  Resp: Denies shortness of breath with rest, cough, wheezing GI: see HPI GU : Denies urinary burning, urinary frequency, urinary incontinence.  MS: Denies joint pain,swelling, cramping Derm: Denies rash, itching, dry skin Psych:  +Depression Heme: see HPI  Physical Exam: Vital signs in last 24 hours: Temp:  [97.5 F (36.4 C)-98.1 F (36.7 C)] 97.5 F (36.4 C) (08/04 0541) Pulse Rate:  [45-81] 80 (08/04 0541) Resp:  [11-20] 16 (08/04 0541) BP: (113-192)/(72-96) 126/83 (08/04 0541) SpO2:  [79 %-100 %] 93 % (08/04 0541) Weight:  [64.4 kg-72.6 kg] 64.4 kg (08/04 0541)   General:   Alert,  Well-developed, well-nourished, pleasant and cooperative in NAD Head:  Normocephalic and atraumatic. Eyes:  Sclera clear, no icterus.   Conjunctiva pink. Ears:  Normal auditory acuity. Lungs:  Clear throughout to auscultation.    Cardiac: S1 S2 present, no obvious murmur  Abdomen:  Soft, nontender and nondistended. No masses, hepatosplenomegaly or hernias noted. Normal bowel sounds, without guarding, and without rebound.   Rectal:  Deferred  Msk:  Left AKA  Extremities:  Without edema. Left AKA Neurologic:  Alert and  oriented x4 Psych:  Alert and cooperative. Normal mood and affect.  Intake/Output from previous day: 08/03 0701 - 08/04 0700 In: 155.1 [I.V.:155.1] Out: -  Intake/Output this shift: No intake/output data recorded.  Lab Results: Recent Labs    08/20/18 1924 08/21/18 0126 08/21/18 0729  WBC 13.2*  --  13.0*  HGB 16.5 15.0 14.7  HCT 51.0 46.7 46.4  PLT 281  --  251   BMET Recent Labs    08/20/18 1924  NA 134*  K 4.0  CL 100  CO2 19*  GLUCOSE 209*  BUN 11  CREATININE 0.99  CALCIUM 9.5   LFT Recent Labs    08/20/18 1924  PROT 8.4*  ALBUMIN 4.5  AST 22  ALT 16  ALKPHOS 71  BILITOT 0.7   PT/INR Recent Labs    08/20/18 1924  LABPROT 13.3  INR 1.0    Studies/Results: Dg Chest 1 View  Result Date: 08/20/2018 CLINICAL DATA:  Severe abdominal pain EXAM: CHEST  1 VIEW COMPARISON:  11/10/2017 FINDINGS: Cardiac shadow is within normal limits. Postsurgical changes are again seen. Mild interstitial changes are noted bilaterally without focal infiltrate or sizable effusion. No bony  abnormality is noted. IMPRESSION: No acute abnormality seen. Electronically Signed   By: Alcide Clever M.D.   On: 08/20/2018 19:42   Ct Angio Abd/pel W And/or Wo Contrast  Result Date: 08/20/2018 CLINICAL DATA:  Abdominal pain and hematemesis for several hours, history of known abdominal aneurysm. EXAM: CTA ABDOMEN AND PELVIS WITH CONTRAST TECHNIQUE: Multidetector CT imaging of the abdomen and pelvis was performed using the standard protocol during bolus administration of intravenous contrast. Multiplanar reconstructed images and MIPs were obtained and reviewed to evaluate the vascular anatomy. CONTRAST:  OMNIPAQUE IOHEXOL 350 MG/ML SOLN COMPARISON:  06/26/2018 FINDINGS: VASCULAR Aorta: Abdominal aorta demonstrates evidence of the known infrarenal aortic aneurysm. It measures approximately 5.4 cm in greatest dimension using similar measurements as that on the prior exam. Considerable mural thrombus is noted. Mild irregularity of the mural thrombus is noted best seen on image number 82 of series 4. This may be related to some swirling of the contrast within the lumen. No findings to suggest impending rupture are identified at this time. Tapering at the level of the aortic bifurcation is noted similar to that seen on the prior exam. Celiac: Mild atherosclerotic calcifications are noted with 50% stenosis at the origin. The more distal branches are within normal limits. SMA: Patent without evidence of aneurysm, dissection, vasculitis or significant stenosis. Renals: Both renal arteries are patent without evidence of aneurysm, dissection, vasculitis, fibromuscular dysplasia or significant stenosis. IMA: The IMA is patent just beyond its origin related to collateralization. The orifice of the IMA is occluded secondary to the mural thrombus anteriorly. Iliacs and outflow: Atherosclerotic changes are noted within the iliac arteries bilaterally without aneurysmal dilatation. The superficial femoral artery on the left  is occluded just beyond its origin. The left internal iliac artery is again occluded. Veins: No venous abnormality is noted. Review of the MIP images confirms the above findings. NON-VASCULAR Lower chest: Mild scarring is noted in the bases bilaterally. No focal infiltrate is seen. Hepatobiliary: No focal liver abnormality is seen. No gallstones, gallbladder wall thickening, or biliary dilatation. Pancreas: Unremarkable. No pancreatic ductal dilatation or surrounding inflammatory changes. Spleen: Normal in size without focal abnormality. Adrenals/Urinary Tract: Stable right adrenal nodule is again seen. Left adrenal gland is unremarkable. Kidneys demonstrate no obstructive changes. The bladder is well distended. Stomach/Bowel: The appendix is within normal limits. Colon is decompressed. No obstructive or inflammatory changes of the small bowel are seen. The stomach is within normal limits with the exception of a sliding-type hiatal hernia. Lymphatic: No significant lymphadenopathy is identified. Reproductive: Prostate is unremarkable. Other: No abdominal wall hernia or abnormality. No abdominopelvic ascites. Musculoskeletal: Atrophic changes are noted in the musculature of the left thigh consistent with the patient's known history of prior left AKA. Degenerative changes of lumbar spine are seen. IMPRESSION: VASCULAR Stable size of the abdominal aortic aneurysm. Slight variation in the measurement is noted although likely related to technical variation in the imaging. 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 NON-VASCULAR Chronic changes as described above.  No acute abnormality noted. Electronically Signed   By: Eulah Pont.D.  On: 08/20/2018 19:41    Impression: 68 year old male presenting with acute onset  of epigastric abdominal pain and associated vomiting, with coffee-ground emesis after multiple episodes. Hemoglobin 14.7 today, no melena, and clinically improved. Endorses Aleve several times per week and takes 81 mg aspirin daily. No PPI. Seems most consistent with MW tear but unable to rule out gastritis, PUD. Notable history of abdominal aortic aneurysm (5.6 cm) that has been evaluated by Vasc Surgery with plans for endovascular aneurysm repair in future after cardiac clearance. I do see where he saw cardiology in Feb 2020 and had low risk nuclear stress test Dec 2019, with other chronic conditions stable.   Will provide clear liquids today and plan for EGD with Propofol tomorrow with Dr. Gala Romney. Risks and benefits discussed with patient, and he is agreeable to proceed.   Plan: Follow H/H Clear liquids today Continue PPI NPO after midnight EGD with Propofol on 08/22/2018 Avoid NSAIDs   Annitta Needs, PhD, ANP-BC [facility]     LOS: 0 days    08/21/2018, 10:15 AM

## 2018-08-21 NOTE — Care Management Obs Status (Signed)
[place] NOTIFICATION   Patient Details  Name: [name] MRN: [id number] Date of Birth: Jun 24, 1950   Medicare Observation Status Notification Given:  Yes    Trish Mage, LCSW 08/21/2018, 3:01 PM

## 2018-08-21 NOTE — Consult Note (Signed)
  Referring Provider: Dr. Tat  Primary Care Physician:  Fagan, Roy, MD Primary Gastroenterologist:  Dr. Rourk   Date of Admission: 08/20/2018 Date of Consultation: 08/21/2018  Reason for Consultation: Hematemesis   HPI:  [name] is a 67 y.o. year old male with a history of an abdominal aortic aneurysm, followed by Vascular with plans for surgery in the future. Presented yesterday with acute onset of abdominal pain, repetitive vomiting, and reports of coffee-ground emesis. CT angiogram abd/pelvis with stable AAA, mild irregularity of the mural thrombus within the aorta without definitive changes of impending rupture. Admitting HGb 16.5. Today 14.7.   Acute onset of abdominal pain starting at 2pm yesterday. Located upper abdomen. Woke up feeling nauseated and vomited. Felt faint. Vomited an additional 3 times and feeling weak. Laid on the porch. Neighbor came by and was worried. [name] had repetitive vomiting while sitting on the porch. These episodes were mostly "spit". After multiple episodes he vomited coffee grounds. Denies abdominal pain currently unless coughing. Last vomiting around 3am, small amount of clear liquids with dark contents. No nausea.   No chronic GERD. Denies dysphagia. No melena. No hematochezia. No weight loss or lack of appetite. No changes in bowel habits. No prior EGD. Takes Aleve "every now and then". Denies daily use. Takes a few times a week if doing more strenuous work. 81 mg aspirin daily.   Drinks alcohol occasionally. Marijuana daily if can find it. States helps with depression.     Past Medical History:  Diagnosis Date  . Abdominal aortic aneurysm (AAA) (HCC)   . Aortic stenosis   . Arthritis   . CAD (coronary artery disease)    cabg  . Diabetes mellitus    Type II  . Heart murmur   . Hyperlipidemia   . MI (myocardial infarction) (HCC)   . S/P AKA (above knee amputation) (HCC)     Past Surgical History:  Procedure Laterality Date  .  ABOVE KNEE LEG AMPUTATION Left 1972  . AORTIC VALVE REPLACEMENT N/A 02/06/2017   Procedure: AORTIC VALVE REPLACEMENT (AVR);  Surgeon: Bartle, Bryan K, MD;  Location: [facility] OR;  Service: Open Heart Surgery;  Laterality: N/A;  . COLONOSCOPY    . CORONARY ANGIOPLASTY WITH STENT PLACEMENT    . LEG SURGERY     Repair of left leg trauma  . MULTIPLE EXTRACTIONS WITH ALVEOLOPLASTY N/A 11/29/2016   Procedure: Extraction of tooth #'s 1,24,25,26 and 32 with alveoloplasty and gross debridement of remaining teeth;  Surgeon: Kulinski, Ronald F, DDS;  Location: [facility] OR;  Service: Oral Surgery;  Laterality: N/A;  . RIGHT/LEFT HEART CATH AND CORONARY ANGIOGRAPHY N/A 11/14/2016   Procedure: RIGHT/LEFT HEART CATH AND CORONARY ANGIOGRAPHY;  Surgeon: McAlhany, Christopher D, MD;  Location: [facility] INVASIVE [facility];  Service: Cardiovascular;  Laterality: N/A;  . TEE WITHOUT CARDIOVERSION N/A 02/06/2017   Procedure: TRANSESOPHAGEAL ECHOCARDIOGRAM (TEE);  Surgeon: Bartle, Bryan K, MD;  Location: [facility] OR;  Service: Open Heart Surgery;  Laterality: N/A;    Prior to Admission medications   Medication Sig Start Date End Date Taking? Authorizing Provider  aspirin EC 81 MG tablet Take 81 mg by mouth daily.   Yes [provider]  HYDROcodone-acetaminophen (NORCO/VICODIN) 5-325 MG tablet One tablet by mouth every six hours as needed for pain. 07/24/18  Yes Keeling, Wayne, MD  linagliptin (TRADJENTA) 5 MG TABS tablet Take 5 mg by mouth daily.   Yes [provider]  metoprolol tartrate (LOPRESSOR) 25 MG tablet Take 0.5   tablets (12.5 mg total) by mouth 2 (two) times daily. 11/13/17  Yes McClung, Jeffrey T, MD  simvastatin (ZOCOR) 40 MG tablet Take 40 mg by mouth at bedtime.     Yes [provider]  tamsulosin (FLOMAX) 0.4 MG CAPS capsule Take 0.4 mg by mouth daily.    Yes [provider]  TRESIBA FLEXTOUCH 100 UNIT/ML SOPN FlexTouch Pen Inject 20 Units into the skin every morning.  12/05/17  Yes [provider]  lisinopril (PRINIVIL,ZESTRIL) 5 MG tablet TAKE 1 TABLET(5 MG) BY MOUTH DAILY [name] not taking: Reported on 08/21/2018 04/09/18   Lenze, Michele M, PA-C  ondansetron (ZOFRAN) 8 MG tablet Take 1 tablet (8 mg total) by mouth every 4 (four) hours as needed. [name] not taking: Reported on 08/21/2018 05/12/18   Cook, Brian, MD    Current Facility-Administered Medications  Medication Dose Route Frequency Provider Last Rate Last Dose  . 0.9 %  sodium chloride infusion   Intravenous Continuous Zierle-Ghosh, Asia B, DO 75 mL/hr at 08/21/18 0055    . acetaminophen (TYLENOL) tablet 650 mg  650 mg Oral Q6H PRN Zierle-Ghosh, Asia B, DO       Or  . acetaminophen (TYLENOL) suppository 650 mg  650 mg Rectal Q6H PRN Zierle-Ghosh, Asia B, DO      . HYDROcodone-acetaminophen (NORCO/VICODIN) 5-325 MG per tablet 1 tablet  1 tablet Oral Q6H PRN Zierle-Ghosh, Asia B, DO      . lisinopril (ZESTRIL) tablet 5 mg  5 mg Oral Daily Zierle-Ghosh, Asia B, DO   5 mg at 08/21/18 0904  . metoprolol tartrate (LOPRESSOR) tablet 12.5 mg  12.5 mg Oral BID Zierle-Ghosh, Asia B, DO   12.5 mg at 08/21/18 0905  . ondansetron (ZOFRAN) tablet 8 mg  8 mg Oral Q4H PRN Zierle-Ghosh, Asia B, DO      . pantoprazole (PROTONIX) injection 40 mg  40 mg Intravenous QHS Zierle-Ghosh, Asia B, DO      . polyethylene glycol (MIRALAX / GLYCOLAX) packet 17 g  17 g Oral Daily PRN Zierle-Ghosh, Asia B, DO      . simvastatin (ZOCOR) tablet 40 mg  40 mg Oral QHS Zierle-Ghosh, Asia B, DO      . tamsulosin (FLOMAX) capsule 0.4 mg  0.4 mg Oral Daily Zierle-Ghosh, Asia B, DO   0.4 mg at 08/21/18 0905  . traMADol (ULTRAM) tablet 50 mg  50 mg Oral Q6H PRN Zierle-Ghosh, Asia B, DO        Allergies as of 08/20/2018  . (No Known Allergies)    Family History  Problem Relation Age of Onset  . Hypertension Mother   . Heart attack Father     Social History   Socioeconomic History  . Marital status: Single    Spouse name: Not on file  .  Number of children: 0  . Years of education: Not on file  . Highest education level: Not on file  Occupational History  . Occupation: Disabled since 1989  Social Needs  . Financial resource strain: Not on file  . Food insecurity    Worry: Not on file    Inability: Not on file  . Transportation needs    Medical: Not on file    Non-medical: Not on file  Tobacco Use  . Smoking status: Former Smoker    Packs/day: 0.30    Years: 60.00    Pack years: 18.00    Types: Cigars, Cigarettes    Start date: 01/18/1956    Quit   date: 11/25/2015    Years since quitting: 2.7  . Smokeless tobacco: Never Used  Substance and Sexual Activity  . Alcohol use: Yes    Alcohol/week: 0.0 standard drinks    Frequency: Never    Comment: Occasional  . Drug use: Yes    Types: Marijuana    Comment: daily  . Sexual activity: Never  Lifestyle  . Physical activity    Days per week: Not on file    Minutes per session: Not on file  . Stress: Not on file  Relationships  . Social connections    Talks on phone: Not on file    Gets together: Not on file    Attends religious service: Not on file    Active member of club or organization: Not on file    Attends meetings of clubs or organizations: Not on file    Relationship status: Not on file  . Intimate partner violence    Fear of current or ex partner: Not on file    Emotionally abused: Not on file    Physically abused: Not on file    Forced sexual activity: Not on file  Other Topics Concern  . Not on file  Social History Narrative   No regular exercise    Review of Systems: Gen: Denies fever, chills, loss of appetite, change in weight or weight loss CV: Denies chest pain, heart palpitations, syncope, edema  Resp: Denies shortness of breath with rest, cough, wheezing GI: see HPI GU : Denies urinary burning, urinary frequency, urinary incontinence.  MS: Denies joint pain,swelling, cramping Derm: Denies rash, itching, dry skin Psych:  +Depression Heme: see HPI  Physical Exam: Vital signs in last 24 hours: Temp:  [97.5 F (36.4 C)-98.1 F (36.7 C)] 97.5 F (36.4 C) (08/04 0541) Pulse Rate:  [45-81] 80 (08/04 0541) Resp:  [11-20] 16 (08/04 0541) BP: (113-192)/(72-96) 126/83 (08/04 0541) SpO2:  [79 %-100 %] 93 % (08/04 0541) Weight:  [64.4 kg-72.6 kg] 64.4 kg (08/04 0541)   General:   Alert,  Well-developed, well-nourished, pleasant and cooperative in NAD Head:  Normocephalic and atraumatic. Eyes:  Sclera clear, no icterus.   Conjunctiva pink. Ears:  Normal auditory acuity. Lungs:  Clear throughout to auscultation.    Cardiac: S1 S2 present, no obvious murmur  Abdomen:  Soft, nontender and nondistended. No masses, hepatosplenomegaly or hernias noted. Normal bowel sounds, without guarding, and without rebound.   Rectal:  Deferred  Msk:  Left AKA  Extremities:  Without edema. Left AKA Neurologic:  Alert and  oriented x4 Psych:  Alert and cooperative. Normal mood and affect.  Intake/Output from previous day: 08/03 0701 - 08/04 0700 In: 155.1 [I.V.:155.1] Out: -  Intake/Output this shift: No intake/output data recorded.  Lab Results: Recent Labs    08/20/18 1924 08/21/18 0126 08/21/18 0729  WBC 13.2*  --  13.0*  HGB 16.5 15.0 14.7  HCT 51.0 46.7 46.4  PLT 281  --  251   BMET Recent Labs    08/20/18 1924  NA 134*  K 4.0  CL 100  CO2 19*  GLUCOSE 209*  BUN 11  CREATININE 0.99  CALCIUM 9.5   LFT Recent Labs    08/20/18 1924  PROT 8.4*  ALBUMIN 4.5  AST 22  ALT 16  ALKPHOS 71  BILITOT 0.7   PT/INR Recent Labs    08/20/18 1924  LABPROT 13.3  INR 1.0    Studies/Results: Dg Chest 1 View  Result Date: 08/20/2018 CLINICAL DATA:    Severe abdominal pain EXAM: CHEST  1 VIEW COMPARISON:  11/10/2017 FINDINGS: Cardiac shadow is within normal limits. Postsurgical changes are again seen. Mild interstitial changes are noted bilaterally without focal infiltrate or sizable effusion. No bony  abnormality is noted. IMPRESSION: No acute abnormality seen. Electronically Signed   By: Mark  Lukens M.D.   On: 08/20/2018 19:42   Ct Angio Abd/pel W And/or Wo Contrast  Result Date: 08/20/2018 CLINICAL DATA:  Abdominal pain and hematemesis for several hours, history of known abdominal aneurysm. EXAM: CTA ABDOMEN AND PELVIS WITH CONTRAST TECHNIQUE: Multidetector CT imaging of the abdomen and pelvis was performed using the standard protocol during bolus administration of intravenous contrast. Multiplanar reconstructed images and MIPs were obtained and reviewed to evaluate the vascular anatomy. CONTRAST:  100mL OMNIPAQUE IOHEXOL 350 MG/ML SOLN COMPARISON:  06/26/2018 FINDINGS: VASCULAR Aorta: Abdominal aorta demonstrates evidence of the known infrarenal aortic aneurysm. It measures approximately 5.4 cm in greatest dimension using similar measurements as that on the prior exam. Considerable mural thrombus is noted. Mild irregularity of the mural thrombus is noted best seen on image number 82 of series 4. This may be related to some swirling of the contrast within the lumen. No findings to suggest impending rupture are identified at this time. Tapering at the level of the aortic bifurcation is noted similar to that seen on the prior exam. Celiac: Mild atherosclerotic calcifications are noted with 50% stenosis at the origin. The more distal branches are within normal limits. SMA: Patent without evidence of aneurysm, dissection, vasculitis or significant stenosis. Renals: Both renal arteries are patent without evidence of aneurysm, dissection, vasculitis, fibromuscular dysplasia or significant stenosis. IMA: The IMA is patent just beyond its origin related to collateralization. The orifice of the IMA is occluded secondary to the mural thrombus anteriorly. Iliacs and outflow: Atherosclerotic changes are noted within the iliac arteries bilaterally without aneurysmal dilatation. The superficial femoral artery on the left  is occluded just beyond its origin. The left internal iliac artery is again occluded. Veins: No venous abnormality is noted. Review of the MIP images confirms the above findings. NON-VASCULAR Lower chest: Mild scarring is noted in the bases bilaterally. No focal infiltrate is seen. Hepatobiliary: No focal liver abnormality is seen. No gallstones, gallbladder wall thickening, or biliary dilatation. Pancreas: Unremarkable. No pancreatic ductal dilatation or surrounding inflammatory changes. Spleen: Normal in size without focal abnormality. Adrenals/Urinary Tract: Stable right adrenal nodule is again seen. Left adrenal gland is unremarkable. Kidneys demonstrate no obstructive changes. The bladder is well distended. Stomach/Bowel: The appendix is within normal limits. Colon is decompressed. No obstructive or inflammatory changes of the small bowel are seen. The stomach is within normal limits with the exception of a sliding-type hiatal hernia. Lymphatic: No significant lymphadenopathy is identified. Reproductive: Prostate is unremarkable. Other: No abdominal wall hernia or abnormality. No abdominopelvic ascites. Musculoskeletal: Atrophic changes are noted in the musculature of the left thigh consistent with the [name]'s known history of prior left AKA. Degenerative changes of lumbar spine are seen. IMPRESSION: VASCULAR Stable size of the abdominal aortic aneurysm. Slight variation in the measurement is noted although likely related to technical variation in the imaging. 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 NON-VASCULAR Chronic changes as described above.  No acute abnormality noted. Electronically Signed   By: Mark  Lukens M.D.     On: 08/20/2018 19:41    Impression: 67-year-old male presenting with acute onset  of epigastric abdominal pain and associated vomiting, with coffee-ground emesis after multiple episodes. Hemoglobin 14.7 today, no melena, and clinically improved. Endorses Aleve several times per week and takes 81 mg aspirin daily. No PPI. Seems most consistent with MW tear but unable to rule out gastritis, PUD. Notable history of abdominal aortic aneurysm (5.6 cm) that has been evaluated by Vasc Surgery with plans for endovascular aneurysm repair in future after cardiac clearance. I do see where he saw cardiology in Feb 2020 and had low risk nuclear stress test Dec 2019, with other chronic conditions stable.   Will provide clear liquids today and plan for EGD with Propofol tomorrow with Dr. Rourk. Risks and benefits discussed with [name], and he is agreeable to proceed.   Plan: Follow H/H Clear liquids today Continue PPI NPO after midnight EGD with Propofol on 08/22/2018 Avoid NSAIDs   Violia Knopf W. Benisha Hadaway, PhD, ANP-BC [facility]     LOS: 0 days    08/21/2018, 10:15 AM    

## 2018-08-22 ENCOUNTER — Observation Stay (HOSPITAL_COMMUNITY): Payer: Medicare Other | Admitting: Anesthesiology

## 2018-08-22 ENCOUNTER — Encounter (HOSPITAL_COMMUNITY)
Admission: EM | Disposition: A | Discharge status: Home / Self Care | Payer: Self-pay | Source: Home / Self Care | Attending: Internal Medicine

## 2018-08-22 ENCOUNTER — Other Ambulatory Visit: Payer: Self-pay | Admitting: *Deleted

## 2018-08-22 ENCOUNTER — Encounter (HOSPITAL_COMMUNITY): Payer: Self-pay | Admitting: *Deleted

## 2018-08-22 DIAGNOSIS — K449 Diaphragmatic hernia without obstruction or gangrene: Secondary | ICD-10-CM

## 2018-08-22 DIAGNOSIS — K209 Esophagitis, unspecified: Secondary | ICD-10-CM | POA: Diagnosis not present

## 2018-08-22 DIAGNOSIS — K226 Gastro-esophageal laceration-hemorrhage syndrome: Secondary | ICD-10-CM | POA: Diagnosis not present

## 2018-08-22 DIAGNOSIS — R1013 Epigastric pain: Secondary | ICD-10-CM | POA: Diagnosis not present

## 2018-08-22 DIAGNOSIS — K920 Hematemesis: Secondary | ICD-10-CM | POA: Diagnosis not present

## 2018-08-22 HISTORY — PX: ESOPHAGOGASTRODUODENOSCOPY (EGD) WITH PROPOFOL: SHX5813

## 2018-08-22 LAB — CBC
HCT: 51.2 % (ref 39.0–52.0)
Hemoglobin: 16 g/dL (ref 13.0–17.0)
MCH: 28.6 pg (ref 26.0–34.0)
MCHC: 31.3 g/dL (ref 30.0–36.0)
MCV: 91.6 fL (ref 80.0–100.0)
Platelets: 249 10*3/uL (ref 150–400)
RBC: 5.59 MIL/uL (ref 4.22–5.81)
RDW: 14.2 % (ref 11.5–15.5)
WBC: 13.6 10*3/uL — ABNORMAL HIGH (ref 4.0–10.5)
nRBC: 0 % (ref 0.0–0.2)

## 2018-08-22 LAB — BASIC METABOLIC PANEL
Anion gap: 12 (ref 5–15)
BUN: 11 mg/dL (ref 8–23)
CO2: 24 mmol/L (ref 22–32)
Calcium: 9.4 mg/dL (ref 8.9–10.3)
Chloride: 102 mmol/L (ref 98–111)
Creatinine, Ser: 0.85 mg/dL (ref 0.61–1.24)
GFR calc Af Amer: >60 mL/min (ref >60)
GFR calc non Af Amer: >60 mL/min (ref >60)
Glucose, Bld: 133 mg/dL — ABNORMAL HIGH (ref 70–99)
Potassium: 4.7 mmol/L (ref 3.5–5.1)
Sodium: 138 mmol/L (ref 135–145)

## 2018-08-22 LAB — GLUCOSE, CAPILLARY: Glucose-Capillary: 130 mg/dL — ABNORMAL HIGH (ref 70–99)

## 2018-08-22 SURGERY — ESOPHAGOGASTRODUODENOSCOPY (EGD) WITH PROPOFOL
Anesthesia: General

## 2018-08-22 MED ORDER — LACTATED RINGERS IV SOLN
INTRAVENOUS | Status: DC | PRN
  Administered 2018-08-22: 14:00:00 via INTRAVENOUS

## 2018-08-22 MED ORDER — PROPOFOL 10 MG/ML IV BOLUS
INTRAVENOUS | Status: DC | PRN
  Administered 2018-08-22: 200 mg via INTRAVENOUS

## 2018-08-22 MED ORDER — METOPROLOL TARTRATE 5 MG/5ML IV SOLN
INTRAVENOUS | Status: DC | PRN
  Administered 2018-08-22: 2 mg via INTRAVENOUS
  Administered 2018-08-22: 1 mg via INTRAVENOUS

## 2018-08-22 MED ORDER — SUCRALFATE 1 GM/10ML PO SUSP
1.0000 g | Freq: Three times a day (TID) | ORAL | Status: DC
  Administered 2018-08-22 – 2018-08-23 (×3): 1 g via ORAL
  Filled 2018-08-22 (×3): qty 10

## 2018-08-22 MED ORDER — KETAMINE HCL 50 MG/5ML IJ SOSY
PREFILLED_SYRINGE | INTRAMUSCULAR | Status: AC
  Filled 2018-08-22: qty 5

## 2018-08-22 MED ORDER — LACTATED RINGERS IV SOLN
Freq: Once | INTRAVENOUS | Status: AC
  Administered 2018-08-22: 1000 mL via INTRAVENOUS

## 2018-08-22 MED ORDER — LIDOCAINE HCL 1 % IJ SOLN
INTRAMUSCULAR | Status: DC | PRN
  Administered 2018-08-22: 50 mg via INTRADERMAL

## 2018-08-22 MED ORDER — SODIUM CHLORIDE 0.9 % IV SOLN: INTRAVENOUS | Status: DC

## 2018-08-22 MED ORDER — ESMOLOL HCL 100 MG/10ML IV SOLN
INTRAVENOUS | Status: DC | PRN
  Administered 2018-08-22: 30 mg via INTRAVENOUS
  Administered 2018-08-22: 70 mg via INTRAVENOUS

## 2018-08-22 MED ORDER — PROPOFOL 10 MG/ML IV BOLUS
INTRAVENOUS | Status: AC
  Filled 2018-08-22: qty 40

## 2018-08-22 MED ORDER — METOCLOPRAMIDE HCL 5 MG/ML IJ SOLN
10.0000 mg | Freq: Once | INTRAMUSCULAR | Status: DC
  Filled 2018-08-22: qty 2

## 2018-08-22 MED ORDER — PROPOFOL 10 MG/ML IV BOLUS
INTRAVENOUS | Status: AC
  Filled 2018-08-22: qty 20

## 2018-08-22 MED ORDER — SUCCINYLCHOLINE CHLORIDE 20 MG/ML IJ SOLN
INTRAMUSCULAR | Status: DC | PRN
  Administered 2018-08-22: 120 mg via INTRAVENOUS

## 2018-08-22 NOTE — Anesthesia Procedure Notes (Signed)
Procedure Name: Intubation Date/Time: 08/22/2018 2:21 PM Performed by: Andree Elk, Khamarion Bjelland A, CRNA Pre-anesthesia Checklist: Patient identified, Emergency Drugs available, Suction available and Patient being monitored Patient Re-evaluated:Patient Re-evaluated prior to induction Oxygen Delivery Method: Circle system utilized Preoxygenation: Pre-oxygenation with 100% oxygen Induction Type: IV induction and Cricoid Pressure applied Grade View: Grade I Tube size: 8.0 mm Number of attempts: 1 Airway Equipment and Method: Rigid stylet and Video-laryngoscopy Placement Confirmation: ETT inserted through vocal cords under direct vision,  positive ETCO2 and breath sounds checked- equal and bilateral Secured at: 22 cm Tube secured with: Tape Dental Injury: Teeth and Oropharynx as per pre-operative assessment

## 2018-08-22 NOTE — Plan of Care (Signed)
  Problem: Education: Goal: Knowledge of General Education information will improve Description Including pain rating scale, medication(s)/side effects and non-pharmacologic comfort measures Outcome: Progressing   Problem: Health Behavior/Discharge Planning: Goal: Ability to manage health-related needs will improve Outcome: Progressing   

## 2018-08-22 NOTE — Op Note (Signed)
[facility] Patient Name: Sean FussWillie Rosenwald Procedure Date: 08/22/2018 1:49 PM MRN: [id number] Date of Birth: 07-14-1950 Attending MD: Gennette Pacobert Michael Ioanna Colquhoun , MD CSN: [id number] Age: [age] Admit Type: Inpatient Procedure:                Upper GI endoscopy Indications:              Coffee-ground emesis Providers:                Gennette Pacobert Michael Damika Harmon, MD, Buel ReamAngela A. Thomasena Edisollins RN, RN,                            Pandora Leiteravid Neville Tech., Technician, Dyann Ruddleonya Wilson Referring MD:              Medicines:                General Anesthesia Complications:            No immediate complications. Estimated Blood Loss:     Estimated blood loss: none. Procedure:                Pre-Anesthesia Assessment:                           - Prior to the procedure, a History and Physical                            was performed, and patient medications and                            allergies were reviewed. The patient's tolerance of                            previous anesthesia was also reviewed. The risks                            and benefits of the procedure and the sedation                            options and risks were discussed with the patient.                            All questions were answered, and informed consent                            was obtained. Prior Anticoagulants: The patient has                            taken no previous anticoagulant or antiplatelet                            agents. ASA Grade Assessment: III - A patient with                            severe systemic disease. After reviewing the risks  and benefits, the patient was deemed in                            satisfactory condition to undergo the procedure.                           After obtaining informed consent, the endoscope was                            passed under direct vision. Throughout the                            procedure, the patient's blood pressure, pulse, and       oxygen saturations were monitored continuously. The                            [id number]-[id number] ([id number]) was introduced through the                            mouth, and advanced to the second part of duodenum.                            The upper GI endoscopy was accomplished without                            difficulty. The patient tolerated the procedure                            well. Scope In: 2:24:51 PM Scope Out: 2:33:54 PM Total Procedure Duration: 0 hours 9 minutes 3 seconds  Findings:      Esophagitis was found. Circumferential erosions/ ulcerations at the EG       junction extending proximally in a "geographic pattern", extending 6 cm       above the GE junction. No Barrett's epithelium seen. Adherent clot at       the GE junction. No active bleeding seen.      Stomach empty. A small hiatal hernia was present.      The exam of the stomach was otherwise normal.      The duodenal bulb and second portion of the duodenum were normal. 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 No therapeutic intervention                            required.                           - Small hiatal hernia; otherwise normal stomach.                           - Normal duodenal bulb and second portion of the  duodenum.                           - No specimens collected. Moderate Sedation:      Moderate (conscious) sedation was personally administered by an       anesthesia professional. The following parameters were monitored: oxygen       saturation, heart rate, blood pressure, respiratory rate, EKG, adequacy       of pulmonary ventilation, and response to care. Recommendation:           - Return patient to hospital ward for ongoing care.                           - Clear liquid diet. Continue Protonix 40 mg twice                            daily for the next month then decrease to once                             daily thereafter. Carafate suspension 1 g before                            meals and at bedtime x5 days                           - Continue present medications. Hopefully, will be                            able to advance diet in the morning. Procedure Code(s):        --- Professional ---                           [id number], Esophagogastroduodenoscopy, flexible,                            transoral; diagnostic, including collection of                            specimen(s) by brushing or washing, when performed                            (separate procedure) Diagnosis Code(s):        --- Professional ---                           K20.9, Esophagitis, unspecified                           K44.9, Diaphragmatic hernia without obstruction or                            gangrene                           K92.0, Hematemesis CPT copyright 2019 [employer] Association. All rights reserved. The codes documented in this report are preliminary and upon coder review  may  be revised to meet current compliance requirements. Gerrit Friendsobert M. Carolyn Sylvia, MD Gennette Pacobert Michael Jerred Zaremba, MD 08/22/2018 2:48:01 PM This report has been signed electronically. Number of Addenda: 0

## 2018-08-22 NOTE — Transfer of Care (Signed)
Immediate Anesthesia Transfer of Care Note  Patient: [name]  Procedure(s) Performed: ESOPHAGOGASTRODUODENOSCOPY (EGD) WITH PROPOFOL (N/A )  Patient Location: PACU  Anesthesia Type:General  Level of Consciousness: awake, alert , oriented and patient cooperative  Airway & Oxygen Therapy: Patient Spontanous Breathing and Patient connected to face mask oxygen  Post-op Assessment: Report given to RN and Post -op Vital signs reviewed and stable  Post vital signs: Reviewed and stable  Last Vitals:  Vitals Value Taken Time  BP 141/93 08/22/18 1447  Temp    Pulse 64 08/22/18 1450  Resp 32 08/22/18 1450  SpO2 100 % 08/22/18 1450  Vitals shown include unvalidated device data.  Last Pain:  Vitals:   08/22/18 1319  TempSrc: Oral  PainSc: 5       Patients Stated Pain Goal: 8 (77/11/65 7903)  Complications: No apparent anesthesia complications

## 2018-08-22 NOTE — Anesthesia Postprocedure Evaluation (Signed)
Anesthesia Post Note  Patient: [name]  Procedure(s) Performed: ESOPHAGOGASTRODUODENOSCOPY (EGD) WITH PROPOFOL (N/A )  Patient location during evaluation: PACU Anesthesia Type: General Level of consciousness: awake and patient cooperative Pain management: pain level controlled Vital Signs Assessment: post-procedure vital signs reviewed and stable Respiratory status: spontaneous breathing Cardiovascular status: stable Postop Assessment: no apparent nausea or vomiting Anesthetic complications: no     Last Vitals:  Vitals:   08/22/18 1319 08/22/18 1450  BP: (!) 176/100 (!) 141/93  Pulse:  64  Resp: 20 (!) 32  Temp: 36.4 C 36.5 C  SpO2: 98% 100%    Last Pain:  Vitals:   08/22/18 1450  TempSrc:   PainSc: Asleep                 ADAMS, AMY A

## 2018-08-22 NOTE — Interval H&P Note (Signed)
History and Physical Interval Note:  08/22/2018 2:02 PM  [name]  has presented today for surgery, with the diagnosis of hematemesis, abdominal pain.  The various methods of treatment have been discussed with the patient and family. After consideration of risks, benefits and other options for treatment, the patient has consented to  Procedure(s): ESOPHAGOGASTRODUODENOSCOPY (EGD) WITH PROPOFOL (N/A) as a surgical intervention.  The patient's history has been reviewed, patient examined, no change in status, stable for surgery.  I have reviewed the patient's chart and labs.  Questions were answered to the patient's satisfaction.     [name]  Seen in short stay.  Hemoglobin 16 this morning.  Recurrent nausea and vomiting.  Discussed with anesthesia.  Best practice will be to intubate for EGD.  The risks, benefits, limitations, alternatives and imponderables have been reviewed with the patient. Potential for esophageal dilation, biopsy, etc. have also been reviewed.  Questions have been answered. All parties agreeable.  Further recommendations to follow.

## 2018-08-22 NOTE — Progress Notes (Signed)
Held morning meds because patient was nauseous and wanted to sleep.

## 2018-08-22 NOTE — Anesthesia Preprocedure Evaluation (Addendum)
Anesthesia Evaluation  Patient identified by MRN, date of birth, ID band Patient awake    Reviewed: Allergy & Precautions, NPO status , Patient's Chart, lab work & pertinent test results, reviewed documented beta blocker date and time   Airway Mallampati: III  TM Distance: >3 FB Neck ROM: Full    Dental  (+) Poor Dentition, Missing,    Pulmonary former smoker,    Pulmonary exam normal breath sounds clear to auscultation       Cardiovascular Exercise Tolerance: Good hypertension (patient did not receive metoprolol today, will give betablocker in the procedure room), Pt. on medications and Pt. on home beta blockers + CAD, + Past MI, + Cardiac Stents and + Peripheral Vascular Disease (AAA - 5.6cm)  + Valvular Problems/Murmurs (AVR - 2019) AS  Rhythm:Regular Rate:Normal  Study Conclusions  - Left ventricle: The cavity size was normal. Wall thickness was   increased in a pattern of moderate LVH. Systolic function was   normal. The estimated ejection fraction was in the range of 60%   to 65%. Wall motion was normal; there were no regional wall   motion abnormalities. Doppler parameters are consistent with   abnormal left ventricular relaxation (grade 1 diastolic   dysfunction). - Aortic valve: Mean gradient (S): 9 mm Hg. Valve area (VTI): 1.41   cm^2. Valve area (Vmax): 1.29 cm^2. Valve area (Vmean): 1.5 cm^2. - Mitral valve: Mildly calcified annulus. Mildly thickened leaflets   . - Right ventricle: Systolic function was mildly to moderately   reduced. TAPSE and tissue anular velocity suggest severe RV   dysfunction, visually appears mild to moderate. - Atrial septum: No defect or patent foramen ovale was identified. cardiac cath - 2018 -  Mid RCA lesion, 40 %stenosed.  Dist RCA lesion, 30 %stenosed.  Ost RPDA lesion, 20 %stenosed.  Ost LM lesion, 20 %stenosed.  Mid Cx lesion, 100 %stenosed.  There is severe aortic valve  stenosis.  Mid LAD to Dist LAD lesion, 10 %stenosed.   1. Mild non-obstructive disease in the RCA, LAD and intermediate branches.  2. 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  3. Severe aortic valve stenosis (mean gradient 31 mmHg, peak to peak gradient 27 mmHg, AVA 0.62 cm2)    Neuro/Psych negative neurological ROS  negative psych ROS   GI/Hepatic GERD  ,(+)     substance abuse  marijuana use,   Endo/Other  diabetes, Type 2  Renal/GU      Musculoskeletal  (+) Arthritis , Osteoarthritis,    Abdominal   Peds  Hematology   Anesthesia Other Findings [name] is a 68 y.o. male with history of CAD, diabetes, known AAA last measured at 5.6 cm with scheduled repair later this month presents to the ED with severe, sudden abdominal pain that began around 2 PM.  He has had associated hematemesis x4.  He has had a total of emesis x8.  He reports he woke up feeling fine.  He has not eaten anything today.  Last meal was fried fish cooked at home that he caught himself. Someone who knows him found him lying on his front porch and EMS was called.  Patient denies any chest pain or shortness of breath, fever, urinary symptoms, diarrhea, bloody stools.  Patient denies drinking alcohol regularly.  He states he only drinks occasionally.  Patient does report that he smokes marijuana,  Reproductive/Obstetrics  Anesthesia Physical Anesthesia Plan  ASA: IV and emergent  Anesthesia Plan: General   Post-op Pain Management:    Induction: Intravenous  PONV Risk Score and Plan:   Airway Management Planned: Oral ETT  Additional Equipment:   Intra-op Plan:   Post-operative Plan: Possible Post-op intubation/ventilation  Informed Consent: I have reviewed the patients History and Physical, chart, labs and discussed the procedure including the risks, benefits and alternatives for the proposed anesthesia with the  patient or authorized representative who has indicated his/her understanding and acceptance.     Dental advisory given  Plan Discussed with: CRNA  Anesthesia Plan Comments: (Risk of aspiration, postop ventilation, and risk of aortic aneurysm rupture was discussed with the patient.)        Anesthesia Quick Evaluation

## 2018-08-22 NOTE — Progress Notes (Signed)
PROGRESS NOTE    [name]  WUJ:[id number] DOB: 07-Oct-1950 DOA: 08/20/2018 PCP: Carylon PerchesFagan, Roy, MD     Brief Narrative:  68 y.o.malewith history of CAD, diabetes, known AAA last measured at 5.6 cm with scheduled repair later this month presents to the ED with severe, sudden abdominal pain that began around 2 PM. He has had associated hematemesis x4.  He has had a total of emesis x8. He reports he woke up feeling fine. He has not eaten anything today.  Last meal was fried fish cooked at home that he caught himself.Someone who knows him found him lying on his front porch and EMS was called. Patient denies any chest pain or shortness of breath, fever, urinary symptoms, diarrhea, bloody stools. Patient denies drinking alcohol regularly. He states he only drinks occasionally.  Patient does report that he smokes marijuana, but does not associate his abdominal pain with that.  Patient reports that he has had this happen to him before about 3 months ago.  That visit to the ED was reviewed and at that time he was not complaining of hematemesis but just diarrhea and emesis.  Patient describes his emesis as spit with color and it.  He reports that initially it was white frothy mouthful, and then progressed to white frothy with more more red in it.  Each episode of emesis was approximately "1 mouthful."  He has not had any lightheadedness, paresthesias, shortness of breath, or other symptoms of anemia.  Since he has been to the ED he has not had any episode of hematemesis or emesis.  Patient describes abdominal pain as sharp, and in the center of his stomach.  It waxes and wanes unpredictably.  Episodes of emesis did not improve or worsen the pain.  Pain improved with treatment in the ED.  Assessment & Plan: 1-hematemesis -Most likely associated with upper GI bleed -Differential diagnosis includes Mallory-Weiss, esophagitis, gastritis and peptic ulcer disease. -Continue IV PPI -Follow hemoglobin  trend -No transfusion needed -GI service on board and planning endoscopy procedure later today for further evaluation and management. -Patient still actively vomiting but no further blood seen in the gastric content -Continue as needed antiemetics and analgesics.  2-hyponatremia/dehydration  -continue fluid resuscitation -Follow electrolytes trend.  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 -Hold oral hypoglycemic agents while inpatient.  4-hyperlipidemia -Continue Zocor  5-BPH -Continue Flomax  6-hypertension -Continue the use of metoprolol and lisinopril. -Vital signs stable -Blood pressure stable and well-controlled; will follow vital signs.    DVT prophylaxis: SCDs Code Status: Full code Family Communication: No family at bedside. Disposition Plan: Give 1 dose of Reglan to assist with ongoing nausea/vomiting process.  Continue PRN antiemetics and analgesics.  Remain n.p.o.  Follow-up endoscopy results and GI recommendations.  Continue IV PPI.  Hemoglobin is stable transfusion as needed.  Consultants:   GI service  Procedures:   EGD planned for later today a 520  Antimicrobials:  Anti-infectives (From admission, onward)   None      Subjective: Afebrile, no chest pain, no shortness of breath, no diaphoresis.  Patient reports ongoing midepigastric discomfort, active nausea and ongoing vomiting.  Objective: Vitals:   08/22/18 1319 08/22/18 1450 08/22/18 1500 08/22/18 1515  BP: (!) 176/100 (!) 141/93    Pulse:  64 60 (!) 59  Resp: 20 (!) 32 (!) 25 14  Temp: 97.6 F (36.4 C) 97.7 F (36.5 C)    TempSrc:  Oral     SpO2: 98% 100% 93% 97%  Weight:      Height:        Intake/Output Summary (Last 24 hours) at 08/22/2018 1517 Last data filed at 08/22/2018 1450 Gross per 24 hour  Intake 800 ml  Output 1100 ml  Net -300 ml   Filed Weights   08/20/18  1828 08/21/18 0022 08/21/18 0541  Weight: 72.6 kg 64.4 kg 64.4 kg    Examination: General exam: Alert, awake, oriented x 3; still mild distress secondary to ongoing midepigastric pain, active nausea and ongoing vomiting. Respiratory system: Clear to auscultation. Respiratory effort normal. Cardiovascular system:RRR. No murmurs, rubs, gallops. Gastrointestinal system: Abdomen is nondistended, soft and tender to palpation in mid epigastric region. No organomegaly or masses felt. Normal bowel sounds heard. Central nervous system: Alert and oriented. No focal neurological deficits. Extremities: No C/C/E, +pedal pulses Skin: No rashes, lesions or ulcers Psychiatry: Judgement and insight appear normal. Mood & affect appropriate.     Data Reviewed: I have personally reviewed following labs and imaging studies  CBC: Recent Labs  Lab 08/20/18 1924 08/21/18 0126 08/21/18 0729 08/22/18 0612  WBC 13.2*  --  13.0* 13.6*  NEUTROABS 12.0*  --   --   --   HGB 16.5 15.0 14.7 16.0  HCT 51.0 46.7 46.4 51.2  MCV 90.3  --  90.3 91.6  PLT 281  --  251 249   Basic Metabolic Panel: Recent Labs  Lab 08/20/18 1924 08/21/18 0729 08/22/18 0612  NA 134*  --  138  K 4.0  --  4.7  CL 100  --  102  CO2 19*  --  24  GLUCOSE 209*  --  133*  BUN 11  --  11  CREATININE 0.99  --  0.85  CALCIUM 9.5  --  9.4  MG  --  1.9  --   PHOS  --  4.0  --    GFR: Estimated Creatinine Clearance: 76.8 mL/min (by C-G formula based on SCr of 0.85 mg/dL). Liver Function Tests: Recent Labs  Lab 08/20/18 1924  AST 22  ALT 16  ALKPHOS 71  BILITOT 0.7  PROT 8.4*  ALBUMIN 4.5   Recent Labs  Lab 08/20/18 1924  LIPASE 27   Coagulation Profile: Recent Labs  Lab 08/20/18 1924  INR 1.0   HbA1C: Recent Labs    08/21/18 0126  HGBA1C 6.8*   CBG: Recent Labs  Lab 08/22/18 1325  GLUCAP 130*   Urine analysis:    Component Value Date/Time   COLORURINE AMBER (A) 02/01/2018 1108   APPEARANCEUR CLOUDY  (A) 02/01/2018 1108   LABSPEC 1.017 02/01/2018 1108   PHURINE 6.0 02/01/2018 1108   GLUCOSEU 50 (A) 02/01/2018 1108   GLUCOSEU neg 02/03/2009 1250   HGBUR LARGE (A) 02/01/2018 1108   BILIRUBINUR NEGATIVE 02/01/2018 1108   KETONESUR NEGATIVE 02/01/2018 1108   PROTEINUR 100 (A) 02/01/2018 1108   NITRITE NEGATIVE 02/01/2018 1108   LEUKOCYTESUR MODERATE (A) 02/01/2018 1108    Recent Results (from the past 240 hour(s))  SARS Coronavirus 2 [facility](Hospital order, [facility])     Status: None   Collection Time: 08/20/18  7:45 PM  Result Value Ref Range Status   SARS Coronavirus 2 NEGATIVE NEGATIVE Final    Comment: (NOTE) If result is NEGATIVE SARS-CoV-2 target nucleic acids are NOT DETECTED. The SARS-CoV-2 RNA is generally detectable in upper and lower  respiratory specimens during the acute phase of  infection. The lowest  concentration of SARS-CoV-2 viral copies this assay can detect is 250  copies / mL. A negative result does not preclude SARS-CoV-2 infection  and should not be used as the sole basis for treatment or other  patient management decisions.  A negative result may occur with  improper specimen collection / handling, submission of specimen other  than nasopharyngeal swab, presence of viral mutation(s) within the  areas targeted by this assay, and inadequate number of viral copies  (<250 copies / mL). A negative result must be combined with clinical  observations, patient history, and epidemiological information. If result is POSITIVE SARS-CoV-2 target nucleic acids are DETECTED. The SARS-CoV-2 RNA is generally detectable in upper and lower  respiratory specimens dur ing the acute phase of infection.  Positive  results are indicative of active infection with SARS-CoV-2.  Clinical  correlation with patient history and other diagnostic information is  necessary to determine patient infection status.  Positive results do  not rule out bacterial infection  or co-infection with other viruses. If result is PRESUMPTIVE POSTIVE SARS-CoV-2 nucleic acids MAY BE PRESENT.   A presumptive positive result was obtained on the submitted specimen  and confirmed on repeat testing.  While 2019 novel coronavirus  (SARS-CoV-2) nucleic acids may be present in the submitted sample  additional confirmatory testing may be necessary for epidemiological  and / or clinical management purposes  to differentiate between  SARS-CoV-2 and other Sarbecovirus currently known to infect humans.  If clinically indicated additional testing with an alternate test  methodology [phone]) is advised. The SARS-CoV-2 RNA is generally  detectable in upper and lower respiratory sp ecimens during the acute  phase of infection. The expected result is Negative. Fact Sheet for Patients:  StrictlyIdeas.no Fact Sheet for Healthcare Providers: BankingDealers.co.za This test is not yet approved or cleared by the [place] FDA and has been authorized for detection and/or diagnosis of SARS-CoV-2 by FDA under an Emergency Use Authorization (EUA).  This EUA will remain in effect (meaning this test can be used) for the duration of the COVID-19 declaration under Section 564(b)(1) of the Act, 21 U.S.C. section 360bbb-3(b)(1), unless the authorization is terminated or revoked sooner. Performed at [facility], [address], [place], [place] [address]      Radiology Studies: Dg Chest 1 View  Result Date: 08/20/2018 CLINICAL DATA:  Severe abdominal pain EXAM: CHEST  1 VIEW COMPARISON:  11/10/2017 FINDINGS: Cardiac shadow is within normal limits. Postsurgical changes are again seen. Mild interstitial changes are noted bilaterally without focal infiltrate or sizable effusion. No bony abnormality is noted. IMPRESSION: No acute abnormality seen. Electronically Signed   By: Inez Catalina M.D.   On: 08/20/2018 19:42   Ct Angio Abd/pel W And/or Wo  Contrast  Result Date: 08/20/2018 CLINICAL DATA:  Abdominal pain and hematemesis for several hours, history of known abdominal aneurysm. EXAM: CTA ABDOMEN AND PELVIS WITH CONTRAST TECHNIQUE: Multidetector CT imaging of the abdomen and pelvis was performed using the standard protocol during bolus administration of intravenous contrast. Multiplanar reconstructed images and MIPs were obtained and reviewed to evaluate the vascular anatomy. CONTRAST:  138mL OMNIPAQUE IOHEXOL 350 MG/ML SOLN COMPARISON:  06/26/2018 FINDINGS: VASCULAR Aorta: Abdominal aorta demonstrates evidence of the known infrarenal aortic aneurysm. It measures approximately 5.4 cm in greatest dimension using similar measurements as that on the prior exam. Considerable mural thrombus is noted. Mild irregularity of the mural thrombus is noted best seen on image number 82 of series 4. This  may be related to some swirling of the contrast within the lumen. No findings to suggest impending rupture are identified at this time. Tapering at the level of the aortic bifurcation is noted similar to that seen on the prior exam. Celiac: Mild atherosclerotic calcifications are noted with 50% stenosis at the origin. The more distal branches are within normal limits. SMA: Patent without evidence of aneurysm, dissection, vasculitis or significant stenosis. Renals: Both renal arteries are patent without evidence of aneurysm, dissection, vasculitis, fibromuscular dysplasia or significant stenosis. IMA: The IMA is patent just beyond its origin related to collateralization. The orifice of the IMA is occluded secondary to the mural thrombus anteriorly. Iliacs and outflow: Atherosclerotic changes are noted within the iliac arteries bilaterally without aneurysmal dilatation. The superficial femoral artery on the left is occluded just beyond its origin. The left internal iliac artery is again occluded. Veins: No venous abnormality is noted. Review of the MIP images confirms the  above findings. NON-VASCULAR Lower chest: Mild scarring is noted in the bases bilaterally. No focal infiltrate is seen. Hepatobiliary: No focal liver abnormality is seen. No gallstones, gallbladder wall thickening, or biliary dilatation. Pancreas: Unremarkable. No pancreatic ductal dilatation or surrounding inflammatory changes. Spleen: Normal in size without focal abnormality. Adrenals/Urinary Tract: Stable right adrenal nodule is again seen. Left adrenal gland is unremarkable. Kidneys demonstrate no obstructive changes. The bladder is well distended. Stomach/Bowel: The appendix is within normal limits. Colon is decompressed. No obstructive or inflammatory changes of the small bowel are seen. The stomach is within normal limits with the exception of a sliding-type hiatal hernia. Lymphatic: No significant lymphadenopathy is identified. Reproductive: Prostate is unremarkable. Other: No abdominal wall hernia or abnormality. No abdominopelvic ascites. Musculoskeletal: Atrophic changes are noted in the musculature of the left thigh consistent with the patient's known history of prior left AKA. Degenerative changes of lumbar spine are seen. IMPRESSION: VASCULAR Stable size of the abdominal aortic aneurysm. Slight variation in the measurement is noted although likely related to technical variation in the imaging. 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 NON-VASCULAR Chronic changes as described above.  No acute abnormality noted. Electronically Signed   By: Alcide CleverMark  Lukens M.D.   On: 08/20/2018 19:41     Scheduled Meds:  [MAR Hold] lisinopril  5 mg Oral Daily   metoCLOPramide (REGLAN) injection  10 mg Intravenous Once   [MAR Hold] metoprolol tartrate  12.5 mg Oral BID   [MAR Hold] pantoprazole  40 mg Oral BID AC    [MAR Hold] simvastatin  40 mg Oral QHS   sucralfate  1 g Oral TID WC & HS   [MAR Hold] tamsulosin  0.4 mg Oral Daily   Continuous Infusions:  sodium chloride 75 mL/hr at 08/21/18 1415   sodium chloride       LOS: 0 days     Time spent: 30 minutes.   Vassie Lollarlos Hartlyn Reigel, MD [facility]  Pager [phone]    08/22/2018, 3:17 PM

## 2018-08-23 DIAGNOSIS — R001 Bradycardia, unspecified: Secondary | ICD-10-CM | POA: Diagnosis present

## 2018-08-23 DIAGNOSIS — R109 Unspecified abdominal pain: Secondary | ICD-10-CM | POA: Diagnosis not present

## 2018-08-23 DIAGNOSIS — E871 Hypo-osmolality and hyponatremia: Secondary | ICD-10-CM | POA: Diagnosis present

## 2018-08-23 DIAGNOSIS — Z794 Long term (current) use of insulin: Secondary | ICD-10-CM | POA: Diagnosis not present

## 2018-08-23 DIAGNOSIS — I10 Essential (primary) hypertension: Secondary | ICD-10-CM | POA: Diagnosis not present

## 2018-08-23 DIAGNOSIS — Z952 Presence of prosthetic heart valve: Secondary | ICD-10-CM | POA: Diagnosis not present

## 2018-08-23 DIAGNOSIS — Z7982 Long term (current) use of aspirin: Secondary | ICD-10-CM | POA: Diagnosis not present

## 2018-08-23 DIAGNOSIS — E785 Hyperlipidemia, unspecified: Secondary | ICD-10-CM | POA: Diagnosis present

## 2018-08-23 DIAGNOSIS — E1165 Type 2 diabetes mellitus with hyperglycemia: Secondary | ICD-10-CM | POA: Diagnosis present

## 2018-08-23 DIAGNOSIS — I252 Old myocardial infarction: Secondary | ICD-10-CM | POA: Diagnosis not present

## 2018-08-23 DIAGNOSIS — Z89612 Acquired absence of left leg above knee: Secondary | ICD-10-CM | POA: Diagnosis not present

## 2018-08-23 DIAGNOSIS — Z20828 Contact with and (suspected) exposure to other viral communicable diseases: Secondary | ICD-10-CM | POA: Diagnosis present

## 2018-08-23 DIAGNOSIS — I513 Intracardiac thrombosis, not elsewhere classified: Secondary | ICD-10-CM | POA: Diagnosis present

## 2018-08-23 DIAGNOSIS — R1013 Epigastric pain: Secondary | ICD-10-CM

## 2018-08-23 DIAGNOSIS — E860 Dehydration: Secondary | ICD-10-CM | POA: Diagnosis present

## 2018-08-23 DIAGNOSIS — I2510 Atherosclerotic heart disease of native coronary artery without angina pectoris: Secondary | ICD-10-CM | POA: Diagnosis present

## 2018-08-23 DIAGNOSIS — E1151 Type 2 diabetes mellitus with diabetic peripheral angiopathy without gangrene: Secondary | ICD-10-CM | POA: Diagnosis present

## 2018-08-23 DIAGNOSIS — K449 Diaphragmatic hernia without obstruction or gangrene: Secondary | ICD-10-CM | POA: Diagnosis present

## 2018-08-23 DIAGNOSIS — K922 Gastrointestinal hemorrhage, unspecified: Secondary | ICD-10-CM | POA: Diagnosis present

## 2018-08-23 DIAGNOSIS — K920 Hematemesis: Secondary | ICD-10-CM | POA: Diagnosis not present

## 2018-08-23 DIAGNOSIS — K2971 Gastritis, unspecified, with bleeding: Secondary | ICD-10-CM | POA: Diagnosis present

## 2018-08-23 DIAGNOSIS — Z951 Presence of aortocoronary bypass graft: Secondary | ICD-10-CM | POA: Diagnosis not present

## 2018-08-23 DIAGNOSIS — Z87891 Personal history of nicotine dependence: Secondary | ICD-10-CM | POA: Diagnosis not present

## 2018-08-23 DIAGNOSIS — N400 Enlarged prostate without lower urinary tract symptoms: Secondary | ICD-10-CM | POA: Diagnosis present

## 2018-08-23 DIAGNOSIS — K210 Gastro-esophageal reflux disease with esophagitis: Secondary | ICD-10-CM | POA: Diagnosis present

## 2018-08-23 DIAGNOSIS — Z8249 Family history of ischemic heart disease and other diseases of the circulatory system: Secondary | ICD-10-CM | POA: Diagnosis not present

## 2018-08-23 DIAGNOSIS — K226 Gastro-esophageal laceration-hemorrhage syndrome: Secondary | ICD-10-CM | POA: Diagnosis present

## 2018-08-23 DIAGNOSIS — I714 Abdominal aortic aneurysm, without rupture: Secondary | ICD-10-CM | POA: Diagnosis present

## 2018-08-23 LAB — GLUCOSE, CAPILLARY
Glucose-Capillary: 143 mg/dL — ABNORMAL HIGH (ref 70–99)
Glucose-Capillary: 151 mg/dL — ABNORMAL HIGH (ref 70–99)

## 2018-08-23 MED ORDER — ONDANSETRON HCL 4 MG/2ML IJ SOLN
4.0000 mg | Freq: Four times a day (QID) | INTRAMUSCULAR | Status: DC
  Administered 2018-08-23 – 2018-08-24 (×5): 4 mg via INTRAVENOUS
  Filled 2018-08-23 (×7): qty 2

## 2018-08-23 MED ORDER — METOCLOPRAMIDE HCL 5 MG/ML IJ SOLN
10.0000 mg | Freq: Once | INTRAMUSCULAR | Status: AC
  Administered 2018-08-23: 10 mg via INTRAVENOUS
  Filled 2018-08-23: qty 2

## 2018-08-23 MED ORDER — SUCRALFATE 1 GM/10ML PO SUSP
1.0000 g | Freq: Three times a day (TID) | ORAL | Status: DC
  Administered 2018-08-23 – 2018-08-24 (×4): 1 g via ORAL
  Filled 2018-08-23 (×4): qty 10

## 2018-08-23 MED ORDER — INSULIN ASPART 100 UNIT/ML ~~LOC~~ SOLN: 0.0000 [IU] | Freq: Every day | SUBCUTANEOUS | Status: DC

## 2018-08-23 MED ORDER — INSULIN ASPART 100 UNIT/ML ~~LOC~~ SOLN
0.0000 [IU] | Freq: Three times a day (TID) | SUBCUTANEOUS | Status: DC
  Administered 2018-08-24: 2 [IU] via SUBCUTANEOUS

## 2018-08-23 MED ORDER — ONDANSETRON HCL 4 MG/2ML IJ SOLN: 4.0000 mg | Freq: Three times a day (TID) | INTRAMUSCULAR | Status: DC

## 2018-08-23 NOTE — Progress Notes (Addendum)
Subjective:  Vomiting last night. 200cc. Didn't try to eat. This morning, clear liquid breakfast and vomited while I was in room with him. No coffee ground emesis or blood.   Objective: Vital signs in last 24 hours: Temp:  [97.6 F (36.4 C)-99 F (37.2 C)] 98.9 F (37.2 C) (08/06 0500) Pulse Rate:  [59-80] 62 (08/06 0507) Resp:  [14-32] 20 (08/06 0500) BP: (141-176)/(92-100) 167/96 (08/06 0507) SpO2:  [93 %-100 %] 100 % (08/06 0500) Last BM Date: 08/19/18 General:   Alert,  Well-developed, well-nourished, pleasant and cooperative in NAD Head:  Normocephalic and atraumatic. Eyes:  Sclera clear, no icterus.  Abdomen:  Soft, nondistended. Mild upper abdominal tenderness. Began having hiccups and heaving during exam. Normal bowel sounds, without guarding, and without rebound.   Extremities:  Without clubbing, deformity or edema. Neurologic:  Alert and  oriented x4;  grossly normal neurologically. Skin:  Intact without significant lesions or rashes. Psych:  Alert and cooperative. Normal mood and affect.  Intake/Output from previous day: 08/05 0701 - 08/06 0700 In: 3685.3 [P.O.:240; I.V.:3445.3] Out: 775 [Urine:775] Intake/Output this shift: No intake/output data recorded.  Lab Results: CBC Recent Labs    08/20/18 1924 08/21/18 0126 08/21/18 0729 08/22/18 0612  WBC 13.2*  --  13.0* 13.6*  HGB 16.5 15.0 14.7 16.0  HCT 51.0 46.7 46.4 51.2  MCV 90.3  --  90.3 91.6  PLT 281  --  251 249   BMET Recent Labs    08/20/18 1924 08/22/18 0612  NA 134* 138  K 4.0 4.7  CL 100 102  CO2 19* 24  GLUCOSE 209* 133*  BUN 11 11  CREATININE 0.99 0.85  CALCIUM 9.5 9.4   LFTs Recent Labs    08/20/18 1924  BILITOT 0.7  ALKPHOS 71  AST 22  ALT 16  PROT 8.4*  ALBUMIN 4.5   Recent Labs    08/20/18 1924  LIPASE 27   PT/INR Recent Labs    08/20/18 1924  LABPROT 13.3  INR 1.0      Imaging Studies: Dg Chest 1 View  Result Date: 08/20/2018 CLINICAL DATA:  Severe  abdominal pain EXAM: CHEST  1 VIEW COMPARISON:  11/10/2017 FINDINGS: Cardiac shadow is within normal limits. Postsurgical changes are again seen. Mild interstitial changes are noted bilaterally without focal infiltrate or sizable effusion. No bony abnormality is noted. IMPRESSION: No acute abnormality seen. Electronically Signed   By: Alcide CleverMark  Lukens M.D.   On: 08/20/2018 19:42   Ct Angio Abd/pel W And/or Wo Contrast  Result Date: 08/20/2018 CLINICAL DATA:  Abdominal pain and hematemesis for several hours, history of known abdominal aneurysm. EXAM: CTA ABDOMEN AND PELVIS WITH CONTRAST TECHNIQUE: Multidetector CT imaging of the abdomen and pelvis was performed using the standard protocol during bolus administration of intravenous contrast. Multiplanar reconstructed images and MIPs were obtained and reviewed to evaluate the vascular anatomy. CONTRAST:  100mL OMNIPAQUE IOHEXOL 350 MG/ML SOLN COMPARISON:  06/26/2018 FINDINGS: VASCULAR Aorta: Abdominal aorta demonstrates evidence of the known infrarenal aortic aneurysm. It measures approximately 5.4 cm in greatest dimension using similar measurements as that on the prior exam. Considerable mural thrombus is noted. Mild irregularity of the mural thrombus is noted best seen on image number 82 of series 4. This may be related to some swirling of the contrast within the lumen. No findings to suggest impending rupture are identified at this time. Tapering at the level of the aortic bifurcation is noted similar to that seen on the prior exam. Celiac:  Mild atherosclerotic calcifications are noted with 50% stenosis at the origin. The more distal branches are within normal limits. SMA: Patent without evidence of aneurysm, dissection, vasculitis or significant stenosis. Renals: Both renal arteries are patent without evidence of aneurysm, dissection, vasculitis, fibromuscular dysplasia or significant stenosis. IMA: The IMA is patent just beyond its origin related to  collateralization. The orifice of the IMA is occluded secondary to the mural thrombus anteriorly. Iliacs and outflow: Atherosclerotic changes are noted within the iliac arteries bilaterally without aneurysmal dilatation. The superficial femoral artery on the left is occluded just beyond its origin. The left internal iliac artery is again occluded. Veins: No venous abnormality is noted. Review of the MIP images confirms the above findings. NON-VASCULAR Lower chest: Mild scarring is noted in the bases bilaterally. No focal infiltrate is seen. Hepatobiliary: No focal liver abnormality is seen. No gallstones, gallbladder wall thickening, or biliary dilatation. Pancreas: Unremarkable. No pancreatic ductal dilatation or surrounding inflammatory changes. Spleen: Normal in size without focal abnormality. Adrenals/Urinary Tract: Stable right adrenal nodule is again seen. Left adrenal gland is unremarkable. Kidneys demonstrate no obstructive changes. The bladder is well distended. Stomach/Bowel: The appendix is within normal limits. Colon is decompressed. No obstructive or inflammatory changes of the small bowel are seen. The stomach is within normal limits with the exception of a sliding-type hiatal hernia. Lymphatic: No significant lymphadenopathy is identified. Reproductive: Prostate is unremarkable. Other: No abdominal wall hernia or abnormality. No abdominopelvic ascites. Musculoskeletal: Atrophic changes are noted in the musculature of the left thigh consistent with the patient's known history of prior left AKA. Degenerative changes of lumbar spine are seen. IMPRESSION: VASCULAR Stable size of the abdominal aortic aneurysm. Slight variation in the measurement is noted although likely related to technical variation in the imaging. 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 NON-VASCULAR Chronic changes as described above.  No acute abnormality noted. Electronically Signed   By: Inez Catalina M.D.   On: 08/20/2018 19:41  [2 weeks]   Assessment: 68 y/o male presenting with acute onset of epigastric abdominal pain and associated vomiting, with coffee-ground emesis after multiple episodes. Aleve several times per week and daily ASA. No PPI. Due for endovascular aneurysm repair in near future for AAA. CTA this admission as previously outlined. Gallbladder unremarkable. Continues to have vomiting this morning.   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 Small hiatal hernia.   Plan: 1. Protonix 40mg  BID for one month then daily thereafter.  2. Carafate suspension 1 gram before meals and at bedtime for five days.  3. Continue supportive measures.  4. Continue clear liquids for now.   Laureen Ochs. Bernarda Caffey [facility] [phone] 8/6/20209:11 AM  Attending note: As above: Will make Zofran 4 mg IV every 6 hours x4 doses scheduled.   LOS: 0 days

## 2018-08-23 NOTE — Progress Notes (Signed)
PROGRESS NOTE    [name]  ZOX:[id number] DOB: 10-08-1950 DOA: 08/20/2018 PCP: Carylon PerchesFagan, Roy, MD     Brief Narrative:  68 y.o.malewith history of CAD, diabetes, known AAA last measured at 5.6 cm with scheduled repair later this month presents to the ED with severe, sudden abdominal pain that began around 2 PM. He has had associated hematemesis x4.  He has had a total of emesis x8. He reports he woke up feeling fine. He has not eaten anything today.  Last meal was fried fish cooked at home that he caught himself.Someone who knows him found him lying on his front porch and EMS was called. Patient denies any chest pain or shortness of breath, fever, urinary symptoms, diarrhea, bloody stools. Patient denies drinking alcohol regularly. He states he only drinks occasionally.  Patient does report that he smokes marijuana, but does not associate his abdominal pain with that.  Patient reports that he has had this happen to him before about 3 months ago.  That visit to the ED was reviewed and at that time he was not complaining of hematemesis but just diarrhea and emesis.  Patient describes his emesis as spit with color and it.  He reports that initially it was white frothy mouthful, and then progressed to white frothy with more more red in it.  Each episode of emesis was approximately "1 mouthful."  He has not had any lightheadedness, paresthesias, shortness of breath, or other symptoms of anemia.  Since he has been to the ED he has not had any episode of hematemesis or emesis.  Patient describes abdominal pain as sharp, and in the center of his stomach.  It waxes and wanes unpredictably.  Episodes of emesis did not improve or worsen the pain.  Pain improved with treatment in the ED.  Assessment & Plan: 1-hematemesis -Secondary to upper GI bleed -Follow hemoglobin trend -No transfusion needed -GI service on board and will follow rec's.  Patient's endoscopy demonstrated minimally wisteria,  esophagitis and gastritis. -Advised to stop the use of NSAIDs -We will continue PPI and Carafate. -Patient still actively vomiting but no further blood seen in the gastric content -Continue as needed antiemetics and analgesics.  2-hyponatremia/dehydration  -continue IVF's -Follow electrolytes trend intermittently.  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 -Hold oral hypoglycemic agents while inpatient.  4-hyperlipidemia -Continue Zocor  5-BPH -Continue Flomax  6-hypertension -Continue the use of metoprolol and lisinopril. -Vital signs stable -Blood pressure stable and well-controlled; will follow vital signs.    DVT prophylaxis: SCDs Code Status: Full code Family Communication: No family at bedside. Disposition Plan: Continue PPI, continue Carafate as recommended by GI; advance diet as tolerated continue as needed antiemetics.  Consultants:   GI service  Procedures:   EGD: 08/22/18: Demonstrating Mallory-Weiss, esophagitis and gastritis.  Antimicrobials:  Anti-infectives (From admission, onward)   None      Subjective: Still actively vomiting and experiencing intermittent episode of nausea.  No chest pain, no shortness of breath, no further hematemesis.  Objective: Vitals:   08/22/18 1515 08/22/18 2116 08/23/18 0500 08/23/18 0507  BP:  (!) 141/92 (!) 150/100 (!) 167/96  Pulse: (!) 59 80 62 62  Resp: 14 20 20    Temp:  99 F (37.2 C) 98.9 F (37.2 C)   TempSrc:  Oral Oral   SpO2: 97% 98% 100%   Weight:      Height:  Intake/Output Summary (Last 24 hours) at 08/23/2018 1318 Last data filed at 08/23/2018 0852 Gross per 24 hour  Intake 3925.28 ml  Output 725 ml  Net 3200.28 ml   Filed Weights   08/20/18 1828 08/21/18 0022 08/21/18 0541  Weight: 72.6 kg 64.4 kg 64.4 kg    Examination: General exam: Alert, awake, oriented x 3; still  reporting midepigastric discomfort (even improved), intermittent episode of nausea and have active vomiting around breakfast time.  No further hematemesis. Respiratory system: Clear to auscultation. Respiratory effort normal. Cardiovascular system:RRR. No murmurs, rubs, gallops. Gastrointestinal system: Abdomen is nondistended, soft and nontender. No organomegaly or masses felt. Normal bowel sounds heard. Central nervous system: Alert and oriented. No focal neurological deficits. Extremities: No C/C/E, +pedal pulses Skin: No rashes, lesions or ulcers Psychiatry: Judgement and insight appear normal. Mood & affect appropriate.    Data Reviewed: I have personally reviewed following labs and imaging studies  CBC: Recent Labs  Lab 08/20/18 1924 08/21/18 0126 08/21/18 0729 08/22/18 0612  WBC 13.2*  --  13.0* 13.6*  NEUTROABS 12.0*  --   --   --   HGB 16.5 15.0 14.7 16.0  HCT 51.0 46.7 46.4 51.2  MCV 90.3  --  90.3 91.6  PLT 281  --  251 161   Basic Metabolic Panel: Recent Labs  Lab 08/20/18 1924 08/21/18 0729 08/22/18 0612  NA 134*  --  138  K 4.0  --  4.7  CL 100  --  102  CO2 19*  --  24  GLUCOSE 209*  --  133*  BUN 11  --  11  CREATININE 0.99  --  0.85  CALCIUM 9.5  --  9.4  MG  --  1.9  --   PHOS  --  4.0  --    GFR: Estimated Creatinine Clearance: 76.8 mL/min (by C-G formula based on SCr of 0.85 mg/dL).   Liver Function Tests: Recent Labs  Lab 08/20/18 1924  AST 22  ALT 16  ALKPHOS 71  BILITOT 0.7  PROT 8.4*  ALBUMIN 4.5   Recent Labs  Lab 08/20/18 1924  LIPASE 27   Coagulation Profile: Recent Labs  Lab 08/20/18 1924  INR 1.0   HbA1C: Recent Labs    08/21/18 0126  HGBA1C 6.8*   CBG: Recent Labs  Lab 08/22/18 1325  GLUCAP 130*   Urine analysis:    Component Value Date/Time   COLORURINE AMBER (A) 02/01/2018 1108   APPEARANCEUR CLOUDY (A) 02/01/2018 1108   LABSPEC 1.017 02/01/2018 1108   PHURINE 6.0 02/01/2018 1108   GLUCOSEU 50 (A)  02/01/2018 1108   GLUCOSEU neg 02/03/2009 1250   HGBUR LARGE (A) 02/01/2018 1108   BILIRUBINUR NEGATIVE 02/01/2018 1108   [place] 02/01/2018 1108   PROTEINUR 100 (A) 02/01/2018 1108   NITRITE NEGATIVE 02/01/2018 1108   LEUKOCYTESUR MODERATE (A) 02/01/2018 1108    Recent Results (from the past 240 hour(s))  SARS Coronavirus 2 [facility] order, Performed in [place] hospital lab)     Status: None   Collection Time: 08/20/18  7:45 PM  Result Value Ref Range Status   SARS Coronavirus 2 NEGATIVE NEGATIVE Final    Comment: (NOTE) If result is NEGATIVE SARS-CoV-2 target nucleic acids are NOT DETECTED. The SARS-CoV-2 RNA is generally detectable in upper and lower  respiratory specimens during the acute phase of infection. The lowest  concentration of SARS-CoV-2 viral copies this assay can detect is 250  copies / mL. A negative result  does not preclude SARS-CoV-2 infection  and should not be used as the sole basis for treatment or other  patient management decisions.  A negative result may occur with  improper specimen collection / handling, submission of specimen other  than nasopharyngeal swab, presence of viral mutation(s) within the  areas targeted by this assay, and inadequate number of viral copies  (<250 copies / mL). A negative result must be combined with clinical  observations, patient history, and epidemiological information. If result is POSITIVE SARS-CoV-2 target nucleic acids are DETECTED. The SARS-CoV-2 RNA is generally detectable in upper and lower  respiratory specimens dur ing the acute phase of infection.  Positive  results are indicative of active infection with SARS-CoV-2.  Clinical  correlation with patient history and other diagnostic information is  necessary to determine patient infection status.  Positive results do  not rule out bacterial infection or co-infection with other viruses. If result is PRESUMPTIVE POSTIVE SARS-CoV-2 nucleic acids MAY BE  PRESENT.   A presumptive positive result was obtained on the submitted specimen  and confirmed on repeat testing.  While 2019 novel coronavirus  (SARS-CoV-2) nucleic acids may be present in the submitted sample  additional confirmatory testing may be necessary for epidemiological  and / or clinical management purposes  to differentiate between  SARS-CoV-2 and other Sarbecovirus currently known to infect humans.  If clinically indicated additional testing with an alternate test  methodology [phone] is advised. The SARS-CoV-2 RNA is generally  detectable in upper and lower respiratory sp ecimens during the acute  phase of infection. The expected result is Negative. Fact Sheet for Patients:  BoilerBrush.com.cyhttps://www.fda.gov/media/136312/download Fact Sheet for Healthcare Providers: https://pope.com/https://www.fda.gov/media/136313/download This test is not yet approved or cleared by the [place] FDA and has been authorized for detection and/or diagnosis of SARS-CoV-2 by FDA under an Emergency Use Authorization (EUA).  This EUA will remain in effect (meaning this test can be used) for the duration of the COVID-19 declaration under Section 564(b)(1) of the Act, 21 U.S.C. section 360bbb-3(b)(1), unless the authorization is terminated or revoked sooner. Performed at [facility], [address] [place], [place] [address]      Radiology Studies: No results found.   Scheduled Meds: . lisinopril  5 mg Oral Daily  . metoCLOPramide (REGLAN) injection  10 mg Intravenous Once  . metoCLOPramide (REGLAN) injection  10 mg Intravenous Once  . metoprolol tartrate  12.5 mg Oral BID  . ondansetron (ZOFRAN) IV  4 mg Intravenous Q6H  . pantoprazole  40 mg Oral BID AC  . simvastatin  40 mg Oral QHS  . sucralfate  1 g Oral TID AC & HS  . tamsulosin  0.4 mg Oral Daily   Continuous Infusions: . sodium chloride 75 mL/hr at 08/23/18 0138     LOS: 0 days     Time spent: 30 minutes.   Vassie Lollarlos Lakeisa Heninger, MD [facility]  Pager [phone]    08/23/2018, 1:18 PM

## 2018-08-23 NOTE — Plan of Care (Signed)
  Problem: Education: Goal: Knowledge of General Education information will improve Description Including pain rating scale, medication(s)/side effects and non-pharmacologic comfort measures Outcome: Progressing   Problem: Health Behavior/Discharge Planning: Goal: Ability to manage health-related needs will improve Outcome: Progressing   

## 2018-08-24 DIAGNOSIS — I10 Essential (primary) hypertension: Secondary | ICD-10-CM

## 2018-08-24 DIAGNOSIS — K922 Gastrointestinal hemorrhage, unspecified: Secondary | ICD-10-CM

## 2018-08-24 DIAGNOSIS — R109 Unspecified abdominal pain: Secondary | ICD-10-CM

## 2018-08-24 LAB — CBC
HCT: 51.1 % (ref 39.0–52.0)
Hemoglobin: 16.8 g/dL (ref 13.0–17.0)
MCH: 28.9 pg (ref 26.0–34.0)
MCHC: 32.9 g/dL (ref 30.0–36.0)
MCV: 88 fL (ref 80.0–100.0)
Platelets: 220 10*3/uL (ref 150–400)
RBC: 5.81 MIL/uL (ref 4.22–5.81)
RDW: 13.4 % (ref 11.5–15.5)
WBC: 11.8 10*3/uL — ABNORMAL HIGH (ref 4.0–10.5)
nRBC: 0 % (ref 0.0–0.2)

## 2018-08-24 LAB — BASIC METABOLIC PANEL
Anion gap: 9 (ref 5–15)
BUN: 15 mg/dL (ref 8–23)
CO2: 23 mmol/L (ref 22–32)
Calcium: 8.5 mg/dL — ABNORMAL LOW (ref 8.9–10.3)
Chloride: 103 mmol/L (ref 98–111)
Creatinine, Ser: 0.9 mg/dL (ref 0.61–1.24)
GFR calc Af Amer: >60 mL/min (ref >60)
GFR calc non Af Amer: >60 mL/min (ref >60)
Glucose, Bld: 117 mg/dL — ABNORMAL HIGH (ref 70–99)
Potassium: 3.7 mmol/L (ref 3.5–5.1)
Sodium: 135 mmol/L (ref 135–145)

## 2018-08-24 LAB — GLUCOSE, CAPILLARY
Glucose-Capillary: 132 mg/dL — ABNORMAL HIGH (ref 70–99)
Glucose-Capillary: 100 mg/dL — ABNORMAL HIGH (ref 70–99)

## 2018-08-24 MED ORDER — ONDANSETRON HCL 8 MG PO TABS: 8.0000 mg | ORAL_TABLET | ORAL | 0 refills | Status: DC | PRN

## 2018-08-24 MED ORDER — PANTOPRAZOLE SODIUM 40 MG PO TBEC: DELAYED_RELEASE_TABLET | ORAL | 1 refills | Status: DC

## 2018-08-24 MED ORDER — SUCRALFATE 1 GM/10ML PO SUSP: 1.0000 g | Freq: Three times a day (TID) | ORAL | 0 refills | Status: DC

## 2018-08-24 NOTE — Care Management Important Message (Signed)
Important Message  Patient Details  Name: [name] MRN: [id number] Date of Birth: [birth year]   Medicare Important Message Given:  Yes     Tommy Medal 08/24/2018, 1:06 PM

## 2018-08-24 NOTE — Discharge Summary (Signed)
Physician Discharge Summary  [name]:[id number] DOB: 02-06-1950 DOA: 08/20/2018  PCP: Asencion Noble, MD  Admit date: 08/20/2018 Discharge date: 08/24/2018  Time spent: 35 minutes  Recommendations for Outpatient Follow-up:  1. Repeat basic metabolic panel to follow electrolytes and renal function 2. Repeat CBC to follow hemoglobin trend  Discharge Diagnoses:  Active Problems:   Hematemesis with nausea   Epigastric pain   Sudden onset of severe abdominal pain   Upper gastrointestinal bleed hx of AAA  Discharge Condition: Stable and improved.  Patient discharged home with instruction to follow-up with PCP in 10 days.  Diet recommendation: Heart healthy modified carbohydrate diet  Filed Weights   08/20/18 1828 08/21/18 0022 08/21/18 0541  Weight: 72.6 kg 64.4 kg 64.4 kg    History of present illness:  68 y.o.malewith history of CAD, diabetes, known AAA last measured at 5.6 cm with scheduled repair later this monthpresents to the ED withsevere, sudden abdominal pain that began around 2 PM. He has had associated hematemesisx4.He has had a total of emesis x8.He reports he woke up feeling fine. He has not eaten anything today.Last meal was fried fish cooked at home that he caught himself.Someone who knows him found him lying on his front porch and EMS was called. Patient denies any chest pain or shortness of breath, fever, urinary symptoms, diarrhea, bloody stools. Patient denies drinking alcohol regularly. He states he only drinks occasionally.Patient does report that he smokes marijuana, but does not associate his abdominal pain with that. Patient reports that he has had this happen to him before about 3 months ago. That visit to the ED was reviewed and at that time he was not complaining of hematemesis but just diarrhea and emesis. Patient describes his emesis as spit with color and it. He reports that initially it was white frothy mouthful, and then progressed to  white frothy with more more red in it. Each episode of emesis was approximately "1 mouthful." He has not had any lightheadedness,paresthesias, shortness of breath, or other symptoms of anemia. Since he has been to the ED he has not had any episode of hematemesis or emesis.  Patient describes abdominal pain as sharp, and in the center of his stomach. It waxes and wanes unpredictably. Episodes of emesis did not improve or worsen the pain. Pain improved with treatment in the ED.  Hospital Course:  1-hematemesis -Secondary to upper GI bleed -No transfusion needed -Appreciate GI service advises and recommendations.  Patient status post  endoscopy demonstrating Mallory Weiss tear, esophagitis and gastritis. -Advised to stop the use of NSAIDs -Will continue PPI BID for 3 months and Carafate for 5 days. -As needed antiemetics has been prescribed. -Tolerating diet at time of discharge.  2-hyponatremia/dehydration  -Resolved with fluid resuscitation -Appears to be secondary to GI losses from vomiting and poor oral intake. -Recommending repeat basic metabolic panel follow-up visit to reassess electrolytes trend.  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  4-hyperlipidemia -Continue Zocor  5-BPH -Continue Flomax  6-hypertension -Continue the use of metoprolol and lisinopril. -Vital signs stable overall  -Advised to follow heart healthy diet.  7-hx of AAA -continue outpatient follow up with vascular surgery    Procedures:  See below for x-ray reports  Endoscopy: 08/22/2018; demonstrating Mallory-Weiss tear, esophagitis and gastritis.  Consultations:  Gastroenterology service  Discharge Exam: Vitals:   08/24/18 0545 08/24/18 0555  BP: (!) 125/105 (!) 133/100  Pulse: 97 85  Resp: 18   Temp: 98.3 F (36.8 C)   SpO2: 100%    General exam: Alert, awake,  oriented x 3;  denies any further nausea or vomiting.  No hematemesis.  Tolerating diet at time of discharge. Respiratory system: Clear to auscultation. Respiratory effort normal. Cardiovascular system:RRR. No murmurs, rubs, gallops. Gastrointestinal system: Abdomen is nondistended, soft and nontender. No organomegaly or masses felt. Normal bowel sounds heard. Central nervous system: Alert and oriented. No focal neurological deficits. Extremities: No C/C/E, + left AKA. Skin: No rashes, lesions or ulcers Psychiatry: Judgement and insight appear normal. Mood & affect appropriate.   Discharge Instructions   Discharge Instructions    Diet - low sodium heart healthy   Complete by: As directed    Discharge instructions   Complete by: As directed    Take medications as prescribed Avoid the use of NSAIDs Keep yourself well-hydrated Arrange follow-up with PCP in 10 days Follow-up with gastroenterology service as instructed.     Allergies as of 08/24/2018   No Known Allergies     Medication List    TAKE these medications   aspirin EC 81 MG tablet Take 81 mg by mouth daily.   HYDROcodone-acetaminophen 5-325 MG tablet Commonly known as: NORCO/VICODIN One tablet by mouth every six hours as needed for pain.   lisinopril 5 MG tablet Commonly known as: ZESTRIL TAKE 1 TABLET(5 MG) BY MOUTH DAILY   metoprolol tartrate 25 MG tablet Commonly known as: LOPRESSOR Take 0.5 tablets (12.5 mg total) by mouth 2 (two) times daily.   ondansetron 8 MG tablet Commonly known as: Zofran Take 1 tablet (8 mg total) by mouth every 4 (four) hours as needed for nausea or vomiting. What changed: reasons to take this   pantoprazole 40 MG tablet Commonly known as: PROTONIX Take 1 tablet by mouth twice a day for 90 days; then use 1 tablet by mouth daily.   simvastatin 40 MG tablet Commonly known as: ZOCOR Take 40 mg by mouth at bedtime.   sucralfate 1 GM/10ML suspension Commonly known as:  CARAFATE Take 10 mLs (1 g total) by mouth 4 (four) times daily -  before meals and at bedtime for 5 days.   tamsulosin 0.4 MG Caps capsule Commonly known as: FLOMAX Take 0.4 mg by mouth daily.   Tradjenta 5 MG Tabs tablet Generic drug: linagliptin Take 5 mg by mouth daily.   [name] FlexTouch 100 UNIT/ML Sopn FlexTouch Pen Generic drug: insulin degludec Inject 20 Units into the skin every morning.      No Known Allergies   The results of significant diagnostics from this hospitalization (including imaging, microbiology, ancillary and laboratory) are listed below for reference.    Significant Diagnostic Studies: Dg Chest 1 View  Result Date: 08/20/2018 CLINICAL DATA:  Severe abdominal pain EXAM: CHEST  1 VIEW COMPARISON:  11/10/2017 FINDINGS: Cardiac shadow is within normal limits. Postsurgical changes are again seen. Mild interstitial changes are noted bilaterally without focal infiltrate or sizable effusion. No bony abnormality is noted. IMPRESSION: No acute abnormality seen. Electronically Signed   By: Alcide Clever M.D.   On: 08/20/2018 19:42   Ct Angio Abd/pel W And/or Wo Contrast  Result Date: 08/20/2018 CLINICAL DATA:  Abdominal pain and hematemesis for several hours, history of known abdominal aneurysm. EXAM: CTA ABDOMEN AND PELVIS WITH CONTRAST TECHNIQUE: Multidetector CT imaging of the abdomen and pelvis was performed using the standard protocol during bolus administration of intravenous contrast. Multiplanar reconstructed images and MIPs were obtained and  reviewed to evaluate the vascular anatomy. CONTRAST:  100mL OMNIPAQUE IOHEXOL 350 MG/ML SOLN COMPARISON:  06/26/2018 FINDINGS: VASCULAR Aorta: Abdominal aorta demonstrates evidence of the known infrarenal aortic aneurysm. It measures approximately 5.4 cm in greatest dimension using similar measurements as that on the prior exam. Considerable mural thrombus is noted. Mild irregularity of the mural thrombus is noted best seen on  image number 82 of series 4. This may be related to some swirling of the contrast within the lumen. No findings to suggest impending rupture are identified at this time. Tapering at the level of the aortic bifurcation is noted similar to that seen on the prior exam. Celiac: Mild atherosclerotic calcifications are noted with 50% stenosis at the origin. The more distal branches are within normal limits. SMA: Patent without evidence of aneurysm, dissection, vasculitis or significant stenosis. Renals: Both renal arteries are patent without evidence of aneurysm, dissection, vasculitis, fibromuscular dysplasia or significant stenosis. IMA: The IMA is patent just beyond its origin related to collateralization. The orifice of the IMA is occluded secondary to the mural thrombus anteriorly. Iliacs and outflow: Atherosclerotic changes are noted within the iliac arteries bilaterally without aneurysmal dilatation. The superficial femoral artery on the left is occluded just beyond its origin. The left internal iliac artery is again occluded. Veins: No venous abnormality is noted. Review of the MIP images confirms the above findings. NON-VASCULAR Lower chest: Mild scarring is noted in the bases bilaterally. No focal infiltrate is seen. Hepatobiliary: No focal liver abnormality is seen. No gallstones, gallbladder wall thickening, or biliary dilatation. Pancreas: Unremarkable. No pancreatic ductal dilatation or surrounding inflammatory changes. Spleen: Normal in size without focal abnormality. Adrenals/Urinary Tract: Stable right adrenal nodule is again seen. Left adrenal gland is unremarkable. Kidneys demonstrate no obstructive changes. The bladder is well distended. Stomach/Bowel: The appendix is within normal limits. Colon is decompressed. No obstructive or inflammatory changes of the small bowel are seen. The stomach is within normal limits with the exception of a sliding-type hiatal hernia. Lymphatic: No significant  lymphadenopathy is identified. Reproductive: Prostate is unremarkable. Other: No abdominal wall hernia or abnormality. No abdominopelvic ascites. Musculoskeletal: Atrophic changes are noted in the musculature of the left thigh consistent with the patient's known history of prior left AKA. Degenerative changes of lumbar spine are seen. IMPRESSION: VASCULAR Stable size of the abdominal aortic aneurysm. Slight variation in the measurement is noted although likely related to technical variation in the imaging. 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 NON-VASCULAR Chronic changes as described above.  No acute abnormality noted. Electronically Signed   By: Alcide CleverMark  Lukens M.D.   On: 08/20/2018 19:41    Microbiology: Recent Results (from the past 240 hour(s))  SARS Coronavirus 2 [facility](Hospital order, Performed [facility] lab)     Status: None   Collection Time: 08/20/18  7:45 PM  Result Value Ref Range Status   SARS Coronavirus 2 NEGATIVE NEGATIVE Final    Comment: (NOTE) If result is NEGATIVE SARS-CoV-2 target nucleic acids are NOT DETECTED. The SARS-CoV-2 RNA is generally detectable in upper and lower  respiratory specimens during the acute phase of infection. The lowest  concentration of SARS-CoV-2 viral copies this assay can detect is 250  copies / mL. A negative result does not preclude SARS-CoV-2 infection  and should not be used as  the sole basis for treatment or other  patient management decisions.  A negative result may occur with  improper specimen collection / handling, submission of specimen other  than nasopharyngeal swab, presence of viral mutation(s) within the  areas targeted by this assay, and inadequate number of viral copies  (<250 copies / mL). A negative result must be  combined with clinical  observations, patient history, and epidemiological information. If result is POSITIVE SARS-CoV-2 target nucleic acids are DETECTED. The SARS-CoV-2 RNA is generally detectable in upper and lower  respiratory specimens dur ing the acute phase of infection.  Positive  results are indicative of active infection with SARS-CoV-2.  Clinical  correlation with patient history and other diagnostic information is  necessary to determine patient infection status.  Positive results do  not rule out bacterial infection or co-infection with other viruses. If result is PRESUMPTIVE POSTIVE SARS-CoV-2 nucleic acids MAY BE PRESENT.   A presumptive positive result was obtained on the submitted specimen  and confirmed on repeat testing.  While 2019 novel coronavirus  (SARS-CoV-2) nucleic acids may be present in the submitted sample  additional confirmatory testing may be necessary for epidemiological  and / or clinical management purposes  to differentiate between  SARS-CoV-2 and other Sarbecovirus currently known to infect humans.  If clinically indicated additional testing with an alternate test  methodology [phone]([id number]) is advised. The SARS-CoV-2 RNA is generally  detectable in upper and lower respiratory sp ecimens during the acute  phase of infection. The expected result is Negative. Fact Sheet for Patients:  BoilerBrush.com.cyhttps://www.fda.gov/media/136312/download Fact Sheet for Healthcare Providers: https://pope.com/https://www.fda.gov/media/136313/download This test is not yet approved or cleared by the [place] FDA and has been authorized for detection and/or diagnosis of SARS-CoV-2 by FDA under an Emergency Use Authorization (EUA).  This EUA will remain in effect (meaning this test can be used) for the duration of the COVID-19 declaration under Section 564(b)(1) of the Act, 21 U.S.C. section 360bbb-3(b)(1), unless the authorization is terminated or revoked sooner. Performed at [facility],  [address] [place], [place] [id number]      Labs: Basic Metabolic Panel: Recent Labs  Lab 08/20/18 1924 08/21/18 0729 08/22/18 0612 08/24/18 0635  NA 134*  --  138 135  K 4.0  --  4.7 3.7  CL 100  --  102 103  CO2 19*  --  24 23  GLUCOSE 209*  --  133* 117*  BUN 11  --  11 15  CREATININE 0.99  --  0.85 0.90  CALCIUM 9.5  --  9.4 8.5*  MG  --  1.9  --   --   PHOS  --  4.0  --   --    Liver Function Tests: Recent Labs  Lab 08/20/18 1924  AST 22  ALT 16  ALKPHOS 71  BILITOT 0.7  PROT 8.4*  ALBUMIN 4.5   Recent Labs  Lab 08/20/18 1924  LIPASE 27   CBC: Recent Labs  Lab 08/20/18 1924 08/21/18 0126 08/21/18 0729 08/22/18 0612 08/24/18 0635  WBC 13.2*  --  13.0* 13.6* 11.8*  NEUTROABS 12.0*  --   --   --   --   HGB 16.5 15.0 14.7 16.0 16.8  HCT 51.0 46.7 46.4 51.2 51.1  MCV 90.3  --  90.3 91.6 88.0  PLT 281  --  251 249 220   CBG: Recent Labs  Lab 08/22/18 1325 08/23/18 1604 08/23/18 2135 08/24/18 0742 08/24/18 1200  GLUCAP 130* 143* 151* 132*  100*    Signed:  Vassie Lollarlos Rei Medlen MD.  [facility] 08/24/2018, 1:41 PM

## 2018-08-24 NOTE — Plan of Care (Signed)

## 2018-08-24 NOTE — Progress Notes (Signed)
    Subjective: Doing "100% better."  States last episode of emesis was yesterday morning.  None in at least 24 hours.  Emesis yesterday without blood.  Has not had a bowel movement in 24 hours.  States he is ready to go home.  Describes his AAA surgery upcoming.  Denies abdominal pain or persistent GERD symptoms.  No other GI complaints.  Objective: Vital signs in last 24 hours: Temp:  [98.2 F (36.8 C)-98.6 F (37 C)] 98.3 F (36.8 C) (08/07 0545) Pulse Rate:  [63-98] 85 (08/07 0555) Resp:  [18] 18 (08/07 0545) BP: (125-158)/(89-112) 133/100 (08/07 0555) SpO2:  [94 %-100 %] 100 % (08/07 0545) Last BM Date: 08/19/18 General:   Alert and oriented, pleasant Head:  Normocephalic and atraumatic. Eyes:  No icterus, sclera clear. Conjuctiva pink.  Neck:  Supple, without thyromegaly or masses.  Heart:  S1, S2 present, no murmurs noted.  Lungs: Clear to auscultation bilaterally, without wheezing, rales, or rhonchi.  Abdomen:  Bowel sounds present, soft, non-tender, non-distended. No HSM or hernias noted. No rebound or guarding. No masses appreciated  Msk:  Noted left AKA Extremities:  Without clubbing or edema. Neurologic:  Alert and  oriented x4;  grossly normal neurologically. Psych:  Alert and cooperative. Normal mood and affect.  Intake/Output from previous day: 08/06 0701 - 08/07 0700 In: 720 [P.O.:720] Out: 2250 [Urine:2250] Intake/Output this shift: No intake/output data recorded.  Lab Results: Recent Labs    08/22/18 0612 08/24/18 0635  WBC 13.6* 11.8*  HGB 16.0 16.8  HCT 51.2 51.1  PLT 249 220   BMET Recent Labs    08/22/18 0612 08/24/18 0635  NA 138 135  K 4.7 3.7  CL 102 103  CO2 24 23  GLUCOSE 133* 117*  BUN 11 15  CREATININE 0.85 0.90  CALCIUM 9.4 8.5*   LFT No results for input(s): PROT, ALBUMIN, AST, ALT, ALKPHOS, BILITOT, BILIDIR, IBILI in the last 72 hours. PT/INR No results for input(s): LABPROT, INR in the last 72 hours. Hepatitis Panel No  results for input(s): HEPBSAG, HCVAB, HEPAIGM, HEPBIGM in the last 72 hours.   Studies/Results: No results found.  Assessment: 68 y/o male presenting with acute onset of epigastric abdominal pain and associated vomiting, with coffee-ground emesis after multiple episodes. Aleve several times per week and daily ASA. No PPI. Due for endovascular aneurysm repair in near future for AAA. CTA this admission as previously outlined. Gallbladder unremarkable. Continued to have vomiting yesterday and was started on scheduled Zofran.   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 Small hiatal hernia.   Today hgb remains stable/normal at 16.8. Leucocytosis improved. Electrolytes (specifically potassium) essentially normal.  Today he states he is feeling 100% better.  He is ready to go home.  No further emesis.  No bowel movement in 24 hours.  Essentially no complaints this morning.  Plan: 1. Continued PPI and Carafate 2. Regular diet at lunch 3. If he tolerated regular lunch can likely discharge from a GI perspective 4. Will need PPI bid x 3 months minimum and outpatient GI follow-up 5. Supportive measures    Thank you for allowing [place] to participate in the care of Louise, DNP, AGNP-C Adult & Gerontological Nurse Practitioner [facility]     LOS: 1 day    08/24/2018, 8:02 AM

## 2018-08-25 ENCOUNTER — Other Ambulatory Visit: Payer: Self-pay | Admitting: Internal Medicine

## 2018-08-25 MED ORDER — ONDANSETRON HCL 8 MG PO TABS: 8.0000 mg | ORAL_TABLET | ORAL | 0 refills | Status: DC | PRN

## 2018-08-25 MED ORDER — SUCRALFATE 1 GM/10ML PO SUSP: 1.0000 g | Freq: Three times a day (TID) | ORAL | 0 refills | Status: DC

## 2018-08-25 MED ORDER — PANTOPRAZOLE SODIUM 40 MG PO TBEC: DELAYED_RELEASE_TABLET | ORAL | 1 refills | Status: DC

## 2018-08-25 NOTE — Progress Notes (Unsigned)
Patient lost discharge prescriptions and I have reordered them and sent electronically to his Pharmacy.  Barton Dubois MD [phone]

## 2018-08-28 ENCOUNTER — Encounter (HOSPITAL_COMMUNITY): Payer: Self-pay | Admitting: Internal Medicine

## 2018-08-30 ENCOUNTER — Other Ambulatory Visit: Payer: Self-pay

## 2018-08-30 ENCOUNTER — Encounter: Payer: Self-pay | Admitting: Physician Assistant

## 2018-08-30 ENCOUNTER — Ambulatory Visit (INDEPENDENT_AMBULATORY_CARE_PROVIDER_SITE_OTHER): Payer: Medicare Other | Admitting: Physician Assistant

## 2018-08-30 VITALS — BP 103/69 | HR 87 | Ht 70.0 in | Wt 156.4 lb

## 2018-08-30 DIAGNOSIS — Z01810 Encounter for preprocedural cardiovascular examination: Secondary | ICD-10-CM

## 2018-08-30 DIAGNOSIS — Z952 Presence of prosthetic heart valve: Secondary | ICD-10-CM | POA: Diagnosis not present

## 2018-08-30 DIAGNOSIS — I2510 Atherosclerotic heart disease of native coronary artery without angina pectoris: Secondary | ICD-10-CM | POA: Diagnosis not present
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
## 2018-08-30 NOTE — Patient Instructions (Addendum)
Medication Instructions:    Your physician recommends that you continue on your current medications as directed. Please refer to the Current Medication list given to you today.  Labwork:  NONE  Testing/Procedures:  NONE  Follow-Up:  Your physician recommends that you schedule a follow-up appointment in: 3 months with Dr. Bronson Ing.  Any Other Special Instructions Will Be Listed Below (If Applicable).  You are cleared to have your surgery with Dr. Donzetta Matters.   If you need a refill on your cardiac medications before your next appointment, please call your pharmacy.

## 2018-09-04 ENCOUNTER — Other Ambulatory Visit (HOSPITAL_COMMUNITY)
Admission: RE | Admit: 2018-09-04 | Discharge: 2018-09-04 | Disposition: A | Discharge status: Home / Self Care | Payer: Medicare Other | Source: Ambulatory Visit | Attending: Vascular Surgery | Admitting: Vascular Surgery

## 2018-09-04 ENCOUNTER — Other Ambulatory Visit: Payer: Self-pay

## 2018-09-04 DIAGNOSIS — Z01812 Encounter for preprocedural laboratory examination: Secondary | ICD-10-CM | POA: Diagnosis not present

## 2018-09-04 DIAGNOSIS — Z20828 Contact with and (suspected) exposure to other viral communicable diseases: Secondary | ICD-10-CM | POA: Insufficient documentation

## 2018-09-04 LAB — SARS CORONAVIRUS 2 (TAT 6-24 HRS): SARS Coronavirus 2: NEGATIVE

## 2018-09-05 ENCOUNTER — Ambulatory Visit: Payer: Medicare Other | Admitting: Student

## 2018-09-05 ENCOUNTER — Encounter (HOSPITAL_COMMUNITY): Payer: Self-pay | Admitting: Anesthesiology

## 2018-09-05 NOTE — Progress Notes (Signed)
Pt not available. SDW-pre-op instructions given to pt sister Thayer Headings [facility]). Thayer Headings stated hat pt " phone is not working."  Please complete pt assessment on DOS.  Thayer Headings made aware to have pt not take Tradjenta on DOS.  Marcie Bal made aware to have pt check CBG every 2 hours prior to arrival to hospital on DOS. Thayer Headings made aware to have pt treat a BG < 70 with  4 ounces of apple or cranberry juice, wait 15 minutes after intervention to recheck BG, if BG remains < 70, call Short Stay unit to speak with a nurse. Thayer Headings made aware to have pt take 10 units of Tresiba insulin the morning of surgery only if CBG is > 70. Thayer Headings verbalized understanding of all pre-op instructions.  PA, Anesthesiology, made aware of pt history; see note.

## 2018-09-05 NOTE — Anesthesia Preprocedure Evaluation (Addendum)
Anesthesia Evaluation  Patient identified by MRN, date of birth, ID band Patient awake    Reviewed: Allergy & Precautions, NPO status , Patient's Chart, lab work & pertinent test results  Airway Mallampati: II  TM Distance: >3 FB Neck ROM: Full    Dental  (+) Missing,    Pulmonary former smoker,     + decreased breath sounds      Cardiovascular hypertension, Pt. on medications and Pt. on home beta blockers + CAD, + Past MI and + CABG   Rhythm:Regular Rate:Normal     Neuro/Psych negative neurological ROS  negative psych ROS   GI/Hepatic Neg liver ROS, GERD  Medicated,  Endo/Other  diabetes, Type 2, Oral Hypoglycemic Agents  Renal/GU negative Renal ROS     Musculoskeletal  (+) Arthritis ,   Abdominal Normal abdominal exam  (+)   Peds  Hematology negative hematology ROS (+)   Anesthesia Other Findings   Reproductive/Obstetrics                           Lab Results  Component Value Date   WBC 10.2 09/06/2018   HGB 13.8 09/06/2018   HCT 44.5 09/06/2018   MCV 93.1 09/06/2018   PLT 290 09/06/2018   Lab Results  Component Value Date   CREATININE 0.90 08/24/2018   BUN 15 08/24/2018   NA 135 08/24/2018   K 3.7 08/24/2018   CL 103 08/24/2018   CO2 23 08/24/2018   Lab Results  Component Value Date   INR 1.0 08/20/2018   INR 1.32 02/06/2017   INR 0.98 02/02/2017   COVID-19 Labs  No results for input(s): DDIMER, FERRITIN, LDH, CRP in the last 72 hours.  Lab Results  Component Value Date   [place] NEGATIVE 09/04/2018   [place] NEGATIVE 08/20/2018     Anesthesia Physical Anesthesia Plan  ASA: III  Anesthesia Plan: General   Post-op Pain Management:    Induction: Intravenous  PONV Risk Score and Plan: 3 and Ondansetron, Dexamethasone and Treatment may vary due to age or medical condition  Airway Management Planned: Oral ETT  Additional Equipment: Arterial  line  Intra-op Plan:   Post-operative Plan: Extubation in OR  Informed Consent: I have reviewed the patients History and Physical, chart, labs and discussed the procedure including the risks, benefits and alternatives for the proposed anesthesia with the patient or authorized representative who has indicated his/her understanding and acceptance.     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 Seen 08/30/18 for preop clearance. Per note "He has an excellent activity level.  By the Duke activity index, his functional capacity METS is 7.99. His RCRI is 3 points for a perioperative risk of major cardiac events of 11%. However, there is nothing we can do to reduce this. He is at acceptable risk for the planned procedure without additional cardiac testing."  TTE 11/11/17: - Left ventricle: The cavity size was normal. Wall thickness was   increased in a pattern of moderate LVH. Systolic function was   normal. The estimated ejection fraction was in the range of 60%   to 65%. Wall motion was normal; there were no regional wall   motion abnormalities. Doppler parameters are consistent with   abnormal left ventricular relaxation (grade 1 diastolic  dysfunction). - Aortic valve: Mean gradient (S): 9 mm Hg. Valve area (VTI): 1.41   cm^2. Valve area (Vmax): 1.29 cm^2. Valve area (Vmean): 1.5 cm^2. - Mitral valve: Mildly calcified annulus. Mildly thickened leaflets   . - Right ventricle: Systolic function was mildly to moderately   reduced. TAPSE and tissue anular velocity suggest severe RV   dysfunction, visually appears mild to moderate. - Atrial septum: No defect or patent foramen ovale was identified.  Cath 11/04/16 (pre-AVR):  Mid RCA lesion, 40 %stenosed.  Dist RCA lesion, 30 %stenosed.  Ost RPDA  lesion, 20 %stenosed.  Ost LM lesion, 20 %stenosed.  Mid Cx lesion, 100 %stenosed.  There is severe aortic valve stenosis.  Mid LAD to Dist LAD lesion, 10 %stenosed.   1. Mild non-obstructive disease in the RCA, LAD and intermediate branches.  2. 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  3. Severe aortic valve stenosis (mean gradient 31 mmHg, peak to peak gradient 27 mmHg, AVA 0.62 cm2)  Recommendations: will continue planning for AVR. He will be seen in our CT surgery office. I think the surgical consult should be obtained before we perform CT scans as he may need surgical AVR.  )   Anesthesia Quick Evaluation

## 2018-09-06 ENCOUNTER — Inpatient Hospital Stay (HOSPITAL_COMMUNITY): Payer: Medicare Other | Admitting: Physician Assistant

## 2018-09-06 ENCOUNTER — Encounter (HOSPITAL_COMMUNITY): Payer: Self-pay

## 2018-09-06 ENCOUNTER — Encounter (HOSPITAL_COMMUNITY)
Admission: RE | Disposition: A | Discharge status: Home / Self Care | Payer: Self-pay | Source: Home / Self Care | Attending: Vascular Surgery

## 2018-09-06 ENCOUNTER — Other Ambulatory Visit: Payer: Self-pay | Admitting: Vascular Surgery

## 2018-09-06 ENCOUNTER — Inpatient Hospital Stay (HOSPITAL_COMMUNITY): Payer: Medicare Other

## 2018-09-06 ENCOUNTER — Other Ambulatory Visit: Payer: Self-pay

## 2018-09-06 ENCOUNTER — Inpatient Hospital Stay (HOSPITAL_COMMUNITY)
Admission: RE | Admit: 2018-09-06 | Discharge: 2018-09-07 | DRG: 269 | Disposition: A | Discharge status: Home / Self Care | Payer: Medicare Other | Attending: Vascular Surgery | Admitting: Vascular Surgery

## 2018-09-06 DIAGNOSIS — I10 Essential (primary) hypertension: Secondary | ICD-10-CM | POA: Diagnosis not present

## 2018-09-06 DIAGNOSIS — R0602 Shortness of breath: Secondary | ICD-10-CM | POA: Diagnosis not present

## 2018-09-06 DIAGNOSIS — Z89612 Acquired absence of left leg above knee: Secondary | ICD-10-CM

## 2018-09-06 DIAGNOSIS — L7632 Postprocedural hematoma of skin and subcutaneous tissue following other procedure: Secondary | ICD-10-CM | POA: Diagnosis not present

## 2018-09-06 DIAGNOSIS — I2510 Atherosclerotic heart disease of native coronary artery without angina pectoris: Secondary | ICD-10-CM | POA: Diagnosis present

## 2018-09-06 DIAGNOSIS — E119 Type 2 diabetes mellitus without complications: Secondary | ICD-10-CM | POA: Diagnosis present

## 2018-09-06 DIAGNOSIS — I714 Abdominal aortic aneurysm, without rupture, unspecified: Secondary | ICD-10-CM | POA: Diagnosis present

## 2018-09-06 DIAGNOSIS — Z87891 Personal history of nicotine dependence: Secondary | ICD-10-CM | POA: Diagnosis not present

## 2018-09-06 DIAGNOSIS — I352 Nonrheumatic aortic (valve) stenosis with insufficiency: Secondary | ICD-10-CM | POA: Diagnosis present

## 2018-09-06 DIAGNOSIS — Z951 Presence of aortocoronary bypass graft: Secondary | ICD-10-CM

## 2018-09-06 DIAGNOSIS — E785 Hyperlipidemia, unspecified: Secondary | ICD-10-CM | POA: Diagnosis present

## 2018-09-06 DIAGNOSIS — Z79899 Other long term (current) drug therapy: Secondary | ICD-10-CM

## 2018-09-06 DIAGNOSIS — I350 Nonrheumatic aortic (valve) stenosis: Secondary | ICD-10-CM

## 2018-09-06 DIAGNOSIS — I252 Old myocardial infarction: Secondary | ICD-10-CM | POA: Diagnosis not present

## 2018-09-06 DIAGNOSIS — I708 Atherosclerosis of other arteries: Secondary | ICD-10-CM | POA: Diagnosis present

## 2018-09-06 DIAGNOSIS — Z7984 Long term (current) use of oral hypoglycemic drugs: Secondary | ICD-10-CM

## 2018-09-06 DIAGNOSIS — K219 Gastro-esophageal reflux disease without esophagitis: Secondary | ICD-10-CM | POA: Diagnosis not present

## 2018-09-06 DIAGNOSIS — Z1159 Encounter for screening for other viral diseases: Secondary | ICD-10-CM | POA: Diagnosis not present

## 2018-09-06 DIAGNOSIS — Z9889 Other specified postprocedural states: Secondary | ICD-10-CM

## 2018-09-06 HISTORY — PX: ULTRASOUND GUIDANCE FOR VASCULAR ACCESS: SHX6516

## 2018-09-06 HISTORY — PX: ABDOMINAL AORTIC ENDOVASCULAR STENT GRAFT: SHX5707

## 2018-09-06 LAB — PROTIME-INR
INR: 1.1 (ref 0.8–1.2)
INR: 1.1 (ref 0.8–1.2)
Prothrombin Time: 13.6 seconds (ref 11.4–15.2)
Prothrombin Time: 13.8 seconds (ref 11.4–15.2)

## 2018-09-06 LAB — URINALYSIS, ROUTINE W REFLEX MICROSCOPIC
Bilirubin Urine: NEGATIVE
Glucose, UA: NEGATIVE mg/dL
Hgb urine dipstick: NEGATIVE
Ketones, ur: NEGATIVE mg/dL
Leukocytes,Ua: NEGATIVE
Nitrite: NEGATIVE
Protein, ur: NEGATIVE mg/dL
Specific Gravity, Urine: 1.011 (ref 1.005–1.030)
pH: 5 (ref 5.0–8.0)

## 2018-09-06 LAB — BASIC METABOLIC PANEL
Anion gap: 8 (ref 5–15)
BUN: 10 mg/dL (ref 8–23)
CO2: 22 mmol/L (ref 22–32)
Calcium: 8.3 mg/dL — ABNORMAL LOW (ref 8.9–10.3)
Chloride: 108 mmol/L (ref 98–111)
Creatinine, Ser: 0.95 mg/dL (ref 0.61–1.24)
GFR calc Af Amer: >60 mL/min (ref >60)
GFR calc non Af Amer: >60 mL/min (ref >60)
Glucose, Bld: 207 mg/dL — ABNORMAL HIGH (ref 70–99)
Potassium: 4.1 mmol/L (ref 3.5–5.1)
Sodium: 138 mmol/L (ref 135–145)

## 2018-09-06 LAB — GLUCOSE, CAPILLARY
Glucose-Capillary: 82 mg/dL (ref 70–99)
Glucose-Capillary: 196 mg/dL — ABNORMAL HIGH (ref 70–99)
Glucose-Capillary: 168 mg/dL — ABNORMAL HIGH (ref 70–99)
Glucose-Capillary: 166 mg/dL — ABNORMAL HIGH (ref 70–99)
Glucose-Capillary: 172 mg/dL — ABNORMAL HIGH (ref 70–99)

## 2018-09-06 LAB — COMPREHENSIVE METABOLIC PANEL
ALT: 13 U/L (ref 0–44)
AST: 13 U/L — ABNORMAL LOW (ref 15–41)
Albumin: 3.2 g/dL — ABNORMAL LOW (ref 3.5–5.0)
Alkaline Phosphatase: 66 U/L (ref 38–126)
Anion gap: 9 (ref 5–15)
BUN: 13 mg/dL (ref 8–23)
CO2: 21 mmol/L — ABNORMAL LOW (ref 22–32)
Calcium: 9 mg/dL (ref 8.9–10.3)
Chloride: 112 mmol/L — ABNORMAL HIGH (ref 98–111)
Creatinine, Ser: 0.95 mg/dL (ref 0.61–1.24)
GFR calc Af Amer: >60 mL/min (ref >60)
GFR calc non Af Amer: >60 mL/min (ref >60)
Glucose, Bld: 100 mg/dL — ABNORMAL HIGH (ref 70–99)
Potassium: 4 mmol/L (ref 3.5–5.1)
Sodium: 142 mmol/L (ref 135–145)
Total Bilirubin: 0.2 mg/dL — ABNORMAL LOW (ref 0.3–1.2)
Total Protein: 6.4 g/dL — ABNORMAL LOW (ref 6.5–8.1)

## 2018-09-06 LAB — CBC
HCT: 44.5 % (ref 39.0–52.0)
HCT: 40.5 % (ref 39.0–52.0)
Hemoglobin: 13.8 g/dL (ref 13.0–17.0)
Hemoglobin: 13.1 g/dL (ref 13.0–17.0)
MCH: 28.9 pg (ref 26.0–34.0)
MCH: 29.3 pg (ref 26.0–34.0)
MCHC: 31 g/dL (ref 30.0–36.0)
MCHC: 32.3 g/dL (ref 30.0–36.0)
MCV: 93.1 fL (ref 80.0–100.0)
MCV: 90.6 fL (ref 80.0–100.0)
Platelets: 290 10*3/uL (ref 150–400)
Platelets: 258 10*3/uL (ref 150–400)
RBC: 4.78 MIL/uL (ref 4.22–5.81)
RBC: 4.47 MIL/uL (ref 4.22–5.81)
RDW: 14.4 % (ref 11.5–15.5)
RDW: 14.2 % (ref 11.5–15.5)
WBC: 10.2 10*3/uL (ref 4.0–10.5)
WBC: 15.5 10*3/uL — ABNORMAL HIGH (ref 4.0–10.5)
nRBC: 0 % (ref 0.0–0.2)
nRBC: 0 % (ref 0.0–0.2)

## 2018-09-06 LAB — TYPE AND SCREEN
ABO/RH(D): A POS
Antibody Screen: NEGATIVE

## 2018-09-06 LAB — APTT
aPTT: 28 seconds (ref 24–36)
aPTT: 29 seconds (ref 24–36)

## 2018-09-06 LAB — SURGICAL PCR SCREEN
MRSA, PCR: NEGATIVE
Staphylococcus aureus: NEGATIVE

## 2018-09-06 LAB — MAGNESIUM: Magnesium: 1.8 mg/dL (ref 1.7–2.4)

## 2018-09-06 SURGERY — INSERTION, ENDOVASCULAR STENT GRAFT, AORTA, ABDOMINAL
Anesthesia: General | Site: Groin

## 2018-09-06 MED ORDER — BISACODYL 5 MG PO TBEC: 5.0000 mg | DELAYED_RELEASE_TABLET | Freq: Every day | ORAL | Status: DC | PRN

## 2018-09-06 MED ORDER — FENTANYL CITRATE (PF) 100 MCG/2ML IJ SOLN
25.0000 ug | INTRAMUSCULAR | Status: DC | PRN
  Administered 2018-09-06: 25 ug via INTRAVENOUS

## 2018-09-06 MED ORDER — ONDANSETRON HCL 4 MG/2ML IJ SOLN
INTRAMUSCULAR | Status: AC
  Filled 2018-09-06: qty 2

## 2018-09-06 MED ORDER — FENTANYL CITRATE (PF) 250 MCG/5ML IJ SOLN
INTRAMUSCULAR | Status: AC
  Filled 2018-09-06: qty 5

## 2018-09-06 MED ORDER — MUPIROCIN 2 % EX OINT
1.0000 "application " | TOPICAL_OINTMENT | Freq: Once | CUTANEOUS | Status: AC
  Administered 2018-09-06: 07:00:00 1 via TOPICAL

## 2018-09-06 MED ORDER — ALUM & MAG HYDROXIDE-SIMETH 200-200-20 MG/5ML PO SUSP: 15.0000 mL | ORAL | Status: DC | PRN

## 2018-09-06 MED ORDER — MORPHINE SULFATE (PF) 2 MG/ML IV SOLN
1.0000 mg | INTRAVENOUS | Status: DC | PRN
  Administered 2018-09-06: 2 mg via INTRAVENOUS
  Filled 2018-09-06: qty 1

## 2018-09-06 MED ORDER — ASPIRIN EC 81 MG PO TBEC
81.0000 mg | DELAYED_RELEASE_TABLET | Freq: Every day | ORAL | Status: DC
  Administered 2018-09-06 – 2018-09-07 (×2): 81 mg via ORAL
  Filled 2018-09-06 (×2): qty 1

## 2018-09-06 MED ORDER — INSULIN GLARGINE 100 UNIT/ML ~~LOC~~ SOLN
20.0000 [IU] | Freq: Every day | SUBCUTANEOUS | Status: DC
  Administered 2018-09-06: 20 [IU] via SUBCUTANEOUS
  Filled 2018-09-06 (×2): qty 0.2

## 2018-09-06 MED ORDER — TAMSULOSIN HCL 0.4 MG PO CAPS
0.4000 mg | ORAL_CAPSULE | Freq: Every day | ORAL | Status: DC
  Administered 2018-09-06 – 2018-09-07 (×2): 0.4 mg via ORAL
  Filled 2018-09-06 (×2): qty 1

## 2018-09-06 MED ORDER — MAGNESIUM SULFATE 2 GM/50ML IV SOLN: 2.0000 g | Freq: Every day | INTRAVENOUS | Status: DC | PRN

## 2018-09-06 MED ORDER — LISINOPRIL 5 MG PO TABS: 5.0000 mg | ORAL_TABLET | Freq: Every day | ORAL | Status: DC

## 2018-09-06 MED ORDER — INSULIN ASPART 100 UNIT/ML ~~LOC~~ SOLN
0.0000 [IU] | Freq: Three times a day (TID) | SUBCUTANEOUS | Status: DC
  Administered 2018-09-06 – 2018-09-07 (×2): 2 [IU] via SUBCUTANEOUS
  Administered 2018-09-07 (×2): 1 [IU] via SUBCUTANEOUS

## 2018-09-06 MED ORDER — ACETAMINOPHEN 10 MG/ML IV SOLN
1000.0000 mg | Freq: Once | INTRAVENOUS | Status: DC | PRN
  Administered 2018-09-06: 1000 mg via INTRAVENOUS

## 2018-09-06 MED ORDER — LACTATED RINGERS IV SOLN: INTRAVENOUS | Status: DC

## 2018-09-06 MED ORDER — SODIUM CHLORIDE 0.9 % IV SOLN
INTRAVENOUS | Status: DC | PRN
  Administered 2018-09-06: 08:00:00 500 mL

## 2018-09-06 MED ORDER — CHLORHEXIDINE GLUCONATE CLOTH 2 % EX PADS: 6.0000 | MEDICATED_PAD | Freq: Once | CUTANEOUS | Status: DC

## 2018-09-06 MED ORDER — PANTOPRAZOLE SODIUM 40 MG PO TBEC: 40.0000 mg | DELAYED_RELEASE_TABLET | Freq: Every day | ORAL | Status: DC

## 2018-09-06 MED ORDER — PANTOPRAZOLE SODIUM 20 MG PO TBEC
20.0000 mg | DELAYED_RELEASE_TABLET | Freq: Two times a day (BID) | ORAL | Status: DC
  Administered 2018-09-06 – 2018-09-07 (×2): 20 mg via ORAL
  Filled 2018-09-06 (×2): qty 1

## 2018-09-06 MED ORDER — ACETAMINOPHEN 160 MG/5ML PO SOLN: 325.0000 mg | Freq: Once | ORAL | Status: DC | PRN

## 2018-09-06 MED ORDER — PHENOL 1.4 % MT LIQD: 1.0000 | OROMUCOSAL | Status: DC | PRN

## 2018-09-06 MED ORDER — LINAGLIPTIN 5 MG PO TABS
5.0000 mg | ORAL_TABLET | Freq: Every day | ORAL | Status: DC
  Administered 2018-09-06 – 2018-09-07 (×2): 5 mg via ORAL
  Filled 2018-09-06 (×2): qty 1

## 2018-09-06 MED ORDER — ESMOLOL HCL 100 MG/10ML IV SOLN
INTRAVENOUS | Status: DC | PRN
  Administered 2018-09-06 (×3): 20 mg via INTRAVENOUS

## 2018-09-06 MED ORDER — PHENYLEPHRINE HCL (PRESSORS) 10 MG/ML IV SOLN
INTRAVENOUS | Status: DC | PRN
  Administered 2018-09-06 (×2): 40 ug via INTRAVENOUS

## 2018-09-06 MED ORDER — MIDAZOLAM HCL 5 MG/5ML IJ SOLN
INTRAMUSCULAR | Status: DC | PRN
  Administered 2018-09-06: 1 mg via INTRAVENOUS

## 2018-09-06 MED ORDER — ONDANSETRON HCL 4 MG PO TABS: 8.0000 mg | ORAL_TABLET | ORAL | Status: DC | PRN

## 2018-09-06 MED ORDER — FLEET ENEMA 7-19 GM/118ML RE ENEM: 1.0000 | ENEMA | Freq: Once | RECTAL | Status: DC | PRN

## 2018-09-06 MED ORDER — DEXAMETHASONE SODIUM PHOSPHATE 10 MG/ML IJ SOLN
INTRAMUSCULAR | Status: DC | PRN
  Administered 2018-09-06: 4 mg via INTRAVENOUS

## 2018-09-06 MED ORDER — PROMETHAZINE HCL 25 MG/ML IJ SOLN: 6.2500 mg | INTRAMUSCULAR | Status: DC | PRN

## 2018-09-06 MED ORDER — ROCURONIUM BROMIDE 50 MG/5ML IV SOSY
PREFILLED_SYRINGE | INTRAVENOUS | Status: DC | PRN
  Administered 2018-09-06 (×2): 50 mg via INTRAVENOUS

## 2018-09-06 MED ORDER — CEFAZOLIN SODIUM-DEXTROSE 2-4 GM/100ML-% IV SOLN
2.0000 g | Freq: Three times a day (TID) | INTRAVENOUS | Status: AC
  Administered 2018-09-06 (×2): 2 g via INTRAVENOUS
  Filled 2018-09-06 (×2): qty 100

## 2018-09-06 MED ORDER — METOPROLOL TARTRATE 12.5 MG HALF TABLET
12.5000 mg | ORAL_TABLET | Freq: Two times a day (BID) | ORAL | Status: DC
  Administered 2018-09-06 – 2018-09-07 (×2): 12.5 mg via ORAL
  Filled 2018-09-06 (×2): qty 1

## 2018-09-06 MED ORDER — MEPERIDINE HCL 25 MG/ML IJ SOLN: 6.2500 mg | INTRAMUSCULAR | Status: DC | PRN

## 2018-09-06 MED ORDER — LIDOCAINE 2% (20 MG/ML) 5 ML SYRINGE
INTRAMUSCULAR | Status: DC | PRN
  Administered 2018-09-06: 60 mg via INTRAVENOUS

## 2018-09-06 MED ORDER — SUCRALFATE 1 GM/10ML PO SUSP: 1.0000 g | Freq: Three times a day (TID) | ORAL | Status: DC

## 2018-09-06 MED ORDER — SIMVASTATIN 20 MG PO TABS
40.0000 mg | ORAL_TABLET | Freq: Every day | ORAL | Status: DC
  Administered 2018-09-06: 40 mg via ORAL
  Filled 2018-09-06: qty 2

## 2018-09-06 MED ORDER — SENNOSIDES-DOCUSATE SODIUM 8.6-50 MG PO TABS: 1.0000 | ORAL_TABLET | Freq: Every evening | ORAL | Status: DC | PRN

## 2018-09-06 MED ORDER — HYDRALAZINE HCL 20 MG/ML IJ SOLN: 5.0000 mg | INTRAMUSCULAR | Status: DC | PRN

## 2018-09-06 MED ORDER — SODIUM CHLORIDE 0.9 % IV SOLN
INTRAVENOUS | Status: DC | PRN
  Administered 2018-09-06: 20 ug/min via INTRAVENOUS

## 2018-09-06 MED ORDER — ACETAMINOPHEN 325 MG PO TABS: 325.0000 mg | ORAL_TABLET | ORAL | Status: DC | PRN

## 2018-09-06 MED ORDER — CEFAZOLIN SODIUM-DEXTROSE 2-4 GM/100ML-% IV SOLN
2.0000 g | INTRAVENOUS | Status: AC
  Administered 2018-09-06: 2 g via INTRAVENOUS
  Filled 2018-09-06: qty 100

## 2018-09-06 MED ORDER — FENTANYL CITRATE (PF) 100 MCG/2ML IJ SOLN
INTRAMUSCULAR | Status: DC | PRN
  Administered 2018-09-06: 100 ug via INTRAVENOUS
  Administered 2018-09-06 (×3): 50 ug via INTRAVENOUS

## 2018-09-06 MED ORDER — ACETAMINOPHEN 325 MG PO TABS: 325.0000 mg | ORAL_TABLET | Freq: Once | ORAL | Status: DC | PRN

## 2018-09-06 MED ORDER — SODIUM CHLORIDE 0.9 % IV SOLN
INTRAVENOUS | Status: AC
  Filled 2018-09-06: qty 1.2

## 2018-09-06 MED ORDER — GUAIFENESIN-DM 100-10 MG/5ML PO SYRP: 15.0000 mL | ORAL_SOLUTION | ORAL | Status: DC | PRN

## 2018-09-06 MED ORDER — DOCUSATE SODIUM 100 MG PO CAPS
100.0000 mg | ORAL_CAPSULE | Freq: Every day | ORAL | Status: DC
  Administered 2018-09-07: 100 mg via ORAL
  Filled 2018-09-06: qty 1

## 2018-09-06 MED ORDER — HEPARIN SODIUM (PORCINE) 1000 UNIT/ML IJ SOLN
INTRAMUSCULAR | Status: DC | PRN
  Administered 2018-09-06: 7000 [IU] via INTRAVENOUS

## 2018-09-06 MED ORDER — OXYCODONE-ACETAMINOPHEN 5-325 MG PO TABS
1.0000 | ORAL_TABLET | ORAL | Status: DC | PRN
  Administered 2018-09-06 – 2018-09-07 (×3): 2 via ORAL
  Filled 2018-09-06 (×3): qty 2

## 2018-09-06 MED ORDER — POTASSIUM CHLORIDE CRYS ER 20 MEQ PO TBCR: 20.0000 meq | EXTENDED_RELEASE_TABLET | Freq: Every day | ORAL | Status: DC | PRN

## 2018-09-06 MED ORDER — PROPOFOL 10 MG/ML IV BOLUS
INTRAVENOUS | Status: DC | PRN
  Administered 2018-09-06: 90 mg via INTRAVENOUS

## 2018-09-06 MED ORDER — LABETALOL HCL 5 MG/ML IV SOLN
10.0000 mg | INTRAVENOUS | Status: DC | PRN
  Administered 2018-09-06 (×2): 10 mg via INTRAVENOUS
  Filled 2018-09-06 (×3): qty 4

## 2018-09-06 MED ORDER — LACTATED RINGERS IV SOLN
INTRAVENOUS | Status: DC | PRN
  Administered 2018-09-06 (×2): via INTRAVENOUS

## 2018-09-06 MED ORDER — MIDAZOLAM HCL 2 MG/2ML IJ SOLN
INTRAMUSCULAR | Status: AC
  Filled 2018-09-06: qty 2

## 2018-09-06 MED ORDER — SODIUM CHLORIDE 0.9 % IV SOLN: INTRAVENOUS | Status: DC

## 2018-09-06 MED ORDER — INSULIN DEGLUDEC 100 UNIT/ML ~~LOC~~ SOPN: 20.0000 [IU] | PEN_INJECTOR | Freq: Every morning | SUBCUTANEOUS | Status: DC

## 2018-09-06 MED ORDER — PROTAMINE SULFATE 10 MG/ML IV SOLN
INTRAVENOUS | Status: DC | PRN
  Administered 2018-09-06: 50 mg via INTRAVENOUS

## 2018-09-06 MED ORDER — SODIUM CHLORIDE 0.9 % IV SOLN: 500.0000 mL | Freq: Once | INTRAVENOUS | Status: DC | PRN

## 2018-09-06 MED ORDER — SUGAMMADEX SODIUM 200 MG/2ML IV SOLN
INTRAVENOUS | Status: DC | PRN
  Administered 2018-09-06: 150 mg via INTRAVENOUS

## 2018-09-06 MED ORDER — SODIUM CHLORIDE 0.9 % IV SOLN
INTRAVENOUS | Status: DC
  Administered 2018-09-06 (×2): via INTRAVENOUS

## 2018-09-06 MED ORDER — EPHEDRINE 5 MG/ML INJ
INTRAVENOUS | Status: AC
  Filled 2018-09-06: qty 10

## 2018-09-06 MED ORDER — METOPROLOL TARTRATE 5 MG/5ML IV SOLN
2.0000 mg | INTRAVENOUS | Status: DC | PRN
  Administered 2018-09-06: 5 mg via INTRAVENOUS
  Filled 2018-09-06: qty 5

## 2018-09-06 MED ORDER — ACETAMINOPHEN 10 MG/ML IV SOLN
INTRAVENOUS | Status: AC
  Filled 2018-09-06: qty 100

## 2018-09-06 MED ORDER — ENOXAPARIN SODIUM 30 MG/0.3ML ~~LOC~~ SOLN
30.0000 mg | SUBCUTANEOUS | Status: DC
  Administered 2018-09-07: 30 mg via SUBCUTANEOUS
  Filled 2018-09-06: qty 0.3

## 2018-09-06 MED ORDER — 0.9 % SODIUM CHLORIDE (POUR BTL) OPTIME
TOPICAL | Status: DC | PRN
  Administered 2018-09-06: 1000 mL

## 2018-09-06 MED ORDER — MUPIROCIN 2 % EX OINT
TOPICAL_OINTMENT | CUTANEOUS | Status: AC
  Administered 2018-09-06: 1 via TOPICAL
  Filled 2018-09-06: qty 22

## 2018-09-06 MED ORDER — IODIXANOL 320 MG/ML IV SOLN
INTRAVENOUS | Status: DC | PRN
  Administered 2018-09-06: 70.6 mL via INTRAVENOUS

## 2018-09-06 MED ORDER — DEXAMETHASONE SODIUM PHOSPHATE 10 MG/ML IJ SOLN
INTRAMUSCULAR | Status: AC
  Filled 2018-09-06: qty 1

## 2018-09-06 MED ORDER — ONDANSETRON HCL 4 MG/2ML IJ SOLN
4.0000 mg | Freq: Four times a day (QID) | INTRAMUSCULAR | Status: DC | PRN
  Administered 2018-09-07: 4 mg via INTRAVENOUS
  Filled 2018-09-06: qty 2

## 2018-09-06 MED ORDER — FENTANYL CITRATE (PF) 100 MCG/2ML IJ SOLN
INTRAMUSCULAR | Status: AC
  Filled 2018-09-06: qty 2

## 2018-09-06 MED ORDER — ACETAMINOPHEN 325 MG RE SUPP: 325.0000 mg | RECTAL | Status: DC | PRN

## 2018-09-06 MED ORDER — PROPOFOL 10 MG/ML IV BOLUS
INTRAVENOUS | Status: AC
  Filled 2018-09-06: qty 20

## 2018-09-06 SURGICAL SUPPLY — 67 items
ADH SKN CLS APL DERMABOND .7 (GAUZE/BANDAGES/DRESSINGS) ×4
BLADE CLIPPER SURG (BLADE) ×3 IMPLANT
CANISTER SUCT 3000ML PPV (MISCELLANEOUS) ×3 IMPLANT
CATH BEACON 5 .035 65 KMP TIP (CATHETERS) ×1 IMPLANT
CATH BEACON 5.038 65CM KMP-01 (CATHETERS) ×3 IMPLANT
CATH OMNI FLUSH .035X70CM (CATHETERS) ×3 IMPLANT
COVER WAND RF STERILE (DRAPES) ×3 IMPLANT
DERMABOND ADVANCED (GAUZE/BANDAGES/DRESSINGS) ×2
DERMABOND ADVANCED .7 DNX12 (GAUZE/BANDAGES/DRESSINGS) ×2 IMPLANT
DEVICE CLOSURE PERCLS PRGLD 6F (VASCULAR PRODUCTS) IMPLANT
DEVICE TORQUE KENDALL .025-038 (MISCELLANEOUS) IMPLANT
DRESSING OPSITE X SMALL 2X3 (GAUZE/BANDAGES/DRESSINGS) ×2 IMPLANT
DRSG TEGADERM 2-3/8X2-3/4 SM (GAUZE/BANDAGES/DRESSINGS) ×6 IMPLANT
DRYSEAL FLEXSHEATH 12FR 33CM (SHEATH) ×1
DRYSEAL FLEXSHEATH 16FR 33CM (SHEATH) ×1
ELECT REM PT RETURN 9FT ADLT (ELECTROSURGICAL) ×6
ELECTRODE REM PT RTRN 9FT ADLT (ELECTROSURGICAL) ×4 IMPLANT
EXCLUDER TNK LEG 26MX14X14 (Endovascular Graft) IMPLANT
EXCLUDER TRUNK LEG 26MX14X14 (Endovascular Graft) ×3 IMPLANT
EXTENDER ENDOPROSTHESIS 14X7 (Endovascular Graft) ×1 IMPLANT
GAUZE SPONGE 2X2 8PLY STRL LF (GAUZE/BANDAGES/DRESSINGS) ×4 IMPLANT
GLOVE BIO SURGEON STRL SZ7.5 (GLOVE) ×3 IMPLANT
GLOVE BIOGEL PI IND STRL 6.5 (GLOVE) IMPLANT
GLOVE BIOGEL PI IND STRL 7.0 (GLOVE) IMPLANT
GLOVE BIOGEL PI IND STRL 7.5 (GLOVE) IMPLANT
GLOVE BIOGEL PI INDICATOR 6.5 (GLOVE) ×4
GLOVE BIOGEL PI INDICATOR 7.0 (GLOVE) ×1
GLOVE BIOGEL PI INDICATOR 7.5 (GLOVE) ×2
GLOVE ECLIPSE 7.0 STRL STRAW (GLOVE) ×1 IMPLANT
GOWN STRL REUS W/ TWL LRG LVL3 (GOWN DISPOSABLE) ×4 IMPLANT
GOWN STRL REUS W/ TWL XL LVL3 (GOWN DISPOSABLE) ×4 IMPLANT
GOWN STRL REUS W/TWL LRG LVL3 (GOWN DISPOSABLE) ×6
GOWN STRL REUS W/TWL XL LVL3 (GOWN DISPOSABLE) ×6
GRAFT BALLN CATH 65CM (STENTS) ×2 IMPLANT
GUIDEWIRE ANGLED .035X150CM (WIRE) IMPLANT
KIT BASIN OR (CUSTOM PROCEDURE TRAY) ×3 IMPLANT
KIT ENCORE 26 ADVANTAGE (KITS) ×1 IMPLANT
KIT TURNOVER KIT B (KITS) ×3 IMPLANT
LEG CONTRALATERAL 16X12X14 (Vascular Products) ×3 IMPLANT
NDL PERC 18GX7CM (NEEDLE) IMPLANT
NEEDLE PERC 18GX7CM (NEEDLE) IMPLANT
NS IRRIG 1000ML POUR BTL (IV SOLUTION) ×3 IMPLANT
PACK ENDOVASCULAR (PACKS) ×3 IMPLANT
PAD ARMBOARD 7.5X6 YLW CONV (MISCELLANEOUS) ×6 IMPLANT
PERCLOSE PROGLIDE 6F (VASCULAR PRODUCTS) ×15
SET MICROPUNCTURE 5F STIFF (MISCELLANEOUS) ×4 IMPLANT
SHEATH BRITE TIP 8FR 23CM (SHEATH) ×3 IMPLANT
SHEATH DRYSEAL FLEX 12FR 33CM (SHEATH) IMPLANT
SHEATH DRYSEAL FLEX 16FR 33CM (SHEATH) IMPLANT
SHEATH PINNACLE 8F 10CM (SHEATH) ×3 IMPLANT
SPONGE GAUZE 2X2 STER 10/PKG (GAUZE/BANDAGES/DRESSINGS) ×2
STENT GRAFT BALLN CATH 65CM (STENTS) ×1
STENT GRAFT CONTRALAT 16X12X14 (Vascular Products) IMPLANT
STOPCOCK MORSE 400PSI 3WAY (MISCELLANEOUS) ×3 IMPLANT
SUT MNCRL AB 4-0 PS2 18 (SUTURE) ×6 IMPLANT
SUT PROLENE 5 0 C 1 24 (SUTURE) IMPLANT
SUT VIC AB 2-0 CT1 27 (SUTURE)
SUT VIC AB 2-0 CT1 TAPERPNT 27 (SUTURE) IMPLANT
SUT VIC AB 3-0 SH 27 (SUTURE)
SUT VIC AB 3-0 SH 27X BRD (SUTURE) IMPLANT
SYR 20ML LL LF (SYRINGE) ×3 IMPLANT
TOWEL GREEN STERILE (TOWEL DISPOSABLE) ×3 IMPLANT
TRAY FOLEY MTR SLVR 16FR STAT (SET/KITS/TRAYS/PACK) ×3 IMPLANT
TUBING HIGH PRESSURE 120CM (CONNECTOR) ×3 IMPLANT
WIRE AMPLATZ SS-J .035X180CM (WIRE) ×6 IMPLANT
WIRE BENTSON .035X145CM (WIRE) ×6 IMPLANT
WIRE TORQFLEX AUST .018X40CM (WIRE) ×2 IMPLANT

## 2018-09-06 NOTE — Discharge Instructions (Signed)
   [facility]   Discharge Instructions  Endovascular Aortic Aneurysm Repair  Please refer to the following instructions for your post-procedure care. Your surgeon or Physician Assistant will discuss any changes with you.  Activity  You are encouraged to walk as much as you can. You can slowly return to normal activities but must avoid strenuous activity and heavy lifting until your doctor tells you it's OK. Avoid activities such as vacuuming or swinging a gold club. It is normal to feel tired for several weeks after your surgery. Do not drive until your doctor gives the OK and you are no longer taking prescription pain medications. It is also normal to have difficulty with sleep habits, eating, and bowel movements after surgery. These will go away with time.  Bathing/Showering  You may shower after you go home. If you have an incision, do not soak in a bathtub, hot tub, or swim until the incision heals completely.  Incision Care  Shower every day. Clean your incision with mild soap and water. Pat the area dry with a clean towel. You do not need a bandage unless otherwise instructed. Do not apply any ointments or creams to your incision. If you clothing is irritating, you may cover your incision with a dry gauze pad.  Diet  Resume your normal diet. There are no special food restrictions following this procedure. A low fat/low cholesterol diet is recommended for all patients with vascular disease. In order to heal from your surgery, it is CRITICAL to get adequate nutrition. Your body requires vitamins, minerals, and protein. Vegetables are the best source of vitamins and minerals. Vegetables also provide the perfect balance of protein. Processed food has little nutritional value, so try to avoid this.  Medications  Resume taking all of your medications unless your doctor or nurse practitioner tells you not to. If your incision is causing pain, you may take  over-the-counter pain relievers such as acetaminophen (Tylenol). If you were prescribed a stronger pain medication, please be aware these medications can cause nausea and constipation. Prevent nausea by taking the medication with a snack or meal. Avoid constipation by drinking plenty of fluids and eating foods with a high amount of fiber, such as fruits, vegetables, and grains. Do not take Tylenol if you are taking prescription pain medications.   Follow up  Our office will schedule a follow-up appointment with a C.T. scan 3-4 weeks after your surgery.  Please call us immediately for any of the following conditions  Severe or worsening pain in your legs or feet or in your abdomen back or chest. Increased pain, redness, drainage (pus) from your incision sit. Increased abdominal pain, bloating, nausea, vomiting or persistent diarrhea. Fever of 101 degrees or higher. Swelling in your leg (s),  Reduce your risk of vascular disease  Stop smoking. If you would like help call [facility] at 1-800-QUIT-NOW (1-800-784-8669) or [place] at 336-586-4000. Manage your cholesterol Maintain a desired weight Control your diabetes Keep your blood pressure down  If you have questions, please call the office at [phone].   

## 2018-09-06 NOTE — Transfer of Care (Signed)
Immediate Anesthesia Transfer of Care Note  Patient: [name]  Procedure(s) Performed: ABDOMINAL AORTIC ENDOVASCULAR STENT GRAFT (N/A ) Ultrasound Guidance For Vascular Access (Bilateral Groin)  Patient Location: PACU  Anesthesia Type:General  Level of Consciousness: awake and patient cooperative  Airway & Oxygen Therapy: Patient Spontanous Breathing and Patient connected to nasal cannula oxygen  Post-op Assessment: Report given to RN and Post -op Vital signs reviewed and stable  Post vital signs: Reviewed and stable  Last Vitals:  Vitals Value Taken Time  BP 144/80 09/06/18 0939  Temp    Pulse 95 09/06/18 0942  Resp 21 09/06/18 0942  SpO2 92 % 09/06/18 0942  Vitals shown include unvalidated device data.  Last Pain:  Vitals:   09/06/18 0711  TempSrc:   PainSc: 0-No pain      Patients Stated Pain Goal: 3 (11/11/83 2778)  Complications: No apparent anesthesia complications

## 2018-09-06 NOTE — Progress Notes (Signed)
Patient arrived from PACU to 4E room 18.  Telemetry monitor applied and CCMD notified.  Patient oriented to unit and room to include call light and phone.  CHG bath done.  Frequent vitals and groin site assessment.  Will continue to monitor.

## 2018-09-06 NOTE — Anesthesia Postprocedure Evaluation (Signed)
Anesthesia Post Note  Patient: [name]  Procedure(s) Performed: ABDOMINAL AORTIC ENDOVASCULAR STENT GRAFT (N/A ) Ultrasound Guidance For Vascular Access (Bilateral Groin)     Patient location during evaluation: PACU Anesthesia Type: General Level of consciousness: awake and alert Pain management: pain level controlled Vital Signs Assessment: post-procedure vital signs reviewed and stable Respiratory status: spontaneous breathing, nonlabored ventilation, respiratory function stable and patient connected to nasal cannula oxygen Cardiovascular status: blood pressure returned to baseline and stable Postop Assessment: no apparent nausea or vomiting Anesthetic complications: no    Last Vitals:  Vitals:   09/06/18 1240 09/06/18 1255  BP: (!) 144/86 (!) 142/98  Pulse: 81   Resp: 18 18  Temp:  36.6 C  SpO2: 96% 96%                 Effie Berkshire

## 2018-09-06 NOTE — Anesthesia Procedure Notes (Signed)
Procedure Name: Intubation Date/Time: 09/06/2018 7:45 AM Performed by: Lance Coon, CRNA Pre-anesthesia Checklist: Patient identified, Emergency Drugs available, Suction available, Patient being monitored and Timeout performed Patient Re-evaluated:Patient Re-evaluated prior to induction Oxygen Delivery Method: Circle system utilized Preoxygenation: Pre-oxygenation with 100% oxygen Induction Type: IV induction Ventilation: Mask ventilation without difficulty Laryngoscope Size: Miller and 2 Grade View: Grade I Tube type: Oral Tube size: 7.5 mm Number of attempts: 1 Airway Equipment and Method: Stylet Placement Confirmation: ETT inserted through vocal cords under direct vision,  positive ETCO2 and breath sounds checked- equal and bilateral Secured at: 22 cm Tube secured with: Tape Dental Injury: Teeth and Oropharynx as per pre-operative assessment

## 2018-09-06 NOTE — Op Note (Signed)
° ° °  Patient name: [name] MRN: [id number] DOB: 08-02-1950 Sex: male  09/06/2018 Pre-operative Diagnosis: Abdominal aortic aneurysm Post-operative diagnosis:  Same Surgeon:  Erlene Quan C. Donzetta Matters, MD Assistant: Laurence Slate, PA Procedure Performed: 1.  Bilateral percutaneous access and closure common femoral arteries 2.  Aortobiiliac endograft and with main body right 26 x 14 x 14 cm extended with 14 x 7 cm to the level of the right hypogastric and left contralateral limb 12 x 14 cm's  Indications: 68 year old male with history of abdominal aortic aneurysm.  He now meets criteria for repair.  He is indicated for aortobiiliac endograft placement.  Findings: There was a long neck graft was placed at the level of the lowest left renal artery.  Right-sided limb was extended to the level of the hypogastric artery did impede some flow into the hypogastric.  Left limb was dilated given that it was hypoplastic with critical stenosis at the takeoff of the common iliac artery on the left.  At completion there were no endoleak's identified the palpable pulses on the right lower extremity at completion at the posterior tibial and dorsalis pedis arteries.  Patient has a history of a traumatic left above-knee amputation.   Procedure:  The patient was identified in the holding area and taken to the operating room where is placed upon operative general anesthesia induced.  He was sterilely prepped draped abdomen bilateral groins in usual fashion antibiotics were administered and timeout was called.  We began using ultrasound-guided evaluation of first the left common femoral artery this was cannulated with 18-gauge needle followed by Bentson wire.  2 percutaneous closure devices were placed.  An 8 French sheath was then placed.  Similarly we's ultrasound on the right common femoral artery cannulated 18-gauge needle placed a Bentson wire dilated the tract placed to close device is in place and a [place] sheath.  We  exchanged for a stiff wire into the aorta under fluoroscopic guidance.  We then placed a 16 French sheath on the right and left-sided 12 [place] sheath patient was fully heparinized.  Main body was brought to the level of L1 aortogram performed.  We deployed the main body the level of the gate.  The gate was cannulated we are able to twirl a Omni catheter within.  Stiff wire was placed.  Retrograde angiography was performed.  There is no hypogastric on the left.  The contralateral limb 12 x 14 cm's was then deployed into the gate down to the level of the common iliac artery.  We then deployed the rest of the device.  Retrograde angiogram through the main sheath demonstrate a hypogastric we extended with a 14 x 7 cm piece.  The graft was dilated with balloon.  Completion angiography demonstrated possible impingement of flow into the right hypogastric artery.  There was otherwise flow into the bilateral external iliac arteries.  There are no identifiable endoleak's.  Satisfied with this we deployed our closure devices.  Patient had good signals in the right foot he was given 50 mg of protamine which he tolerated well.  Both incisions in the groins were closed with 4 Monocryl dermal was placed the level of the skin.  He was awakened anesthesia having tolerated procedure without immediate complication.  EBL: 100 cc  [name] C. Donzetta Matters, MD [facility] Office: [phone] Pager: [phone]

## 2018-09-06 NOTE — H&P (Signed)
   History and Physical Update  The patient was interviewed and re-examined.  The patient's previous History and Physical has been reviewed and is unchanged from recent office visit. Plan for EVAR today.  Tavoris Brisk C. Donzetta Matters, MD [facility] Office: [phone] Pager: [phone]  09/06/2018, 7:24 AM

## 2018-09-06 NOTE — Progress Notes (Signed)
Patient did not take metoprolol PTA.  Will hold giving metoprolol prior to surgery, HR 55.

## 2018-09-07 ENCOUNTER — Encounter (HOSPITAL_COMMUNITY): Payer: Self-pay | Admitting: Vascular Surgery

## 2018-09-07 LAB — BASIC METABOLIC PANEL
Anion gap: 8 (ref 5–15)
BUN: 7 mg/dL — ABNORMAL LOW (ref 8–23)
CO2: 22 mmol/L (ref 22–32)
Calcium: 8.4 mg/dL — ABNORMAL LOW (ref 8.9–10.3)
Chloride: 107 mmol/L (ref 98–111)
Creatinine, Ser: 0.75 mg/dL (ref 0.61–1.24)
GFR calc Af Amer: >60 mL/min (ref >60)
GFR calc non Af Amer: >60 mL/min (ref >60)
Glucose, Bld: 158 mg/dL — ABNORMAL HIGH (ref 70–99)
Potassium: 4.1 mmol/L (ref 3.5–5.1)
Sodium: 137 mmol/L (ref 135–145)

## 2018-09-07 LAB — CBC
HCT: 38.2 % — ABNORMAL LOW (ref 39.0–52.0)
Hemoglobin: 12.3 g/dL — ABNORMAL LOW (ref 13.0–17.0)
MCH: 28.5 pg (ref 26.0–34.0)
MCHC: 32.2 g/dL (ref 30.0–36.0)
MCV: 88.6 fL (ref 80.0–100.0)
Platelets: 247 10*3/uL (ref 150–400)
RBC: 4.31 MIL/uL (ref 4.22–5.81)
RDW: 14.2 % (ref 11.5–15.5)
WBC: 14 10*3/uL — ABNORMAL HIGH (ref 4.0–10.5)
nRBC: 0 % (ref 0.0–0.2)

## 2018-09-07 LAB — GLUCOSE, CAPILLARY
Glucose-Capillary: 122 mg/dL — ABNORMAL HIGH (ref 70–99)
Glucose-Capillary: 189 mg/dL — ABNORMAL HIGH (ref 70–99)
Glucose-Capillary: 134 mg/dL — ABNORMAL HIGH (ref 70–99)

## 2018-09-07 MED ORDER — HYDROCODONE-ACETAMINOPHEN 5-325 MG PO TABS: ORAL_TABLET | ORAL | 0 refills | Status: DC
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
## 2018-09-07 NOTE — Progress Notes (Signed)
  Progress Note    09/07/2018 8:25 AM 1 Day Post-Op  Subjective: No overnight issues  Vitals:   09/07/18 0500 09/07/18 0814  BP: 130/82 (!) 171/99  Pulse: 72 72  Resp: 15 20  Temp: 97.8 F (36.6 C) 97.7 F (36.5 C)  SpO2: 91% 99%    Physical Exam: Awake alert oriented On the respirations Abdomen is soft nontender Bilateral groins are soft there is minimal hematoma on the left Right foot with palpable pulses  CBC    Component Value Date/Time   WBC 14.0 (H) 09/07/2018 0232   RBC 4.31 09/07/2018 0232   HGB 12.3 (L) 09/07/2018 0232   HGB 12.0 (L) 02/27/2017 0914   HCT 38.2 (L) 09/07/2018 0232   HCT 36.9 (L) 02/27/2017 0914   PLT 247 09/07/2018 0232   PLT 380 (H) 02/27/2017 0914   MCV 88.6 09/07/2018 0232   MCV 84 02/27/2017 0914   MCH 28.5 09/07/2018 0232   MCHC 32.2 09/07/2018 0232   RDW 14.2 09/07/2018 0232   RDW 14.5 02/27/2017 0914   LYMPHSABS 0.6 (L) 08/20/2018 1924   LYMPHSABS 2.6 11/04/2016 1012   MONOABS 0.6 08/20/2018 1924   EOSABS 0.0 08/20/2018 1924   EOSABS 0.1 11/04/2016 1012   BASOSABS 0.0 08/20/2018 1924   BASOSABS 0.0 11/04/2016 1012    BMET    Component Value Date/Time   NA 137 09/07/2018 0232   NA 137 02/28/2017 1036   K 4.1 09/07/2018 0232   CL 107 09/07/2018 0232   CO2 22 09/07/2018 0232   GLUCOSE 158 (H) 09/07/2018 0232   BUN 7 (L) 09/07/2018 0232   BUN 14 02/28/2017 1036   CREATININE 0.75 09/07/2018 0232   CALCIUM 8.4 (L) 09/07/2018 0232   GFRNONAA >60 09/07/2018 0232   GFRAA >60 09/07/2018 0232    INR    Component Value Date/Time   INR 1.1 09/06/2018 1010     Intake/Output Summary (Last 24 hours) at 09/07/2018 0825 Last data filed at 09/07/2018 0500 Gross per 24 hour  Intake 1247.23 ml  Output 2325 ml  Net -1077.77 ml     Assessment:  68 y.o. male is s/p endovascular aneurysm repair with postop small hematoma left groin now mostly resolved  Plan: Foley out this morning will need to void and ambulate prior to  discharge.  Follow-up in 4 weeks with CT scan  Patient on aspirin and statin as outpatient will continue.   Kay Ricciuti C. Donzetta Matters, MD [facility] Office: [phone] Pager: [phone]  09/07/2018 8:25 AM

## 2018-09-07 NOTE — Progress Notes (Signed)
Patient was discharged home today and picked up by family friend.  Patient's IV's were removed and patient taken off telemetry.  Patient left with all of his personal belongings.  AVS documentation was reviewed with patient and all questions were answered.

## 2018-09-10 ENCOUNTER — Telehealth: Payer: Self-pay | Admitting: Internal Medicine

## 2018-09-10 ENCOUNTER — Encounter (HOSPITAL_COMMUNITY): Payer: Self-pay | Admitting: Vascular Surgery

## 2018-09-10 NOTE — Telephone Encounter (Signed)
Patient is calling to receive his negative COVID results. Patient expressed understanding. °

## 2018-09-25 ENCOUNTER — Other Ambulatory Visit: Payer: Self-pay

## 2018-09-25 ENCOUNTER — Ambulatory Visit (INDEPENDENT_AMBULATORY_CARE_PROVIDER_SITE_OTHER): Payer: Medicare Other | Admitting: Orthopaedic Surgery

## 2018-09-25 ENCOUNTER — Encounter: Payer: Self-pay | Admitting: Orthopaedic Surgery

## 2018-09-25 VITALS — BP 145/93 | HR 74 | Ht 70.0 in | Wt 148.0 lb

## 2018-09-25 DIAGNOSIS — M25511 Pain in right shoulder: Secondary | ICD-10-CM

## 2018-09-25 DIAGNOSIS — G8929 Other chronic pain: Secondary | ICD-10-CM | POA: Diagnosis not present
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
## 2018-09-26 ENCOUNTER — Inpatient Hospital Stay: Admit: 2018-09-26 | Payer: Medicare Other

## 2018-10-04 DIAGNOSIS — I2510 Atherosclerotic heart disease of native coronary artery without angina pectoris: Secondary | ICD-10-CM | POA: Diagnosis not present

## 2018-10-04 DIAGNOSIS — M1990 Unspecified osteoarthritis, unspecified site: Secondary | ICD-10-CM | POA: Diagnosis not present

## 2018-10-04 DIAGNOSIS — E1129 Type 2 diabetes mellitus with other diabetic kidney complication: Secondary | ICD-10-CM | POA: Diagnosis not present

## 2018-10-04 DIAGNOSIS — Z79899 Other long term (current) drug therapy: Secondary | ICD-10-CM | POA: Diagnosis not present

## 2018-10-05 ENCOUNTER — Ambulatory Visit: Payer: Medicare Other | Admitting: Vascular Surgery

## 2018-10-05 ENCOUNTER — Other Ambulatory Visit: Payer: Self-pay | Admitting: Vascular Surgery

## 2018-10-05 DIAGNOSIS — I714 Abdominal aortic aneurysm, without rupture, unspecified: Secondary | ICD-10-CM

## 2018-10-05 DIAGNOSIS — I350 Nonrheumatic aortic (valve) stenosis: Secondary | ICD-10-CM

## 2018-10-08 ENCOUNTER — Other Ambulatory Visit: Payer: Self-pay

## 2018-10-08 NOTE — Patient Outreach (Signed)
[facility]) Care Management  10/08/2018  GIOVONNI POIRIER 1950-08-20 [id number]   Medication Adherence call to Mr. Dimitris Shanahan HIPPA Compliant Voice message left with a call back number. Mr. Boggus is showing past due on Simvastatin 40 mg under [place].   [facility] Management Direct Dial [phone]  Fax [phone] [email or web address]

## 2018-10-10 DIAGNOSIS — I700 Atherosclerosis of aorta: Secondary | ICD-10-CM | POA: Diagnosis not present

## 2018-10-10 DIAGNOSIS — E1129 Type 2 diabetes mellitus with other diabetic kidney complication: Secondary | ICD-10-CM | POA: Diagnosis not present

## 2018-10-10 DIAGNOSIS — E785 Hyperlipidemia, unspecified: Secondary | ICD-10-CM | POA: Diagnosis not present

## 2018-10-18 ENCOUNTER — Other Ambulatory Visit: Payer: Self-pay

## 2018-10-18 NOTE — Patient Outreach (Signed)
[facility]) Care Management  10/18/2018  ARMISTEAD SULT 02-Nov-1950 [id number]   Medication Adherence call to Mr. Maruice Pieroni Telephone call to Patient regarding Medication Adherence unable to reach patient. Mr. Boze is showing past due on Simvastatin 40 mg and Lisinopril 5 mg under [place].   [facility] Management Direct Dial [phone]  Fax [phone] [email or web address]

## 2018-10-24 ENCOUNTER — Ambulatory Visit
Admission: RE | Admit: 2018-10-24 | Discharge: 2018-10-24 | Disposition: A | Discharge status: Home / Self Care | Payer: Medicare Other | Source: Ambulatory Visit | Attending: Vascular Surgery | Admitting: Vascular Surgery

## 2018-10-24 DIAGNOSIS — I350 Nonrheumatic aortic (valve) stenosis: Secondary | ICD-10-CM

## 2018-10-24 DIAGNOSIS — I714 Abdominal aortic aneurysm, without rupture, unspecified: Secondary | ICD-10-CM

## 2018-10-24 DIAGNOSIS — I708 Atherosclerosis of other arteries: Secondary | ICD-10-CM | POA: Diagnosis not present

## 2018-10-24 MED ORDER — IOPAMIDOL (ISOVUE-370) INJECTION 76%
75.0000 mL | Freq: Once | INTRAVENOUS | Status: AC | PRN
  Administered 2018-10-24: 75 mL via INTRAVENOUS

## 2018-10-26 ENCOUNTER — Other Ambulatory Visit: Payer: Self-pay

## 2018-10-26 ENCOUNTER — Ambulatory Visit (INDEPENDENT_AMBULATORY_CARE_PROVIDER_SITE_OTHER): Payer: Self-pay | Admitting: Vascular Surgery

## 2018-10-26 ENCOUNTER — Encounter: Payer: Self-pay | Admitting: Vascular Surgery

## 2018-10-26 VITALS — BP 140/77 | HR 59 | Temp 97.2°F | Resp 20 | Ht 70.0 in | Wt 155.0 lb

## 2018-10-26 DIAGNOSIS — I714 Abdominal aortic aneurysm, without rupture, unspecified: Secondary | ICD-10-CM

## 2018-10-26 NOTE — Progress Notes (Signed)
    Subjective:     Patient ID: [name], male   DOB: [birth year], 68 y.o.   MRN: [id number]  HPI 68 year old male history of abdominal aortic aneurysm repair endovascularly.  Postop he did have a small hematoma in the left groin.  He could not wear his prosthetic for a couple weeks.  He is now back to wearing it.  Has no new groin pain.  Overall he is doing well   Review of Systems No complaints today    Objective:   Physical Exam  Vitals:   10/26/18 1024  BP: 140/77  Pulse: (!) 59  Resp: 20  Temp: (!) 97.2 F (36.2 C)  SpO2: 98%  Awake alert oriented No lab respirations No pulsatile mass on abdomen Bilateral common femoral pulses are palpable Left groin has a strongly palpable pulse I do not palpate any enlarged pseudoaneurysms Left high above-knee amputation  I reviewed his CT scan which demonstrates sealing of the aneurysm with some thrombus laden in the left limb.  There is a [place] pseudoaneurysm in the left common femoral artery.    Assessment/plan     68 yo male status post endovascular aneurysm repair.  Postoperatively he did have a hematoma could not wear his prosthetic for a few weeks.  Now he has notable pseudoaneurysm in the groin.  Patient would rather not have this repaired given that he would not be able to walk.  We will follow him up in 6 months with repeat CT scan to evaluate also because there is thrombus in the left limb.  If he has issues with the left lower extremity he will call immediately to be evaluated.     Joylynn Defrancesco C. Donzetta Matters, MD [facility] Office: [phone] Pager: [phone]

## 2018-11-09 ENCOUNTER — Encounter: Payer: Self-pay | Admitting: Orthopaedic Surgery

## 2018-11-27 ENCOUNTER — Ambulatory Visit: Payer: Medicare Other | Admitting: Orthopaedic Surgery

## 2018-11-27 ENCOUNTER — Other Ambulatory Visit: Payer: Self-pay

## 2018-11-27 ENCOUNTER — Encounter: Payer: Self-pay | Admitting: Orthopaedic Surgery

## 2018-11-27 ENCOUNTER — Ambulatory Visit (INDEPENDENT_AMBULATORY_CARE_PROVIDER_SITE_OTHER): Payer: Medicare Other | Admitting: Orthopaedic Surgery

## 2018-11-27 VITALS — BP 111/65 | HR 53 | Temp 97.3°F | Ht 70.0 in | Wt 156.0 lb

## 2018-11-27 DIAGNOSIS — G8929 Other chronic pain: Secondary | ICD-10-CM | POA: Diagnosis not present

## 2018-11-27 DIAGNOSIS — M25511 Pain in right shoulder: Secondary | ICD-10-CM

## 2018-11-27 NOTE — Progress Notes (Signed)
CC:  My shoulder is much better  He has minimal pain of the right shoulder.  He has no new trauma.   ROM is nearly full without pain.  NV intact.  Encounter Diagnosis  Name Primary?  . Chronic right shoulder pain Yes   I will see as needed.  Call if any problem.  Precautions discussed.   Electronically Signed Sanjuana Kava, MD 11/10/202010:14 AM

## 2018-11-30 ENCOUNTER — Other Ambulatory Visit: Payer: Self-pay | Admitting: Vascular Surgery

## 2018-11-30 DIAGNOSIS — I714 Abdominal aortic aneurysm, without rupture, unspecified: Secondary | ICD-10-CM

## 2018-11-30 DIAGNOSIS — I350 Nonrheumatic aortic (valve) stenosis: Secondary | ICD-10-CM

## 2018-12-04 ENCOUNTER — Ambulatory Visit: Payer: Medicare Other | Admitting: Orthopaedic Surgery

## 2018-12-06 ENCOUNTER — Telehealth: Payer: Self-pay | Admitting: Cardiovascular Disease

## 2018-12-06 ENCOUNTER — Telehealth (INDEPENDENT_AMBULATORY_CARE_PROVIDER_SITE_OTHER): Payer: Medicare Other | Admitting: Cardiovascular Disease

## 2018-12-06 ENCOUNTER — Encounter: Payer: Self-pay | Admitting: Cardiovascular Disease

## 2018-12-06 ENCOUNTER — Telehealth: Payer: Self-pay | Admitting: Licensed Clinical Social Worker

## 2018-12-06 VITALS — Ht 70.0 in | Wt 157.0 lb

## 2018-12-06 DIAGNOSIS — I25118 Atherosclerotic heart disease of native coronary artery with other forms of angina pectoris: Secondary | ICD-10-CM

## 2018-12-06 DIAGNOSIS — E785 Hyperlipidemia, unspecified: Secondary | ICD-10-CM

## 2018-12-06 DIAGNOSIS — Z952 Presence of prosthetic heart valve: Secondary | ICD-10-CM

## 2018-12-06 DIAGNOSIS — I10 Essential (primary) hypertension: Secondary | ICD-10-CM

## 2018-12-06 NOTE — Telephone Encounter (Signed)
CSW referred to assist patient with obtaining a BP cuff. CSW contacted patient to inform cuff will be delivered to home. Patient grateful for support and assistance. CSW available as needed. Jackie Harald Quevedo, LCSW, CCSW-MCS [phone]  

## 2018-12-06 NOTE — Telephone Encounter (Signed)
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
## 2018-12-06 NOTE — Patient Instructions (Signed)
Medication Instructions:  Your physician recommends that you continue on your current medications as directed. Please refer to the Current Medication list given to you today.  *If you need a refill on your cardiac medications before your next appointment, please call your pharmacy*  Lab Work: None today If you have labs (blood work) drawn today and your tests are completely normal, you will receive your results only by: . MyChart Message (if you have MyChart) OR . A paper copy in the mail If you have any lab test that is abnormal or we need to change your treatment, we will call you to review the results.  Testing/Procedures: None today  Follow-Up: At [facility], you and your health needs are our priority.  As part of our continuing mission to provide you with exceptional heart care, we have created designated Provider Care Teams.  These Care Teams include your primary Cardiologist (physician) and Advanced Practice Providers (APPs -  Physician Assistants and Nurse Practitioners) who all work together to provide you with the care you need, when you need it.  Your next appointment:   12 months  The format for your next appointment:   In Person  Provider:   Suresh Koneswaran, MD  Other Instructions None      Thank you for choosing [facility] !         

## 2019-01-08 DIAGNOSIS — R6889 Other general symptoms and signs: Secondary | ICD-10-CM | POA: Diagnosis not present

## 2019-01-14 DIAGNOSIS — Z23 Encounter for immunization: Secondary | ICD-10-CM | POA: Diagnosis not present

## 2019-03-13 DIAGNOSIS — Z7689 Persons encountering health services in other specified circumstances: Secondary | ICD-10-CM | POA: Diagnosis not present

## 2019-03-13 DIAGNOSIS — N390 Urinary tract infection, site not specified: Secondary | ICD-10-CM | POA: Diagnosis not present

## 2019-04-03 IMAGING — DX DG CHEST 1V PORT
1 series · 1 of 1 positions shown · non-contrast
Comparison: Chest x-ray from same day at [DATE] a.m.

CLINICAL DATA: AVR.

EXAM:
PORTABLE CHEST 1 VIEW

[chest]
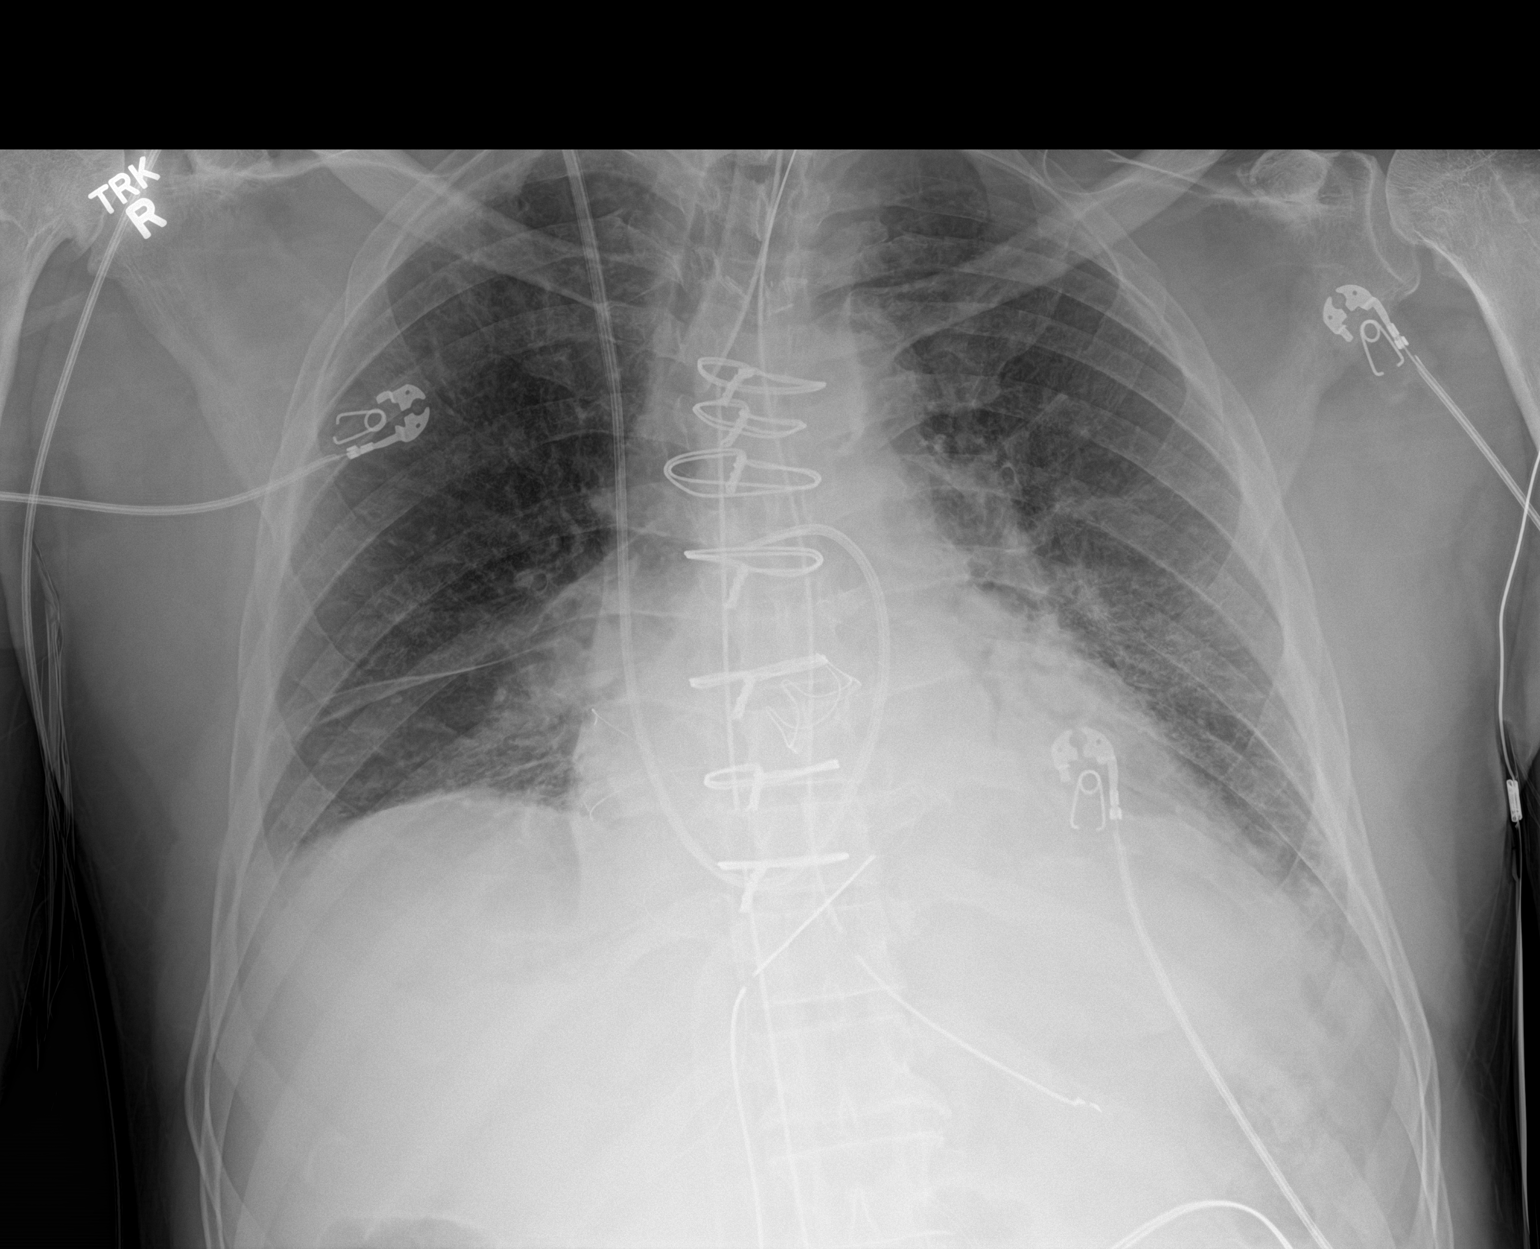

[1 of 1 positions shown; findings below may reference images not displayed]

FINDINGS: Endotracheal tube in position with the tip 3.1 cm above the level of
the carina. Enteric tube entering the stomach with the distal side
port in the distal esophagus. Right internal jugular Swan-Ganz
catheter with the tip in the proximal right pulmonary artery.
Mediastinal chest tubes in good position.

Interval aortic valve replacement. Mild cardiomegaly and pulmonary
vascular congestion. Low lung volumes with bronchovascular crowding.
Bibasilar atelectasis. Small bilateral pleural effusions. No
pneumothorax. No acute osseous abnormality.
IMPRESSION: 1. Interval aortic valve replacement. Enteric tube with the distal
side port in the distal esophagus. Recommend advancement.
2. Remaining support tubes and apparatus are appropriately
positioned.
3. New left greater than right lung base atelectasis with small
bilateral pleural effusions.

## 2019-04-09 ENCOUNTER — Other Ambulatory Visit: Payer: Self-pay

## 2019-04-09 DIAGNOSIS — I743 Embolism and thrombosis of arteries of the lower extremities: Secondary | ICD-10-CM

## 2019-04-23 ENCOUNTER — Ambulatory Visit
Admission: RE | Admit: 2019-04-23 | Discharge: 2019-04-23 | Disposition: A | Discharge status: Home / Self Care | Payer: Medicare Other | Source: Ambulatory Visit | Attending: Vascular Surgery | Admitting: Vascular Surgery

## 2019-04-23 DIAGNOSIS — I714 Abdominal aortic aneurysm, without rupture: Secondary | ICD-10-CM | POA: Diagnosis not present

## 2019-04-23 DIAGNOSIS — I743 Embolism and thrombosis of arteries of the lower extremities: Secondary | ICD-10-CM

## 2019-04-23 MED ORDER — IOPAMIDOL (ISOVUE-370) INJECTION 76%
75.0000 mL | Freq: Once | INTRAVENOUS | Status: AC | PRN
  Administered 2019-04-23: 15:00:00 75 mL via INTRAVENOUS

## 2019-04-26 ENCOUNTER — Ambulatory Visit: Payer: Medicare Other | Admitting: Vascular Surgery

## 2019-04-26 ENCOUNTER — Other Ambulatory Visit: Payer: Self-pay | Admitting: Physician Assistant

## 2019-05-16 ENCOUNTER — Telehealth (HOSPITAL_COMMUNITY): Payer: Self-pay

## 2019-05-16 NOTE — Telephone Encounter (Signed)

## 2019-05-17 ENCOUNTER — Ambulatory Visit (INDEPENDENT_AMBULATORY_CARE_PROVIDER_SITE_OTHER): Payer: Medicare Other | Admitting: Vascular Surgery

## 2019-05-17 ENCOUNTER — Encounter: Payer: Self-pay | Admitting: Vascular Surgery

## 2019-05-17 ENCOUNTER — Other Ambulatory Visit: Payer: Self-pay

## 2019-05-17 VITALS — BP 143/90 | HR 77 | Temp 97.4°F | Resp 18 | Ht 70.0 in | Wt 159.0 lb

## 2019-05-17 DIAGNOSIS — I714 Abdominal aortic aneurysm, without rupture, unspecified: Secondary | ICD-10-CM
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
## 2019-05-20 ENCOUNTER — Other Ambulatory Visit: Payer: Self-pay | Admitting: *Deleted

## 2019-05-20 DIAGNOSIS — I714 Abdominal aortic aneurysm, without rupture, unspecified: Secondary | ICD-10-CM

## 2019-06-26 ENCOUNTER — Other Ambulatory Visit: Payer: Self-pay | Admitting: *Deleted

## 2019-06-26 NOTE — Patient Outreach (Signed)
[facility]) Care Management  06/26/2019  [name] June 18, 1950 [id number]   Outreach attempt to patient. No answer and unable to leave voicemail message due to patient's voice mailbox is not set up.  Plan: Nurse Health Coach will call patient within the next several business days and will send an unsuccessful letter to patient.   Blanchie Serve RN, BSN [facility] Care Management  RN Health Coach [phone] [name]

## 2019-06-27 ENCOUNTER — Other Ambulatory Visit: Payer: Medicare Other | Admitting: *Deleted

## 2019-06-27 NOTE — Patient Outreach (Signed)
[facility]) Care Management  06/27/2019  [name] [birth year] [id number]  Successful telephone outreach call to patient for screening. HIPAA identifiers obtained. Nurse reviewed [facility] program and services with patient. Patient stated that he did not feel like the services would benefit him at this time. Patient did state that he would contact [facility] if he needed the services in the future. A [facility] pamphlet was previously sent to the patient.   Blanchie Serve RN, BSN [facility] Care Management  RN Health Coach [phone] [name]

## 2019-06-28 ENCOUNTER — Ambulatory Visit: Payer: Medicare Other | Admitting: *Deleted

## 2019-10-11 ENCOUNTER — Other Ambulatory Visit: Payer: Self-pay | Admitting: Student

## 2019-10-11 MED ORDER — LISINOPRIL 5 MG PO TABS: 5.0000 mg | ORAL_TABLET | Freq: Every day | ORAL | 1 refills | Status: DC

## 2019-11-20 ENCOUNTER — Other Ambulatory Visit: Payer: Self-pay

## 2019-11-20 DIAGNOSIS — I714 Abdominal aortic aneurysm, without rupture, unspecified: Secondary | ICD-10-CM

## 2019-11-21 ENCOUNTER — Other Ambulatory Visit: Payer: Self-pay

## 2019-11-21 ENCOUNTER — Ambulatory Visit (HOSPITAL_COMMUNITY)
Admission: RE | Admit: 2019-11-21 | Discharge: 2019-11-21 | Disposition: A | Discharge status: Home / Self Care | Payer: Medicare HMO | Source: Ambulatory Visit | Attending: Vascular Surgery | Admitting: Vascular Surgery

## 2019-11-21 ENCOUNTER — Ambulatory Visit (INDEPENDENT_AMBULATORY_CARE_PROVIDER_SITE_OTHER): Payer: Medicare HMO | Admitting: Physician Assistant

## 2019-11-21 VITALS — BP 118/79 | HR 61 | Temp 98.7°F | Resp 20 | Ht 70.0 in | Wt 148.2 lb

## 2019-11-21 DIAGNOSIS — I714 Abdominal aortic aneurysm, without rupture, unspecified: Secondary | ICD-10-CM
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
## 2019-11-29 IMAGING — MR MR SHOULDER*R* W/O CM
4 of 5 series · 18 of 40 positions shown · non-contrast
Comparison: Radiographs from 09/27/2017

CLINICAL DATA: Hemi in right shoulder pain for 2 years

EXAM:
MRI OF THE RIGHT SHOULDER WITHOUT CONTRAST
TECHNIQUE: Multiplanar, multisequence MR imaging of the shoulder was performed.
No intravenous contrast was administered.

[Series 3: pdfs axial · axial · 4.0mm · 0.24mm/px · z∈[-27,+40]mm · 3 of 20 slices shown]
[im 3/20]
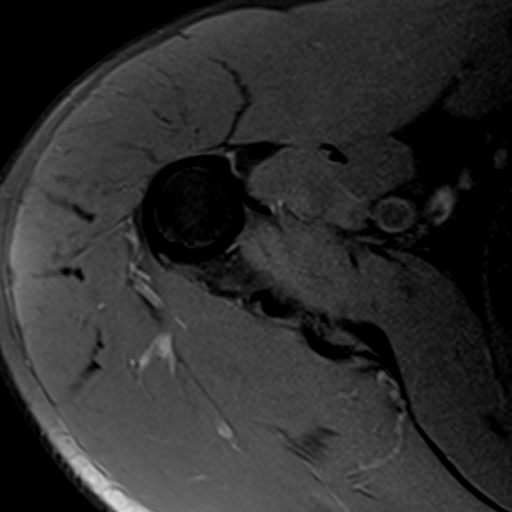
[im 11/20]
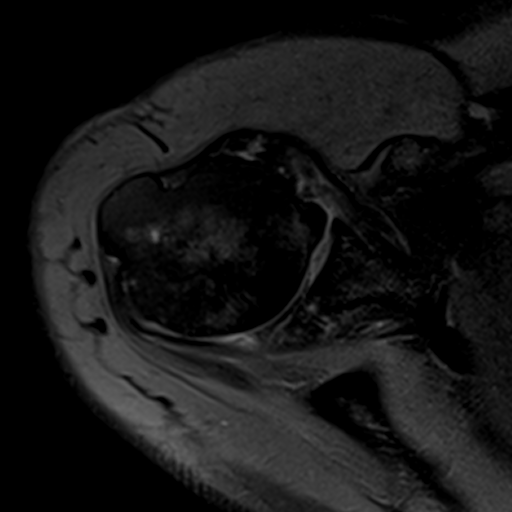
[im 17/20]
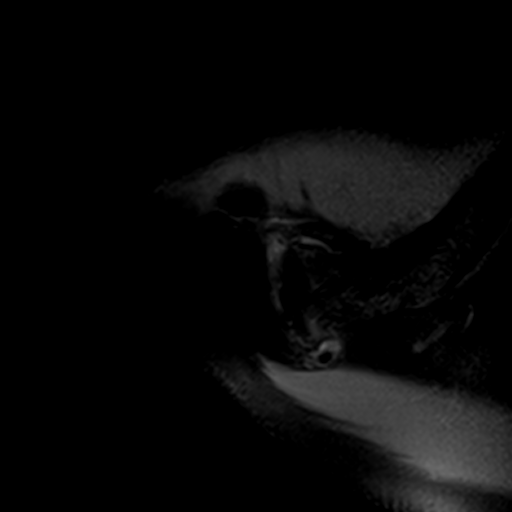

[Series 4: T1 · oblique · 4.0mm · 0.22mm/px · 9 of 24 slices shown]
[im 1/24]
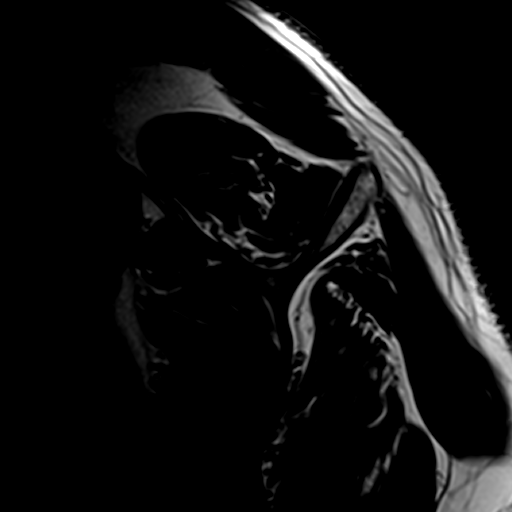
[im 3/24]
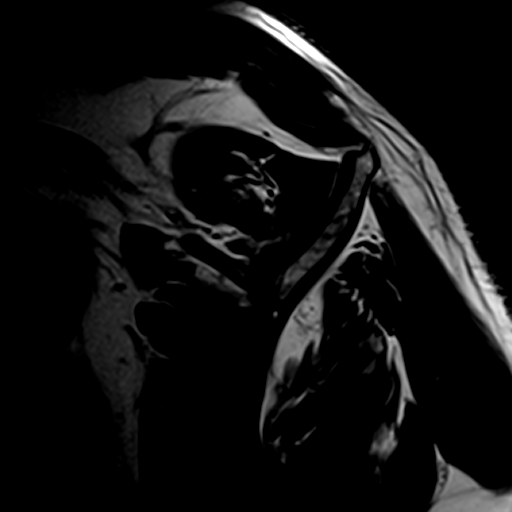
[im 6/24]
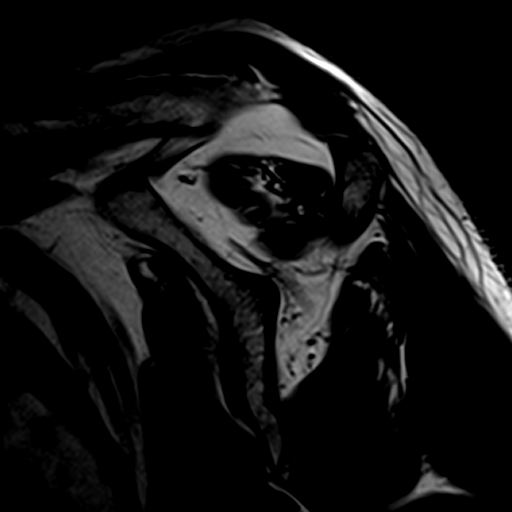
[im 9/24]
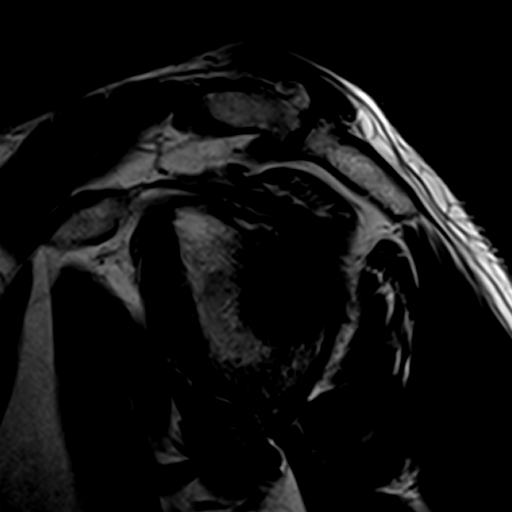
[im 12/24]
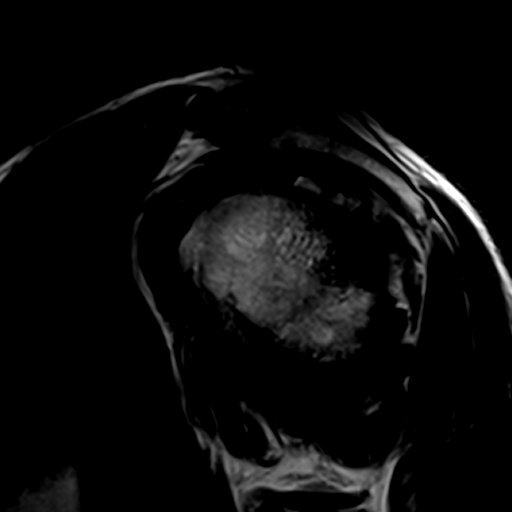
[im 15/24]
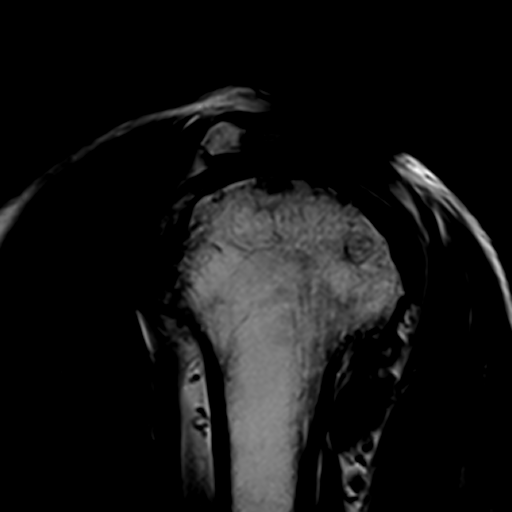
[im 18/24]
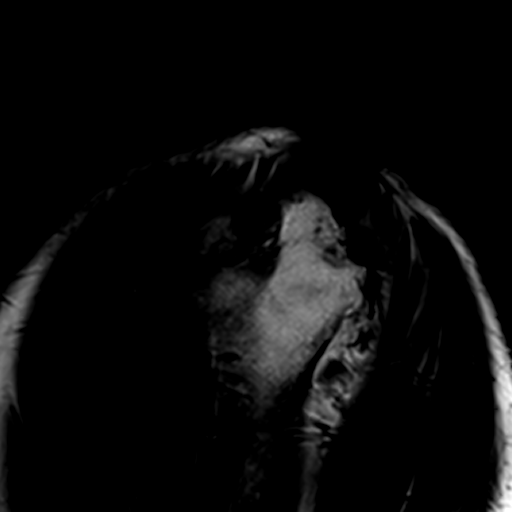
[im 21/24]
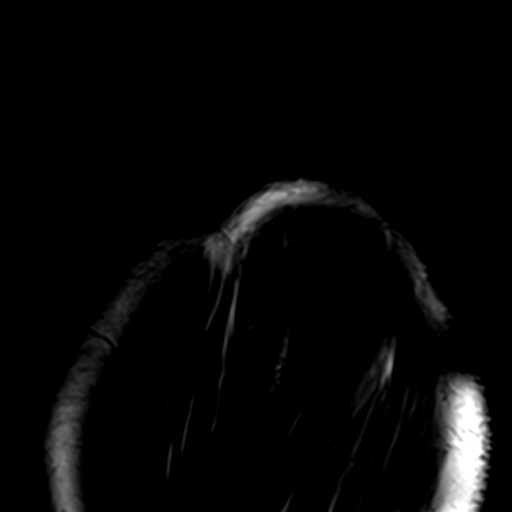
[im 24/24]
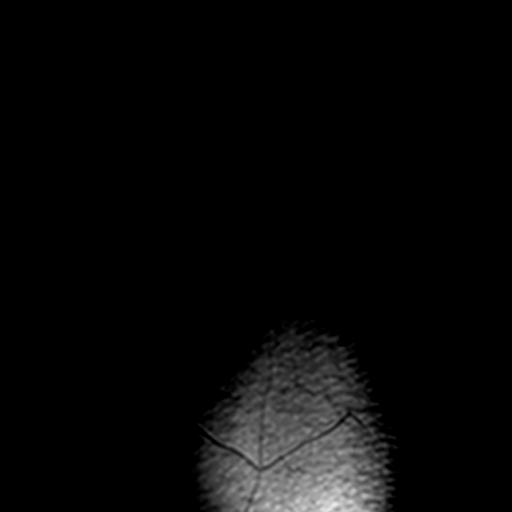

[Series 5: t2fs sagital · oblique · 4.0mm · 0.22mm/px · 3 of 24 slices shown]
[im 3/24]
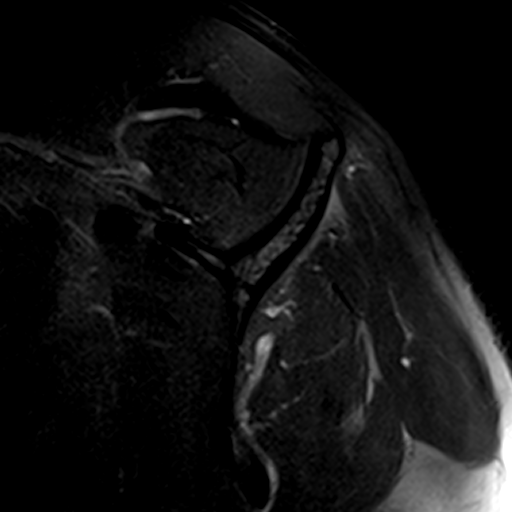
[im 12/24]
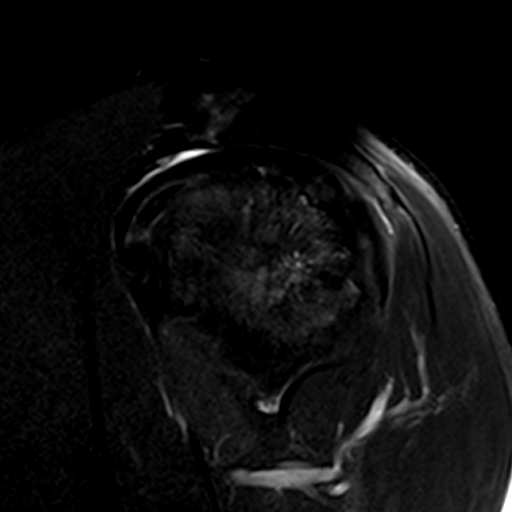
[im 21/24]
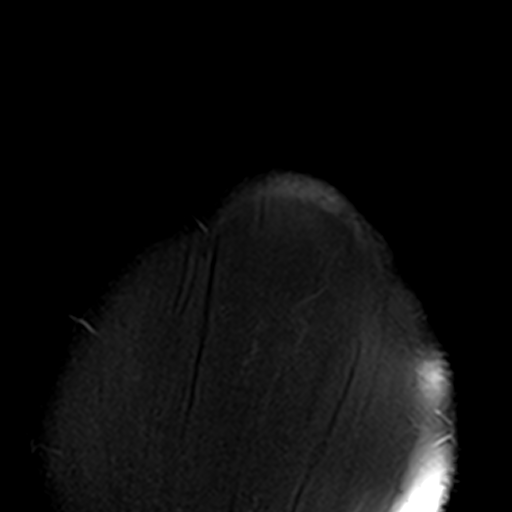

[Series 6: t2fs coronal · oblique · 4.0mm · 0.23mm/px · 3 of 20 slices shown]
[im 4/20]
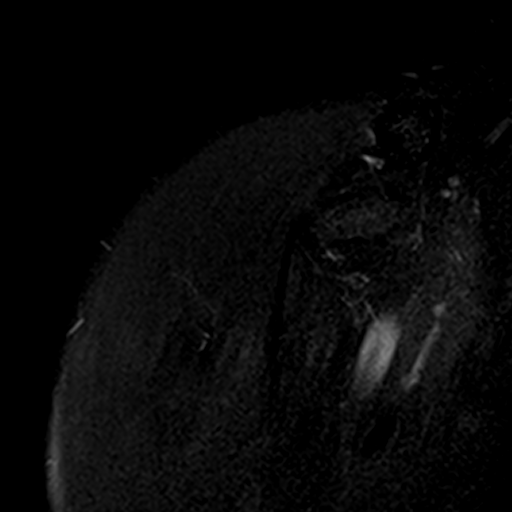
[im 10/20]
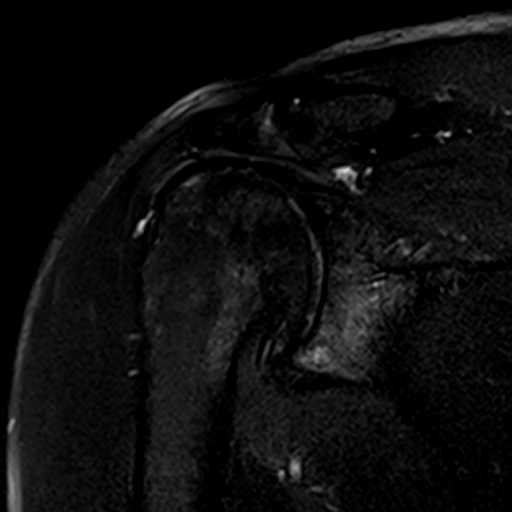
[im 16/20]
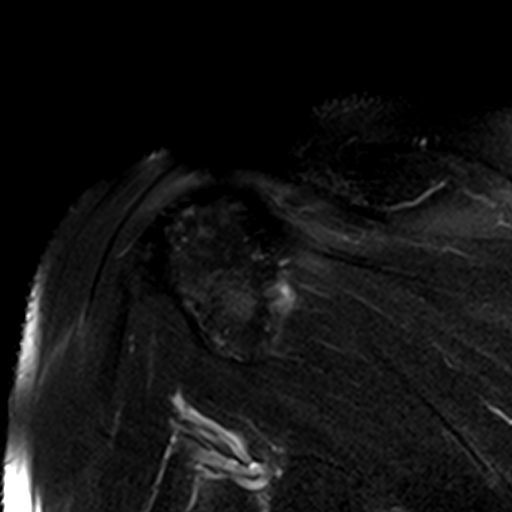

[18 of 40 positions shown; findings below may reference images not displayed]

FINDINGS: Rotator cuff: Thinning of the distal supraspinatus tendon may
reflect some degree of partial thickness articular surface tearing
with tendon delamination. There is moderate tendinopathy of the
anterior leading edge of the distal supraspinatus and tendon which
is indistinct. There are ossicles within or along the articular side
of the supraspinatus tendon on image [DATE]. No full-thickness tear is
observed. Mild infraspinatus tendinopathy.

Muscles:  Unremarkable

Biceps long head:  Mild tendinopathy of the intra-articular segment.

Acromioclavicular Joint: Moderate spurring and mild degenerative
subcortical edema along the AC joint. Subacromial morphology is type
2 (curved). There is a small pre acromial os acromiale. Trace fluid
in the subacromial subdeltoid bursa.

Glenohumeral Joint: Severe degenerative glenohumeral arthropathy
with prominent chondral thinning and prominent spurring of the
femoral head. There is some irregularity of the humeral head which
could be from old fracture or chronic degenerative subsidence.
Scattered degenerative subcortical cystic lesions are present along
the anatomic neck of the humerus. Subcortical marrow edema in the
glenoid especially inferiorly and posteriorly.

Labrum: Accentuated signal in the posterior labrum is likely
degenerative. I do not see a well-defined tear but degenerative
tearing in the posterior labrum is difficult to confidently exclude
given this signal.

Bones:  No additional significant bony findings.

Other: No supplemental non-categorized findings.
IMPRESSION: 1. Partial-thickness articular surface tearing of the supraspinatus
tendon with tendon delamination causing distal tendon thinning.
There is moderate anterior supraspinatus tendinopathy. Also there
are bony ossicles along the articular margin of the supraspinatus
tendon posteriorly which may reflect free osteochondral fragments
loose in the joint along the tendon, or possibly ossific
tendinopathy.
2. Mild infraspinatus and biceps tendinopathy.
3. Severe degenerative glenohumeral arthropathy and moderate
degenerative AC joint arthropathy. Small pre acromial os acromiale.
4. Trace subacromial subdeltoid bursitis.
5. There is some deformity and irregularity of the humeral head
which is probably from an old healed fracture, less likely from
chronic degenerative subsidence.
6. Degenerative subcortical marrow edema in the glenoid especially
inferiorly and posteriorly.
7. Degeneration of the posterior labrum.
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
## 2019-12-09 ENCOUNTER — Ambulatory Visit (INDEPENDENT_AMBULATORY_CARE_PROVIDER_SITE_OTHER): Payer: Medicare HMO | Admitting: Physician Assistant

## 2019-12-09 ENCOUNTER — Other Ambulatory Visit: Payer: Self-pay

## 2019-12-09 ENCOUNTER — Encounter: Payer: Self-pay | Admitting: Physician Assistant

## 2019-12-09 VITALS — BP 116/64 | HR 80 | Wt 170.0 lb

## 2019-12-09 DIAGNOSIS — I2510 Atherosclerotic heart disease of native coronary artery without angina pectoris: Secondary | ICD-10-CM

## 2019-12-09 DIAGNOSIS — Z8679 Personal history of other diseases of the circulatory system: Secondary | ICD-10-CM | POA: Diagnosis not present

## 2019-12-09 DIAGNOSIS — E785 Hyperlipidemia, unspecified: Secondary | ICD-10-CM | POA: Diagnosis not present

## 2019-12-09 DIAGNOSIS — Z952 Presence of prosthetic heart valve: Secondary | ICD-10-CM | POA: Diagnosis not present

## 2019-12-09 DIAGNOSIS — Z9889 Other specified postprocedural states: Secondary | ICD-10-CM

## 2019-12-09 DIAGNOSIS — I10 Essential (primary) hypertension: Secondary | ICD-10-CM | POA: Diagnosis not present

## 2019-12-09 DIAGNOSIS — R0989 Other specified symptoms and signs involving the circulatory and respiratory systems: Secondary | ICD-10-CM | POA: Diagnosis not present

## 2019-12-09 NOTE — Patient Instructions (Signed)
Medication Instructions:  Your physician recommends that you continue on your current medications as directed. Please refer to the Current Medication list given to you today.  *If you need a refill on your cardiac medications before your next appointment, please call your pharmacy*   Lab Work: None today If you have labs (blood work) drawn today and your tests are completely normal, you will receive your results only by:  MyChart Message (if you have MyChart) OR  A paper copy in the mail If you have any lab test that is abnormal or we need to change your treatment, we will call you to review the results.   Testing/Procedures: Your physician has requested that you have a carotid duplex. This test is an ultrasound of the carotid arteries in your neck. It looks at blood flow through these arteries that supply the brain with blood. Allow one hour for this exam. There are no restrictions or special instructions.     Follow-Up: At [facility], you and your health needs are our priority.  As part of our continuing mission to provide you with exceptional heart care, we have created designated Provider Care Teams.  These Care Teams include your primary Cardiologist (physician) and Advanced Practice Providers (APPs -  Physician Assistants and Nurse Practitioners) who all work together to provide you with the care you need, when you need it.  We recommend signing up for the patient portal called "MyChart".  Sign up information is provided on this After Visit Summary.  MyChart is used to connect with patients for Virtual Visits (Telemedicine).  Patients are able to view lab/test results, encounter notes, upcoming appointments, etc.  Non-urgent messages can be sent to your provider as well.   To learn more about what you can do with MyChart, go to [email or web address].    Your next appointment:   12 month(s)  The format for your next appointment:   In Person  Provider:   With  M.D.   Other Instructions None      Thank you for choosing [facility] !

## 2019-12-10 ENCOUNTER — Telehealth: Payer: Self-pay | Admitting: Licensed Clinical Social Worker

## 2019-12-10 NOTE — Progress Notes (Signed)
CSW received referral to assist patient with financial resources to cover the cost of his heating. Patient reports he is currently heating his home with a portable heater as he is out of propane. Patient supplied the name of the propane company and CSW contacted who reports patient has a $331 credit of his account from last year. They will deliver propane today. CSW also discussed with patient he LIEAP program and provided process to re apply for this coming year. Patient verbalizes understanding of follow up and grateful for the propane delivery today. CSW continues to follow as needed. Lasandra Beech, LCSW, CCSW-MCS [phone]

## 2019-12-19 ENCOUNTER — Ambulatory Visit (HOSPITAL_COMMUNITY)
Admission: RE | Admit: 2019-12-19 | Discharge: 2019-12-19 | Disposition: A | Discharge status: Home / Self Care | Payer: Medicare HMO | Source: Ambulatory Visit | Attending: Physician Assistant | Admitting: Physician Assistant

## 2019-12-19 ENCOUNTER — Other Ambulatory Visit: Payer: Self-pay

## 2019-12-19 DIAGNOSIS — R0989 Other specified symptoms and signs involving the circulatory and respiratory systems: Secondary | ICD-10-CM | POA: Insufficient documentation

## 2019-12-19 DIAGNOSIS — I6523 Occlusion and stenosis of bilateral carotid arteries: Secondary | ICD-10-CM | POA: Diagnosis not present

## 2019-12-24 DIAGNOSIS — E1129 Type 2 diabetes mellitus with other diabetic kidney complication: Secondary | ICD-10-CM | POA: Diagnosis not present

## 2019-12-24 DIAGNOSIS — Z79899 Other long term (current) drug therapy: Secondary | ICD-10-CM | POA: Diagnosis not present

## 2019-12-24 DIAGNOSIS — Z125 Encounter for screening for malignant neoplasm of prostate: Secondary | ICD-10-CM | POA: Diagnosis not present

## 2019-12-24 DIAGNOSIS — E785 Hyperlipidemia, unspecified: Secondary | ICD-10-CM | POA: Diagnosis not present

## 2019-12-24 DIAGNOSIS — I714 Abdominal aortic aneurysm, without rupture: Secondary | ICD-10-CM | POA: Diagnosis not present

## 2019-12-31 DIAGNOSIS — I2510 Atherosclerotic heart disease of native coronary artery without angina pectoris: Secondary | ICD-10-CM | POA: Diagnosis not present

## 2019-12-31 DIAGNOSIS — E1122 Type 2 diabetes mellitus with diabetic chronic kidney disease: Secondary | ICD-10-CM | POA: Diagnosis not present

## 2019-12-31 DIAGNOSIS — R7309 Other abnormal glucose: Secondary | ICD-10-CM | POA: Diagnosis not present

## 2019-12-31 DIAGNOSIS — E785 Hyperlipidemia, unspecified: Secondary | ICD-10-CM | POA: Diagnosis not present

## 2019-12-31 DIAGNOSIS — Z23 Encounter for immunization: Secondary | ICD-10-CM | POA: Diagnosis not present

## 2020-01-23 ENCOUNTER — Other Ambulatory Visit: Payer: Self-pay

## 2020-01-23 ENCOUNTER — Encounter: Payer: Self-pay | Admitting: Orthopaedic Surgery

## 2020-01-23 ENCOUNTER — Ambulatory Visit (INDEPENDENT_AMBULATORY_CARE_PROVIDER_SITE_OTHER): Payer: Medicare Other | Admitting: Orthopaedic Surgery

## 2020-01-23 VITALS — BP 108/62 | HR 69 | Ht 70.0 in | Wt 170.4 lb

## 2020-01-23 DIAGNOSIS — G8929 Other chronic pain: Secondary | ICD-10-CM

## 2020-01-23 DIAGNOSIS — M25511 Pain in right shoulder: Secondary | ICD-10-CM | POA: Diagnosis not present

## 2020-01-23 MED ORDER — HYDROCODONE-ACETAMINOPHEN 5-325 MG PO TABS: ORAL_TABLET | ORAL | 0 refills | Status: DC
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
## 2020-02-25 ENCOUNTER — Ambulatory Visit: Payer: Medicare HMO | Admitting: Orthopaedic Surgery

## 2020-03-03 ENCOUNTER — Telehealth: Payer: Self-pay | Admitting: Licensed Clinical Social Worker

## 2020-03-03 NOTE — Telephone Encounter (Signed)
CSW requested to contact patient to follow up on propane need.Patient states he used his credit and received a tank a while back. CSW informed patient that he may still have a credit and possibly has also received this years benefit for the LIEAP program. Patient states he will call the propane company and inquire about obtaining a tank and if any credit still remains. Patient will reach back out to CSW tomorrow with outcome and any further assistance. Lasandra Beech, LCSW, CCSW-MCS [phone]

## 2020-03-05 ENCOUNTER — Telehealth: Payer: Self-pay | Admitting: Licensed Clinical Social Worker

## 2020-03-05 NOTE — Telephone Encounter (Signed)
CSW contacted patient to follow up on propane request. Patient states that he received a full tank of propane yesterday. He was able to qualify for the LIEAP program and had added credit on his account. Patient denies any other concerns at this time. Lasandra Beech, LCSW, CCSW-MCS [phone]

## 2020-03-24 DIAGNOSIS — E1129 Type 2 diabetes mellitus with other diabetic kidney complication: Secondary | ICD-10-CM | POA: Diagnosis not present

## 2020-03-31 DIAGNOSIS — R2232 Localized swelling, mass and lump, left upper limb: Secondary | ICD-10-CM | POA: Diagnosis not present

## 2020-03-31 DIAGNOSIS — R7309 Other abnormal glucose: Secondary | ICD-10-CM | POA: Diagnosis not present

## 2020-03-31 DIAGNOSIS — Z89612 Acquired absence of left leg above knee: Secondary | ICD-10-CM | POA: Diagnosis not present

## 2020-03-31 DIAGNOSIS — I7100 Dissection of unspecified site of aorta: Secondary | ICD-10-CM | POA: Diagnosis not present

## 2020-03-31 DIAGNOSIS — E1122 Type 2 diabetes mellitus with diabetic chronic kidney disease: Secondary | ICD-10-CM | POA: Diagnosis not present

## 2020-04-07 ENCOUNTER — Other Ambulatory Visit: Payer: Self-pay | Admitting: Student

## 2020-04-16 ENCOUNTER — Emergency Department (HOSPITAL_COMMUNITY)
Admission: EM | Admit: 2020-04-16 | Discharge: 2020-04-16 | Disposition: A | Discharge status: Home / Self Care | Payer: Medicare Other | Attending: Emergency Medicine | Admitting: Emergency Medicine

## 2020-04-16 ENCOUNTER — Other Ambulatory Visit: Payer: Self-pay

## 2020-04-16 ENCOUNTER — Emergency Department (HOSPITAL_COMMUNITY): Payer: Medicare Other

## 2020-04-16 ENCOUNTER — Encounter (HOSPITAL_COMMUNITY): Payer: Self-pay | Admitting: Emergency Medicine

## 2020-04-16 DIAGNOSIS — E119 Type 2 diabetes mellitus without complications: Secondary | ICD-10-CM | POA: Diagnosis not present

## 2020-04-16 DIAGNOSIS — R6889 Other general symptoms and signs: Secondary | ICD-10-CM | POA: Diagnosis not present

## 2020-04-16 DIAGNOSIS — I2510 Atherosclerotic heart disease of native coronary artery without angina pectoris: Secondary | ICD-10-CM | POA: Insufficient documentation

## 2020-04-16 DIAGNOSIS — R110 Nausea: Secondary | ICD-10-CM | POA: Diagnosis not present

## 2020-04-16 DIAGNOSIS — Z743 Need for continuous supervision: Secondary | ICD-10-CM | POA: Diagnosis not present

## 2020-04-16 DIAGNOSIS — R1084 Generalized abdominal pain: Secondary | ICD-10-CM | POA: Diagnosis not present

## 2020-04-16 DIAGNOSIS — R109 Unspecified abdominal pain: Secondary | ICD-10-CM | POA: Diagnosis not present

## 2020-04-16 DIAGNOSIS — Z87891 Personal history of nicotine dependence: Secondary | ICD-10-CM | POA: Diagnosis not present

## 2020-04-16 DIAGNOSIS — R112 Nausea with vomiting, unspecified: Secondary | ICD-10-CM | POA: Diagnosis not present

## 2020-04-16 DIAGNOSIS — R1013 Epigastric pain: Secondary | ICD-10-CM | POA: Diagnosis present

## 2020-04-16 DIAGNOSIS — Z951 Presence of aortocoronary bypass graft: Secondary | ICD-10-CM | POA: Diagnosis not present

## 2020-04-16 DIAGNOSIS — R9431 Abnormal electrocardiogram [ECG] [EKG]: Secondary | ICD-10-CM | POA: Diagnosis not present

## 2020-04-16 DIAGNOSIS — R1110 Vomiting, unspecified: Secondary | ICD-10-CM | POA: Diagnosis not present

## 2020-04-16 DIAGNOSIS — R1111 Vomiting without nausea: Secondary | ICD-10-CM | POA: Diagnosis not present

## 2020-04-16 LAB — COMPREHENSIVE METABOLIC PANEL
ALT: 12 U/L (ref 0–44)
AST: 21 U/L (ref 15–41)
Albumin: 3.9 g/dL (ref 3.5–5.0)
Alkaline Phosphatase: 58 U/L (ref 38–126)
Anion gap: 12 (ref 5–15)
BUN: 12 mg/dL (ref 8–23)
CO2: 20 mmol/L — ABNORMAL LOW (ref 22–32)
Calcium: 8.9 mg/dL (ref 8.9–10.3)
Chloride: 103 mmol/L (ref 98–111)
Creatinine, Ser: 0.78 mg/dL (ref 0.61–1.24)
GFR, Estimated: >60 mL/min (ref >60)
Glucose, Bld: 156 mg/dL — ABNORMAL HIGH (ref 70–99)
Potassium: 3.8 mmol/L (ref 3.5–5.1)
Sodium: 135 mmol/L (ref 135–145)
Total Bilirubin: 0.8 mg/dL (ref 0.3–1.2)
Total Protein: 7.2 g/dL (ref 6.5–8.1)

## 2020-04-16 LAB — CBC WITH DIFFERENTIAL/PLATELET
Abs Immature Granulocytes: 0.07 10*3/uL (ref 0.00–0.07)
Basophils Absolute: 0 10*3/uL (ref 0.0–0.1)
Basophils Relative: 0 %
Eosinophils Absolute: 0 10*3/uL (ref 0.0–0.5)
Eosinophils Relative: 0 %
HCT: 45.3 % (ref 39.0–52.0)
Hemoglobin: 14.7 g/dL (ref 13.0–17.0)
Immature Granulocytes: 1 %
Lymphocytes Relative: 10 %
Lymphs Abs: 1.1 10*3/uL (ref 0.7–4.0)
MCH: 28.7 pg (ref 26.0–34.0)
MCHC: 32.5 g/dL (ref 30.0–36.0)
MCV: 88.5 fL (ref 80.0–100.0)
Monocytes Absolute: 0.6 10*3/uL (ref 0.1–1.0)
Monocytes Relative: 6 %
Neutro Abs: 8.7 10*3/uL — ABNORMAL HIGH (ref 1.7–7.7)
Neutrophils Relative %: 83 %
Platelets: 228 10*3/uL (ref 150–400)
RBC: 5.12 MIL/uL (ref 4.22–5.81)
RDW: 14.1 % (ref 11.5–15.5)
WBC: 10.5 10*3/uL (ref 4.0–10.5)
nRBC: 0 % (ref 0.0–0.2)

## 2020-04-16 LAB — URINALYSIS, ROUTINE W REFLEX MICROSCOPIC
Bilirubin Urine: NEGATIVE
Glucose, UA: 50 mg/dL — AB
Hgb urine dipstick: NEGATIVE
Ketones, ur: 80 mg/dL — AB
Leukocytes,Ua: NEGATIVE
Nitrite: NEGATIVE
Protein, ur: NEGATIVE mg/dL
Specific Gravity, Urine: >1.046 — ABNORMAL HIGH (ref 1.005–1.030)
pH: 5 (ref 5.0–8.0)

## 2020-04-16 LAB — LACTIC ACID, PLASMA: Lactic Acid, Venous: 1.3 mmol/L (ref 0.5–1.9)

## 2020-04-16 LAB — TROPONIN I (HIGH SENSITIVITY)
Troponin I (High Sensitivity): 6 ng/L (ref <18)
Troponin I (High Sensitivity): 6 ng/L (ref <18)

## 2020-04-16 LAB — LIPASE, BLOOD: Lipase: 33 U/L (ref 11–51)

## 2020-04-16 MED ORDER — IOHEXOL 350 MG/ML SOLN
100.0000 mL | Freq: Once | INTRAVENOUS | Status: AC | PRN
  Administered 2020-04-16: 100 mL via INTRAVENOUS

## 2020-04-16 MED ORDER — SODIUM CHLORIDE 0.9 % IV BOLUS
500.0000 mL | Freq: Once | INTRAVENOUS | Status: AC
  Administered 2020-04-16: 500 mL via INTRAVENOUS

## 2020-04-16 MED ORDER — MORPHINE SULFATE (PF) 4 MG/ML IV SOLN
4.0000 mg | Freq: Once | INTRAVENOUS | Status: AC
  Administered 2020-04-16: 4 mg via INTRAVENOUS
  Filled 2020-04-16: qty 1

## 2020-04-16 MED ORDER — DICYCLOMINE HCL 20 MG PO TABS: 20.0000 mg | ORAL_TABLET | Freq: Three times a day (TID) | ORAL | 0 refills | Status: DC | PRN

## 2020-04-16 MED ORDER — HYDROMORPHONE HCL 1 MG/ML IJ SOLN
0.5000 mg | Freq: Once | INTRAMUSCULAR | Status: AC
  Administered 2020-04-16: 0.5 mg via INTRAVENOUS
  Filled 2020-04-16: qty 1

## 2020-04-16 MED ORDER — PANTOPRAZOLE SODIUM 40 MG PO TBEC: 40.0000 mg | DELAYED_RELEASE_TABLET | Freq: Every day | ORAL | 0 refills | Status: DC

## 2020-04-16 MED ORDER — ONDANSETRON 4 MG PO TBDP: 4.0000 mg | ORAL_TABLET | Freq: Three times a day (TID) | ORAL | 0 refills | Status: DC | PRN

## 2020-04-16 MED ORDER — ONDANSETRON HCL 4 MG/2ML IJ SOLN
4.0000 mg | Freq: Once | INTRAMUSCULAR | Status: AC
  Administered 2020-04-16: 4 mg via INTRAVENOUS
  Filled 2020-04-16: qty 2
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
## 2020-04-16 NOTE — ED Triage Notes (Signed)
Pt woke up this morning with n/v and upper abdominal pain.

## 2020-04-16 NOTE — ED Notes (Signed)
Given water and ginger-ale. 

## 2020-04-16 NOTE — Discharge Instructions (Addendum)

## 2020-05-07 ENCOUNTER — Other Ambulatory Visit: Payer: Self-pay

## 2020-05-07 ENCOUNTER — Ambulatory Visit (INDEPENDENT_AMBULATORY_CARE_PROVIDER_SITE_OTHER): Payer: Medicare Other | Admitting: General Surgery

## 2020-05-07 ENCOUNTER — Encounter: Payer: Self-pay | Admitting: General Surgery

## 2020-05-07 VITALS — BP 95/70 | HR 80 | Temp 97.4°F | Resp 14 | Ht 70.0 in | Wt 156.0 lb

## 2020-05-07 DIAGNOSIS — L723 Sebaceous cyst: Secondary | ICD-10-CM

## 2020-05-07 NOTE — Patient Instructions (Signed)
Epidermoid Cyst  An epidermoid cyst, also called an epidermal cyst, is a small lump under your skin. The cyst contains a substance called keratin. Do not try to pop or open the cyst yourself. What are the causes?  A blocked hair follicle.  A hair that curls and re-enters the skin instead of growing straight out of the skin.  A blocked pore.  Irritated skin.  An injury to the skin.  Certain conditions that are passed along from parent to child.  Human papillomavirus (HPV). This happens rarely when cysts occur on the bottom of the feet.  Long-term sun damage to the skin. What increases the risk?  Having acne.  Being male.  Having an injury to the skin.  Being past puberty.  Having certain conditions caused by genes (genetic disorder) What are the signs or symptoms? These cysts are usually harmless, but they can get infected. Symptoms of infection may include:  Redness.  Inflammation.  Tenderness.  Warmth.  Fever.  A bad-smelling substance that drains from the cyst.  Pus that drains from the cyst. How is this treated? In many cases, epidermoid cysts go away on their own without treatment. If a cyst becomes infected, treatment may include:  Opening and draining the cyst, done by a doctor. After draining, you may need minor surgery to remove the rest of the cyst.  Antibiotic medicine.  Shots of medicines (steroids) that help to reduce inflammation.  Surgery to remove the cyst. Surgery may be done if the cyst: ? Becomes large. ? Bothers you. ? Has a chance of turning into cancer.  Do not try to open a cyst yourself. Follow these instructions at home: Medicines  Take over-the-counter and prescription medicines as told by your doctor.  If you were prescribed an antibiotic medicine, take it as told by your doctor. Do not stop taking it even if you start to feel better. General instructions  Keep the area around your cyst clean and dry.  Wear loose, dry  clothing.  Avoid touching your cyst.  Check your cyst every day for signs of infection. Check for: ? Redness, swelling, or pain. ? Fluid or blood. ? Warmth. ? Pus or a bad smell.  Keep all follow-up visits. How is this prevented?  Wear clean, dry, clothing.  Avoid wearing tight clothing.  Keep your skin clean and dry. Take showers or baths every day. Contact a doctor if:  Your cyst has symptoms of infection.  Your condition does not improve or gets worse.  You have a cyst that looks different from other cysts you have had.  You have a fever. Get help right away if:  Redness spreads from the cyst into the area close by. Summary  An epidermoid cyst is a small lump under your skin.  If a cyst becomes infected, treatment may include surgery to open and drain the cyst, or to remove it.  Take over-the-counter and prescription medicines only as told by your doctor.  Contact a doctor if your condition is not improving or is getting worse.  Keep all follow-up visits. This information is not intended to replace advice given to you by your health care provider. Make sure you discuss any questions you have with your health care provider. Document Revised: 04/10/2019 Document Reviewed: 04/10/2019 Elsevier Patient Education  2021 Elsevier Inc.  
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
## 2020-07-02 DIAGNOSIS — E1129 Type 2 diabetes mellitus with other diabetic kidney complication: Secondary | ICD-10-CM | POA: Diagnosis not present

## 2020-07-09 DIAGNOSIS — I350 Nonrheumatic aortic (valve) stenosis: Secondary | ICD-10-CM | POA: Diagnosis not present

## 2020-07-09 DIAGNOSIS — E1122 Type 2 diabetes mellitus with diabetic chronic kidney disease: Secondary | ICD-10-CM | POA: Diagnosis not present

## 2020-07-09 DIAGNOSIS — I25111 Atherosclerotic heart disease of native coronary artery with angina pectoris with documented spasm: Secondary | ICD-10-CM | POA: Diagnosis not present

## 2020-07-09 DIAGNOSIS — R7309 Other abnormal glucose: Secondary | ICD-10-CM | POA: Diagnosis not present

## 2020-07-13 DIAGNOSIS — I499 Cardiac arrhythmia, unspecified: Secondary | ICD-10-CM | POA: Diagnosis not present

## 2020-07-13 DIAGNOSIS — Z743 Need for continuous supervision: Secondary | ICD-10-CM | POA: Diagnosis not present

## 2020-07-13 DIAGNOSIS — R1111 Vomiting without nausea: Secondary | ICD-10-CM | POA: Diagnosis not present

## 2020-07-13 DIAGNOSIS — R1110 Vomiting, unspecified: Secondary | ICD-10-CM | POA: Diagnosis not present

## 2020-07-13 DIAGNOSIS — I252 Old myocardial infarction: Secondary | ICD-10-CM | POA: Diagnosis not present

## 2020-07-13 DIAGNOSIS — I10 Essential (primary) hypertension: Secondary | ICD-10-CM | POA: Diagnosis not present

## 2020-07-13 DIAGNOSIS — K449 Diaphragmatic hernia without obstruction or gangrene: Secondary | ICD-10-CM | POA: Diagnosis not present

## 2020-07-13 DIAGNOSIS — R6889 Other general symptoms and signs: Secondary | ICD-10-CM | POA: Diagnosis not present

## 2020-07-15 ENCOUNTER — Inpatient Hospital Stay (HOSPITAL_COMMUNITY)
Admission: EM | Admit: 2020-07-15 | Discharge: 2020-07-18 | DRG: 074 | Disposition: A | Discharge status: Home / Self Care | Payer: Medicare Other | Attending: Internal Medicine | Admitting: Internal Medicine

## 2020-07-15 ENCOUNTER — Emergency Department (HOSPITAL_COMMUNITY): Payer: Medicare Other

## 2020-07-15 ENCOUNTER — Encounter (HOSPITAL_COMMUNITY): Payer: Self-pay

## 2020-07-15 ENCOUNTER — Other Ambulatory Visit: Payer: Self-pay

## 2020-07-15 DIAGNOSIS — Z87891 Personal history of nicotine dependence: Secondary | ICD-10-CM | POA: Diagnosis not present

## 2020-07-15 DIAGNOSIS — Z952 Presence of prosthetic heart valve: Secondary | ICD-10-CM | POA: Diagnosis not present

## 2020-07-15 DIAGNOSIS — Z79899 Other long term (current) drug therapy: Secondary | ICD-10-CM

## 2020-07-15 DIAGNOSIS — I10 Essential (primary) hypertension: Secondary | ICD-10-CM | POA: Diagnosis not present

## 2020-07-15 DIAGNOSIS — Z7982 Long term (current) use of aspirin: Secondary | ICD-10-CM | POA: Diagnosis not present

## 2020-07-15 DIAGNOSIS — Z8679 Personal history of other diseases of the circulatory system: Secondary | ICD-10-CM

## 2020-07-15 DIAGNOSIS — E119 Type 2 diabetes mellitus without complications: Secondary | ICD-10-CM

## 2020-07-15 DIAGNOSIS — Z7984 Long term (current) use of oral hypoglycemic drugs: Secondary | ICD-10-CM

## 2020-07-15 DIAGNOSIS — Z89612 Acquired absence of left leg above knee: Secondary | ICD-10-CM | POA: Diagnosis not present

## 2020-07-15 DIAGNOSIS — K3184 Gastroparesis: Secondary | ICD-10-CM | POA: Diagnosis present

## 2020-07-15 DIAGNOSIS — F1290 Cannabis use, unspecified, uncomplicated: Secondary | ICD-10-CM | POA: Diagnosis present

## 2020-07-15 DIAGNOSIS — Z20822 Contact with and (suspected) exposure to covid-19: Secondary | ICD-10-CM | POA: Diagnosis present

## 2020-07-15 DIAGNOSIS — E1143 Type 2 diabetes mellitus with diabetic autonomic (poly)neuropathy: Secondary | ICD-10-CM | POA: Diagnosis not present

## 2020-07-15 DIAGNOSIS — Z951 Presence of aortocoronary bypass graft: Secondary | ICD-10-CM | POA: Diagnosis not present

## 2020-07-15 DIAGNOSIS — I700 Atherosclerosis of aorta: Secondary | ICD-10-CM | POA: Diagnosis not present

## 2020-07-15 DIAGNOSIS — E869 Volume depletion, unspecified: Secondary | ICD-10-CM | POA: Diagnosis not present

## 2020-07-15 DIAGNOSIS — Z8249 Family history of ischemic heart disease and other diseases of the circulatory system: Secondary | ICD-10-CM | POA: Diagnosis not present

## 2020-07-15 DIAGNOSIS — I2510 Atherosclerotic heart disease of native coronary artery without angina pectoris: Secondary | ICD-10-CM | POA: Diagnosis present

## 2020-07-15 DIAGNOSIS — I724 Aneurysm of artery of lower extremity: Secondary | ICD-10-CM | POA: Diagnosis present

## 2020-07-15 DIAGNOSIS — I252 Old myocardial infarction: Secondary | ICD-10-CM

## 2020-07-15 DIAGNOSIS — I350 Nonrheumatic aortic (valve) stenosis: Secondary | ICD-10-CM | POA: Diagnosis not present

## 2020-07-15 DIAGNOSIS — Z79891 Long term (current) use of opiate analgesic: Secondary | ICD-10-CM

## 2020-07-15 DIAGNOSIS — E871 Hypo-osmolality and hyponatremia: Secondary | ICD-10-CM | POA: Diagnosis present

## 2020-07-15 DIAGNOSIS — E785 Hyperlipidemia, unspecified: Secondary | ICD-10-CM | POA: Diagnosis present

## 2020-07-15 DIAGNOSIS — R9431 Abnormal electrocardiogram [ECG] [EKG]: Secondary | ICD-10-CM | POA: Diagnosis not present

## 2020-07-15 DIAGNOSIS — I714 Abdominal aortic aneurysm, without rupture, unspecified: Secondary | ICD-10-CM | POA: Diagnosis present

## 2020-07-15 DIAGNOSIS — R112 Nausea with vomiting, unspecified: Secondary | ICD-10-CM

## 2020-07-15 DIAGNOSIS — R109 Unspecified abdominal pain: Secondary | ICD-10-CM | POA: Diagnosis not present

## 2020-07-15 DIAGNOSIS — R1013 Epigastric pain: Secondary | ICD-10-CM | POA: Diagnosis not present

## 2020-07-15 DIAGNOSIS — F12288 Cannabis dependence with other cannabis-induced disorder: Secondary | ICD-10-CM

## 2020-07-15 DIAGNOSIS — R1110 Vomiting, unspecified: Secondary | ICD-10-CM | POA: Diagnosis present

## 2020-07-15 DIAGNOSIS — K449 Diaphragmatic hernia without obstruction or gangrene: Secondary | ICD-10-CM | POA: Diagnosis not present

## 2020-07-15 DIAGNOSIS — A059 Bacterial foodborne intoxication, unspecified: Secondary | ICD-10-CM | POA: Diagnosis not present

## 2020-07-15 LAB — COMPREHENSIVE METABOLIC PANEL
ALT: 16 U/L (ref 0–44)
AST: 22 U/L (ref 15–41)
Albumin: 3.8 g/dL (ref 3.5–5.0)
Alkaline Phosphatase: 60 U/L (ref 38–126)
Anion gap: 10 (ref 5–15)
BUN: 13 mg/dL (ref 8–23)
CO2: 22 mmol/L (ref 22–32)
Calcium: 8.4 mg/dL — ABNORMAL LOW (ref 8.9–10.3)
Chloride: 101 mmol/L (ref 98–111)
Creatinine, Ser: 0.71 mg/dL (ref 0.61–1.24)
GFR, Estimated: >60 mL/min (ref >60)
Glucose, Bld: 126 mg/dL — ABNORMAL HIGH (ref 70–99)
Potassium: 3.8 mmol/L (ref 3.5–5.1)
Sodium: 133 mmol/L — ABNORMAL LOW (ref 135–145)
Total Bilirubin: 0.7 mg/dL (ref 0.3–1.2)
Total Protein: 7.2 g/dL (ref 6.5–8.1)

## 2020-07-15 LAB — RAPID URINE DRUG SCREEN, HOSP PERFORMED
Amphetamines: NOT DETECTED
Barbiturates: NOT DETECTED
Benzodiazepines: NOT DETECTED
Cocaine: NOT DETECTED
Opiates: NOT DETECTED
Tetrahydrocannabinol: POSITIVE — AB

## 2020-07-15 LAB — CBC WITH DIFFERENTIAL/PLATELET
Abs Immature Granulocytes: 0.23 10*3/uL — ABNORMAL HIGH (ref 0.00–0.07)
Basophils Absolute: 0.1 10*3/uL (ref 0.0–0.1)
Basophils Relative: 0 %
Eosinophils Absolute: 0 10*3/uL (ref 0.0–0.5)
Eosinophils Relative: 0 %
HCT: 52.1 % — ABNORMAL HIGH (ref 39.0–52.0)
Hemoglobin: 17.8 g/dL — ABNORMAL HIGH (ref 13.0–17.0)
Immature Granulocytes: 1 %
Lymphocytes Relative: 9 %
Lymphs Abs: 2.2 10*3/uL (ref 0.7–4.0)
MCH: 29.6 pg (ref 26.0–34.0)
MCHC: 34.2 g/dL (ref 30.0–36.0)
MCV: 86.7 fL (ref 80.0–100.0)
Monocytes Absolute: 2.6 10*3/uL — ABNORMAL HIGH (ref 0.1–1.0)
Monocytes Relative: 11 %
Neutro Abs: 19.1 10*3/uL — ABNORMAL HIGH (ref 1.7–7.7)
Neutrophils Relative %: 79 %
Platelets: 245 10*3/uL (ref 150–400)
RBC: 6.01 MIL/uL — ABNORMAL HIGH (ref 4.22–5.81)
RDW: 14.7 % (ref 11.5–15.5)
WBC: 24.1 10*3/uL — ABNORMAL HIGH (ref 4.0–10.5)
nRBC: 0 % (ref 0.0–0.2)

## 2020-07-15 LAB — TROPONIN I (HIGH SENSITIVITY)
Troponin I (High Sensitivity): 11 ng/L (ref <18)
Troponin I (High Sensitivity): 12 ng/L (ref <18)

## 2020-07-15 LAB — URINALYSIS, ROUTINE W REFLEX MICROSCOPIC
Bacteria, UA: NONE SEEN
Bilirubin Urine: NEGATIVE
Glucose, UA: NEGATIVE mg/dL
Hgb urine dipstick: NEGATIVE
Ketones, ur: 20 mg/dL — AB
Leukocytes,Ua: NEGATIVE
Nitrite: NEGATIVE
Protein, ur: 30 mg/dL — AB
Specific Gravity, Urine: 1.034 — ABNORMAL HIGH (ref 1.005–1.030)
pH: 7 (ref 5.0–8.0)

## 2020-07-15 LAB — MAGNESIUM: Magnesium: 2.1 mg/dL (ref 1.7–2.4)

## 2020-07-15 LAB — RESP PANEL BY RT-PCR (FLU A&B, COVID) ARPGX2
Influenza A by PCR: NEGATIVE
Influenza B by PCR: NEGATIVE
SARS Coronavirus 2 by RT PCR: NEGATIVE

## 2020-07-15 LAB — LIPASE, BLOOD: Lipase: 43 U/L (ref 11–51)

## 2020-07-15 MED ORDER — METOCLOPRAMIDE HCL 5 MG/ML IJ SOLN
10.0000 mg | Freq: Once | INTRAMUSCULAR | Status: AC
  Administered 2020-07-15: 10 mg via INTRAVENOUS
  Filled 2020-07-15: qty 2

## 2020-07-15 MED ORDER — ONDANSETRON HCL 4 MG PO TABS
4.0000 mg | ORAL_TABLET | Freq: Four times a day (QID) | ORAL | Status: DC | PRN
  Filled 2020-07-15: qty 1

## 2020-07-15 MED ORDER — ONDANSETRON HCL 4 MG/2ML IJ SOLN
4.0000 mg | Freq: Once | INTRAMUSCULAR | Status: AC
  Administered 2020-07-15: 4 mg via INTRAVENOUS
  Filled 2020-07-15: qty 2

## 2020-07-15 MED ORDER — POLYETHYLENE GLYCOL 3350 17 G PO PACK: 17.0000 g | PACK | Freq: Every day | ORAL | Status: DC | PRN

## 2020-07-15 MED ORDER — PANTOPRAZOLE SODIUM 40 MG IV SOLR
40.0000 mg | INTRAVENOUS | Status: DC
  Administered 2020-07-15: 40 mg via INTRAVENOUS
  Filled 2020-07-15: qty 40

## 2020-07-15 MED ORDER — HYDROMORPHONE HCL 1 MG/ML IJ SOLN
1.0000 mg | Freq: Once | INTRAMUSCULAR | Status: AC
  Administered 2020-07-15: 1 mg via INTRAVENOUS
  Filled 2020-07-15: qty 1

## 2020-07-15 MED ORDER — IOHEXOL 300 MG/ML  SOLN
100.0000 mL | Freq: Once | INTRAMUSCULAR | Status: AC | PRN
  Administered 2020-07-15: 100 mL via INTRAVENOUS

## 2020-07-15 MED ORDER — POTASSIUM CHLORIDE IN NACL 20-0.9 MEQ/L-% IV SOLN: INTRAVENOUS | Status: DC

## 2020-07-15 MED ORDER — SODIUM CHLORIDE 0.9 % IV BOLUS
1000.0000 mL | Freq: Once | INTRAVENOUS | Status: AC
  Administered 2020-07-15: 1000 mL via INTRAVENOUS

## 2020-07-15 MED ORDER — ONDANSETRON HCL 4 MG/2ML IJ SOLN
4.0000 mg | Freq: Four times a day (QID) | INTRAMUSCULAR | Status: DC | PRN
  Filled 2020-07-15: qty 2

## 2020-07-15 MED ORDER — ACETAMINOPHEN 325 MG PO TABS
650.0000 mg | ORAL_TABLET | Freq: Four times a day (QID) | ORAL | Status: DC | PRN
  Filled 2020-07-15: qty 2

## 2020-07-15 MED ORDER — INSULIN ASPART 100 UNIT/ML IJ SOLN
0.0000 [IU] | Freq: Three times a day (TID) | INTRAMUSCULAR | Status: DC
  Administered 2020-07-15: 1 [IU] via SUBCUTANEOUS

## 2020-07-15 MED ORDER — ACETAMINOPHEN 650 MG RE SUPP: 650.0000 mg | Freq: Four times a day (QID) | RECTAL | Status: DC | PRN

## 2020-07-15 MED ORDER — PIPERACILLIN-TAZOBACTAM 3.375 G IVPB
3.3750 g | Freq: Three times a day (TID) | INTRAVENOUS | Status: DC
  Administered 2020-07-15: 3.375 g via INTRAVENOUS
  Filled 2020-07-15: qty 50

## 2020-07-15 MED ORDER — ENOXAPARIN SODIUM 40 MG/0.4ML IJ SOSY
40.0000 mg | PREFILLED_SYRINGE | INTRAMUSCULAR | Status: DC
  Administered 2020-07-15 – 2020-07-17 (×3): 40 mg via SUBCUTANEOUS
  Filled 2020-07-15 (×4): qty 0.4

## 2020-07-15 MED ORDER — BOOST / RESOURCE BREEZE PO LIQD CUSTOM
1.0000 | Freq: Three times a day (TID) | ORAL | Status: DC
  Administered 2020-07-16 – 2020-07-17 (×3): 1 via ORAL
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
## 2020-07-15 NOTE — ED Notes (Signed)
Pts. O2 dropped down to 85 on RA. Placed 2 L of O2 on pt. Pts. O2 is now 99.

## 2020-07-15 NOTE — ED Triage Notes (Signed)
Pt presents to ED with complaints of emesis and generalized abdominal pain since Monday. Pt states he has not been able to eat or drink since Monday.

## 2020-07-15 NOTE — Progress Notes (Signed)
Pharmacy Antibiotic Note  [name] is a 70 y.o. male admitted on 07/15/2020 with  intra-abdominal infection .  Pharmacy has been consulted for zosyn dosing.  Plan: Zosyn 3.375g IV q8h (4 hour infusion). F/u cxs and clinical progress Monitor V/S, labs  Height: 5\' 10"  (177.8 cm) Weight: 68 kg (150 lb) IBW/kg (Calculated) : 73  Temp (24hrs), Avg:98.7 F (37.1 C), Min:98.7 F (37.1 C), Max:98.7 F (37.1 C)  Recent Labs  Lab 07/15/20 1017 07/15/20 1203  WBC 24.1*  --   CREATININE  --  0.71    Estimated Creatinine Clearance: 83.8 mL/min (by C-G formula based on SCr of 0.71 mg/dL).    No Known Allergies  Antimicrobials this admission: Zosyn  6/29 >>   Microbiology results: 6/29 BCx: pending  Thank you for allowing pharmacy to be a part of this patient's care.  7/29, BS Pharm D, [facility] Clinical Pharmacist Pager [phone] 07/15/2020 2:52 PM

## 2020-07-15 NOTE — ED Notes (Signed)
This nurse and the charge nurse have made attempts to establish an IV without success.
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
## 2020-07-15 NOTE — ED Notes (Signed)
Patient transported to CT 

## 2020-07-15 NOTE — ED Notes (Signed)
Pt informed that he may not have anything to drink until we have CT results.

## 2020-07-16 DIAGNOSIS — Z79891 Long term (current) use of opiate analgesic: Secondary | ICD-10-CM | POA: Diagnosis not present

## 2020-07-16 DIAGNOSIS — Z8249 Family history of ischemic heart disease and other diseases of the circulatory system: Secondary | ICD-10-CM | POA: Diagnosis not present

## 2020-07-16 DIAGNOSIS — I724 Aneurysm of artery of lower extremity: Secondary | ICD-10-CM | POA: Diagnosis not present

## 2020-07-16 DIAGNOSIS — A059 Bacterial foodborne intoxication, unspecified: Secondary | ICD-10-CM | POA: Diagnosis not present

## 2020-07-16 DIAGNOSIS — Z87891 Personal history of nicotine dependence: Secondary | ICD-10-CM | POA: Diagnosis not present

## 2020-07-16 DIAGNOSIS — I2510 Atherosclerotic heart disease of native coronary artery without angina pectoris: Secondary | ICD-10-CM

## 2020-07-16 DIAGNOSIS — K3184 Gastroparesis: Secondary | ICD-10-CM | POA: Diagnosis present

## 2020-07-16 DIAGNOSIS — Z7984 Long term (current) use of oral hypoglycemic drugs: Secondary | ICD-10-CM | POA: Diagnosis not present

## 2020-07-16 DIAGNOSIS — I252 Old myocardial infarction: Secondary | ICD-10-CM | POA: Diagnosis not present

## 2020-07-16 DIAGNOSIS — R112 Nausea with vomiting, unspecified: Secondary | ICD-10-CM | POA: Diagnosis not present

## 2020-07-16 DIAGNOSIS — E785 Hyperlipidemia, unspecified: Secondary | ICD-10-CM | POA: Diagnosis not present

## 2020-07-16 DIAGNOSIS — I350 Nonrheumatic aortic (valve) stenosis: Secondary | ICD-10-CM | POA: Diagnosis not present

## 2020-07-16 DIAGNOSIS — Z8679 Personal history of other diseases of the circulatory system: Secondary | ICD-10-CM | POA: Diagnosis not present

## 2020-07-16 DIAGNOSIS — F12288 Cannabis dependence with other cannabis-induced disorder: Secondary | ICD-10-CM | POA: Diagnosis not present

## 2020-07-16 DIAGNOSIS — Z79899 Other long term (current) drug therapy: Secondary | ICD-10-CM | POA: Diagnosis not present

## 2020-07-16 DIAGNOSIS — Z20822 Contact with and (suspected) exposure to covid-19: Secondary | ICD-10-CM | POA: Diagnosis not present

## 2020-07-16 DIAGNOSIS — Z89612 Acquired absence of left leg above knee: Secondary | ICD-10-CM | POA: Diagnosis not present

## 2020-07-16 DIAGNOSIS — I700 Atherosclerosis of aorta: Secondary | ICD-10-CM | POA: Diagnosis not present

## 2020-07-16 DIAGNOSIS — Z952 Presence of prosthetic heart valve: Secondary | ICD-10-CM | POA: Diagnosis not present

## 2020-07-16 DIAGNOSIS — Z7982 Long term (current) use of aspirin: Secondary | ICD-10-CM | POA: Diagnosis not present

## 2020-07-16 DIAGNOSIS — E1143 Type 2 diabetes mellitus with diabetic autonomic (poly)neuropathy: Secondary | ICD-10-CM | POA: Diagnosis not present

## 2020-07-16 DIAGNOSIS — E871 Hypo-osmolality and hyponatremia: Secondary | ICD-10-CM | POA: Diagnosis not present

## 2020-07-16 DIAGNOSIS — I10 Essential (primary) hypertension: Secondary | ICD-10-CM | POA: Diagnosis not present

## 2020-07-16 DIAGNOSIS — F1290 Cannabis use, unspecified, uncomplicated: Secondary | ICD-10-CM | POA: Diagnosis present

## 2020-07-16 DIAGNOSIS — R1110 Vomiting, unspecified: Secondary | ICD-10-CM | POA: Diagnosis present

## 2020-07-16 DIAGNOSIS — E869 Volume depletion, unspecified: Secondary | ICD-10-CM | POA: Diagnosis present

## 2020-07-16 DIAGNOSIS — Z951 Presence of aortocoronary bypass graft: Secondary | ICD-10-CM | POA: Diagnosis not present

## 2020-07-16 LAB — BASIC METABOLIC PANEL
Anion gap: 7 (ref 5–15)
BUN: 14 mg/dL (ref 8–23)
CO2: 24 mmol/L (ref 22–32)
Calcium: 8.2 mg/dL — ABNORMAL LOW (ref 8.9–10.3)
Chloride: 102 mmol/L (ref 98–111)
Creatinine, Ser: 0.84 mg/dL (ref 0.61–1.24)
GFR, Estimated: >60 mL/min (ref >60)
Glucose, Bld: 120 mg/dL — ABNORMAL HIGH (ref 70–99)
Potassium: 3.9 mmol/L (ref 3.5–5.1)
Sodium: 133 mmol/L — ABNORMAL LOW (ref 135–145)

## 2020-07-16 LAB — GLUCOSE, CAPILLARY
Glucose-Capillary: 97 mg/dL (ref 70–99)
Glucose-Capillary: 98 mg/dL (ref 70–99)
Glucose-Capillary: 130 mg/dL — ABNORMAL HIGH (ref 70–99)
Glucose-Capillary: 134 mg/dL — ABNORMAL HIGH (ref 70–99)

## 2020-07-16 LAB — CBC
HCT: 45.3 % (ref 39.0–52.0)
Hemoglobin: 14.8 g/dL (ref 13.0–17.0)
MCH: 29.1 pg (ref 26.0–34.0)
MCHC: 32.7 g/dL (ref 30.0–36.0)
MCV: 89.2 fL (ref 80.0–100.0)
Platelets: 207 10*3/uL (ref 150–400)
RBC: 5.08 MIL/uL (ref 4.22–5.81)
RDW: 14.5 % (ref 11.5–15.5)
WBC: 17.7 10*3/uL — ABNORMAL HIGH (ref 4.0–10.5)
nRBC: 0 % (ref 0.0–0.2)

## 2020-07-16 LAB — HIV ANTIBODY (ROUTINE TESTING W REFLEX): HIV Screen 4th Generation wRfx: NONREACTIVE

## 2020-07-16 MED ORDER — INSULIN ASPART 100 UNIT/ML IJ SOLN
0.0000 [IU] | Freq: Three times a day (TID) | INTRAMUSCULAR | Status: DC
  Administered 2020-07-16 – 2020-07-17 (×3): 1 [IU] via SUBCUTANEOUS
  Administered 2020-07-17: 2 [IU] via SUBCUTANEOUS

## 2020-07-16 MED ORDER — PANTOPRAZOLE SODIUM 40 MG IV SOLR
40.0000 mg | Freq: Two times a day (BID) | INTRAVENOUS | Status: DC
  Administered 2020-07-16 – 2020-07-18 (×5): 40 mg via INTRAVENOUS
  Filled 2020-07-16 (×6): qty 40

## 2020-07-16 MED ORDER — ONDANSETRON HCL 4 MG/2ML IJ SOLN
4.0000 mg | Freq: Four times a day (QID) | INTRAMUSCULAR | Status: DC
  Administered 2020-07-16 – 2020-07-18 (×9): 4 mg via INTRAVENOUS
  Filled 2020-07-16 (×8): qty 2

## 2020-07-16 MED ORDER — INSULIN ASPART 100 UNIT/ML IJ SOLN: 0.0000 [IU] | Freq: Every day | INTRAMUSCULAR | Status: DC

## 2020-07-16 NOTE — Progress Notes (Signed)
PROGRESS NOTE  [name] HBZ:[id number] DOB: [birth year] DOA: 07/15/2020 PCP: Carylon Perches, MD  Brief History:  70 year old male with a history of coronary artery disease status post CABG, hyperlipidemia, aortic stenosis status postrepair with pericardial tissue valve, left AKA, and AAA status post repair 09/06/2018 presenting with nausea, vomiting, and abdominal pain that began on 07/13/2020.  The patient ate a tomato and mayonnaise sandwich on 07/12/2020.  On the morning of 07/13/2020 he began having the above symptoms.  He went to the emergency department in [place].  The patient was treated symptomatically and was discharged home in stable condition.  Unfortunately, his nausea and vomiting returned on 07/14/2020.  She denied any fevers, chills, chest pain, shortness breath, hemoptysis, headache, rashes, synovitis.  The patient did have some loose stools without hematochezia or melena.  His last BM was on 07/13/2020.  He denies any recent travels or eating any other exotic or undercooked foods. Notably, the patient states that he smokes cannabis 3 times per week.  He has done so for at least the last 30 years. In the emergency department, the patient was afebrile hemodynamically stable with oxygen saturation 100% room air.  BMP showed a sodium 133 and serum creatinine 0.4.  Potassium 3.9.  WBC 24.1, hemoglobin 17.8, platelets 207,000.  UA was negative for pyuria.  CTA abdomen and pelvis was negative for acute mesenteric ischemia.  It showed hyperdense material in the gallbladder without any inflammatory changes.  There was a partially fecalized distal small bowel without any obstructive or inflammatory changes.  The patient was started on IV fluids and antiemetics.  Assessment/Plan: Intractable nausea, vomiting -Multifactorial including cannabis hyperemesis syndrome, diabetic gastroparesis, and possible infectious process -Patient remains nauseous, intolerant of p.o. intake  presently -Continue clear liquid diet for now -Start Zofran around-the-clock -Continue PPI--increased to twice daily -Continue IV fluids  Diabetes mellitus type 2 -NovoLog sliding scale -Hemoglobin A1c -Holding metformin  Essential hypertension -Holding metoprolol temporarily -Holding lisinopril temporarily  Hyperlipidemia -Holding statin until able to tolerate p.o.  Status post aortic valve replacement -Edwards pericardial tissue valve placed 02/06/2017 -Outpatient cardiology follow-up  AAA status post repair -Status post repair 09/06/2018  Coronary artery disease -No chest pain presently -BMS to the circumflex 2000 and  Cannabis Use -cessation discussed  Hyponatremia -due to volume depletion -continue IVF     Status is: Observation  The patient will require care spanning > 2 midnights and should be moved to inpatient because: IV treatments appropriate due to intensity of illness or inability to take PO  Dispo: The patient is from: Home              Anticipated d/c is to: Home              Patient currently is not medically stable to d/c.   Difficult to place patient No        Family Communication:  no Family at bedside  Consultants:  none  Code Status:  FULL   DVT Prophylaxis:  [place] Lovenox   Procedures: As Listed in Progress Note Above  Antibiotics: None       Subjective: Patient feels nausea, but states emesis better.  Abd pain is improving.  Wants to try liquids.  Denies f/c, cp, sob, hemoptysis, melena, dysuria, hematuria, hematochezia  Objective: Vitals:   07/15/20 1647 07/15/20 1925 07/15/20 2000 07/16/20 0251  BP: (!) 168/81 (!) 141/98 130/89 111/71  Pulse: 66 94 77 97  Resp: 20 19 20 19   Temp: 98.8 F (37.1 C) 98 F (36.7 C) 98 F (36.7 C) 98.3 F (36.8 C)  TempSrc: Oral  Oral   SpO2: 100% 97% 98% 96%  Weight:      Height:        Intake/Output Summary (Last 24 hours) at 07/16/2020 0715 Last data filed at 07/16/2020  0235 Gross per 24 hour  Intake 1818.25 ml  Output 300 ml  Net 1518.25 ml   Weight change:  Exam:  General:  Pt is alert, follows commands appropriately, not in acute distress HEENT: No icterus, No thrush, No neck mass, [place]/AT Cardiovascular: RRR, S1/S2, no rubs, no gallops Respiratory: diminished BS but CTA Abdomen: Soft/+BS, non tender, non distended, no guarding Extremities: No edema, No lymphangitis, No petechiae, No rashes, no synovitis; L-AKA   Data Reviewed: I have personally reviewed following labs and imaging studies Basic Metabolic Panel: Recent Labs  Lab 07/15/20 1203 07/15/20 1631 07/16/20 0431  NA 133*  --  133*  K 3.8  --  3.9  CL 101  --  102  CO2 22  --  24  GLUCOSE 126*  --  120*  BUN 13  --  14  CREATININE 0.71  --  0.84  CALCIUM 8.4*  --  8.2*  MG  --  2.1  --    Liver Function Tests: Recent Labs  Lab 07/15/20 1203  AST 22  ALT 16  ALKPHOS 60  BILITOT 0.7  PROT 7.2  ALBUMIN 3.8   Recent Labs  Lab 07/15/20 1203  LIPASE 43   No results for input(s): AMMONIA in the last 168 hours. Coagulation Profile: No results for input(s): INR, PROTIME in the last 168 hours. CBC: Recent Labs  Lab 07/15/20 1017 07/16/20 0431  WBC 24.1* 17.7*  NEUTROABS 19.1*  --   HGB 17.8* 14.8  HCT 52.1* 45.3  MCV 86.7 89.2  PLT 245 207   Cardiac Enzymes: No results for input(s): CKTOTAL, CKMB, CKMBINDEX, TROPONINI in the last 168 hours. BNP: Invalid input(s): POCBNP CBG: Recent Labs  Lab 07/16/20 0540  GLUCAP 97   HbA1C: No results for input(s): HGBA1C in the last 72 hours. Urine analysis:    Component Value Date/Time   COLORURINE YELLOW 07/15/2020 1423   APPEARANCEUR CLEAR 07/15/2020 1423   LABSPEC 1.034 (H) 07/15/2020 1423   PHURINE 7.0 07/15/2020 1423   GLUCOSEU NEGATIVE 07/15/2020 1423   GLUCOSEU neg 02/03/2009 1250   HGBUR NEGATIVE 07/15/2020 1423   BILIRUBINUR NEGATIVE 07/15/2020 1423   KETONESUR 20 (A) 07/15/2020 1423   PROTEINUR 30  (A) 07/15/2020 1423   NITRITE NEGATIVE 07/15/2020 1423   LEUKOCYTESUR NEGATIVE 07/15/2020 1423   Sepsis Labs: @LABRCNTIP (procalcitonin:4,lacticidven:4) ) Recent Results (from the past 240 hour(s))  Resp Panel by RT-PCR (Flu A&B, Covid) Nasopharyngeal Swab     Status: None   Collection Time: 07/15/20 11:46 AM   Specimen: Nasopharyngeal Swab; Nasopharyngeal(NP) swabs in vial transport medium  Result Value Ref Range Status   SARS Coronavirus 2 by RT PCR NEGATIVE NEGATIVE Final    Comment: (NOTE) SARS-CoV-2 target nucleic acids are NOT DETECTED.  The SARS-CoV-2 RNA is generally detectable in upper respiratory specimens during the acute phase of infection. The lowest concentration of SARS-CoV-2 viral copies this assay can detect is 138 copies/mL. A negative result does not preclude SARS-Cov-2 infection and should not be used as the sole basis for treatment or other patient management decisions. A negative result may occur with  improper specimen  collection/handling, submission of specimen other than nasopharyngeal swab, presence of viral mutation(s) within the areas targeted by this assay, and inadequate number of viral copies(<138 copies/mL). A negative result must be combined with clinical observations, patient history, and epidemiological information. The expected result is Negative.  Fact Sheet for Patients:  BloggerCourse.com  Fact Sheet for Healthcare Providers:  SeriousBroker.it  This test is no t yet approved or cleared by the [place] FDA and  has been authorized for detection and/or diagnosis of SARS-CoV-2 by FDA under an Emergency Use Authorization (EUA). This EUA will remain  in effect (meaning this test can be used) for the duration of the COVID-19 declaration under Section 564(b)(1) of the Act, 21 U.S.C.section 360bbb-3(b)(1), unless the authorization is terminated  or revoked sooner.       Influenza A by PCR  NEGATIVE NEGATIVE Final   Influenza B by PCR NEGATIVE NEGATIVE Final    Comment: (NOTE) The Xpert Xpress SARS-CoV-2/FLU/RSV plus assay is intended as an aid in the diagnosis of influenza from Nasopharyngeal swab specimens and should not be used as a sole basis for treatment. Nasal washings and aspirates are unacceptable for Xpert Xpress SARS-CoV-2/FLU/RSV testing.  Fact Sheet for Patients: BloggerCourse.com  Fact Sheet for Healthcare Providers: SeriousBroker.it  This test is not yet approved or cleared by the [place] FDA and has been authorized for detection and/or diagnosis of SARS-CoV-2 by FDA under an Emergency Use Authorization (EUA). This EUA will remain in effect (meaning this test can be used) for the duration of the COVID-19 declaration under Section 564(b)(1) of the Act, 21 U.S.C. section 360bbb-3(b)(1), unless the authorization is terminated or revoked.  Performed at [facility], [address], [place], [place] [address]   Blood culture (routine x 2)     Status: None (Preliminary result)   Collection Time: 07/15/20  2:58 PM   Specimen: BLOOD LEFT ARM  Result Value Ref Range Status   Specimen Description BLOOD LEFT ARM BOTTLES DRAWN AEROBIC AND ANAEROBIC  Final   Special Requests   Final    Blood Culture adequate volume Performed at [facility], [address], [place], [place] [address]    Culture PENDING  Incomplete   Report Status PENDING  Incomplete  Blood culture (routine x 2)     Status: None (Preliminary result)   Collection Time: 07/15/20  3:25 PM   Specimen: Right Antecubital; Blood  Result Value Ref Range Status   Specimen Description   Final    RIGHT ANTECUBITAL BOTTLES DRAWN AEROBIC AND ANAEROBIC   Special Requests   Final    Blood Culture adequate volume Performed at [facility], [address], [place], [place] [address]    Culture PENDING  Incomplete   Report Status PENDING  Incomplete      Scheduled Meds:  enoxaparin (LOVENOX) injection  40 mg Subcutaneous Q24H   feeding supplement  1 Container Oral TID BM   insulin aspart  0-9 Units Subcutaneous Q8H   pantoprazole (PROTONIX) IV  40 mg Intravenous Q24H   Continuous Infusions:  0.9 % NaCl with KCl 20 mEq / L 100 mL/hr at 07/16/20 0235    Procedures/Studies: DG Abd Portable 2V  Result Date: 07/15/2020 CLINICAL DATA:  Abdominal pain and vomiting EXAM: PORTABLE ABDOMEN - 2 VIEW COMPARISON:  04/16/2020 FINDINGS: Chronic basilar interstitial changes. Previous median sternotomy for aortic valve replacement. Normal heart size. No pleural effusion. Negative free air. Scattered air and stool throughout the bowel without obstruction pattern or ileus. Bifurcated aortoiliac  stent graft noted. Degenerative changes of the spine. IMPRESSION: No acute finding by plain radiography. Negative for obstruction or free air. Electronically Signed   By: Judie Petit.  Shick M.D.   On: 07/15/2020 11:55   CT Angio Abd/Pel W and/or Wo Contrast  Result Date: 07/15/2020 CLINICAL DATA:  70 year old male with a history of acute abrupt onset of abdominal pain EXAM: CTA ABDOMEN AND PELVIS WITHOUT AND WITH CONTRAST TECHNIQUE: Multidetector CT imaging of the abdomen and pelvis was performed using the standard protocol during bolus administration of intravenous contrast. Multiplanar reconstructed images and MIPs were obtained and reviewed to evaluate the vascular anatomy. CONTRAST:  OMNIPAQUE IOHEXOL 300 MG/ML  SOLN COMPARISON:  04/16/2020 FINDINGS: VASCULAR Aorta: Distal thoracic aorta with no significant atherosclerotic plaque. Diameter at the hiatus measures 2.4 cm. Redemonstration endovascular repair of infrarenal abdominal aortic aneurysm with infrarenal fixation, delivered from the right. Maximum diameter of the excluded aneurysm sac estimated 4.6 cm, which is essentially unchanged from the comparison. No evidence type 1 or type 2 endoleak on the delayed images. No  evidence migration of the stent graft. Celiac: Mixed calcified and soft plaque at the origin of the celiac artery with 50% narrowing estimated. Branches remain patent. SMA: Atherosclerotic plaque at the origin of the SMA. No evidence of high-grade stenosis at the SMA origin on the sagittal images. Branches remain patent. Renals: - Right: Single right renal artery. No significant atherosclerotic changes at the origin. Pre hilar branch to the upper pole. Normal course caliber and contour. - Left: Single left renal artery. No significant atherosclerotic changes at the origin. Normal course caliber and contour with no pre hilar branches. IMA: IMA has been excluded. There appears to be collateral flow of the IMA beyond the origin. Right lower extremity: The right iliac limb terminates in the common iliac artery. The limb remains patent without significant plaque or endo luminal thrombus. Mild atherosclerotic plaque of the common iliac artery. 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 Mild atherosclerotic changes of the external iliac artery. Common femoral artery patent with associated postsurgical changes. No evidence of pseudoaneurysm of the right common femoral artery. Proximal profunda femoris and SFA patent. Left lower extremity: The left iliac limb terminates within the proximal external iliac artery. There is similar degree of mild circumferential endoluminal thrombus without significant narrowing. Hypogastric artery remains patent. External iliac artery patent with no significant atherosclerotic changes. Redemonstration of pseudoaneurysm/aneurysm of the common femoral artery with the greatest diameter 16 mm. No evidence of significant change in size or configuration. No inflammatory changes or Peri arterial fluid. Redemonstration of patent profunda femoris and lateral circumflex femoral artery. Native SFA remains occluded. Veins: Unremarkable appearance of the venous system. Review of the MIP images  confirms the above findings. NON-VASCULAR Lower chest: Hiatal hernia.  No acute finding of the lower chest. Hepatobiliary: Unremarkable appearance of the liver. Hyperdense material within the gallbladder, potentially microlithiasis or vicarious excretion of contrast. No inflammatory changes. No intrahepatic biliary ductal dilatation. Pancreas: Unremarkable. Spleen: Unremarkable. Adrenals/Urinary Tract: - Right adrenal gland: Unchanged appearance of right adrenal gland. - Left adrenal gland: Unchanged appearance of left adrenal gland. - Right kidney: No hydronephrosis, nephrolithiasis, inflammation, or ureteral dilation. No focal lesion. - Left Kidney: No hydronephrosis, nephrolithiasis, inflammation, or ureteral dilation. No focal lesion. - Urinary Bladder: Urinary bladder somewhat distended with no inflammatory changes or stones within the lumen. Stomach/Bowel: - Stomach: Hiatal hernia.  Otherwise unremarkable stomach. - Small bowel: No abnormal distension. No focal inflammatory changes. No air-fluid levels.  Partially fecalized distal small bowel with no evidence of obstruction. - Appendix: Normal. - Colon: Unremarkable colon with no evidence of obstruction. No inflammatory changes. Diverticular disease present as previous. Lymphatic: No adenopathy. Mesenteric: No free fluid or air. No mesenteric adenopathy. Reproductive: Transverse diameter of the prostate estimated 3.1 cm. Internal calcifications. Other: Small fat containing inguinal hernia bilaterally. Small fat containing umbilical hernia. Musculoskeletal: No acute displaced fracture. No bony canal narrowing. IMPRESSION: CT angiogram is negative for evidence of acute mesenteric ischemia. No acute CT finding to account for patient's abdominal pain. Partial fecalization of distal small bowel above the terminal ileum without evidence of obstruction or inflammatory changes. This is favored to represent slow transit, sometimes seen in the setting of chronic pain  medication use. Aortic atherosclerosis is present, as well as atherosclerotic changes at the mesenteric artery origins, estimated 50% at the celiac artery origin and no significant narrowing at the SMA origin. The IMA has been excluded by the prior EVAR repair. Aortic Atherosclerosis (ICD10-I70.0). Redemonstration of EVAR with no late complicating features as above. Redemonstration of left femoral artery pseudoaneurysm, relatively unchanged over time with no evidence of significant enlargement or inflammation. Additional ancillary findings as above. Signed, Yvone Neu. Reyne Dumas, RPVI [facility] Radiology Electronically Signed   By: Gilmer Mor D.O.   On: 07/15/2020 13:33    Catarina Hartshorn, DO  [facility]  If 7PM-7AM, please contact night-coverage [email or web address] Password [email or web address] 07/16/2020, 7:15 AM   LOS: 0 days

## 2020-07-17 LAB — BASIC METABOLIC PANEL
Anion gap: 9 (ref 5–15)
BUN: 13 mg/dL (ref 8–23)
CO2: 23 mmol/L (ref 22–32)
Calcium: 8.5 mg/dL — ABNORMAL LOW (ref 8.9–10.3)
Chloride: 102 mmol/L (ref 98–111)
Creatinine, Ser: 0.75 mg/dL (ref 0.61–1.24)
GFR, Estimated: >60 mL/min (ref >60)
Glucose, Bld: 93 mg/dL (ref 70–99)
Potassium: 4.5 mmol/L (ref 3.5–5.1)
Sodium: 134 mmol/L — ABNORMAL LOW (ref 135–145)

## 2020-07-17 LAB — CBC
HCT: 51.2 % (ref 39.0–52.0)
Hemoglobin: 16.5 g/dL (ref 13.0–17.0)
MCH: 28.6 pg (ref 26.0–34.0)
MCHC: 32.2 g/dL (ref 30.0–36.0)
MCV: 88.9 fL (ref 80.0–100.0)
Platelets: 182 10*3/uL (ref 150–400)
RBC: 5.76 MIL/uL (ref 4.22–5.81)
RDW: 14.2 % (ref 11.5–15.5)
WBC: 13.6 10*3/uL — ABNORMAL HIGH (ref 4.0–10.5)
nRBC: 0 % (ref 0.0–0.2)

## 2020-07-17 LAB — GLUCOSE, CAPILLARY
Glucose-Capillary: 105 mg/dL — ABNORMAL HIGH (ref 70–99)
Glucose-Capillary: 101 mg/dL — ABNORMAL HIGH (ref 70–99)
Glucose-Capillary: 158 mg/dL — ABNORMAL HIGH (ref 70–99)
Glucose-Capillary: 127 mg/dL — ABNORMAL HIGH (ref 70–99)
Glucose-Capillary: 116 mg/dL — ABNORMAL HIGH (ref 70–99)

## 2020-07-17 LAB — TROPONIN I (HIGH SENSITIVITY)
Troponin I (High Sensitivity): 8 ng/L (ref <18)
Troponin I (High Sensitivity): 7 ng/L (ref <18)

## 2020-07-17 LAB — HEMOGLOBIN A1C
Hgb A1c MFr Bld: 6 % — ABNORMAL HIGH (ref 4.8–5.6)
Mean Plasma Glucose: 126 mg/dL

## 2020-07-17 LAB — MAGNESIUM: Magnesium: 2.1 mg/dL (ref 1.7–2.4)

## 2020-07-17 MED ORDER — METOPROLOL TARTRATE 25 MG PO TABS
12.5000 mg | ORAL_TABLET | Freq: Two times a day (BID) | ORAL | Status: DC
  Administered 2020-07-17 – 2020-07-18 (×2): 12.5 mg via ORAL
  Filled 2020-07-17 (×3): qty 1

## 2020-07-17 MED ORDER — ALUM & MAG HYDROXIDE-SIMETH 200-200-20 MG/5ML PO SUSP
30.0000 mL | Freq: Once | ORAL | Status: AC
  Administered 2020-07-17: 30 mL via ORAL
  Filled 2020-07-17: qty 30

## 2020-07-17 MED ORDER — ATORVASTATIN CALCIUM 20 MG PO TABS
20.0000 mg | ORAL_TABLET | Freq: Every day | ORAL | Status: DC
  Administered 2020-07-17 – 2020-07-18 (×2): 20 mg via ORAL
  Filled 2020-07-17 (×2): qty 1

## 2020-07-17 MED ORDER — SODIUM CHLORIDE 0.9 % IV SOLN: INTRAVENOUS | Status: DC

## 2020-07-17 NOTE — Care Management Important Message (Signed)
Important Message  Patient Details  Name: [name] MRN: [id number] Date of Birth: 07/06/1950   Medicare Important Message Given:  Yes     Corey Harold 07/17/2020, 1:17 PM

## 2020-07-17 NOTE — Progress Notes (Signed)
PROGRESS NOTE  [name] TGG:[id number] DOB: [birth year] DOA: 07/15/2020 PCP: Carylon Perches, MD   Brief History:  70 year old male with a history of coronary artery disease status post CABG, hyperlipidemia, aortic stenosis status postrepair with pericardial tissue valve, left AKA, and AAA status post repair 09/06/2018 presenting with nausea, vomiting, and abdominal pain that began on 07/13/2020.  The patient ate a tomato and mayonnaise sandwich on 07/12/2020.  On the morning of 07/13/2020 he began having the above symptoms.  He went to the emergency department in [place].  The patient was treated symptomatically and was discharged home in stable condition.  Unfortunately, his nausea and vomiting returned on 07/14/2020.  She denied any fevers, chills, chest pain, shortness breath, hemoptysis, headache, rashes, synovitis.  The patient did have some loose stools without hematochezia or melena.  His last BM was on 07/13/2020.  He denies any recent travels or eating any other exotic or undercooked foods. Notably, the patient states that he smokes cannabis 3 times per week.  He has done so for at least the last 30 years. In the emergency department, the patient was afebrile hemodynamically stable with oxygen saturation 100% room air.  BMP showed a sodium 133 and serum creatinine 0.4.  Potassium 3.9.  WBC 24.1, hemoglobin 17.8, platelets 207,000.  UA was negative for pyuria.  CTA abdomen and pelvis was negative for acute mesenteric ischemia.  It showed hyperdense material in the gallbladder without any inflammatory changes.  There was a partially fecalized distal small bowel without any obstructive or inflammatory changes.  The patient was started on IV fluids and antiemetics.   Assessment/Plan: Intractable nausea, vomiting -Multifactorial including cannabis hyperemesis syndrome, diabetic gastroparesis, and possible infectious process -Patient remains nauseous, intolerant of p.o. intake  presently -Continue clear liquid diet for now -Start Zofran around-the-clock -Continue PPI--increased to twice daily -Continue IV fluids -advance to soft diet 7/1  Atypical Chest pain -troponin 8 -personally reviewed EKG--Sinus, nonspecific TWI   Diabetes mellitus type 2 -NovoLog sliding scale -Hemoglobin A1c--6.0 -Holding metformin   Essential hypertension -Holding metoprolol temporarily>>restart 7/1 -Holding lisinopril temporarily   Hyperlipidemia -Holding statin until able to tolerate p.o.>>restart 7/1   Status post aortic valve replacement -Edwards pericardial tissue valve placed 02/06/2017 -Outpatient cardiology follow-up   AAA status post repair -Status post repair 09/06/2018   Coronary artery disease -No chest pain presently -BMS to the circumflex 2000 and   Cannabis Use -cessation discussed   Hyponatremia -due to volume depletion -continue IVF         Status is: Observation   The patient will require care spanning > 2 midnights and should be moved to inpatient because: IV treatments appropriate due to intensity of illness or inability to take PO   Dispo: The patient is from: Home              Anticipated d/c is to: Home              Patient currently is not medically stable to d/c.              Difficult to place patient No               Family Communication:  no Family at bedside   Consultants:  none   Code Status:  FULL   DVT Prophylaxis:  Carmi Lovenox     Procedures: As Listed in Progress Note Above   Antibiotics: None   Subjective: Patient feels  intermittent nausea.  Wants to try real food.  Denies f/c, sob, v/d/abd pain, dysuria  Objective: Vitals:   07/17/20 0258 07/17/20 1400 07/17/20 1433 07/17/20 1555  BP: (!) 145/98 (!) 164/105 (!) 162/115 (!) 144/93  Pulse: (!) 56 84 73   Resp: 19 16    Temp: 98.4 F (36.9 C) 97.6 F (36.4 C)    TempSrc:  Oral    SpO2: 94% 98%  100%  Weight:      Height:        Intake/Output  Summary (Last 24 hours) at 07/17/2020 1736 Last data filed at 07/17/2020 1025 Gross per 24 hour  Intake 2490.85 ml  Output 850 ml  Net 1640.85 ml   Weight change:  Exam:  General:  Pt is alert, follows commands appropriately, not in acute distress HEENT: No icterus, No thrush, No neck mass, [place]/AT Cardiovascular: RRR, S1/S2, no rubs, no gallops Respiratory: bibasilar crackles. No wheeze Abdomen: Soft/+BS, non tender, non distended, no guarding Extremities: No edema, No lymphangitis, No petechiae, No rashes, no synovitis   Data Reviewed: I have personally reviewed following labs and imaging studies Basic Metabolic Panel: Recent Labs  Lab 07/15/20 1203 07/15/20 1631 07/16/20 0431 07/17/20 0606  NA 133*  --  133* 134*  K 3.8  --  3.9 4.5  CL 101  --  102 102  CO2 22  --  24 23  GLUCOSE 126*  --  120* 93  BUN 13  --  14 13  CREATININE 0.71  --  0.84 0.75  CALCIUM 8.4*  --  8.2* 8.5*  MG  --  2.1  --  2.1   Liver Function Tests: Recent Labs  Lab 07/15/20 1203  AST 22  ALT 16  ALKPHOS 60  BILITOT 0.7  PROT 7.2  ALBUMIN 3.8   Recent Labs  Lab 07/15/20 1203  LIPASE 43   No results for input(s): AMMONIA in the last 168 hours. Coagulation Profile: No results for input(s): INR, PROTIME in the last 168 hours. CBC: Recent Labs  Lab 07/15/20 1017 07/16/20 0431 07/17/20 0606  WBC 24.1* 17.7* 13.6*  NEUTROABS 19.1*  --   --   HGB 17.8* 14.8 16.5  HCT 52.1* 45.3 51.2  MCV 86.7 89.2 88.9  PLT 245 207 182   Cardiac Enzymes: No results for input(s): CKTOTAL, CKMB, CKMBINDEX, TROPONINI in the last 168 hours. BNP: Invalid input(s): POCBNP CBG: Recent Labs  Lab 07/16/20 1615 07/16/20 1959 07/17/20 0725 07/17/20 1117 07/17/20 1604  GLUCAP 134* 105* 101* 158* 127*   HbA1C: Recent Labs    07/15/20 1631  HGBA1C 6.0*   Urine analysis:    Component Value Date/Time   COLORURINE YELLOW 07/15/2020 1423   APPEARANCEUR CLEAR 07/15/2020 1423   LABSPEC 1.034 (H)  07/15/2020 1423   PHURINE 7.0 07/15/2020 1423   GLUCOSEU NEGATIVE 07/15/2020 1423   GLUCOSEU neg 02/03/2009 1250   HGBUR NEGATIVE 07/15/2020 1423   BILIRUBINUR NEGATIVE 07/15/2020 1423   KETONESUR 20 (A) 07/15/2020 1423   PROTEINUR 30 (A) 07/15/2020 1423   NITRITE NEGATIVE 07/15/2020 1423   LEUKOCYTESUR NEGATIVE 07/15/2020 1423   Sepsis Labs: @LABRCNTIP (procalcitonin:4,lacticidven:4) ) Recent Results (from the past 240 hour(s))  Resp Panel by RT-PCR (Flu A&B, Covid) Nasopharyngeal Swab     Status: None   Collection Time: 07/15/20 11:46 AM   Specimen: Nasopharyngeal Swab; Nasopharyngeal(NP) swabs in vial transport medium  Result Value Ref Range Status   SARS Coronavirus 2 by RT PCR NEGATIVE NEGATIVE Final  Comment: (NOTE) SARS-CoV-2 target nucleic acids are NOT DETECTED.  The SARS-CoV-2 RNA is generally detectable in upper respiratory specimens during the acute phase of infection. The lowest concentration of SARS-CoV-2 viral copies this assay can detect is 138 copies/mL. A negative result does not preclude SARS-Cov-2 infection and should not be used as the sole basis for treatment or other patient management decisions. A negative result may occur with  improper specimen collection/handling, submission of specimen other than nasopharyngeal swab, presence of viral mutation(s) within the areas targeted by this assay, and inadequate number of viral copies(<138 copies/mL). A negative result must be combined with clinical observations, patient history, and epidemiological information. The expected result is Negative.  Fact Sheet for Patients:  BloggerCourse.comhttps://www.fda.gov/media/152166/download  Fact Sheet for Healthcare Providers:  SeriousBroker.ithttps://www.fda.gov/media/152162/download  This test is no t yet approved or cleared by the [place] FDA and  has been authorized for detection and/or diagnosis of SARS-CoV-2 by FDA under an Emergency Use Authorization (EUA). This EUA will remain  in  effect (meaning this test can be used) for the duration of the COVID-19 declaration under Section 564(b)(1) of the Act, 21 U.S.C.section 360bbb-3(b)(1), unless the authorization is terminated  or revoked sooner.       Influenza A by PCR NEGATIVE NEGATIVE Final   Influenza B by PCR NEGATIVE NEGATIVE Final    Comment: (NOTE) The Xpert Xpress SARS-CoV-2/FLU/RSV plus assay is intended as an aid in the diagnosis of influenza from Nasopharyngeal swab specimens and should not be used as a sole basis for treatment. Nasal washings and aspirates are unacceptable for Xpert Xpress SARS-CoV-2/FLU/RSV testing.  Fact Sheet for Patients: BloggerCourse.comhttps://www.fda.gov/media/152166/download  Fact Sheet for Healthcare Providers: SeriousBroker.ithttps://www.fda.gov/media/152162/download  This test is not yet approved or cleared by the [place] FDA and has been authorized for detection and/or diagnosis of SARS-CoV-2 by FDA under an Emergency Use Authorization (EUA). This EUA will remain in effect (meaning this test can be used) for the duration of the COVID-19 declaration under Section 564(b)(1) of the Act, 21 U.S.C. section 360bbb-3(b)(1), unless the authorization is terminated or revoked.  Performed at [facility], [address] [place], [place] [address]   Blood culture (routine x 2)     Status: None (Preliminary result)   Collection Time: 07/15/20  2:58 PM   Specimen: BLOOD LEFT ARM  Result Value Ref Range Status   Specimen Description BLOOD LEFT ARM BOTTLES DRAWN AEROBIC AND ANAEROBIC  Final   Special Requests Blood Culture adequate volume  Final   Culture   Final    NO GROWTH 2 DAYS Performed at [facility], [address] [place], [place] [address]    Report Status PENDING  Incomplete  Blood culture (routine x 2)     Status: None (Preliminary result)   Collection Time: 07/15/20  3:25 PM   Specimen: Right Antecubital; Blood  Result Value Ref Range Status   Specimen Description   Final    RIGHT  ANTECUBITAL BOTTLES DRAWN AEROBIC AND ANAEROBIC   Special Requests Blood Culture adequate volume  Final   Culture   Final    NO GROWTH 2 DAYS Performed at [facility], [address] [place], [place] [address]    Report Status PENDING  Incomplete     Scheduled Meds:  enoxaparin (LOVENOX) injection  40 mg Subcutaneous Q24H   feeding supplement  1 Container Oral TID BM   insulin aspart  0-5 Units Subcutaneous QHS   insulin aspart  0-9 Units Subcutaneous TID WC   metoprolol  tartrate  12.5 mg Oral BID   ondansetron (ZOFRAN) IV  4 mg Intravenous Q6H   pantoprazole (PROTONIX) IV  40 mg Intravenous Q12H   Continuous Infusions:  sodium chloride 125 mL/hr at 07/17/20 1625    Procedures/Studies: DG Abd Portable 2V  Result Date: 07/15/2020 CLINICAL DATA:  Abdominal pain and vomiting EXAM: PORTABLE ABDOMEN - 2 VIEW COMPARISON:  04/16/2020 FINDINGS: Chronic basilar interstitial changes. Previous median sternotomy for aortic valve replacement. Normal heart size. No pleural effusion. Negative free air. Scattered air and stool throughout the bowel without obstruction pattern or ileus. Bifurcated aortoiliac stent graft noted. Degenerative changes of the spine. IMPRESSION: No acute finding by plain radiography. Negative for obstruction or free air. Electronically Signed   By: Judie Petit.  Shick M.D.   On: 07/15/2020 11:55   CT Angio Abd/Pel W and/or Wo Contrast  Result Date: 07/15/2020 CLINICAL DATA:  70 year old male with a history of acute abrupt onset of abdominal pain EXAM: CTA ABDOMEN AND PELVIS WITHOUT AND WITH CONTRAST TECHNIQUE: Multidetector CT imaging of the abdomen and pelvis was performed using the standard protocol during bolus administration of intravenous contrast. Multiplanar reconstructed images and MIPs were obtained and reviewed to evaluate the vascular anatomy. CONTRAST:  OMNIPAQUE IOHEXOL 300 MG/ML  SOLN COMPARISON:  04/16/2020 FINDINGS: VASCULAR Aorta: Distal thoracic aorta with no  significant atherosclerotic plaque. Diameter at the hiatus measures 2.4 cm. Redemonstration endovascular repair of infrarenal abdominal aortic aneurysm with infrarenal fixation, delivered from the right. Maximum diameter of the excluded aneurysm sac estimated 4.6 cm, which is essentially unchanged from the comparison. No evidence type 1 or type 2 endoleak on the delayed images. No evidence migration of the stent graft. Celiac: Mixed calcified and soft plaque at the origin of the celiac artery with 50% narrowing estimated. Branches remain patent. SMA: Atherosclerotic plaque at the origin of the SMA. No evidence of high-grade stenosis at the SMA origin on the sagittal images. Branches remain patent. Renals: - Right: Single right renal artery. No significant atherosclerotic changes at the origin. Pre hilar branch to the upper pole. Normal course caliber and contour. - Left: Single left renal artery. No significant atherosclerotic changes at the origin. Normal course caliber and contour with no pre hilar branches. IMA: IMA has been excluded. There appears to be collateral flow of the IMA beyond the origin. Right lower extremity: The right iliac limb terminates in the common iliac artery. The limb remains patent without significant plaque or endo luminal thrombus. Mild atherosclerotic plaque of the common iliac artery. 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 Mild atherosclerotic changes of the external iliac artery. Common femoral artery patent with associated postsurgical changes. No evidence of pseudoaneurysm of the right common femoral artery. Proximal profunda femoris and SFA patent. Left lower extremity: The left iliac limb terminates within the proximal external iliac artery. There is similar degree of mild circumferential endoluminal thrombus without significant narrowing. Hypogastric artery remains patent. External iliac artery patent with no significant atherosclerotic changes. Redemonstration of  pseudoaneurysm/aneurysm of the common femoral artery with the greatest diameter 16 mm. No evidence of significant change in size or configuration. No inflammatory changes or Peri arterial fluid. Redemonstration of patent profunda femoris and lateral circumflex femoral artery. Native SFA remains occluded. Veins: Unremarkable appearance of the venous system. Review of the MIP images confirms the above findings. NON-VASCULAR Lower chest: Hiatal hernia.  No acute finding of the lower chest. Hepatobiliary: Unremarkable appearance of the liver. Hyperdense material within the gallbladder, potentially microlithiasis or vicarious  excretion of contrast. No inflammatory changes. No intrahepatic biliary ductal dilatation. Pancreas: Unremarkable. Spleen: Unremarkable. Adrenals/Urinary Tract: - Right adrenal gland: Unchanged appearance of right adrenal gland. - Left adrenal gland: Unchanged appearance of left adrenal gland. - Right kidney: No hydronephrosis, nephrolithiasis, inflammation, or ureteral dilation. No focal lesion. - Left Kidney: No hydronephrosis, nephrolithiasis, inflammation, or ureteral dilation. No focal lesion. - Urinary Bladder: Urinary bladder somewhat distended with no inflammatory changes or stones within the lumen. Stomach/Bowel: - Stomach: Hiatal hernia.  Otherwise unremarkable stomach. - Small bowel: No abnormal distension. No focal inflammatory changes. No air-fluid levels. Partially fecalized distal small bowel with no evidence of obstruction. - Appendix: Normal. - Colon: Unremarkable colon with no evidence of obstruction. No inflammatory changes. Diverticular disease present as previous. Lymphatic: No adenopathy. Mesenteric: No free fluid or air. No mesenteric adenopathy. Reproductive: Transverse diameter of the prostate estimated 3.1 cm. Internal calcifications. Other: Small fat containing inguinal hernia bilaterally. Small fat containing umbilical hernia. Musculoskeletal: No acute displaced fracture.  No bony canal narrowing. IMPRESSION: CT angiogram is negative for evidence of acute mesenteric ischemia. No acute CT finding to account for patient's abdominal pain. Partial fecalization of distal small bowel above the terminal ileum without evidence of obstruction or inflammatory changes. This is favored to represent slow transit, sometimes seen in the setting of chronic pain medication use. Aortic atherosclerosis is present, as well as atherosclerotic changes at the mesenteric artery origins, estimated 50% at the celiac artery origin and no significant narrowing at the SMA origin. The IMA has been excluded by the prior EVAR repair. Aortic Atherosclerosis (ICD10-I70.0). Redemonstration of EVAR with no late complicating features as above. Redemonstration of left femoral artery pseudoaneurysm, relatively unchanged over time with no evidence of significant enlargement or inflammation. Additional ancillary findings as above. Signed, Yvone Neu. Reyne Dumas, RPVI [facility] Radiology Electronically Signed   By: Gilmer Mor D.O.   On: 07/15/2020 13:33    Catarina Hartshorn, DO  [facility]  If 7PM-7AM, please contact night-coverage [email or web address] Password [email or web address] 07/17/2020, 5:36 PM   LOS: 1 day

## 2020-07-18 DIAGNOSIS — Z952 Presence of prosthetic heart valve: Secondary | ICD-10-CM

## 2020-07-18 LAB — CBC
HCT: 50.2 % (ref 39.0–52.0)
Hemoglobin: 16.3 g/dL (ref 13.0–17.0)
MCH: 29.1 pg (ref 26.0–34.0)
MCHC: 32.5 g/dL (ref 30.0–36.0)
MCV: 89.5 fL (ref 80.0–100.0)
Platelets: 189 10*3/uL (ref 150–400)
RBC: 5.61 MIL/uL (ref 4.22–5.81)
RDW: 14.1 % (ref 11.5–15.5)
WBC: 12.1 10*3/uL — ABNORMAL HIGH (ref 4.0–10.5)
nRBC: 0 % (ref 0.0–0.2)

## 2020-07-18 LAB — BASIC METABOLIC PANEL
Anion gap: 10 (ref 5–15)
BUN: 13 mg/dL (ref 8–23)
CO2: 24 mmol/L (ref 22–32)
Calcium: 8.3 mg/dL — ABNORMAL LOW (ref 8.9–10.3)
Chloride: 103 mmol/L (ref 98–111)
Creatinine, Ser: 0.78 mg/dL (ref 0.61–1.24)
GFR, Estimated: >60 mL/min (ref >60)
Glucose, Bld: 80 mg/dL (ref 70–99)
Potassium: 4 mmol/L (ref 3.5–5.1)
Sodium: 137 mmol/L (ref 135–145)

## 2020-07-18 LAB — GLUCOSE, CAPILLARY
Glucose-Capillary: 86 mg/dL (ref 70–99)
Glucose-Capillary: 85 mg/dL (ref 70–99)

## 2020-07-18 LAB — MAGNESIUM: Magnesium: 2.3 mg/dL (ref 1.7–2.4)

## 2020-07-18 NOTE — Discharge Summary (Signed)
Physician Discharge Summary  Sean SinclairWillie O [name] ZHY:[id number]:[id number] DOB: [birth year] DOA: 07/15/2020  PCP: [name]  Admit date: 07/15/2020 Discharge date: 07/18/2020  Admitted From: Home Disposition:  Home   Recommendations for Outpatient Follow-up:  Follow up with PCP in 1-2 weeks Please obtain BMP/CBC in one week     Discharge Condition: Stable CODE STATUS:FULL Diet recommendation: Heart Healthy   Brief/Interim Summary: 70 year old male with a history of coronary artery disease status post CABG, hyperlipidemia, aortic stenosis status postrepair with pericardial tissue [name], left AKA, and AAA status post repair 09/06/2018 presenting with nausea, vomiting, and abdominal pain that began on 07/13/2020.  The patient ate a tomato and mayonnaise sandwich on 07/12/2020.  On the morning of 07/13/2020 he began having the above symptoms.  He went to the emergency department in [place].  The patient was treated symptomatically and was discharged home in stable condition.  Unfortunately, his nausea and vomiting returned on 07/14/2020.  She denied any fevers, chills, chest pain, shortness breath, hemoptysis, headache, rashes, synovitis.  The patient did have some loose stools without hematochezia or melena.  His last BM was on 07/13/2020.  He denies any recent travels or eating any other exotic or undercooked foods. Notably, the patient states that he smokes cannabis 3 times per week.  He has done so for at least the last 30 years. In the emergency department, the patient was afebrile hemodynamically stable with oxygen saturation 100% room air.  BMP showed a sodium 133 and serum creatinine 0.4.  Potassium 3.9.  WBC 24.1, hemoglobin 17.8, platelets 207,000.  UA was negative for pyuria.  CTA abdomen and pelvis was negative for acute mesenteric ischemia.  It showed hyperdense material in the gallbladder without any inflammatory changes.  There was a partially fecalized distal small bowel without any obstructive  or inflammatory changes.  The patient was started on IV fluids and antiemetics.  Discharge Diagnoses:  Intractable nausea, vomiting -Multifactorial including cannabis hyperemesis syndrome, diabetic gastroparesis -Patient remains nauseous, intolerant of p.o. intake presently -Continue clear liquid diet for now -Start Zofran around-the-clock -Continue PPI--increased to twice daily -Continue IV fluids -advance to soft diet 7/1--pt tolerating diet    Atypical Chest pain -troponin 8>>7 -personally reviewed EKG--Sinus, nonspecific TWI   Diabetes mellitus type 2 -NovoLog sliding scale -Hemoglobin A1c--6.0 -Holding metformin>>restart after d/c   Essential hypertension -Holding metoprolol temporarily>>restart 7/1 -Holding lisinopril temporarily>>restart after d/c   Hyperlipidemia -Holding statin until able to tolerate p.o.>>restart 7/1   Status post aortic [name] replacement -[name] placed 02/06/2017 -Outpatient cardiology follow-up   AAA status post repair -Status post repair 09/06/2018   Coronary artery disease -No chest pain presently -BMS to the circumflex 2000 and   Cannabis Use -cessation discussed   Hyponatremia -due to volume depletion -continue IVF   Discharge Instructions   Allergies as of 07/18/2020   No Known Allergies      Medication List     STOP taking these medications    ondansetron 4 MG disintegrating tablet Commonly known as: Zofran ODT   sucralfate 1 GM/10ML suspension Commonly known as: CARAFATE       TAKE these medications    aspirin EC 81 MG tablet Take 81 mg by mouth daily.   dicyclomine 20 MG tablet Commonly known as: BENTYL Take 1 tablet (20 mg total) by mouth 3 (three) times daily as needed for spasms.   HYDROcodone-acetaminophen 5-325 MG tablet Commonly known as: NORCO/VICODIN One tablet every four hours for pain.   linagliptin  5 MG Tabs tablet Commonly known as: TRADJENTA Take 5 mg by mouth  daily.   lisinopril 5 MG tablet Commonly known as: ZESTRIL TAKE 1 TABLET(5 MG) BY MOUTH DAILY   metFORMIN 500 MG tablet Commonly known as: GLUCOPHAGE Take 1 tablet by mouth 2 (two) times daily.   metoprolol tartrate 25 MG tablet Commonly known as: LOPRESSOR Take 0.5 tablets (12.5 mg total) by mouth 2 (two) times daily.   pantoprazole 40 MG tablet Commonly known as: PROTONIX Take 1 tablet (40 mg total) by mouth daily.   simvastatin 40 MG tablet Commonly known as: ZOCOR Take 40 mg by mouth at bedtime.   tamsulosin 0.4 MG Caps capsule Commonly known as: FLOMAX Take 0.4 mg by mouth daily.   [name] FlexTouch 100 UNIT/ML FlexTouch Pen Generic drug: insulin degludec Inject 20 Units into the skin every morning.        No Known Allergies  Consultations: none   Procedures/Studies: DG Abd Portable 2V  Result Date: 07/15/2020 CLINICAL DATA:  Abdominal pain and vomiting EXAM: PORTABLE ABDOMEN - 2 VIEW COMPARISON:  04/16/2020 FINDINGS: Chronic basilar interstitial changes. Previous median sternotomy for aortic [name] replacement. Normal heart size. No pleural effusion. Negative free air. Scattered air and stool throughout the bowel without obstruction pattern or ileus. Bifurcated aortoiliac stent graft noted. Degenerative changes of the spine. IMPRESSION: No acute finding by plain radiography. Negative for obstruction or free air. Electronically Signed   By: Sean Petit.  Shick M.D.   On: 07/15/2020 11:55   CT Angio Abd/Pel W and/or Wo Contrast  Result Date: 07/15/2020 CLINICAL DATA:  70 year old male with a history of acute abrupt onset of abdominal pain EXAM: CTA ABDOMEN AND PELVIS WITHOUT AND WITH CONTRAST TECHNIQUE: Multidetector CT imaging of the abdomen and pelvis was performed using the standard protocol during bolus administration of intravenous contrast. Multiplanar reconstructed images and MIPs were obtained and reviewed to evaluate the vascular anatomy. CONTRAST:  OMNIPAQUE  IOHEXOL 300 MG/ML  SOLN COMPARISON:  04/16/2020 FINDINGS: VASCULAR Aorta: Distal thoracic aorta with no significant atherosclerotic plaque. Diameter at the hiatus measures 2.4 cm. Redemonstration endovascular repair of infrarenal abdominal aortic aneurysm with infrarenal fixation, delivered from the right. Maximum diameter of the excluded aneurysm sac estimated 4.6 cm, which is essentially unchanged from the comparison. No evidence type 1 or type 2 endoleak on the delayed images. No evidence migration of the stent graft. Celiac: Mixed calcified and soft plaque at the origin of the celiac artery with 50% narrowing estimated. Branches remain patent. SMA: Atherosclerotic plaque at the origin of the SMA. No evidence of high-grade stenosis at the SMA origin on the sagittal images. Branches remain patent. Renals: - Right: Single right renal artery. No significant atherosclerotic changes at the origin. Pre hilar branch to the upper pole. Normal course caliber and contour. - Left: Single left renal artery. No significant atherosclerotic changes at the origin. Normal course caliber and contour with no pre hilar branches. IMA: IMA has been excluded. There appears to be collateral flow of the IMA beyond the origin. Right lower extremity: The right iliac limb terminates in the common iliac artery. The limb remains patent without significant plaque or endo luminal thrombus. Mild atherosclerotic plaque of the common iliac artery. 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 Mild atherosclerotic changes of the external iliac artery. Common femoral artery patent with associated postsurgical changes. No evidence of pseudoaneurysm of the right common femoral artery. Proximal profunda femoris and SFA patent. Left lower extremity: The left iliac limb  terminates within the proximal external iliac artery. There is similar degree of mild circumferential endoluminal thrombus without significant narrowing. Hypogastric artery remains  patent. External iliac artery patent with no significant atherosclerotic changes. Redemonstration of pseudoaneurysm/aneurysm of the common femoral artery with the greatest diameter 16 mm. No evidence of significant change in size or configuration. No inflammatory changes or Peri arterial fluid. Redemonstration of patent profunda femoris and lateral circumflex femoral artery. Native SFA remains occluded. Veins: Unremarkable appearance of the venous system. Review of the MIP images confirms the above findings. NON-VASCULAR Lower chest: Hiatal hernia.  No acute finding of the lower chest. Hepatobiliary: Unremarkable appearance of the liver. Hyperdense material within the gallbladder, potentially microlithiasis or vicarious excretion of contrast. No inflammatory changes. No intrahepatic biliary ductal dilatation. Pancreas: Unremarkable. Spleen: Unremarkable. Adrenals/Urinary Tract: - Right adrenal gland: Unchanged appearance of right adrenal gland. - Left adrenal gland: Unchanged appearance of left adrenal gland. - Right kidney: No hydronephrosis, nephrolithiasis, inflammation, or ureteral dilation. No focal lesion. - Left Kidney: No hydronephrosis, nephrolithiasis, inflammation, or ureteral dilation. No focal lesion. - Urinary Bladder: Urinary bladder somewhat distended with no inflammatory changes or stones within the lumen. Stomach/Bowel: - Stomach: Hiatal hernia.  Otherwise unremarkable stomach. - Small bowel: No abnormal distension. No focal inflammatory changes. No air-fluid levels. Partially fecalized distal small bowel with no evidence of obstruction. - Appendix: Normal. - Colon: Unremarkable colon with no evidence of obstruction. No inflammatory changes. Diverticular disease present as previous. Lymphatic: No adenopathy. Mesenteric: No free fluid or air. No mesenteric adenopathy. Reproductive: Transverse diameter of the prostate estimated 3.1 cm. Internal calcifications. Other: Small fat containing inguinal  hernia bilaterally. Small fat containing umbilical hernia. Musculoskeletal: No acute displaced fracture. No bony canal narrowing. IMPRESSION: CT angiogram is negative for evidence of acute mesenteric ischemia. No acute CT finding to account for patient's abdominal pain. Partial fecalization of distal small bowel above the terminal ileum without evidence of obstruction or inflammatory changes. This is favored to represent slow transit, sometimes seen in the setting of chronic pain medication use. Aortic atherosclerosis is present, as well as atherosclerotic changes at the mesenteric artery origins, estimated 50% at the celiac artery origin and no significant narrowing at the SMA origin. The IMA has been excluded by the prior EVAR repair. Aortic Atherosclerosis (ICD10-I70.0). Redemonstration of EVAR with no late complicating features as above. Redemonstration of left femoral artery pseudoaneurysm, relatively unchanged over time with no evidence of significant enlargement or inflammation. Additional ancillary findings as above. Signed, Yvone Neu. Reyne Dumas, RPVI [facility] Radiology Electronically Signed   By: Gilmer Mor D.O.   On: 07/15/2020 13:33        Discharge Exam: Vitals:   07/18/20 0331 07/18/20 0945  BP: 135/70 (!) 158/90  Pulse: 70 89  Resp: 20 20  Temp: 98.3 F (36.8 C) (!) 97.5 F (36.4 C)  SpO2: 100% 98%   Vitals:   07/17/20 1555 07/17/20 2015 07/18/20 0331 07/18/20 0945  BP: (!) 144/93 (!) 142/91 135/70 (!) 158/90  Pulse:  73 70 89  Resp:  Temp:  98.2 F (36.8 C) 98.3 F (36.8 C) (!) 97.5 F (36.4 C)  TempSrc:  Oral  Oral  SpO2: 100% 97% 100% 98%  Weight:      Height:        General: Pt is alert, awake, not in acute distress Cardiovascular: RRR, S1/S2 +, no rubs, no gallops Respiratory: CTA bilaterally, no wheezing, no rhonchi Abdominal:  Soft, NT, ND, bowel sounds + Extremities: no edema, no cyanosis   The  results of significant diagnostics from this hospitalization (including imaging, microbiology, ancillary and laboratory) are listed below for reference.    Significant Diagnostic Studies: DG Abd Portable 2V  Result Date: 07/15/2020 CLINICAL DATA:  Abdominal pain and vomiting EXAM: PORTABLE ABDOMEN - 2 VIEW COMPARISON:  04/16/2020 FINDINGS: Chronic basilar interstitial changes. Previous median sternotomy for aortic [name] replacement. Normal heart size. No pleural effusion. Negative free air. Scattered air and stool throughout the bowel without obstruction pattern or ileus. Bifurcated aortoiliac stent graft noted. Degenerative changes of the spine. IMPRESSION: No acute finding by plain radiography. Negative for obstruction or free air. Electronically Signed   By: Sean Petit.  Shick M.D.   On: 07/15/2020 11:55   CT Angio Abd/Pel W and/or Wo Contrast  Result Date: 07/15/2020 CLINICAL DATA:  70 year old male with a history of acute abrupt onset of abdominal pain EXAM: CTA ABDOMEN AND PELVIS WITHOUT AND WITH CONTRAST TECHNIQUE: Multidetector CT imaging of the abdomen and pelvis was performed using the standard protocol during bolus administration of intravenous contrast. Multiplanar reconstructed images and MIPs were obtained and reviewed to evaluate the vascular anatomy. CONTRAST:  OMNIPAQUE IOHEXOL 300 MG/ML  SOLN COMPARISON:  04/16/2020 FINDINGS: VASCULAR Aorta: Distal thoracic aorta with no significant atherosclerotic plaque. Diameter at the hiatus measures 2.4 cm. Redemonstration endovascular repair of infrarenal abdominal aortic aneurysm with infrarenal fixation, delivered from the right. Maximum diameter of the excluded aneurysm sac estimated 4.6 cm, which is essentially unchanged from the comparison. No evidence type 1 or type 2 endoleak on the delayed images. No evidence migration of the stent graft. Celiac: Mixed calcified and soft plaque at the origin of the celiac artery with 50% narrowing estimated.  Branches remain patent. SMA: Atherosclerotic plaque at the origin of the SMA. No evidence of high-grade stenosis at the SMA origin on the sagittal images. Branches remain patent. Renals: - Right: Single right renal artery. No significant atherosclerotic changes at the origin. Pre hilar branch to the upper pole. Normal course caliber and contour. - Left: Single left renal artery. No significant atherosclerotic changes at the origin. Normal course caliber and contour with no pre hilar branches. IMA: IMA has been excluded. There appears to be collateral flow of the IMA beyond the origin. Right lower extremity: The right iliac limb terminates in the common iliac artery. The limb remains patent without significant plaque or endo luminal thrombus. Mild atherosclerotic plaque of the common iliac artery. 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 Mild atherosclerotic changes of the external iliac artery. Common femoral artery patent with associated postsurgical changes. No evidence of pseudoaneurysm of the right common femoral artery. Proximal profunda femoris and SFA patent. Left lower extremity: The left iliac limb terminates within the proximal external iliac artery. There is similar degree of mild circumferential endoluminal thrombus without significant narrowing. Hypogastric artery remains patent. External iliac artery patent with no significant atherosclerotic changes. Redemonstration of pseudoaneurysm/aneurysm of the common femoral artery with the greatest diameter 16 mm. No evidence of significant change in size or configuration. No inflammatory changes or Peri arterial fluid. Redemonstration of patent profunda femoris and lateral circumflex femoral artery. Native SFA remains occluded. Veins: Unremarkable appearance of the venous system. Review of the MIP images confirms the above findings. NON-VASCULAR Lower chest: Hiatal hernia.  No acute finding of the lower chest. Hepatobiliary: Unremarkable appearance  of the liver. Hyperdense material within the gallbladder, potentially microlithiasis or vicarious excretion of contrast.  No inflammatory changes. No intrahepatic biliary ductal dilatation. Pancreas: Unremarkable. Spleen: Unremarkable. Adrenals/Urinary Tract: - Right adrenal gland: Unchanged appearance of right adrenal gland. - Left adrenal gland: Unchanged appearance of left adrenal gland. - Right kidney: No hydronephrosis, nephrolithiasis, inflammation, or ureteral dilation. No focal lesion. - Left Kidney: No hydronephrosis, nephrolithiasis, inflammation, or ureteral dilation. No focal lesion. - Urinary Bladder: Urinary bladder somewhat distended with no inflammatory changes or stones within the lumen. Stomach/Bowel: - Stomach: Hiatal hernia.  Otherwise unremarkable stomach. - Small bowel: No abnormal distension. No focal inflammatory changes. No air-fluid levels. Partially fecalized distal small bowel with no evidence of obstruction. - Appendix: Normal. - Colon: Unremarkable colon with no evidence of obstruction. No inflammatory changes. Diverticular disease present as previous. Lymphatic: No adenopathy. Mesenteric: No free fluid or air. No mesenteric adenopathy. Reproductive: Transverse diameter of the prostate estimated 3.1 cm. Internal calcifications. Other: Small fat containing inguinal hernia bilaterally. Small fat containing umbilical hernia. Musculoskeletal: No acute displaced fracture. No bony canal narrowing. IMPRESSION: CT angiogram is negative for evidence of acute mesenteric ischemia. No acute CT finding to account for patient's abdominal pain. Partial fecalization of distal small bowel above the terminal ileum without evidence of obstruction or inflammatory changes. This is favored to represent slow transit, sometimes seen in the setting of chronic pain medication use. Aortic atherosclerosis is present, as well as atherosclerotic changes at the mesenteric artery origins, estimated 50% at the celiac  artery origin and no significant narrowing at the SMA origin. The IMA has been excluded by the prior EVAR repair. Aortic Atherosclerosis (ICD10-I70.0). Redemonstration of EVAR with no late complicating features as above. Redemonstration of left femoral artery pseudoaneurysm, relatively unchanged over time with no evidence of significant enlargement or inflammation. Additional ancillary findings as above. Signed, Yvone Neu. Reyne Dumas, RPVI [facility] Radiology Electronically Signed   By: Gilmer Mor D.O.   On: 07/15/2020 13:33    Microbiology: Recent Results (from the past 240 hour(s))  Resp Panel by RT-PCR (Flu A&B, Covid) Nasopharyngeal Swab     Status: None   Collection Time: 07/15/20 11:46 AM   Specimen: Nasopharyngeal Swab; Nasopharyngeal(NP) swabs in vial transport medium  Result Value Ref Range Status   SARS Coronavirus 2 by RT PCR NEGATIVE NEGATIVE Final    Comment: (NOTE) SARS-CoV-2 target nucleic acids are NOT DETECTED.  The SARS-CoV-2 RNA is generally detectable in upper respiratory specimens during the acute phase of infection. The lowest concentration of SARS-CoV-2 viral copies this assay can detect is 138 copies/mL. A negative result does not preclude SARS-Cov-2 infection and should not be used as the sole basis for treatment or other patient management decisions. A negative result may occur with  improper specimen collection/handling, submission of specimen other than nasopharyngeal swab, presence of viral mutation(s) within the areas targeted by this assay, and inadequate number of viral copies(<138 copies/mL). A negative result must be combined with clinical observations, patient history, and epidemiological information. The expected result is Negative.  Fact Sheet for Patients:  BloggerCourse.com  Fact Sheet for Healthcare Providers:  SeriousBroker.it  This test is no t  yet approved or cleared by the [place] FDA and  has been authorized for detection and/or diagnosis of SARS-CoV-2 by FDA under an Emergency Use Authorization (EUA). This EUA will remain  in effect (meaning this test can be used) for the duration of the COVID-19 declaration under Section 564(b)(1) of the Act, 21 U.S.C.section 360bbb-3(b)(1), unless the authorization is terminated  or revoked  sooner.       Influenza A by PCR NEGATIVE NEGATIVE Final   Influenza B by PCR NEGATIVE NEGATIVE Final    Comment: (NOTE) The Xpert Xpress SARS-CoV-2/FLU/RSV plus assay is intended as an aid in the diagnosis of influenza from Nasopharyngeal swab specimens and should not be used as a sole basis for treatment. Nasal washings and aspirates are unacceptable for Xpert Xpress SARS-CoV-2/FLU/RSV testing.  Fact Sheet for Patients: BloggerCourse.com  Fact Sheet for Healthcare Providers: SeriousBroker.it  This test is not yet approved or cleared by the [place] FDA and has been authorized for detection and/or diagnosis of SARS-CoV-2 by FDA under an Emergency Use Authorization (EUA). This EUA will remain in effect (meaning this test can be used) for the duration of the COVID-19 declaration under Section 564(b)(1) of the Act, 21 U.S.C. section 360bbb-3(b)(1), unless the authorization is terminated or revoked.  Performed at [facility], [address] [place], [place] [address]   Blood culture (routine x 2)     Status: None (Preliminary result)   Collection Time: 07/15/20  2:58 PM   Specimen: BLOOD LEFT ARM  Result Value Ref Range Status   Specimen Description BLOOD LEFT ARM BOTTLES DRAWN AEROBIC AND ANAEROBIC  Final   Special Requests Blood Culture adequate volume  Final   Culture   Final    NO GROWTH 3 DAYS Performed at [facility], [address] [place], [place] [address]    Report Status PENDING  Incomplete  Blood culture (routine x  2)     Status: None (Preliminary result)   Collection Time: 07/15/20  3:25 PM   Specimen: Right Antecubital; Blood  Result Value Ref Range Status   Specimen Description   Final    RIGHT ANTECUBITAL BOTTLES DRAWN AEROBIC AND ANAEROBIC   Special Requests Blood Culture adequate volume  Final   Culture   Final    NO GROWTH 3 DAYS Performed at [facility], [address], [place], [place] [address]    Report Status PENDING  Incomplete     Labs: Basic Metabolic Panel: Recent Labs  Lab 07/15/20 1203 07/15/20 1631 07/16/20 0431 07/17/20 0606 07/18/20 0620  NA 133*  --  133* 134* 137  K 3.8  --  3.9 4.5 4.0  CL 101  --  102 102 103  CO2 22  --  24 23 24   GLUCOSE 126*  --  120* 93 80  BUN 13  --  14 13 13   CREATININE 0.71  --  0.84 0.75 0.78  CALCIUM 8.4*  --  8.2* 8.5* 8.3*  MG  --  2.1  --  2.1 2.3   Liver Function Tests: Recent Labs  Lab 07/15/20 1203  AST 22  ALT 16  ALKPHOS 60  BILITOT 0.7  PROT 7.2  ALBUMIN 3.8   Recent Labs  Lab 07/15/20 1203  LIPASE 43   No results for input(s): AMMONIA in the last 168 hours. CBC: Recent Labs  Lab 07/15/20 1017 07/16/20 0431 07/17/20 0606 07/18/20 0620  WBC 24.1* 17.7* 13.6* 12.1*  NEUTROABS 19.1*  --   --   --   HGB 17.8* 14.8 16.5 16.3  HCT 52.1* 45.3 51.2 50.2  MCV 86.7 89.2 88.9 89.5  PLT 245 207 182 189   Cardiac Enzymes: No results for input(s): CKTOTAL, CKMB, CKMBINDEX, TROPONINI in the last 168 hours. BNP: Invalid input(s): POCBNP CBG: Recent Labs  Lab 07/17/20 1117 07/17/20 1604 07/17/20 2016 07/18/20 0334 07/18/20 0735  GLUCAP 158* 127*  116* 86 85    Time coordinating discharge:  36 minutes  Signed:  Catarina Hartshorn, DO [facility] Pager: [phone] 07/18/2020, 12:40 PM

## 2020-07-22 LAB — CULTURE, BLOOD (ROUTINE X 2)
Culture: NO GROWTH
Culture: NO GROWTH
Special Requests: ADEQUATE
Special Requests: ADEQUATE

## 2020-08-17 ENCOUNTER — Telehealth: Payer: Self-pay | Admitting: Licensed Clinical Social Worker

## 2020-08-17 NOTE — Progress Notes (Signed)
Heart and Vascular Care Navigation  08/17/2020  [name] Aug 05, 1950 [id number]  Reason for Referral:    Engaged with patient by telephone for follow up visit for Heart and Vascular Care Coordination.                                                                                                   Assessment:              LCSW received referral that pt had stopped by [facility] office stating he needs assistance w/ his propane tank bc he is not able to make hot meals and it is making him feel bad. LCSW reviewed chart, noted that pt has previously spoken with LCSW Annice Pih regarding propane costs.   I called and was able to reach pt at [phone]. Introduced self, role, reason for call. Pt confirmed he had previously spoken w/ Annice Pih earlier this year about his propane. He has recently moved into the current address listed on Epic, confirmed current coverage and PCP. He lives alone, utilizes a prosthetic to get around. He confirms his sister is his primary contact. Denies issues affording his medications or food as he receives SSI (~$1090/mo) and assistance from [facility] Medicare monthly for food (~$220/mo). His current rent is $275/month. LCSW encouraged pt to reach out to county DSS first to see if any utility assistance currently available. Pt states he hasn't done that since he has utilized the [employer] program in the past and it isn't opened yet. LCSW explained that even in the non-LIEAP enrollment time they may have some general utility assistance.  LCSW shared that team lead is out of office until Wednesday. I will speak with her Thursday about potential for assistance. Pt confirms he does have food he can eat in the home and is able to get more as needed.   HRT/VAS Care Coordination     Patients Home Cardiology Office [facility]   Outpatient Care Team Social Worker   Social Worker Name: Lasandra Beech, [place] [phone]   Living arrangements for the past 2 months Single  Family Home   Lives with: Self   Patient Current Insurance Coverage Managed Medicare; Medicaid   Patient Has Concern With Paying Medical Bills No   Does Patient Have Prescription Coverage? Yes   Home Assistive Devices/Equipment Prosthesis       Social History:                                                                             SDOH Screenings   Alcohol Screen: Not on file  Depression (XBM8-4): Not on file  Financial Resource Strain: High Risk   Difficulty of Paying Living Expenses: Hard  Food Insecurity: No Food Insecurity   Worried About Running Out of Food in the Last Year: Never true  Ran Out of Food in the Last Year: Never true  Housing: Low Risk    Last Housing Risk Score: 0  Physical Activity: Not on file  Social Connections: Not on file  Stress: Not on file  Tobacco Use: Medium Risk   Smoking Tobacco Use: Former   Smokeless Tobacco Use: Never  Transportation Needs: No Transportation Needs   Lack of Transportation (Medical): No   Lack of Transportation (Non-Medical): No    SDOH Interventions: Financial Resources:  Financial Strain Interventions: Other (Comment) (spoke with Augusto Gamble regarding cost; referral to Patient Care Fund) DSS for financial assistance  Food Insecurity:  Food Insecurity Interventions: Intervention Not Indicated (recieves $220/mo from [facility] Medicare towards food)  Housing Insecurity:  Housing Interventions: Intervention Not Indicated  Transportation:   Transportation Interventions: Intervention Not Indicated     Other Care Navigation Interventions:     Provided Pharmacy assistance resources  Denies any issued obtaining or affording medications  Patient expressed Mental Health concerns No.   Follow-up plan:   LCSW f/u with [employer]- tank would be about $343.15 to fill. LCSW will f/u with team when team lead returns Thursday regarding how we can assist. I also have placed a call to [place] DSS to see if pt has any  available credit/any programs available through DSS for assistance currently. Will f/u with pt prior to end of the week with any assistance available.

## 2020-08-20 ENCOUNTER — Telehealth: Payer: Self-pay | Admitting: Licensed Clinical Social Worker

## 2020-08-20 NOTE — Telephone Encounter (Signed)
LCSW spoke with [name] again this morning, they were able to pull up pt account this morning and he has a credit of $300.60- his tank will be $343.15 to fill. They accept assistance to be mailed to [employer] and [place], [address] [place], [place], [address].  LCSW spoke with team lead [name], patient care fund will be able to assist with the rest of the account balance of $42.55. I will provide her details to have assistance coordinated.  I then spoke with pt via telephone, advised him of outstanding credit and that we could assist with the rest of the bill. I did let him know that there may be a delay with the check getting to [facility]. Encouraged him to call and see what can be arranged with company to get oil delivered using credit. LCSW encouraged pt also to call me if any additional questions/concerns.    [name], MSW, LCSW [facility] Heart/Vascular Care Navigation  [phone]

## 2020-08-21 ENCOUNTER — Telehealth: Payer: Self-pay | Admitting: Licensed Clinical Social Worker

## 2020-08-21 NOTE — Telephone Encounter (Signed)
LCSW able to assist pt this morning with completing assistance for propane tank. [employer] will create a ticket to get pt tank filled.  I was able to speak with pt after completing call w/ Karoline Caldwell. He is aware to call Karoline Caldwell directly if there is any issues with getting the fill completed. I have made a note to call pt prior to December when LIEAP program opens. He is encouraged to call before then if any concerns/questions arise.   Octavio Graves, MSW, LCSW [facility] Heart/Vascular Care Navigation  [phone]

## 2020-10-12 DIAGNOSIS — E1129 Type 2 diabetes mellitus with other diabetic kidney complication: Secondary | ICD-10-CM | POA: Diagnosis not present

## 2020-10-16 DIAGNOSIS — I25119 Atherosclerotic heart disease of native coronary artery with unspecified angina pectoris: Secondary | ICD-10-CM | POA: Diagnosis not present

## 2020-10-16 DIAGNOSIS — R7309 Other abnormal glucose: Secondary | ICD-10-CM | POA: Diagnosis not present

## 2020-10-16 DIAGNOSIS — Z23 Encounter for immunization: Secondary | ICD-10-CM | POA: Diagnosis not present

## 2020-10-16 DIAGNOSIS — R112 Nausea with vomiting, unspecified: Secondary | ICD-10-CM | POA: Diagnosis not present

## 2020-10-16 DIAGNOSIS — E1122 Type 2 diabetes mellitus with diabetic chronic kidney disease: Secondary | ICD-10-CM | POA: Diagnosis not present

## 2020-10-16 DIAGNOSIS — I700 Atherosclerosis of aorta: Secondary | ICD-10-CM | POA: Diagnosis not present

## 2020-10-17 ENCOUNTER — Emergency Department (HOSPITAL_COMMUNITY): Payer: Medicare Other

## 2020-10-17 ENCOUNTER — Encounter (HOSPITAL_COMMUNITY): Payer: Self-pay | Admitting: Emergency Medicine

## 2020-10-17 ENCOUNTER — Emergency Department (HOSPITAL_COMMUNITY)
Admission: EM | Admit: 2020-10-17 | Discharge: 2020-10-17 | Disposition: A | Discharge status: Home / Self Care | Payer: Medicare Other | Attending: Emergency Medicine | Admitting: Emergency Medicine

## 2020-10-17 ENCOUNTER — Other Ambulatory Visit: Payer: Self-pay

## 2020-10-17 DIAGNOSIS — R1084 Generalized abdominal pain: Secondary | ICD-10-CM | POA: Diagnosis not present

## 2020-10-17 DIAGNOSIS — Z89612 Acquired absence of left leg above knee: Secondary | ICD-10-CM | POA: Insufficient documentation

## 2020-10-17 DIAGNOSIS — Z87891 Personal history of nicotine dependence: Secondary | ICD-10-CM | POA: Diagnosis not present

## 2020-10-17 DIAGNOSIS — R109 Unspecified abdominal pain: Secondary | ICD-10-CM | POA: Diagnosis not present

## 2020-10-17 DIAGNOSIS — E119 Type 2 diabetes mellitus without complications: Secondary | ICD-10-CM | POA: Diagnosis not present

## 2020-10-17 DIAGNOSIS — R112 Nausea with vomiting, unspecified: Secondary | ICD-10-CM | POA: Insufficient documentation

## 2020-10-17 DIAGNOSIS — Z743 Need for continuous supervision: Secondary | ICD-10-CM | POA: Diagnosis not present

## 2020-10-17 DIAGNOSIS — I2510 Atherosclerotic heart disease of native coronary artery without angina pectoris: Secondary | ICD-10-CM | POA: Insufficient documentation

## 2020-10-17 DIAGNOSIS — R1111 Vomiting without nausea: Secondary | ICD-10-CM | POA: Diagnosis not present

## 2020-10-17 DIAGNOSIS — Z79899 Other long term (current) drug therapy: Secondary | ICD-10-CM | POA: Insufficient documentation

## 2020-10-17 DIAGNOSIS — R6889 Other general symptoms and signs: Secondary | ICD-10-CM | POA: Diagnosis not present

## 2020-10-17 DIAGNOSIS — Z7984 Long term (current) use of oral hypoglycemic drugs: Secondary | ICD-10-CM | POA: Diagnosis not present

## 2020-10-17 DIAGNOSIS — R1110 Vomiting, unspecified: Secondary | ICD-10-CM | POA: Diagnosis not present

## 2020-10-17 DIAGNOSIS — R52 Pain, unspecified: Secondary | ICD-10-CM | POA: Diagnosis not present

## 2020-10-17 DIAGNOSIS — R001 Bradycardia, unspecified: Secondary | ICD-10-CM | POA: Diagnosis not present

## 2020-10-17 LAB — COMPREHENSIVE METABOLIC PANEL
ALT: 19 U/L (ref 0–44)
AST: 20 U/L (ref 15–41)
Albumin: 4.9 g/dL (ref 3.5–5.0)
Alkaline Phosphatase: 81 U/L (ref 38–126)
Anion gap: 14 (ref 5–15)
BUN: 14 mg/dL (ref 8–23)
CO2: 26 mmol/L (ref 22–32)
Calcium: 9.7 mg/dL (ref 8.9–10.3)
Chloride: 94 mmol/L — ABNORMAL LOW (ref 98–111)
Creatinine, Ser: 0.67 mg/dL (ref 0.61–1.24)
GFR, Estimated: >60 mL/min (ref >60)
Glucose, Bld: 156 mg/dL — ABNORMAL HIGH (ref 70–99)
Potassium: 3.9 mmol/L (ref 3.5–5.1)
Sodium: 134 mmol/L — ABNORMAL LOW (ref 135–145)
Total Bilirubin: 0.8 mg/dL (ref 0.3–1.2)
Total Protein: 9.2 g/dL — ABNORMAL HIGH (ref 6.5–8.1)

## 2020-10-17 LAB — CBC
HCT: 50.7 % (ref 39.0–52.0)
Hemoglobin: 16.9 g/dL (ref 13.0–17.0)
MCH: 29.7 pg (ref 26.0–34.0)
MCHC: 33.3 g/dL (ref 30.0–36.0)
MCV: 89.1 fL (ref 80.0–100.0)
Platelets: 251 10*3/uL (ref 150–400)
RBC: 5.69 MIL/uL (ref 4.22–5.81)
RDW: 15.2 % (ref 11.5–15.5)
WBC: 17.1 10*3/uL — ABNORMAL HIGH (ref 4.0–10.5)
nRBC: 0 % (ref 0.0–0.2)

## 2020-10-17 LAB — LIPASE, BLOOD: Lipase: 28 U/L (ref 11–51)

## 2020-10-17 MED ORDER — ONDANSETRON 4 MG PO TBDP: 4.0000 mg | ORAL_TABLET | Freq: Three times a day (TID) | ORAL | 0 refills | Status: DC | PRN

## 2020-10-17 MED ORDER — FENTANYL CITRATE PF 50 MCG/ML IJ SOSY
50.0000 ug | PREFILLED_SYRINGE | Freq: Once | INTRAMUSCULAR | Status: AC
  Administered 2020-10-17: 50 ug via INTRAVENOUS
  Filled 2020-10-17: qty 1

## 2020-10-17 MED ORDER — SODIUM CHLORIDE 0.9 % IV BOLUS
1000.0000 mL | Freq: Once | INTRAVENOUS | Status: AC
  Administered 2020-10-17: 1000 mL via INTRAVENOUS

## 2020-10-17 MED ORDER — IOHEXOL 300 MG/ML  SOLN
100.0000 mL | Freq: Once | INTRAMUSCULAR | Status: AC | PRN
  Administered 2020-10-17: 100 mL via INTRAVENOUS

## 2020-10-17 MED ORDER — ONDANSETRON HCL 4 MG/2ML IJ SOLN
4.0000 mg | INTRAMUSCULAR | Status: AC
  Administered 2020-10-17: 4 mg via INTRAVENOUS
  Filled 2020-10-17: qty 2

## 2020-10-17 MED ORDER — ONDANSETRON HCL 4 MG/2ML IJ SOLN
4.0000 mg | Freq: Once | INTRAMUSCULAR | Status: AC
  Administered 2020-10-17: 4 mg via INTRAVENOUS
  Filled 2020-10-17: qty 2

## 2020-10-17 NOTE — ED Notes (Signed)
Pt unable to sign MSE d/t signature pad being broke

## 2020-10-17 NOTE — Discharge Instructions (Addendum)
Your testing here has not shown any specific abnormalities. The CT scan of your abdomen and pelvis did not show any problems with your prior surgery on your aneurysm I have prescribed a medicine called Zofran which will help with nausea and vomiting, drink plenty of clear liquids, things should get better over the next couple of days however if you develop worsening symptoms including worsening pain, chest pain, vomiting, fever, palpitations, shortness of breath or any other severe or worsening symptoms return to the emergency department immediately, otherwise see your doctor in 2 or 3 days for recheck

## 2020-10-17 NOTE — ED Provider Notes (Signed)
This patient is a 70 year old male presenting to the hospital with a complaint of some nausea and vomiting that started yesterday after he drank some milk that he thought was bad.  He went to see his primary who told him to come to the hospital if he got worse.  He had continued to vomit today with hiccups, denies any significant abdominal pain except he states that when he hiccups it hurts his stomach.  No diarrhea, on my exam he has no abdominal distention with a very soft abdomen.  His heart and lung exams are unremarkable, he is slightly hypertensive, he is a 17,000 white count and a CT scan of the abdomen and pelvis which is totally unremarkable.  He was given Zofran and fentanyl and improved, he has now been given some IV fluids and Zofran and feels better.  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  He does not have tenderness in that region at all and there is no swelling or redness.  Of note his left lower extremity prosthesis puts pressure into that region.  There is no skin breakdown, no swelling, he otherwise appears well and I anticipate discharge.   Eber Hong, MD 10/17/20 [phone]
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
## 2020-10-17 NOTE — ED Triage Notes (Signed)
Pt arrived via [facility]. Pt c/o of N/V since drinking some bad milk yesterday morning. Went to see his primary yesterday and he didn't feel better today so he called ems. Pt is hypertensive. Pt has not had his bp meds today. Pt had 4mg  zofran odt.

## 2020-10-19 ENCOUNTER — Other Ambulatory Visit: Payer: Self-pay | Admitting: Student
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
## 2020-11-23 ENCOUNTER — Encounter: Payer: Self-pay | Admitting: Internal Medicine

## 2020-11-23 ENCOUNTER — Ambulatory Visit (INDEPENDENT_AMBULATORY_CARE_PROVIDER_SITE_OTHER): Payer: Medicare Other | Admitting: Internal Medicine

## 2020-11-23 ENCOUNTER — Other Ambulatory Visit (HOSPITAL_COMMUNITY)
Admission: RE | Admit: 2020-11-23 | Discharge: 2020-11-23 | Disposition: A | Discharge status: Home / Self Care | Payer: Medicare Other | Source: Ambulatory Visit | Attending: Internal Medicine | Admitting: Internal Medicine

## 2020-11-23 ENCOUNTER — Other Ambulatory Visit: Payer: Self-pay

## 2020-11-23 VITALS — BP 120/68 | HR 73 | Ht 70.0 in | Wt 142.4 lb

## 2020-11-23 DIAGNOSIS — E785 Hyperlipidemia, unspecified: Secondary | ICD-10-CM | POA: Insufficient documentation

## 2020-11-23 DIAGNOSIS — I714 Abdominal aortic aneurysm, without rupture, unspecified: Secondary | ICD-10-CM

## 2020-11-23 DIAGNOSIS — Z952 Presence of prosthetic heart valve: Secondary | ICD-10-CM | POA: Diagnosis not present

## 2020-11-23 DIAGNOSIS — I2510 Atherosclerotic heart disease of native coronary artery without angina pectoris: Secondary | ICD-10-CM | POA: Diagnosis not present

## 2020-11-23 LAB — LIPID PANEL
Cholesterol: 103 mg/dL (ref 0–200)
HDL: 36 mg/dL — ABNORMAL LOW (ref >40)
LDL Cholesterol: 54 mg/dL (ref 0–99)
Total CHOL/HDL Ratio: 2.9 RATIO
Triglycerides: 63 mg/dL (ref <150)
VLDL: 13 mg/dL (ref 0–40)

## 2020-11-23 LAB — ALT: ALT: 10 U/L (ref 0–44)

## 2020-11-23 NOTE — Patient Instructions (Signed)
Medication Instructions:  None  Labwork: Lipids ALT  Testing/Procedures: None  Follow-Up: 1 year with Dr. Trish Mage  Any Other Special Instructions Will Be Listed Below (If Applicable).     If you need a refill on your cardiac medications before your next appointment, please call your pharmacy.

## 2021-01-06 DIAGNOSIS — I2510 Atherosclerotic heart disease of native coronary artery without angina pectoris: Secondary | ICD-10-CM | POA: Diagnosis not present

## 2021-01-06 DIAGNOSIS — M1990 Unspecified osteoarthritis, unspecified site: Secondary | ICD-10-CM | POA: Diagnosis not present

## 2021-01-06 DIAGNOSIS — Z79899 Other long term (current) drug therapy: Secondary | ICD-10-CM | POA: Diagnosis not present

## 2021-01-06 DIAGNOSIS — I700 Atherosclerosis of aorta: Secondary | ICD-10-CM | POA: Diagnosis not present

## 2021-01-06 DIAGNOSIS — E1129 Type 2 diabetes mellitus with other diabetic kidney complication: Secondary | ICD-10-CM | POA: Diagnosis not present

## 2021-01-13 DIAGNOSIS — E1122 Type 2 diabetes mellitus with diabetic chronic kidney disease: Secondary | ICD-10-CM | POA: Diagnosis not present

## 2021-01-13 DIAGNOSIS — I25119 Atherosclerotic heart disease of native coronary artery with unspecified angina pectoris: Secondary | ICD-10-CM | POA: Diagnosis not present

## 2021-01-13 DIAGNOSIS — E785 Hyperlipidemia, unspecified: Secondary | ICD-10-CM | POA: Diagnosis not present

## 2021-01-13 DIAGNOSIS — R7309 Other abnormal glucose: Secondary | ICD-10-CM | POA: Diagnosis not present

## 2021-01-15 DIAGNOSIS — I25119 Atherosclerotic heart disease of native coronary artery with unspecified angina pectoris: Secondary | ICD-10-CM | POA: Diagnosis not present

## 2021-01-15 DIAGNOSIS — E1129 Type 2 diabetes mellitus with other diabetic kidney complication: Secondary | ICD-10-CM | POA: Diagnosis not present

## 2021-01-19 ENCOUNTER — Encounter: Payer: Self-pay | Admitting: *Deleted

## 2021-01-20 ENCOUNTER — Telehealth: Payer: Self-pay | Admitting: Licensed Clinical Social Worker

## 2021-01-20 NOTE — Telephone Encounter (Signed)
LCSW received a call from pt requesting assistance w/ propane.  LCSW inquired if pt had completed LIHEAP application, he states he had gone to DSS in November and they had helped them then. I shared that likely was one time assistance but not LIHEAP, I encouraged him first to call Karoline Caldwell and ensure he does not have any balance left. If he does not then he needs to go to DSS and ensure he has not already applied for LIHEAP/apply for it if he hasnt. Pt shares he will call me back with update.   [name], MSW, LCSW Clinical Social Worker II [facility] Navigation  [phone]- work cell phone (preferred) [phone]- desk phone

## 2021-02-07 ENCOUNTER — Emergency Department (HOSPITAL_COMMUNITY): Payer: Medicare HMO

## 2021-02-07 ENCOUNTER — Other Ambulatory Visit: Payer: Self-pay

## 2021-02-07 ENCOUNTER — Observation Stay (HOSPITAL_COMMUNITY)
Admission: EM | Admit: 2021-02-07 | Discharge: 2021-02-08 | Disposition: A | Discharge status: Home / Self Care | Payer: Medicare HMO | Attending: Family Medicine | Admitting: Family Medicine

## 2021-02-07 ENCOUNTER — Encounter (HOSPITAL_COMMUNITY): Payer: Self-pay | Admitting: *Deleted

## 2021-02-07 DIAGNOSIS — R079 Chest pain, unspecified: Secondary | ICD-10-CM | POA: Diagnosis not present

## 2021-02-07 DIAGNOSIS — Z20822 Contact with and (suspected) exposure to covid-19: Secondary | ICD-10-CM | POA: Insufficient documentation

## 2021-02-07 DIAGNOSIS — Z7984 Long term (current) use of oral hypoglycemic drugs: Secondary | ICD-10-CM | POA: Diagnosis not present

## 2021-02-07 DIAGNOSIS — Z7982 Long term (current) use of aspirin: Secondary | ICD-10-CM | POA: Insufficient documentation

## 2021-02-07 DIAGNOSIS — I2510 Atherosclerotic heart disease of native coronary artery without angina pectoris: Secondary | ICD-10-CM | POA: Diagnosis not present

## 2021-02-07 DIAGNOSIS — E119 Type 2 diabetes mellitus without complications: Secondary | ICD-10-CM

## 2021-02-07 DIAGNOSIS — R0789 Other chest pain: Secondary | ICD-10-CM | POA: Diagnosis not present

## 2021-02-07 DIAGNOSIS — S78112A Complete traumatic amputation at level between left hip and knee, initial encounter: Secondary | ICD-10-CM | POA: Diagnosis present

## 2021-02-07 DIAGNOSIS — Z794 Long term (current) use of insulin: Secondary | ICD-10-CM | POA: Insufficient documentation

## 2021-02-07 DIAGNOSIS — Z79899 Other long term (current) drug therapy: Secondary | ICD-10-CM | POA: Diagnosis not present

## 2021-02-07 DIAGNOSIS — Z952 Presence of prosthetic heart valve: Secondary | ICD-10-CM

## 2021-02-07 LAB — COMPREHENSIVE METABOLIC PANEL
ALT: 12 U/L (ref 0–44)
AST: 14 U/L — ABNORMAL LOW (ref 15–41)
Albumin: 3.6 g/dL (ref 3.5–5.0)
Alkaline Phosphatase: 57 U/L (ref 38–126)
Anion gap: 3 — ABNORMAL LOW (ref 5–15)
BUN: 13 mg/dL (ref 8–23)
CO2: 28 mmol/L (ref 22–32)
Calcium: 8.9 mg/dL (ref 8.9–10.3)
Chloride: 107 mmol/L (ref 98–111)
Creatinine, Ser: 0.83 mg/dL (ref 0.61–1.24)
GFR, Estimated: >60 mL/min (ref >60)
Glucose, Bld: 91 mg/dL (ref 70–99)
Potassium: 4.6 mmol/L (ref 3.5–5.1)
Sodium: 138 mmol/L (ref 135–145)
Total Bilirubin: 0.3 mg/dL (ref 0.3–1.2)
Total Protein: 6.8 g/dL (ref 6.5–8.1)

## 2021-02-07 LAB — CBC WITH DIFFERENTIAL/PLATELET
Abs Immature Granulocytes: 0.02 10*3/uL (ref 0.00–0.07)
Basophils Absolute: 0 10*3/uL (ref 0.0–0.1)
Basophils Relative: 0 %
Eosinophils Absolute: 0.1 10*3/uL (ref 0.0–0.5)
Eosinophils Relative: 2 %
HCT: 42.5 % (ref 39.0–52.0)
Hemoglobin: 13.7 g/dL (ref 13.0–17.0)
Immature Granulocytes: 0 %
Lymphocytes Relative: 27 %
Lymphs Abs: 2.5 10*3/uL (ref 0.7–4.0)
MCH: 29.7 pg (ref 26.0–34.0)
MCHC: 32.2 g/dL (ref 30.0–36.0)
MCV: 92.2 fL (ref 80.0–100.0)
Monocytes Absolute: 0.9 10*3/uL (ref 0.1–1.0)
Monocytes Relative: 9 %
Neutro Abs: 5.8 10*3/uL (ref 1.7–7.7)
Neutrophils Relative %: 62 %
Platelets: 184 10*3/uL (ref 150–400)
RBC: 4.61 MIL/uL (ref 4.22–5.81)
RDW: 14.3 % (ref 11.5–15.5)
WBC: 9.3 10*3/uL (ref 4.0–10.5)
nRBC: 0 % (ref 0.0–0.2)

## 2021-02-07 LAB — TROPONIN I (HIGH SENSITIVITY)
Troponin I (High Sensitivity): 5 ng/L (ref <18)
Troponin I (High Sensitivity): 5 ng/L (ref <18)

## 2021-02-07 LAB — RESP PANEL BY RT-PCR (FLU A&B, COVID) ARPGX2
Influenza A by PCR: NEGATIVE
Influenza B by PCR: NEGATIVE
SARS Coronavirus 2 by RT PCR: NEGATIVE

## 2021-02-07 LAB — RAPID URINE DRUG SCREEN, HOSP PERFORMED
Amphetamines: NOT DETECTED
Barbiturates: NOT DETECTED
Benzodiazepines: NOT DETECTED
Cocaine: NOT DETECTED
Opiates: NOT DETECTED
Tetrahydrocannabinol: POSITIVE — AB

## 2021-02-07 LAB — PROTIME-INR
INR: 1 (ref 0.8–1.2)
Prothrombin Time: 12.8 seconds (ref 11.4–15.2)

## 2021-02-07 LAB — MAGNESIUM: Magnesium: 1.9 mg/dL (ref 1.7–2.4)

## 2021-02-07 LAB — GLUCOSE, CAPILLARY: Glucose-Capillary: 91 mg/dL (ref 70–99)

## 2021-02-07 MED ORDER — INSULIN ASPART 100 UNIT/ML IJ SOLN: 0.0000 [IU] | Freq: Three times a day (TID) | INTRAMUSCULAR | Status: DC

## 2021-02-07 MED ORDER — ACETAMINOPHEN 325 MG PO TABS
650.0000 mg | ORAL_TABLET | Freq: Four times a day (QID) | ORAL | Status: DC | PRN
  Administered 2021-02-08: 650 mg via ORAL
  Filled 2021-02-07: qty 2

## 2021-02-07 MED ORDER — ACETAMINOPHEN 650 MG RE SUPP: 650.0000 mg | Freq: Four times a day (QID) | RECTAL | Status: DC | PRN

## 2021-02-07 MED ORDER — ONDANSETRON HCL 4 MG PO TABS: 4.0000 mg | ORAL_TABLET | Freq: Four times a day (QID) | ORAL | Status: DC | PRN

## 2021-02-07 MED ORDER — TAMSULOSIN HCL 0.4 MG PO CAPS
0.4000 mg | ORAL_CAPSULE | Freq: Every day | ORAL | Status: DC
  Administered 2021-02-08: 0.4 mg via ORAL
  Filled 2021-02-07: qty 1

## 2021-02-07 MED ORDER — SIMVASTATIN 20 MG PO TABS
40.0000 mg | ORAL_TABLET | Freq: Every day | ORAL | Status: DC
  Administered 2021-02-07: 40 mg via ORAL
  Filled 2021-02-07: qty 2

## 2021-02-07 MED ORDER — ASPIRIN 81 MG PO CHEW
243.0000 mg | CHEWABLE_TABLET | Freq: Once | ORAL | Status: AC
  Administered 2021-02-07: 243 mg via ORAL
  Filled 2021-02-07: qty 3

## 2021-02-07 MED ORDER — POLYETHYLENE GLYCOL 3350 17 G PO PACK: 17.0000 g | PACK | Freq: Every day | ORAL | Status: DC | PRN

## 2021-02-07 MED ORDER — ONDANSETRON HCL 4 MG/2ML IJ SOLN: 4.0000 mg | Freq: Four times a day (QID) | INTRAMUSCULAR | Status: DC | PRN

## 2021-02-07 MED ORDER — NITROGLYCERIN 0.4 MG SL SUBL
0.4000 mg | SUBLINGUAL_TABLET | SUBLINGUAL | Status: DC | PRN
  Administered 2021-02-08: 0.4 mg via SUBLINGUAL
  Filled 2021-02-07: qty 1

## 2021-02-07 MED ORDER — LISINOPRIL 5 MG PO TABS
5.0000 mg | ORAL_TABLET | Freq: Every day | ORAL | Status: DC
  Administered 2021-02-08: 5 mg via ORAL
  Filled 2021-02-07: qty 1

## 2021-02-07 MED ORDER — METOPROLOL TARTRATE 25 MG PO TABS
12.5000 mg | ORAL_TABLET | Freq: Two times a day (BID) | ORAL | Status: DC
  Administered 2021-02-07 – 2021-02-08 (×2): 12.5 mg via ORAL
  Filled 2021-02-07 (×2): qty 1

## 2021-02-07 MED ORDER — ENOXAPARIN SODIUM 40 MG/0.4ML IJ SOSY
40.0000 mg | PREFILLED_SYRINGE | INTRAMUSCULAR | Status: DC
  Filled 2021-02-07: qty 0.4

## 2021-02-07 MED ORDER — ASPIRIN EC 81 MG PO TBEC
81.0000 mg | DELAYED_RELEASE_TABLET | Freq: Every day | ORAL | Status: DC
  Administered 2021-02-08: 81 mg via ORAL
  Filled 2021-02-07 (×2): qty 1

## 2021-02-07 NOTE — ED Triage Notes (Signed)
Chest pain x 3 days
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
## 2021-02-07 NOTE — ED Notes (Signed)
Unable to obtain blood from IV access 

## 2021-02-07 NOTE — Plan of Care (Signed)

## 2021-02-07 NOTE — ED Provider Notes (Signed)
[facility] EMERGENCY DEPARTMENT Provider Note   CSN: [id number] Arrival date & time: 02/07/21  1137     History  Chief Complaint  Patient presents with   Chest Pain    [name] is a 71 y.o. male.  HPI Patient presents for concern of chest discomfort.  Medical history is notable for AAA, aortic stenosis, CAD, DM, HLD, and previous MI.  Per chart review, patient last underwent a heart cath in 2018.  At that time, he had evidence of multivessel disease.  He had a nuclear medicine study in 2019.  Recent history is as follows: Over the past 3 days, he has had intermittent episodes of chest discomfort.  He describes this as a sensation that his heart is being shocked by electrical probes.  He first noticed this when he was chopping wood.  It does resolve with rest.  Episodes typically last only a few minutes.  He has not had any associated symptoms of shortness of breath, lightheadedness, nausea, or diaphoresis.  Currently, he denies any symptoms.  He does take a daily 81 mg aspirin.    Home Medications Prior to Admission medications   Medication Sig Start Date End Date Taking? Authorizing Provider  aspirin EC 81 MG tablet Take 81 mg by mouth daily.   Yes [provider]  linagliptin (TRADJENTA) 5 MG TABS tablet Take 5 mg by mouth daily.   Yes [provider]  lisinopril (ZESTRIL) 5 MG tablet TAKE 1 TABLET(5 MG) BY MOUTH DAILY Patient taking differently: Take 5 mg by mouth daily. 10/19/20  Yes Strader, [place] M, PA-C  metFORMIN (GLUCOPHAGE) 500 MG tablet Take 1 tablet by mouth 2 (two) times daily. 03/31/20  Yes [provider]  metoprolol tartrate (LOPRESSOR) 25 MG tablet Take 0.5 tablets (12.5 mg total) by mouth 2 (two) times daily. 11/13/17  Yes Lonia Blood, MD  ondansetron (ZOFRAN ODT) 4 MG disintegrating tablet Take 1 tablet (4 mg total) by mouth every 8 (eight) hours as needed for nausea. 10/17/20  Yes Eber Hong, MD  pantoprazole (PROTONIX)  40 MG tablet Take 1 tablet (40 mg total) by mouth daily. 04/16/20  Yes Long, Arlyss Repress, MD  simvastatin (ZOCOR) 40 MG tablet Take 40 mg by mouth at bedtime.   Yes [provider]  tamsulosin (FLOMAX) 0.4 MG CAPS capsule Take 0.4 mg by mouth daily.   Yes [provider]  TRESIBA FLEXTOUCH 100 UNIT/ML SOPN FlexTouch Pen Inject 25 Units into the skin every morning. 12/05/17  Yes [provider]  dicyclomine (BENTYL) 20 MG tablet Take 1 tablet (20 mg total) by mouth 3 (three) times daily as needed for spasms. 04/16/20   Long, Arlyss Repress, MD  HYDROcodone-acetaminophen (NORCO/VICODIN) 5-325 MG tablet One tablet every four hours for pain. 01/23/20   Darreld Mclean, MD      Allergies    Patient has no known allergies.    Review of Systems   Review of Systems  Constitutional:  Negative for activity change, appetite change, chills, fatigue and fever.  HENT:  Negative for ear pain and sore throat.   Eyes:  Negative for pain and visual disturbance.  Respiratory:  Negative for cough, chest tightness, shortness of breath and wheezing.   Cardiovascular:  Positive for chest pain. Negative for palpitations and leg swelling.  Gastrointestinal:  Negative for abdominal pain, diarrhea, nausea and vomiting.  Genitourinary:  Negative for dysuria, flank pain and hematuria.  Musculoskeletal:  Negative for arthralgias, back pain, myalgias and neck  pain.  Skin:  Negative for color change and rash.  Neurological:  Negative for dizziness, seizures, syncope, weakness, light-headedness, numbness and headaches.  Hematological:  Does not bruise/bleed easily.  Psychiatric/Behavioral:  Negative for confusion and decreased concentration.   All other systems reviewed and are negative.  Physical Exam Updated Vital Signs BP 118/61 (BP Location: Left Arm)    Pulse 75    Temp 97.8 F (36.6 C) (Oral)    Resp 20    Ht 5\' 10"  (1.778 m)    Wt 59.1 kg    SpO2 97%    BMI 18.69 kg/m  Physical Exam Vitals and  nursing note reviewed.  Constitutional:      General: He is not in acute distress.    Appearance: He is well-developed and normal weight. He is not ill-appearing, toxic-appearing or diaphoretic.  HENT:     Head: Normocephalic and atraumatic.  Eyes:     Conjunctiva/sclera: Conjunctivae normal.  Neck:     Vascular: No JVD.  Cardiovascular:     Rate and Rhythm: Normal rate and regular rhythm.     Heart sounds: No murmur heard. Pulmonary:     Effort: Pulmonary effort is normal. No respiratory distress.     Breath sounds: Normal breath sounds. No wheezing or rales.  Chest:     Chest wall: No tenderness or edema.  Abdominal:     Palpations: Abdomen is soft.     Tenderness: There is no abdominal tenderness.  Musculoskeletal:        General: No swelling.     Cervical back: Normal range of motion and neck supple.     Right lower leg: No edema.     Left lower leg: No edema.  Skin:    General: Skin is warm.     Capillary Refill: Capillary refill takes less than 2 seconds.     Coloration: Skin is not cyanotic or pale.  Neurological:     General: No focal deficit present.     Mental Status: He is alert and oriented to person, place, and time.     Cranial Nerves: No cranial nerve deficit.     Motor: No weakness.  Psychiatric:        Mood and Affect: Mood normal.        Behavior: Behavior normal.    ED Results / Procedures / Treatments   Labs (all labs ordered are listed, but only abnormal results are displayed) Labs Reviewed  COMPREHENSIVE METABOLIC PANEL - Abnormal; Notable for the following components:      Result Value   AST 14 (*)    Anion gap 3 (*)    All other components within normal limits  RAPID URINE DRUG SCREEN, HOSP PERFORMED - Abnormal; Notable for the following components:   Tetrahydrocannabinol POSITIVE (*)    All other components within normal limits  RESP PANEL BY RT-PCR (FLU A&B, COVID) ARPGX2  MAGNESIUM  CBC WITH DIFFERENTIAL/PLATELET  PROTIME-INR  BASIC  METABOLIC PANEL  CBC  GLUCOSE, CAPILLARY  GLUCOSE, CAPILLARY  HEMOGLOBIN A1C  TROPONIN I (HIGH SENSITIVITY)  TROPONIN I (HIGH SENSITIVITY)  TROPONIN I (HIGH SENSITIVITY)    EKG EKG Interpretation  Date/Time:  Sunday February 07 2021 11:48:05 EST Ventricular Rate:  78 PR Interval:  141 QRS Duration: 96 QT Interval:  369 QTC Calculation: 421 R Axis:   68 Text Interpretation: Sinus rhythm Probable left atrial enlargement Left ventricular hypertrophy Nonspecific T abnormalities, anterior leads Confirmed by Godfrey Pick ([id number]) on 02/07/2021 12:50:53 PM  Radiology DG Chest Portable 1 View  Result Date: 02/07/2021 CLINICAL DATA:  Chest pain 3 days.  History of aortic stenosis. EXAM: PORTABLE CHEST 1 VIEW COMPARISON:  09/06/2018 FINDINGS: Aortic valve replacement. Heart size and vascularity normal. Negative for heart failure. Reticular markings throughout the lungs mostly in the lung bases compatible with chronic scarring. No acute infiltrate or effusion. IMPRESSION: Interstitial fibrosis particularly the bases. No superimposed acute abnormality. Postop aortic valve replacement Electronically Signed   By: Franchot Gallo M.D.   On: 02/07/2021 12:48    Procedures Procedures    Medications Ordered in ED Medications  aspirin EC tablet 81 mg (has no administration in time range)  lisinopril (ZESTRIL) tablet 5 mg (has no administration in time range)  metoprolol tartrate (LOPRESSOR) tablet 12.5 mg (12.5 mg Oral Given 02/07/21 2218)  simvastatin (ZOCOR) tablet 40 mg (40 mg Oral Given 02/07/21 2218)  tamsulosin (FLOMAX) capsule 0.4 mg (has no administration in time range)  nitroGLYCERIN (NITROSTAT) SL tablet 0.4 mg (0.4 mg Sublingual Given 02/08/21 0343)  insulin aspart (novoLOG) injection 0-15 Units (0 Units Subcutaneous Not Given 02/08/21 0622)  enoxaparin (LOVENOX) injection 40 mg (has no administration in time range)  acetaminophen (TYLENOL) tablet 650 mg (650 mg Oral Given 02/08/21 0349)    Or   acetaminophen (TYLENOL) suppository 650 mg ( Rectal See Alternative 02/08/21 0349)  ondansetron (ZOFRAN) tablet 4 mg (has no administration in time range)    Or  ondansetron (ZOFRAN) injection 4 mg (has no administration in time range)  polyethylene glycol (MIRALAX / GLYCOLAX) packet 17 g (has no administration in time range)  aspirin chewable tablet 243 mg (243 mg Oral Given 02/07/21 1200)    ED Course/ Medical Decision Making/ A&P                           Medical Decision Making Amount and/or Complexity of Data Reviewed Labs: ordered. Radiology: ordered.  Risk OTC drugs. Decision regarding hospitalization.   This patient presents to the ED for concern of chest discomfort, this involves an extensive number of treatment options, and is a complaint that carries with it a high risk of complications and morbidity.  The differential diagnosis includes ACS, PE, aortic dissection, pericarditis, musculoskeletal etiology,   Co morbidities that complicate the patient evaluation  AAA, aortic stenosis, CAD, DM, HLD, and previous MI.    Additional history obtained:  Additional history obtained from N/A External records from outside source obtained and reviewed including EMR   Lab Tests:  I Ordered, and personally interpreted labs.  The pertinent results include: Normal lab work, including troponins   Imaging Studies ordered:  I ordered imaging studies including chest x-ray I independently visualized and interpreted imaging which showed no acute findings I agree with the radiologist interpretation   Cardiac Monitoring:  The patient was maintained on a cardiac monitor.  I personally viewed and interpreted the cardiac monitored which showed an underlying rhythm of: Sinus rhythm   Medicines ordered and prescription drug management:  I ordered medication including ASA for concern of ACS Reevaluation of the patient after these medicines showed that the patient stayed the same I  have reviewed the patients home medicines and have made adjustments as needed   Consultations Obtained:  I requested consultation with the cardiology,  and discussed lab and imaging findings as well as pertinent plan - they recommend: Admission for stress test in the morning   Problem List / ED Course:  71 year old  male with known multivessel coronary artery disease, presents for intermittent symptoms of chest discomfort over the past 3 days.  He does report an exertional onset with relief at rest.  On arrival in the ED he is asymptomatic.  His vital signs are normal.  He is well-appearing on exam.  EKG shows redemonstration of chronic T wave inversions in anterior leads with no acute changes from prior EKGs.  Laboratory work-up was initiated.  Patient does take an 81 mg aspirin and and an additional 243 mg was given.  Patient had troponins of 5 and 5.  Given his known multivessel CAD with no recent provocative cardiac testing, I did consult with cardiology.  They did recommend admission to the hospital for stress test in the morning.  Patient remained asymptomatic during his observation time in the ED.   Reevaluation:  After the interventions noted above, I reevaluated the patient and found that they have :stayed the same   Social Determinants of Health:  Patient does have access to outpatient medical care and is followed by [facility].   Dispostion:  After consideration of the diagnostic results and the patients response to treatment, I feel that the patent would benefit from admission to hospital.          Final Clinical Impression(s) / ED Diagnoses Final diagnoses:  Chest discomfort    Rx / DC Orders ED Discharge Orders     None         Godfrey Pick, MD 02/08/21 0800

## 2021-02-07 NOTE — ED Notes (Signed)
ED Provider at bedside. 

## 2021-02-08 ENCOUNTER — Observation Stay (HOSPITAL_BASED_OUTPATIENT_CLINIC_OR_DEPARTMENT_OTHER): Payer: Medicare HMO

## 2021-02-08 DIAGNOSIS — Z20822 Contact with and (suspected) exposure to covid-19: Secondary | ICD-10-CM | POA: Diagnosis not present

## 2021-02-08 DIAGNOSIS — R079 Chest pain, unspecified: Secondary | ICD-10-CM

## 2021-02-08 DIAGNOSIS — I2583 Coronary atherosclerosis due to lipid rich plaque: Secondary | ICD-10-CM

## 2021-02-08 DIAGNOSIS — Z952 Presence of prosthetic heart valve: Secondary | ICD-10-CM

## 2021-02-08 DIAGNOSIS — I2510 Atherosclerotic heart disease of native coronary artery without angina pectoris: Secondary | ICD-10-CM | POA: Diagnosis not present

## 2021-02-08 DIAGNOSIS — R0789 Other chest pain: Secondary | ICD-10-CM | POA: Diagnosis not present

## 2021-02-08 DIAGNOSIS — E119 Type 2 diabetes mellitus without complications: Secondary | ICD-10-CM | POA: Diagnosis not present

## 2021-02-08 LAB — BASIC METABOLIC PANEL
Anion gap: 9 (ref 5–15)
BUN: 14 mg/dL (ref 8–23)
CO2: 23 mmol/L (ref 22–32)
Calcium: 9.1 mg/dL (ref 8.9–10.3)
Chloride: 106 mmol/L (ref 98–111)
Creatinine, Ser: 0.63 mg/dL (ref 0.61–1.24)
GFR, Estimated: >60 mL/min (ref >60)
Glucose, Bld: 80 mg/dL (ref 70–99)
Potassium: 3.9 mmol/L (ref 3.5–5.1)
Sodium: 138 mmol/L (ref 135–145)

## 2021-02-08 LAB — NM MYOCAR MULTI W/SPECT W/WALL MOTION / EF
LV dias vol: 67 mL (ref 62–150)
LV sys vol: 26 mL
Nuc Stress EF: 61 %
Peak HR: 106 {beats}/min
RATE: 0.3
Rest HR: 57 {beats}/min
Rest Nuclear Isotope Dose: 11 mCi
SDS: 2
SRS: 2
SSS: 4
ST Depression (mm): 0 mm
Stress Nuclear Isotope Dose: 33 mCi
TID: 0.76

## 2021-02-08 LAB — ECHOCARDIOGRAM COMPLETE
AR max vel: 1.74 cm2
AV Area VTI: 2.1 cm2
AV Area mean vel: 1.96 cm2
AV Mean grad: 9.5 mmHg
AV Peak grad: 23.7 mmHg
Ao pk vel: 2.44 m/s
Area-P 1/2: 1.84 cm2
Calc EF: 59.3 %
Height: 70 in
MV VTI: 1.74 cm2
S' Lateral: 2.5 cm
Single Plane A2C EF: 52.2 %
Single Plane A4C EF: 67.9 %
Weight: 2084.67 oz

## 2021-02-08 LAB — CBC
HCT: 45.1 % (ref 39.0–52.0)
Hemoglobin: 14.2 g/dL (ref 13.0–17.0)
MCH: 28.4 pg (ref 26.0–34.0)
MCHC: 31.5 g/dL (ref 30.0–36.0)
MCV: 90.2 fL (ref 80.0–100.0)
Platelets: 236 10*3/uL (ref 150–400)
RBC: 5 MIL/uL (ref 4.22–5.81)
RDW: 14.1 % (ref 11.5–15.5)
WBC: 9.9 10*3/uL (ref 4.0–10.5)
nRBC: 0 % (ref 0.0–0.2)

## 2021-02-08 LAB — HEMOGLOBIN A1C
Hgb A1c MFr Bld: 6 % — ABNORMAL HIGH (ref 4.8–5.6)
Mean Plasma Glucose: 126 mg/dL

## 2021-02-08 LAB — GLUCOSE, CAPILLARY: Glucose-Capillary: 94 mg/dL (ref 70–99)

## 2021-02-08 LAB — TROPONIN I (HIGH SENSITIVITY): Troponin I (High Sensitivity): 7 ng/L (ref <18)

## 2021-02-08 MED ORDER — SODIUM CHLORIDE FLUSH 0.9 % IV SOLN
INTRAVENOUS | Status: AC
  Administered 2021-02-08: 10 mL via INTRAVENOUS
  Filled 2021-02-08: qty 10

## 2021-02-08 MED ORDER — TECHNETIUM TC 99M TETROFOSMIN IV KIT
10.0000 | PACK | Freq: Once | INTRAVENOUS | Status: AC | PRN
  Administered 2021-02-08: 11 via INTRAVENOUS

## 2021-02-08 MED ORDER — TECHNETIUM TC 99M TETROFOSMIN IV KIT
30.0000 | PACK | Freq: Once | INTRAVENOUS | Status: AC | PRN
  Administered 2021-02-08: 33 via INTRAVENOUS

## 2021-02-08 MED ORDER — NITROGLYCERIN 0.4 MG SL SUBL: 0.4000 mg | SUBLINGUAL_TABLET | SUBLINGUAL | 12 refills | Status: DC | PRN

## 2021-02-08 MED ORDER — REGADENOSON 0.4 MG/5ML IV SOLN
INTRAVENOUS | Status: AC
  Administered 2021-02-08: 0.4 mg via INTRAVENOUS
  Filled 2021-02-08: qty 5

## 2021-02-08 MED ORDER — ASPIRIN EC 81 MG PO TBEC
81.0000 mg | DELAYED_RELEASE_TABLET | Freq: Once | ORAL | Status: AC
  Administered 2021-02-08: 81 mg via ORAL

## 2021-02-08 NOTE — Discharge Instructions (Signed)
Advised to follow-up with primary care physician in 1 week. Advised to follow-up with cardiology as scheduled. 

## 2021-02-08 NOTE — Progress Notes (Signed)
[name] showed nor evidence of ischemia and it is normal he is ok to be discharged

## 2021-02-08 NOTE — Progress Notes (Signed)
°  Transition of Care [facility]) Screening Note   Patient Details  Name: [name] Date of Birth: 08/18/1950   Transition of [facility] CM/SW Contact:    Villa Herb, LCSWA Phone Number: 02/08/2021, 1:05 PM    Transition of Care Department [facility]) has reviewed patient and no TOC needs have been identified at this time. We will continue to monitor patient advancement through interdisciplinary progression rounds. If new patient transition needs arise, please place a TOC consult.

## 2021-02-08 NOTE — Care Management Obs Status (Signed)
MEDICARE OBSERVATION STATUS NOTIFICATION   Patient Details  Name: [name] MRN: [id number] Date of Birth: [birth year]   Medicare Observation Status Notification Given:  Yes    Corey Harold 02/08/2021, 3:33 PM

## 2021-02-08 NOTE — Progress Notes (Incomplete)
*  PRELIMINARY RESULTS* Echocardiogram 2D Echocardiogram has been performed.  [name] 02/08/2021, 12:44 PM
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
## 2021-02-08 NOTE — Discharge Summary (Signed)
Physician Discharge Summary  [name] [id number]:[id number] DOB: 1950-04-26 DOA: 02/07/2021  PCP: Carylon Perches, MD  Admit date: 02/07/2021  Discharge date: 02/08/2021  Admitted From: Home Disposition:  Home  Recommendations for Outpatient Follow-up:  Follow up with PCP in 1-2 weeks Please obtain BMP/CBC in one week Advised to follow-up with cardiology as scheduled.  Home Health:None Equipment/Devices:None  Discharge Condition: Stable CODE STATUS:Full code Diet recommendation: Heart Healthy   Brief [facility] Course: This 71 years old male with PMH significant for aortic valve replacement, type 2 diabetes, coronary artery disease presented in the ED with complaints of chest pain for 2 to 3 days duration.  He describes the sensation of electric shocks on the left side of his chest, intermittent which resolves spontaneously in 1 to 2 hours.  Patient reports symptoms started when he was cutting woods.  Patient reports he is normally physically active.  He denies any difficulty breathing, palpitations or dizziness.  Patient was admitted for atypical chest pain.  troponin x3 negative . EKG without ischemic changes.  Cardiology was consulted , Patient had echocardiogram showed LVEF 65 to 70% no regional wall motion abnormalities.  Lexiscan stress test was done which shows no evidence of ischemia,  no evidence of infarction.  Patient is cleared from cardiology to be discharged,  advised to follow-up with as outpatient.   Discharge Diagnoses:  Principal Problem:   Chest pain Active Problems:   Diabetes mellitus without complication (HCC)   Coronary atherosclerosis   H/O aortic valve replacement   Unilateral AKA, left [facility])    Discharge Instructions  Discharge Instructions     Call MD for:  difficulty breathing, headache or visual disturbances   Complete by: As directed    Call MD for:  persistant dizziness or light-headedness   Complete by: As directed    Call MD for:   persistant nausea and vomiting   Complete by: As directed    Diet - low sodium heart healthy   Complete by: As directed    Diet Carb Modified   Complete by: As directed    Discharge instructions   Complete by: As directed    Advised to follow-up with primary care physician in 1 week. Advised to follow-up with cardiology as scheduled.   Increase activity slowly   Complete by: As directed       Allergies as of 02/08/2021   No Known Allergies      Medication List     TAKE these medications    aspirin EC 81 MG tablet Take 81 mg by mouth daily.   dicyclomine 20 MG tablet Commonly known as: BENTYL Take 1 tablet (20 mg total) by mouth 3 (three) times daily as needed for spasms.   HYDROcodone-acetaminophen 5-325 MG tablet Commonly known as: NORCO/VICODIN One tablet every four hours for pain.   linagliptin 5 MG Tabs tablet Commonly known as: TRADJENTA Take 5 mg by mouth daily.   lisinopril 5 MG tablet Commonly known as: ZESTRIL TAKE 1 TABLET(5 MG) BY MOUTH DAILY What changed: See the new instructions.   metFORMIN 500 MG tablet Commonly known as: GLUCOPHAGE Take 1 tablet by mouth 2 (two) times daily.   metoprolol tartrate 25 MG tablet Commonly known as: LOPRESSOR Take 0.5 tablets (12.5 mg total) by mouth 2 (two) times daily.   nitroGLYCERIN 0.4 MG SL tablet Commonly known as: NITROSTAT Place 1 tablet (0.4 mg total) under the tongue every 5 (five) minutes as needed for chest pain.   ondansetron 4  MG disintegrating tablet Commonly known as: Zofran ODT Take 1 tablet (4 mg total) by mouth every 8 (eight) hours as needed for nausea.   pantoprazole 40 MG tablet Commonly known as: PROTONIX Take 1 tablet (40 mg total) by mouth daily.   simvastatin 40 MG tablet Commonly known as: ZOCOR Take 40 mg by mouth at bedtime.   tamsulosin 0.4 MG Caps capsule Commonly known as: FLOMAX Take 0.4 mg by mouth daily.   Tyler Aas FlexTouch 100 UNIT/ML FlexTouch Pen Generic drug:  insulin degludec Inject 25 Units into the skin every morning.        Follow-up Information     Asencion Noble, MD Follow up in 1 week(s).   Specialty: Internal Medicine Contact information: [address] [place] [address] [phone]         Werner Lean, MD .   Specialty: Cardiology Contact information: [place] [address] [phone]                No Known Allergies  Consultations: Cardiology   Procedures/Studies: NM Myocar Multi W/Spect W/Wall Motion / EF  Result Date: 02/08/2021   T wave inversion noted in V1 through V3 at baseline and during infusion.   There were no arrhythmias during stress. There were no arrhythmias during recovery.   Diaphragmatic attenuation artifact was present. Image quality affected due to significant extracardiac activity.   LV perfusion is abnormal. There is no evidence of ischemia. There is no evidence of infarction. Defect 1: There is a medium defect with mild reduction in uptake present in the apical to basal inferior location(s) that is fixed. Consistent with artifact caused by bowel tracer uptake and diaphragmatic attenuation.   Left ventricular function is normal. Nuclear stress EF: 61 %. No evidence of transient ischemic dilation (TID) noted.   Findings are consistent with no prior ischemia. The study is low risk.   DG Chest Portable 1 View  Result Date: 02/07/2021 CLINICAL DATA:  Chest pain 3 days.  History of aortic stenosis. EXAM: PORTABLE CHEST 1 VIEW COMPARISON:  09/06/2018 FINDINGS: Aortic valve replacement. Heart size and vascularity normal. Negative for heart failure. Reticular markings throughout the lungs mostly in the lung bases compatible with chronic scarring. No acute infiltrate or effusion. IMPRESSION: Interstitial fibrosis particularly the bases. No superimposed acute abnormality. Postop aortic valve replacement Electronically Signed   By: Franchot Gallo M.D.   On:  02/07/2021 12:48   ECHOCARDIOGRAM COMPLETE  Result Date: 02/08/2021    ECHOCARDIOGRAM REPORT   Patient Name:   [name] Date of Exam: 02/08/2021 Medical Rec #:  [id number]        Height:       70.0 in Accession #:    [id number]       Weight:       130.3 lb Date of Birth:  July 08, 1950       BSA:          1.740 m Patient Age:    29 years         BP:           149/78 mmHg Patient Gender: M                HR:           61 bpm. Exam Location:  [facility] Procedure: 2D Echo, Cardiac Doppler and Color Doppler Indications:    Chest Pain  History:        Patient  has prior history of Echocardiogram examinations, most                 recent 11/11/2017. CAD, Signs/Symptoms:Chest Pain; Risk                 Factors:Diabetes and Dyslipidemia. There is a 23 mm Edwards                 pericardial tissue valve in the AV position.  Sonographer:    Wenda Low Referring Phys: [id number] [place]  1. Left ventricular ejection fraction, by estimation, is 65 to 70%. The left ventricle has normal function. The left ventricle has no regional wall motion abnormalities. There is mild concentric left ventricular hypertrophy. Left ventricular diastolic parameters are consistent with Grade I diastolic dysfunction (impaired relaxation). Elevated left ventricular end-diastolic pressure.  2. Right ventricular systolic function is normal. The right ventricular size is normal. There is mildly elevated pulmonary artery systolic pressure. The estimated right ventricular systolic pressure is [id number] mmHg.  3. The mitral valve is normal in structure. Trivial mitral valve regurgitation. No evidence of mitral stenosis.  4. The aortic valve is tricuspid. Aortic valve regurgitation is not visualized. No aortic stenosis is present.  5. Aortic dilatation noted. There is borderline dilatation of the aortic root, measuring 37 mm.  6. The inferior vena cava is normal in size with greater than 50% respiratory variability, suggesting  right atrial pressure of 3 mmHg. FINDINGS  Left Ventricle: Left ventricular ejection fraction, by estimation, is 65 to 70%. The left ventricle has normal function. The left ventricle has no regional wall motion abnormalities. The left ventricular internal cavity size was normal in size. There is  mild concentric left ventricular hypertrophy. Left ventricular diastolic parameters are consistent with Grade I diastolic dysfunction (impaired relaxation). Elevated left ventricular end-diastolic pressure. Right Ventricle: The right ventricular size is normal. No increase in right ventricular wall thickness. Right ventricular systolic function is normal. There is mildly elevated pulmonary artery systolic pressure. The tricuspid regurgitant velocity is 3.09  m/s, and with an assumed right atrial pressure of 3 mmHg, the estimated right ventricular systolic pressure is [id number] mmHg. Left Atrium: Left atrial size was normal in size. Right Atrium: Right atrial size was normal in size. Pericardium: There is no evidence of pericardial effusion. Mitral Valve: The mitral valve is normal in structure. Mild mitral annular calcification. Trivial mitral valve regurgitation. No evidence of mitral valve stenosis. MV peak gradient, 12.1 mmHg. The mean mitral valve gradient is 3.0 mmHg. Tricuspid Valve: The tricuspid valve is normal in structure. Tricuspid valve regurgitation is trivial. No evidence of tricuspid stenosis. Aortic Valve: The aortic valve is tricuspid. There is moderate aortic valve annular calcification. Aortic valve regurgitation is not visualized. No aortic stenosis is present. Aortic valve mean gradient measures 9.5 mmHg. Aortic valve peak gradient measures 23.7 mmHg. Aortic valve area, by VTI measures 2.10 cm. Pulmonic Valve: The pulmonic valve was normal in structure. Pulmonic valve regurgitation is not visualized. No evidence of pulmonic stenosis. Aorta: Aortic dilatation noted. There is borderline dilatation of the  aortic root, measuring 37 mm. Venous: The inferior vena cava is normal in size with greater than 50% respiratory variability, suggesting right atrial pressure of 3 mmHg. IAS/Shunts: No atrial level shunt detected by color flow Doppler.  LEFT VENTRICLE PLAX 2D LVIDd:         3.90 cm     Diastology LVIDs:         2.50 cm  LV e' medial:    3.81 cm/s LV PW:         1.30 cm     LV E/e' medial:  23.9 LV IVS:        1.20 cm     LV e' lateral:   8.81 cm/s LVOT diam:     2.00 cm     LV E/e' lateral: 10.4 LV SV:         81 LV SV Index:   47 LVOT Area:     3.14 cm  LV Volumes (MOD) LV vol d, MOD A2C: 34.3 ml LV vol d, MOD A4C: 38.6 ml LV vol s, MOD A2C: 16.4 ml LV vol s, MOD A4C: 12.4 ml LV SV MOD A2C:     17.9 ml LV SV MOD A4C:     38.6 ml LV SV MOD BP:      21.7 ml RIGHT VENTRICLE RV Basal diam:  3.05 cm RV Mid diam:    3.20 cm RV S prime:     8.81 cm/s LEFT ATRIUM           Index        RIGHT ATRIUM           Index LA diam:      4.00 cm 2.30 cm/m   RA Area:     13.70 cm LA Vol (A2C): 22.5 ml 12.93 ml/m  RA Volume:   34.10 ml  19.60 ml/m LA Vol (A4C): 52.4 ml 30.12 ml/m  AORTIC VALVE                     PULMONIC VALVE AV Area (Vmax):    1.74 cm      PV Vmax:       0.80 m/s AV Area (Vmean):   1.96 cm      PV Peak grad:  2.5 mmHg AV Area (VTI):     2.10 cm AV Vmax:           243.50 cm/s AV Vmean:          134.500 cm/s AV VTI:            0.388 m AV Peak Grad:      23.7 mmHg AV Mean Grad:      9.5 mmHg LVOT Vmax:         135.00 cm/s LVOT Vmean:        83.900 cm/s LVOT VTI:          0.259 m LVOT/AV VTI ratio: 0.67  AORTA Ao Root diam: 3.70 cm Ao Asc diam:  2.90 cm MITRAL VALVE                TRICUSPID VALVE MV Area (PHT): 1.84 cm     TR Peak grad:   38.2 mmHg MV Area VTI:   1.74 cm     TR Vmax:        309.00 cm/s MV Peak grad:  12.1 mmHg MV Mean grad:  3.0 mmHg     SHUNTS MV Vmax:       1.74 m/s     Systemic VTI:  0.26 m MV Vmean:      79.7 cm/s    Systemic Diam: 2.00 cm MV Decel Time: 412 msec MV E velocity: 91.20  cm/s MV A velocity: 141.00 cm/s MV E/A ratio:  0.65 Fransico Him MD Electronically signed by Fransico Him MD Signature Date/Time: 02/08/2021/1:07:37 PM    Final     Echocardiogram,  Lexiscan stress test.  Subjective: Patient was seen and examined at bedside.  Overnight events noted.   Patient reports feeling much improved.  Denies any chest pain.   Stress test and echocardiogram unremarkable.  Patient is being discharged home.  Discharge Exam: Vitals:   02/08/21 0434 02/08/21 1323  BP: 118/61 129/83  Pulse: 75 64  Resp: 20 20  Temp: 97.8 F (36.6 C) 97.7 F (36.5 C)  SpO2: 97% 98%   Vitals:   02/07/21 2055 02/08/21 0023 02/08/21 0434 02/08/21 1323  BP:  130/72 118/61 129/83  Pulse:  64 75 64  Resp:  20 20 20   Temp:  98 F (36.7 C) 97.8 F (36.6 C) 97.7 F (36.5 C)  TempSrc:  Oral Oral Oral  SpO2:  95% 97% 98%  Weight: 59.1 kg     Height: 5\' 10"  (1.778 m)       General: Pt is alert, awake, not in acute distress Cardiovascular: RRR, S1/S2 +, no rubs, no gallops Respiratory: CTA bilaterally, no wheezing, no rhonchi Abdominal: Soft, NT, ND, bowel sounds + Extremities: Left AKA, no edema, no cyanosis, no clubbing    The results of significant diagnostics from this hospitalization (including imaging, microbiology, ancillary and laboratory) are listed below for reference.     Microbiology: Recent Results (from the past 240 hour(s))  Resp Panel by RT-PCR (Flu A&B, Covid) Nasopharyngeal Swab     Status: None   Collection Time: 02/07/21  3:48 PM   Specimen: Nasopharyngeal Swab; Nasopharyngeal(NP) swabs in vial transport medium  Result Value Ref Range Status   SARS Coronavirus 2 by RT PCR NEGATIVE NEGATIVE Final    Comment: (NOTE) SARS-CoV-2 target nucleic acids are NOT DETECTED.  The SARS-CoV-2 RNA is generally detectable in upper respiratory specimens during the acute phase of infection. The lowest concentration of SARS-CoV-2 viral copies this assay can detect is 138  copies/mL. A negative result does not preclude SARS-Cov-2 infection and should not be used as the sole basis for treatment or other patient management decisions. A negative result may occur with  improper specimen collection/handling, submission of specimen other than nasopharyngeal swab, presence of viral mutation(s) within the areas targeted by this assay, and inadequate number of viral copies(<138 copies/mL). A negative result must be combined with clinical observations, patient history, and epidemiological information. The expected result is Negative.  Fact Sheet for Patients:  EntrepreneurPulse.com.au  Fact Sheet for Healthcare Providers:  IncredibleEmployment.be  This test is no t yet approved or cleared by the [place] FDA and  has been authorized for detection and/or diagnosis of SARS-CoV-2 by FDA under an Emergency Use Authorization (EUA). This EUA will remain  in effect (meaning this test can be used) for the duration of the COVID-19 declaration under Section 564(b)(1) of the Act, 21 U.S.C.section 360bbb-3(b)(1), unless the authorization is terminated  or revoked sooner.       Influenza A by PCR NEGATIVE NEGATIVE Final   Influenza B by PCR NEGATIVE NEGATIVE Final    Comment: (NOTE) The Xpert Xpress SARS-CoV-2/FLU/RSV plus assay is intended as an aid in the diagnosis of influenza from Nasopharyngeal swab specimens and should not be used as a sole basis for treatment. Nasal washings and aspirates are unacceptable for Xpert Xpress SARS-CoV-2/FLU/RSV testing.  Fact Sheet for Patients: EntrepreneurPulse.com.au  Fact Sheet for Healthcare Providers: IncredibleEmployment.be  This test is not yet approved or cleared by the [place] FDA and has been authorized for detection and/or diagnosis of SARS-CoV-2 by FDA under an Emergency  Use Authorization (EUA). This EUA will remain in effect (meaning  this test can be used) for the duration of the COVID-19 declaration under Section 564(b)(1) of the Act, 21 U.S.C. section 360bbb-3(b)(1), unless the authorization is terminated or revoked.  Performed at [facility], [address] [place], [place] [address]      Labs: BNP (last 3 results) No results for input(s): BNP in the last 8760 hours. Basic Metabolic Panel: Recent Labs  Lab 02/07/21 1303 02/08/21 0355  NA 138 138  K 4.6 3.9  CL 107 106  CO2 28 23  GLUCOSE 91 80  BUN 13 14  CREATININE 0.83 0.63  CALCIUM 8.9 9.1  MG 1.9  --    Liver Function Tests: Recent Labs  Lab 02/07/21 1303  AST 14*  ALT 12  ALKPHOS 57  BILITOT 0.3  PROT 6.8  ALBUMIN 3.6   No results for input(s): LIPASE, AMYLASE in the last 168 hours. No results for input(s): AMMONIA in the last 168 hours. CBC: Recent Labs  Lab 02/07/21 1303 02/08/21 0355  WBC 9.3 9.9  NEUTROABS 5.8  --   HGB 13.7 14.2  HCT 42.5 45.1  MCV 92.2 90.2  PLT 184 236   Cardiac Enzymes: No results for input(s): CKTOTAL, CKMB, CKMBINDEX, TROPONINI in the last 168 hours. BNP: Invalid input(s): POCBNP CBG: Recent Labs  Lab 02/07/21 2139 02/08/21 0615  GLUCAP 91 94   D-Dimer No results for input(s): DDIMER in the last 72 hours. Hgb A1c No results for input(s): HGBA1C in the last 72 hours. Lipid Profile No results for input(s): CHOL, HDL, LDLCALC, TRIG, CHOLHDL, LDLDIRECT in the last 72 hours. Thyroid function studies No results for input(s): TSH, T4TOTAL, T3FREE, THYROIDAB in the last 72 hours.  Invalid input(s): FREET3 Anemia work up No results for input(s): VITAMINB12, FOLATE, FERRITIN, TIBC, IRON, RETICCTPCT in the last 72 hours. Urinalysis    Component Value Date/Time   COLORURINE YELLOW 07/15/2020 [place] 07/15/2020 1423   LABSPEC 1.034 (H) 07/15/2020 1423   PHURINE 7.0 07/15/2020 [place] 07/15/2020 1423   GLUCOSEU neg 02/03/2009 1250   HGBUR NEGATIVE  07/15/2020 [place] 07/15/2020 1423   KETONESUR 20 (A) 07/15/2020 1423   PROTEINUR 30 (A) 07/15/2020 1423   NITRITE NEGATIVE 07/15/2020 1423   LEUKOCYTESUR NEGATIVE 07/15/2020 1423   Sepsis Labs Invalid input(s): PROCALCITONIN,  WBC,  LACTICIDVEN Microbiology Recent Results (from the past 240 hour(s))  Resp Panel by RT-PCR (Flu A&B, Covid) Nasopharyngeal Swab     Status: None   Collection Time: 02/07/21  3:48 PM   Specimen: Nasopharyngeal Swab; Nasopharyngeal(NP) swabs in vial transport medium  Result Value Ref Range Status   SARS Coronavirus 2 by RT PCR NEGATIVE NEGATIVE Final    Comment: (NOTE) SARS-CoV-2 target nucleic acids are NOT DETECTED.  The SARS-CoV-2 RNA is generally detectable in upper respiratory specimens during the acute phase of infection. The lowest concentration of SARS-CoV-2 viral copies this assay can detect is 138 copies/mL. A negative result does not preclude SARS-Cov-2 infection and should not be used as the sole basis for treatment or other patient management decisions. A negative result may occur with  improper specimen collection/handling, submission of specimen other than nasopharyngeal swab, presence of viral mutation(s) within the areas targeted by this assay, and inadequate number of viral copies(<138 copies/mL). A negative result must be combined with clinical observations, patient history, and epidemiological information. The expected result is Negative.  Fact Sheet  for Patients:  EntrepreneurPulse.com.au  Fact Sheet for Healthcare Providers:  IncredibleEmployment.be  This test is no t yet approved or cleared by the [place] FDA and  has been authorized for detection and/or diagnosis of SARS-CoV-2 by FDA under an Emergency Use Authorization (EUA). This EUA will remain  in effect (meaning this test can be used) for the duration of the COVID-19 declaration under Section 564(b)(1) of the  Act, 21 U.S.C.section 360bbb-3(b)(1), unless the authorization is terminated  or revoked sooner.       Influenza A by PCR NEGATIVE NEGATIVE Final   Influenza B by PCR NEGATIVE NEGATIVE Final    Comment: (NOTE) The Xpert Xpress SARS-CoV-2/FLU/RSV plus assay is intended as an aid in the diagnosis of influenza from Nasopharyngeal swab specimens and should not be used as a sole basis for treatment. Nasal washings and aspirates are unacceptable for Xpert Xpress SARS-CoV-2/FLU/RSV testing.  Fact Sheet for Patients: EntrepreneurPulse.com.au  Fact Sheet for Healthcare Providers: IncredibleEmployment.be  This test is not yet approved or cleared by the [place] FDA and has been authorized for detection and/or diagnosis of SARS-CoV-2 by FDA under an Emergency Use Authorization (EUA). This EUA will remain in effect (meaning this test can be used) for the duration of the COVID-19 declaration under Section 564(b)(1) of the Act, 21 U.S.C. section 360bbb-3(b)(1), unless the authorization is terminated or revoked.  Performed at [facility], [address], [place], [place] [address]      Time coordinating discharge: Over 30 minutes  SIGNED:   Shawna Clamp, MD  [facility] 02/08/2021, 3:40 PM Pager   If 7PM-7AM, please contact night-coverage

## 2021-03-09 ENCOUNTER — Ambulatory Visit (INDEPENDENT_AMBULATORY_CARE_PROVIDER_SITE_OTHER): Payer: Self-pay | Admitting: *Deleted

## 2021-03-09 ENCOUNTER — Other Ambulatory Visit: Payer: Self-pay

## 2021-03-09 VITALS — Ht 70.0 in | Wt 147.0 lb

## 2021-03-09 DIAGNOSIS — Z86010 Personal history of colonic polyps: Secondary | ICD-10-CM

## 2021-03-09 NOTE — Progress Notes (Addendum)
Gastroenterology Pre-Procedure Review  Request Date: 03/09/2021 Requesting Physician: Dr. Ouida Sills, Last TCS done 04/29/2003 by Dr. Jena Gauss, small pedunculated polyp 20 cm, left sided diverticula, no path in Epic   PATIENT REVIEW QUESTIONS: The patient responded to the following health history questions as indicated:    1. Diabetes Melitis: yes, type II 2. Joint replacements in the past 12 months: no 3. Major health problems in the past 3 months: no 4. Has an artificial valve or MVP: yes, done 2020 5. Has a defibrillator: no 6. Has been advised in past to take antibiotics in advance of a procedure like teeth cleaning: yes 7. Family history of colon cancer: no  8. Alcohol Use: no 9. 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 History of sleep apnea: no  11. History of coronary artery or other vascular stents placed within the last 12 months: no 12. History of any prior anesthesia complications: no 13. Body mass index is 21.09 kg/m.    MEDICATIONS & ALLERGIES:    Patient reports the following regarding taking any blood thinners:   Plavix? no Aspirin? Yes, 81 mg daily Coumadin? no Brilinta? no Xarelto? no Eliquis? no Pradaxa? no Savaysa? no Effient? no  Patient confirms/reports the following medications:  Current Outpatient Medications  Medication Sig Dispense Refill   aspirin EC 81 MG tablet Take 81 mg by mouth daily.     linagliptin (TRADJENTA) 5 MG TABS tablet Take 5 mg by mouth daily.     lisinopril (ZESTRIL) 5 MG tablet TAKE 1 TABLET(5 MG) BY MOUTH DAILY (Patient taking differently: Take 5 mg by mouth daily.) 90 tablet 1   metFORMIN (GLUCOPHAGE) 500 MG tablet Take 1 tablet by mouth 2 (two) times daily.     metoprolol tartrate (LOPRESSOR) 25 MG tablet Take 0.5 tablets (12.5 mg total) by mouth 2 (two) times daily. 60 tablet 0   nitroGLYCERIN (NITROSTAT) 0.4 MG SL tablet Place 1 tablet (0.4 mg total) under the tongue every 5 (five) minutes as needed  for chest pain. 30 tablet 12   pantoprazole (PROTONIX) 40 MG tablet Take 1 tablet (40 mg total) by mouth daily. (Patient taking differently: Take 40 mg by mouth as needed.) 30 tablet 0   simvastatin (ZOCOR) 40 MG tablet Take 40 mg by mouth at bedtime.     tamsulosin (FLOMAX) 0.4 MG CAPS capsule Take 0.4 mg by mouth daily.     TRESIBA FLEXTOUCH 100 UNIT/ML SOPN FlexTouch Pen Inject 25 Units into the skin every morning.     No current facility-administered medications for this visit.    Patient confirms/reports the following allergies:  No Known Allergies  No orders of the defined types were placed in this encounter.   AUTHORIZATION INFORMATION Primary Insurance: [facility],  ID #: [id number],  Group #: [id number] Pre-Cert / Berkley Harvey required:  Pre-Cert / Auth #:   Secondary Insurance: Medicaid [place] [employer],  ID #: [id number] Pre-Cert / Berkley Harvey required: No, not required  SCHEDULE INFORMATION: Procedure has been scheduled as follows:  Date: , Time:   Location: [facility] with Dr. Jena Gauss  This Gastroenterology Pre-Precedure Review Form is being routed to the following provider(s): Ermalinda Memos, PA-C

## 2021-03-09 NOTE — Progress Notes (Signed)
Medical history meets ASA III + criteria. Needs OV to discuss scheduling colonoscopy.

## 2021-03-10 NOTE — Progress Notes (Signed)
Spoke to pt and informed him due to medical hx, that an ov to arrange TCS is required.  He voiced understanding.  Scheduled ov for 03/30/2021 at 2:30.

## 2021-03-30 ENCOUNTER — Encounter: Payer: Self-pay | Admitting: Gastroenterology

## 2021-03-30 ENCOUNTER — Other Ambulatory Visit: Payer: Self-pay

## 2021-03-30 ENCOUNTER — Ambulatory Visit (INDEPENDENT_AMBULATORY_CARE_PROVIDER_SITE_OTHER): Payer: Medicare HMO | Admitting: Gastroenterology

## 2021-03-30 DIAGNOSIS — Z9889 Other specified postprocedural states: Secondary | ICD-10-CM | POA: Diagnosis not present

## 2021-03-30 DIAGNOSIS — Z86010 Personal history of colonic polyps: Secondary | ICD-10-CM
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
## 2021-03-30 NOTE — Patient Instructions (Signed)
Colonoscopy to be scheduled. See separate instructions.  ?

## 2021-04-05 ENCOUNTER — Observation Stay (HOSPITAL_COMMUNITY)
Admission: EM | Admit: 2021-04-05 | Discharge: 2021-04-06 | Disposition: A | Discharge status: Home / Self Care | Payer: Medicare HMO | Attending: Internal Medicine | Admitting: Internal Medicine

## 2021-04-05 ENCOUNTER — Emergency Department (HOSPITAL_COMMUNITY): Payer: Medicare HMO

## 2021-04-05 ENCOUNTER — Encounter (HOSPITAL_COMMUNITY): Payer: Self-pay | Admitting: *Deleted

## 2021-04-05 ENCOUNTER — Other Ambulatory Visit: Payer: Self-pay

## 2021-04-05 DIAGNOSIS — Z955 Presence of coronary angioplasty implant and graft: Secondary | ICD-10-CM | POA: Insufficient documentation

## 2021-04-05 DIAGNOSIS — E782 Mixed hyperlipidemia: Secondary | ICD-10-CM | POA: Diagnosis present

## 2021-04-05 DIAGNOSIS — Z7982 Long term (current) use of aspirin: Secondary | ICD-10-CM | POA: Insufficient documentation

## 2021-04-05 DIAGNOSIS — R112 Nausea with vomiting, unspecified: Secondary | ICD-10-CM | POA: Diagnosis present

## 2021-04-05 DIAGNOSIS — I2510 Atherosclerotic heart disease of native coronary artery without angina pectoris: Secondary | ICD-10-CM | POA: Diagnosis not present

## 2021-04-05 DIAGNOSIS — E119 Type 2 diabetes mellitus without complications: Secondary | ICD-10-CM

## 2021-04-05 DIAGNOSIS — K8020 Calculus of gallbladder without cholecystitis without obstruction: Secondary | ICD-10-CM | POA: Diagnosis not present

## 2021-04-05 DIAGNOSIS — Z20822 Contact with and (suspected) exposure to covid-19: Secondary | ICD-10-CM | POA: Insufficient documentation

## 2021-04-05 DIAGNOSIS — Z87891 Personal history of nicotine dependence: Secondary | ICD-10-CM | POA: Insufficient documentation

## 2021-04-05 DIAGNOSIS — N179 Acute kidney failure, unspecified: Secondary | ICD-10-CM | POA: Diagnosis not present

## 2021-04-05 DIAGNOSIS — K219 Gastro-esophageal reflux disease without esophagitis: Secondary | ICD-10-CM | POA: Diagnosis present

## 2021-04-05 DIAGNOSIS — Z7984 Long term (current) use of oral hypoglycemic drugs: Secondary | ICD-10-CM | POA: Insufficient documentation

## 2021-04-05 DIAGNOSIS — E860 Dehydration: Secondary | ICD-10-CM | POA: Diagnosis not present

## 2021-04-05 DIAGNOSIS — D72829 Elevated white blood cell count, unspecified: Secondary | ICD-10-CM | POA: Diagnosis not present

## 2021-04-05 DIAGNOSIS — E785 Hyperlipidemia, unspecified: Secondary | ICD-10-CM | POA: Diagnosis present

## 2021-04-05 DIAGNOSIS — R2689 Other abnormalities of gait and mobility: Secondary | ICD-10-CM | POA: Insufficient documentation

## 2021-04-05 DIAGNOSIS — R1110 Vomiting, unspecified: Secondary | ICD-10-CM | POA: Diagnosis present

## 2021-04-05 LAB — CBC WITH DIFFERENTIAL/PLATELET
Abs Immature Granulocytes: 0.25 10*3/uL — ABNORMAL HIGH (ref 0.00–0.07)
Basophils Absolute: 0.1 10*3/uL (ref 0.0–0.1)
Basophils Relative: 0 %
Eosinophils Absolute: 0 10*3/uL (ref 0.0–0.5)
Eosinophils Relative: 0 %
HCT: 54.3 % — ABNORMAL HIGH (ref 39.0–52.0)
Hemoglobin: 17.8 g/dL — ABNORMAL HIGH (ref 13.0–17.0)
Immature Granulocytes: 1 %
Lymphocytes Relative: 11 %
Lymphs Abs: 2.1 10*3/uL (ref 0.7–4.0)
MCH: 28.7 pg (ref 26.0–34.0)
MCHC: 32.8 g/dL (ref 30.0–36.0)
MCV: 87.4 fL (ref 80.0–100.0)
Monocytes Absolute: 1.9 10*3/uL — ABNORMAL HIGH (ref 0.1–1.0)
Monocytes Relative: 10 %
Neutro Abs: 14.6 10*3/uL — ABNORMAL HIGH (ref 1.7–7.7)
Neutrophils Relative %: 78 %
Platelets: 278 10*3/uL (ref 150–400)
RBC: 6.21 MIL/uL — ABNORMAL HIGH (ref 4.22–5.81)
RDW: 14.7 % (ref 11.5–15.5)
WBC: 18.9 10*3/uL — ABNORMAL HIGH (ref 4.0–10.5)
nRBC: 0 % (ref 0.0–0.2)

## 2021-04-05 LAB — COMPREHENSIVE METABOLIC PANEL
ALT: 19 U/L (ref 0–44)
AST: 20 U/L (ref 15–41)
Albumin: 4.3 g/dL (ref 3.5–5.0)
Alkaline Phosphatase: 84 U/L (ref 38–126)
Anion gap: 14 (ref 5–15)
BUN: 41 mg/dL — ABNORMAL HIGH (ref 8–23)
CO2: 27 mmol/L (ref 22–32)
Calcium: 9 mg/dL (ref 8.9–10.3)
Chloride: 90 mmol/L — ABNORMAL LOW (ref 98–111)
Creatinine, Ser: 1.3 mg/dL — ABNORMAL HIGH (ref 0.61–1.24)
GFR, Estimated: 59 mL/min — ABNORMAL LOW (ref >60)
Glucose, Bld: 205 mg/dL — ABNORMAL HIGH (ref 70–99)
Potassium: 3.5 mmol/L (ref 3.5–5.1)
Sodium: 131 mmol/L — ABNORMAL LOW (ref 135–145)
Total Bilirubin: 1 mg/dL (ref 0.3–1.2)
Total Protein: 8.2 g/dL — ABNORMAL HIGH (ref 6.5–8.1)

## 2021-04-05 LAB — URINALYSIS, ROUTINE W REFLEX MICROSCOPIC
Bilirubin Urine: NEGATIVE
Glucose, UA: 50 mg/dL — AB
Hgb urine dipstick: NEGATIVE
Ketones, ur: 5 mg/dL — AB
Leukocytes,Ua: NEGATIVE
Nitrite: NEGATIVE
Protein, ur: 30 mg/dL — AB
Specific Gravity, Urine: 1.026 (ref 1.005–1.030)
pH: 5 (ref 5.0–8.0)

## 2021-04-05 MED ORDER — LACTATED RINGERS IV BOLUS
1000.0000 mL | Freq: Once | INTRAVENOUS | Status: AC
  Administered 2021-04-05: 1000 mL via INTRAVENOUS

## 2021-04-05 MED ORDER — ONDANSETRON HCL 4 MG/2ML IJ SOLN
4.0000 mg | Freq: Once | INTRAMUSCULAR | Status: AC
  Administered 2021-04-05: 4 mg via INTRAVENOUS
  Filled 2021-04-05: qty 2

## 2021-04-05 NOTE — ED Provider Notes (Signed)
?[facility] ?Provider Note ? ? ?CSN: [id number] ?Arrival date & time: 04/05/21  1210 ? ?  ? ?History ? ?Chief Complaint  ?Patient presents with  ? Emesis  ? ? ?[name] is a 71 y.o. male. ? ?Pt complains of vomiting and diarrhea.  Pt reports unable to tolerate fluids today.  Pt has a history of colon cancer.  Pt is scheduled to have a colonoscopy with Gi.  Pt denies any abdominal pain  ? ?The history is provided by the patient. No language interpreter was used.  ?Emesis ?Severity:  Moderate ?Duration:  3 days ?Timing:  Constant ?Number of daily episodes:  Multiple ?Able to tolerate:  Liquids ?Progression:  Worsening ?Recent urination:  Decreased ?Relieved by:  Nothing ?Worsened by:  Nothing ?Ineffective treatments:  None tried ?Associated symptoms: no abdominal pain   ?Risk factors: no alcohol use and no suspect food intake   ? ?  ? ?Home Medications ?Prior to Admission medications   ?Medication Sig Start Date End Date Taking? Authorizing Provider  ?aspirin EC 81 MG tablet Take 81 mg by mouth daily.    [provider]  ?linagliptin (TRADJENTA) 5 MG TABS tablet Take 5 mg by mouth daily.    [provider]  ?lisinopril (ZESTRIL) 5 MG tablet TAKE 1 TABLET(5 MG) BY MOUTH DAILY ?Patient taking differently: Take 5 mg by mouth daily. 10/19/20   Strader, Fransisco Hertz, PA-C  ?metFORMIN (GLUCOPHAGE) 500 MG tablet Take 1 tablet by mouth 2 (two) times daily. 03/31/20   [provider]  ?metoprolol tartrate (LOPRESSOR) 25 MG tablet Take 0.5 tablets (12.5 mg total) by mouth 2 (two) times daily. 11/13/17   Cherene Altes, MD  ?nitroGLYCERIN (NITROSTAT) 0.4 MG SL tablet Place 1 tablet (0.4 mg total) under the tongue every 5 (five) minutes as needed for chest pain. 02/08/21   Shawna Clamp, MD  ?pantoprazole (PROTONIX) 40 MG tablet Take 1 tablet (40 mg total) by mouth daily. ?Patient taking differently: Take 40 mg by mouth as needed. 04/16/20   Long, Wonda Olds, MD  ?simvastatin  (ZOCOR) 40 MG tablet Take 40 mg by mouth at bedtime.    [provider]  ?tamsulosin (FLOMAX) 0.4 MG CAPS capsule Take 0.4 mg by mouth daily.    [provider]  ?TRESIBA FLEXTOUCH 100 UNIT/ML SOPN FlexTouch Pen Inject 25 Units into the skin every morning. 12/05/17   [provider]  ?   ? ?Allergies    ?Patient has no known allergies.   ? ?Review of Systems   ?Review of Systems  ?Gastrointestinal:  Positive for vomiting. Negative for abdominal pain.  ?All other systems reviewed and are negative. ? ?Physical Exam ?Updated Vital Signs ?BP 114/85   Pulse 98   Temp 97.9 ?F (36.6 ?C) (Oral)   Resp (!) 22   Ht 5\' 10"  (1.778 m)   Wt 66.7 kg   SpO2 95%   BMI 21.09 kg/m?  ?Physical Exam ?Vitals and nursing note reviewed.  ?Constitutional:   ?   Appearance: Normal appearance. He is well-developed.  ?HENT:  ?   Head: Normocephalic.  ?Eyes:  ?   Pupils: Pupils are equal, round, and reactive to light.  ?Cardiovascular:  ?   Rate and Rhythm: Normal rate and regular rhythm.  ?   Pulses: Normal pulses.  ?Pulmonary:  ?   Effort: Pulmonary effort is normal.  ?Abdominal:  ?   General: Abdomen is flat. There is no distension.  ?Musculoskeletal:     ?  General: Normal range of motion.  ?   Cervical back: Normal range of motion.  ?Skin: ?   General: Skin is warm.  ?Neurological:  ?   General: No focal deficit present.  ?   Mental Status: He is alert and oriented to person, place, and time.  ?Psychiatric:     ?   Mood and Affect: Mood normal.  ? ? ?ED Results / Procedures / Treatments   ?Labs ?(all labs ordered are listed, but only abnormal results are displayed) ?Labs Reviewed  ?CBC WITH DIFFERENTIAL/PLATELET - Abnormal; Notable for the following components:  ?    Result Value  ? WBC 18.9 (*)   ? RBC 6.21 (*)   ? Hemoglobin 17.8 (*)   ? HCT 54.3 (*)   ? Neutro Abs 14.6 (*)   ? Monocytes Absolute 1.9 (*)   ? Abs Immature Granulocytes 0.25 (*)   ? All other components within normal limits  ?COMPREHENSIVE  METABOLIC PANEL - Abnormal; Notable for the following components:  ? Sodium 131 (*)   ? Chloride 90 (*)   ? Glucose, Bld 205 (*)   ? BUN 41 (*)   ? Creatinine, Ser 1.30 (*)   ? Total Protein 8.2 (*)   ? GFR, Estimated 59 (*)   ? All other components within normal limits  ?URINALYSIS, ROUTINE W REFLEX MICROSCOPIC  ? ? ?EKG ?None ? ?Radiology ?No results found. ? ?Procedures ?Procedures  ? ? ?Medications Ordered in ED ?Medications  ?ondansetron (ZOFRAN) injection 4 mg (4 mg Intravenous Given 04/05/21 1411)  ?lactated ringers bolus 1,000 mL (1,000 mLs Intravenous New Bag/Given 04/05/21 1410)  ? ? ?ED Course/ Medical Decision Making/ A&P ?  ?                        ?Medical Decision Making ?Pt reports vomiting today.  Pt reports he has had a high heart rate in the past  ? ?Problems Addressed: ?Dehydration: acute illness or injury ? ?Amount and/or Complexity of Data Reviewed ?External Data Reviewed: notes. ?Labs: ordered. Decision-making details documented in ED Course. ?   Details: Pt has an elevated wbc count of 18.6  BUn and creatine are elevated to 41 and 1.30. ?Radiology: ordered and independent interpretation performed. Decision-making details documented in ED Course. ?   Details: Ct scan no acute changes ?Discussion of management or test interpretation with external provider(s): Hospitalitis consulted.  Dr. Denton Brick request that pt have a ct scan due to elevated wbc count.  She request  replace consult.  ? ?Risk ?Prescription drug management. ?Decision regarding hospitalization. ?Risk Details: Pt given 2 liters Iv fluids.  Pt feels better.  ? ? ? ? ? ? ? ? ? ? ? ?Pt's care turned over to Dr. Sedonia Small at [facility] consult for admission pending  ?Final Clinical Impression(s) / ED Diagnoses ?Final diagnoses:  ?Dehydration  ?Nausea vomiting and diarrhea  ?Acute kidney injury ([place])  ? ? ?Rx / DC Orders ?ED Discharge Orders   ? ? None  ? ?  ? ? ?  ?Fransico Meadow, [place] ?04/06/21 2013 ? ?  ?Luna Fuse,  MD ?04/16/21 1648 ? ?

## 2021-04-05 NOTE — ED Notes (Signed)
Vomiting x 3 days

## 2021-04-05 NOTE — ED Notes (Signed)
Ice chips tolerated. Still unable to void in urinal. Bed side commode placed at bedside per patient request. No active vomiting. No complaints of pain. No signs of distress.  ?

## 2021-04-06 DIAGNOSIS — K219 Gastro-esophageal reflux disease without esophagitis: Secondary | ICD-10-CM | POA: Diagnosis not present

## 2021-04-06 DIAGNOSIS — R112 Nausea with vomiting, unspecified: Secondary | ICD-10-CM

## 2021-04-06 DIAGNOSIS — D72829 Elevated white blood cell count, unspecified: Secondary | ICD-10-CM

## 2021-04-06 DIAGNOSIS — E119 Type 2 diabetes mellitus without complications: Secondary | ICD-10-CM | POA: Diagnosis not present

## 2021-04-06 DIAGNOSIS — R1110 Vomiting, unspecified: Secondary | ICD-10-CM

## 2021-04-06 DIAGNOSIS — N179 Acute kidney failure, unspecified: Secondary | ICD-10-CM

## 2021-04-06 DIAGNOSIS — R197 Diarrhea, unspecified: Secondary | ICD-10-CM

## 2021-04-06 DIAGNOSIS — E785 Hyperlipidemia, unspecified: Secondary | ICD-10-CM | POA: Diagnosis not present

## 2021-04-06 DIAGNOSIS — E860 Dehydration: Secondary | ICD-10-CM

## 2021-04-06 LAB — GLUCOSE, CAPILLARY
Glucose-Capillary: 123 mg/dL — ABNORMAL HIGH (ref 70–99)
Glucose-Capillary: 114 mg/dL — ABNORMAL HIGH (ref 70–99)
Glucose-Capillary: 206 mg/dL — ABNORMAL HIGH (ref 70–99)
Glucose-Capillary: 74 mg/dL (ref 70–99)

## 2021-04-06 LAB — COMPREHENSIVE METABOLIC PANEL
ALT: 15 U/L (ref 0–44)
AST: 16 U/L (ref 15–41)
Albumin: 3.1 g/dL — ABNORMAL LOW (ref 3.5–5.0)
Alkaline Phosphatase: 59 U/L (ref 38–126)
Anion gap: 9 (ref 5–15)
BUN: 29 mg/dL — ABNORMAL HIGH (ref 8–23)
CO2: 27 mmol/L (ref 22–32)
Calcium: 7.9 mg/dL — ABNORMAL LOW (ref 8.9–10.3)
Chloride: 98 mmol/L (ref 98–111)
Creatinine, Ser: 0.94 mg/dL (ref 0.61–1.24)
GFR, Estimated: >60 mL/min (ref >60)
Glucose, Bld: 107 mg/dL — ABNORMAL HIGH (ref 70–99)
Potassium: 3.9 mmol/L (ref 3.5–5.1)
Sodium: 134 mmol/L — ABNORMAL LOW (ref 135–145)
Total Bilirubin: 0.9 mg/dL (ref 0.3–1.2)
Total Protein: 5.8 g/dL — ABNORMAL LOW (ref 6.5–8.1)

## 2021-04-06 LAB — CBC WITH DIFFERENTIAL/PLATELET
Abs Immature Granulocytes: 0.14 10*3/uL — ABNORMAL HIGH (ref 0.00–0.07)
Basophils Absolute: 0 10*3/uL (ref 0.0–0.1)
Basophils Relative: 0 %
Eosinophils Absolute: 0 10*3/uL (ref 0.0–0.5)
Eosinophils Relative: 0 %
HCT: 44.5 % (ref 39.0–52.0)
Hemoglobin: 14.1 g/dL (ref 13.0–17.0)
Immature Granulocytes: 1 %
Lymphocytes Relative: 19 %
Lymphs Abs: 3.1 10*3/uL (ref 0.7–4.0)
MCH: 28.1 pg (ref 26.0–34.0)
MCHC: 31.7 g/dL (ref 30.0–36.0)
MCV: 88.6 fL (ref 80.0–100.0)
Monocytes Absolute: 2 10*3/uL — ABNORMAL HIGH (ref 0.1–1.0)
Monocytes Relative: 12 %
Neutro Abs: 10.9 10*3/uL — ABNORMAL HIGH (ref 1.7–7.7)
Neutrophils Relative %: 68 %
Platelets: 195 10*3/uL (ref 150–400)
RBC: 5.02 MIL/uL (ref 4.22–5.81)
RDW: 14.2 % (ref 11.5–15.5)
WBC: 16.2 10*3/uL — ABNORMAL HIGH (ref 4.0–10.5)
nRBC: 0 % (ref 0.0–0.2)

## 2021-04-06 LAB — MAGNESIUM: Magnesium: 2.2 mg/dL (ref 1.7–2.4)

## 2021-04-06 LAB — RESP PANEL BY RT-PCR (FLU A&B, COVID) ARPGX2
Influenza A by PCR: NEGATIVE
Influenza B by PCR: NEGATIVE
SARS Coronavirus 2 by RT PCR: NEGATIVE

## 2021-04-06 MED ORDER — PANTOPRAZOLE SODIUM 40 MG PO TBEC: 40.0000 mg | DELAYED_RELEASE_TABLET | Freq: Every day | ORAL | 0 refills | Status: DC

## 2021-04-06 MED ORDER — ONDANSETRON HCL 4 MG/2ML IJ SOLN: 4.0000 mg | Freq: Four times a day (QID) | INTRAMUSCULAR | Status: DC | PRN

## 2021-04-06 MED ORDER — LISINOPRIL 5 MG PO TABS: 5.0000 mg | ORAL_TABLET | Freq: Every day | ORAL | Status: DC

## 2021-04-06 MED ORDER — INSULIN DETEMIR 100 UNIT/ML ~~LOC~~ SOLN
15.0000 [IU] | Freq: Every day | SUBCUTANEOUS | Status: DC
  Filled 2021-04-06 (×2): qty 0.15

## 2021-04-06 MED ORDER — ACETAMINOPHEN 650 MG RE SUPP: 650.0000 mg | Freq: Four times a day (QID) | RECTAL | Status: DC | PRN

## 2021-04-06 MED ORDER — ACETAMINOPHEN 325 MG PO TABS: 650.0000 mg | ORAL_TABLET | Freq: Four times a day (QID) | ORAL | Status: DC | PRN

## 2021-04-06 MED ORDER — SODIUM CHLORIDE 0.9 % IV SOLN: INTRAVENOUS | Status: DC

## 2021-04-06 MED ORDER — ONDANSETRON HCL 4 MG PO TABS: 4.0000 mg | ORAL_TABLET | Freq: Four times a day (QID) | ORAL | Status: DC | PRN

## 2021-04-06 MED ORDER — SIMVASTATIN 20 MG PO TABS: 40.0000 mg | ORAL_TABLET | Freq: Every day | ORAL | Status: DC

## 2021-04-06 MED ORDER — INSULIN ASPART 100 UNIT/ML IJ SOLN
0.0000 [IU] | Freq: Three times a day (TID) | INTRAMUSCULAR | Status: DC
  Administered 2021-04-06: 3 [IU] via SUBCUTANEOUS

## 2021-04-06 MED ORDER — METOPROLOL TARTRATE 25 MG PO TABS
12.5000 mg | ORAL_TABLET | Freq: Two times a day (BID) | ORAL | Status: DC
  Administered 2021-04-06: 12.5 mg via ORAL
  Filled 2021-04-06: qty 1

## 2021-04-06 MED ORDER — PANTOPRAZOLE SODIUM 40 MG PO TBEC
40.0000 mg | DELAYED_RELEASE_TABLET | Freq: Every day | ORAL | Status: DC
  Administered 2021-04-06: 40 mg via ORAL
  Filled 2021-04-06: qty 1

## 2021-04-06 MED ORDER — ONDANSETRON HCL 4 MG PO TABS
4.0000 mg | ORAL_TABLET | Freq: Four times a day (QID) | ORAL | 0 refills | Status: DC | PRN
Start: 2021-04-06 — End: 2022-09-06

## 2021-04-06 MED ORDER — OXYCODONE HCL 5 MG PO TABS: 5.0000 mg | ORAL_TABLET | ORAL | Status: DC | PRN

## 2021-04-06 MED ORDER — HEPARIN SODIUM (PORCINE) 5000 UNIT/ML IJ SOLN
5000.0000 [IU] | Freq: Three times a day (TID) | INTRAMUSCULAR | Status: DC
  Administered 2021-04-06: 5000 [IU] via SUBCUTANEOUS
  Filled 2021-04-06: qty 1

## 2021-04-06 MED ORDER — TAMSULOSIN HCL 0.4 MG PO CAPS
0.4000 mg | ORAL_CAPSULE | Freq: Every day | ORAL | Status: DC
  Administered 2021-04-06: 0.4 mg via ORAL
  Filled 2021-04-06: qty 1

## 2021-04-06 MED ORDER — ENSURE ENLIVE PO LIQD
237.0000 mL | Freq: Two times a day (BID) | ORAL | Status: DC
  Administered 2021-04-06 (×2): 237 mL via ORAL

## 2021-04-06 MED ORDER — TRESIBA FLEXTOUCH 100 UNIT/ML ~~LOC~~ SOPN: 15.0000 [IU] | PEN_INJECTOR | Freq: Every morning | SUBCUTANEOUS | Status: AC

## 2021-04-06 NOTE — Evaluation (Signed)
Physical Therapy Evaluation ?Patient Details ?Name: [name] ?MRN: [id number] ?DOB: 02/25/1950 ?Today's Date: 04/06/2021 ? ?History of Present Illness ? [name] is a 71 y.o. male with medical history significant of abdominal aortic aneurysm, coronary artery disease, diabetes mellitus, heart murmur, hyperlipidemia, AKA of the left lower extremity, and more presents the ED with a chief complaint of intractable nausea.  Patient reports he has been throwing up for 3 days.  He thinks he throws up at least 10 times per day.  He does not have any associated abdominal pain.  The emesis is nonbloody.  In the beginning he had diarrhea 2 times per day for 2 days.  He has not been eating because eating makes the nausea worse and he cannot keep anything down.  He has not tried any over-the-counter medications for this problem.  He has not had any fevers.  He has had this happen to him before, the last time was about 3 months ago.  Patient denies hematochezia and melena.  Patient has no other complaints at this time. ?  ?Clinical Impression ? Patient demonstrates good return for bed mobility, transfers, donning/doffing LLE AKA prosthetic leg and ambulating in room and hallways without loss of balance.  Plan:  Patient discharged from physical therapy to care of nursing for ambulation daily as tolerated for length of stay.    ?   ? ?Recommendations for follow up therapy are one component of a multi-disciplinary discharge planning process, led by the attending physician.  Recommendations may be updated based on patient status, additional functional criteria and insurance authorization. ? ?Follow Up Recommendations No PT follow up ? ?  ?Assistance Recommended at Discharge PRN  ?Patient can return home with the following ? Other (comment) (patient near baseline) ? ?  ?Equipment Recommendations None recommended by PT  ?Recommendations for Other Services ?    ?  ?Functional Status Assessment Patient has not had a recent  decline in their functional status  ? ?  ?Precautions / Restrictions Precautions ?Precautions: None ?Restrictions ?Weight Bearing Restrictions: No  ? ?  ? ?Mobility ? Bed Mobility ?Overal bed mobility: Modified Independent ?  ?  ?  ?  ?  ?  ?  ?  ? ?Transfers ?Overall transfer level: Modified independent ?  ?  ?  ?  ?  ?  ?  ?  ?  ?  ? ?Ambulation/Gait ?Ambulation/Gait assistance: Modified independent (Device/Increase time) ?Gait Distance (Feet): 120 Feet ?Assistive device: None ?Gait Pattern/deviations: WFL(Within Functional Limits), Decreased step length - left ?Gait velocity: slightly decreased ?  ?  ?General Gait Details: grossly WFL with slightly labord cadence, increased left hip abduction to clear LLE AKA prosthetic leg, good return for ambulation in room and hallways without loss of balance ? ?Stairs ?  ?  ?  ?  ?  ? ?Wheelchair Mobility ?  ? ?Modified Rankin (Stroke Patients Only) ?  ? ?  ? ?Balance Overall balance assessment: Mild deficits observed, not formally tested ?  ?  ?  ?  ?  ?  ?  ?  ?  ?  ?  ?  ?  ?  ?  ?  ?  ?  ?   ? ? ? ?Pertinent Vitals/Pain Pain Assessment ?Pain Assessment: No/denies pain  ? ? ?Home Living Family/patient expects to be discharged to:: Private residence ?Living Arrangements: Alone ?Available Help at Discharge: Family;Available 24 hours/day ?Type of Home: House ?Home Access: Ramped entrance ?  ?  ?  ?  Home Layout: One level ?Home Equipment: Shower seat;Other (comment) ?Additional Comments: uses AKA prosthetic leg  ?  ?Prior Function Prior Level of Function : Independent/Modified Independent ?  ?  ?  ?  ?  ?  ?Mobility Comments: Tourist information centre manager, drives ?ADLs Comments: Independent ?  ? ? ?Hand Dominance  ? Dominant Hand: Right ? ?  ?Extremity/Trunk Assessment  ? Upper Extremity Assessment ?Upper Extremity Assessment: Overall WFL for tasks assessed ?  ? ?Lower Extremity Assessment ?Lower Extremity Assessment: Overall WFL for tasks assessed ?  ? ?Cervical / Trunk  Assessment ?Cervical / Trunk Assessment: Normal  ?Communication  ? Communication: No difficulties  ?Cognition Arousal/Alertness: Awake/alert ?Behavior During Therapy: [facility] for tasks assessed/performed ?Overall Cognitive Status: Within Functional Limits for tasks assessed ?  ?  ?  ?  ?  ?  ?  ?  ?  ?  ?  ?  ?  ?  ?  ?  ?  ?  ?  ? ?  ?General Comments   ? ?  ?Exercises    ? ?Assessment/Plan  ?  ?PT Assessment Patient does not need any further PT services  ?PT Problem List   ? ?   ?  ?PT Treatment Interventions     ? ?PT Goals (Current goals can be found in the Care Plan section)  ?Acute Rehab PT Goals ?Patient Stated Goal: return home with family to assist ?PT Goal Formulation: With patient ?Time For Goal Achievement: 04/06/21 ?Potential to Achieve Goals: Good ? ?  ?Frequency   ?  ? ? ?Co-evaluation   ?  ?  ?  ?  ? ? ?  ?AM-PAC PT "6 Clicks" Mobility  ?Outcome Measure Help needed turning from your back to your side while in a flat bed without using bedrails?: None ?Help needed moving from lying on your back to sitting on the side of a flat bed without using bedrails?: None ?Help needed moving to and from a bed to a chair (including a wheelchair)?: None ?Help needed standing up from a chair using your arms (e.g., wheelchair or bedside chair)?: None ?Help needed to walk in hospital room?: None ?Help needed climbing 3-5 steps with a railing? : A Little ?6 Click Score: 23 ? ?  ?End of Session   ?Activity Tolerance: Patient tolerated treatment well;Patient limited by fatigue ?Patient left: in chair;with call bell/phone within reach ?Nurse Communication: Mobility status ?PT Visit Diagnosis: Unsteadiness on feet (R26.81);Other abnormalities of gait and mobility (R26.89);Muscle weakness (generalized) (M62.81) ?  ? ?Time: 1950-9326 ?PT Time Calculation (min) (ACUTE ONLY): 29 min ? ? ?Charges:   PT Evaluation ?$PT Eval Moderate Complexity: 1 Mod ?PT Treatments ?$Therapeutic Activity: 23-37 mins ?  ?   ? ? ?3:56 PM,  04/06/21 ?Ocie Bob, MPT ?Physical Therapist with [place] ?[facility] ?[phone] office ?[phone] mobile phone ? ? ?

## 2021-04-06 NOTE — Discharge Summary (Signed)
Physician Discharge Summary  ?[name] ATF:[id number] DOB: Oct 02, 1950 DOA: 04/05/2021 ? ?PCP: Carylon Perches, MD ? ?Admit date: 04/05/2021 ?Discharge date: 04/06/2021 ? ?Time spent: 55 minutes ? ?Recommendations for Outpatient Follow-up:  ?Follow-up with Carylon Perches, MD in 1 week.  On follow-up patient will need a basic metabolic profile done to follow-up on electrolytes and renal function.  Patient need a CBC done to follow-up on leukocytosis.  Patient blood pressure need to be reassessed as patient's ACE inhibitor has been discontinued.  Patient's basal insulin dose has been decreased to 15 units daily and patient's diabetes will need to be reassessed at that time. ? ? ?Discharge Diagnoses:  ?Principal Problem: ?  AKI (acute kidney injury) (HCC) ?Active Problems: ?  Diabetes mellitus without complication (HCC) ?  Hyperlipidemia ?  Intractable vomiting ?  GERD (gastroesophageal reflux disease) ?  Leukocytosis ? ? ?Discharge Condition: Stable and improved ? ?Diet recommendation: Carb modified diet ? ?Filed Weights  ? 04/05/21 1234 04/06/21 0051  ?Weight: 66.7 kg 57 kg  ? ? ?History of present illness:  ?HPI per Dr.Zierle-Ghosh ?[name] is a 71 y.o. male with medical history significant of abdominal aortic aneurysm, coronary artery disease, diabetes mellitus, heart murmur, hyperlipidemia, AKA of the left lower extremity, and more presents the ED with a chief complaint of intractable nausea.  Patient reports he has been throwing up for 3 days.  He thinks he throws up at least 10 times per day.  He does not have any associated abdominal pain.  The emesis is nonbloody.  In the beginning he had diarrhea 2 times per day for 2 days.  He has not been eating because eating makes the nausea worse and he cannot keep anything down.  He has not tried any over-the-counter medications for this problem.  He has not had any fevers.  He has had this happen to him before, the last time was about 3 months ago.  Patient denies  hematochezia and melena.  Patient has no other complaints at this time. ?  ?Patient does not smoke cigarettes, does not use alcohol.  He does smoke marijuana.  He has had 3 COVID shots.  Patient is full code. ?Hospital Course:  ? ?Assessment and Plan: ?* AKI (acute kidney injury) (HCC) ?Creatinine baseline 0.63 ?On admission noted to have a creatinine of 1.30. ?Prerenal, secondary to dehydration secondary to GI losses/intractable nausea vomiting ?Poor p.o. intake also contributing ?-Patient received a 2 L bolus in the ED and aggressively hydrated with IV fluids ?-Patient's ACE inhibitor was discontinued. ?-Renal function improved such that by day of discharge creatinine was down to 0.94 and patient noted to have good oral intake without any further nausea or vomiting. ?-Patient was anxious to be discharged home and asked patient tolerating oral intake able to keep hydrated will discharge home in stable and improved condition with close outpatient follow-up with PCP. ?-Patient's ACE inhibitor will be discontinued on discharge. ? ?Leukocytosis ?Given no acute disease on CT, patient is afebrile, no other infectious symptoms aside from nausea and vomiting, this leukocytosis is most likely secondary to acute phase reactant.  ?-Leukocytosis trended down with hydration.  ?-Patient remained afebrile.  ?-Outpatient follow-up with PCP. ? ?GERD (gastroesophageal reflux disease) ?-Patient maintained on PPI.   ? ?Intractable vomiting ?CT abdomen pelvis shows nothing acute ?Cyclic vomiting syndrome is on the differential as patient is a current marijuana smoker. ?-Patient placed on antiemetics, IV fluids. ?-Patient improved clinically, had no further nausea or vomiting and initially  tolerating a full liquid diet. ?-Diet advanced to a soft diet which she tolerated. ?-Patient be discharged home in stable and improved condition. ?-Outpatient follow-up with PCP. ? ?Hyperlipidemia ?Patient maintained on home regimen statin.    ? ?Diabetes mellitus without complication (HCC) ?Patient takes 25 units of basal insulin at home ?-Patient was maintained on 15 units of basal insulin. ?-Patient's oral hypoglycemic agents were held during the hospitalization. ?-Patient maintained on sliding scale insulin. ?-Patient was discharged home on a reduced dose of basal insulin 15 units daily as well as resumption of home regimen of oral hypoglycemic agents. ?-Outpatient follow-up with PCP. ? ? ? ? ?  ? ?Procedures: ?CT abdomen and pelvis 04/05/2021 ? ?Consultations: ?None ? ?Discharge Exam: ?Vitals:  ? 04/06/21 0847 04/06/21 1331  ?BP: 107/72 99/63  ?Pulse: 91 95  ?Resp:  18  ?Temp:  98 ?F (36.7 ?C)  ?SpO2:  94%  ? ? ?General: NAD ?Cardiovascular: Regular rate rhythm no murmurs rubs or gallops.  No JVD.  No lower extremity edema. ?Respiratory: Clear to auscultation bilaterally.  No wheezes, no crackles, no rhonchi ? ?Discharge Instructions ? ? ?Discharge Instructions   ? ? Diet Carb Modified   Complete by: As directed ?  ? Increase activity slowly   Complete by: As directed ?  ? ?  ? ?Allergies as of 04/06/2021   ?No Known Allergies ?  ? ?  ?Medication List  ?  ? ?STOP taking these medications   ? ?lisinopril 5 MG tablet ?Commonly known as: ZESTRIL ?  ? ?  ? ?TAKE these medications   ? ?aspirin EC 81 MG tablet ?Take 81 mg by mouth daily. ?  ?linagliptin 5 MG Tabs tablet ?Commonly known as: TRADJENTA ?Take 5 mg by mouth daily. ?  ?metFORMIN 500 MG tablet ?Commonly known as: GLUCOPHAGE ?Take 1 tablet by mouth 2 (two) times daily. ?  ?metoprolol tartrate 25 MG tablet ?Commonly known as: LOPRESSOR ?Take 0.5 tablets (12.5 mg total) by mouth 2 (two) times daily. ?  ?nitroGLYCERIN 0.4 MG SL tablet ?Commonly known as: NITROSTAT ?Place 1 tablet (0.4 mg total) under the tongue every 5 (five) minutes as needed for chest pain. ?  ?ondansetron 4 MG tablet ?Commonly known as: ZOFRAN ?Take 1 tablet (4 mg total) by mouth every 6 (six) hours as needed for nausea. ?   ?pantoprazole 40 MG tablet ?Commonly known as: PROTONIX ?Take 1 tablet (40 mg total) by mouth daily. ?  ?simvastatin 40 MG tablet ?Commonly known as: ZOCOR ?Take 40 mg by mouth at bedtime. ?  ?tamsulosin 0.4 MG Caps capsule ?Commonly known as: FLOMAX ?Take 0.4 mg by mouth daily. ?  ?[name] FlexTouch 100 UNIT/ML FlexTouch Pen ?Generic drug: insulin degludec ?Inject 15 Units into the skin every morning. ?What changed: how much to take ?  ? ?  ? ?No Known Allergies ? Follow-up Information   ? ? Carylon Perches, MD. Schedule an appointment as soon as possible for a visit in 1 week(s).   ?Specialty: Internal Medicine ?Contact information: ?[address] ?[place] [address] ?[phone] ? ? ?  ?  ? ? Christell Constant, MD .   ?Specialty: Cardiology ?Contact information: ?[address] ?[address] ?[place] [address] ?[phone] ? ? ?  ?  ? ?  ?  ? ?  ? ? ? ?The results of significant diagnostics from this hospitalization (including imaging, microbiology, ancillary and laboratory) are listed below for reference.   ? ?Significant Diagnostic Studies: ?CT ABDOMEN PELVIS  WO CONTRAST ? ?Result Date: 04/05/2021 ?CLINICAL DATA:  Nausea and vomiting. EXAM: CT ABDOMEN AND PELVIS WITHOUT CONTRAST TECHNIQUE: Multidetector CT imaging of the abdomen and pelvis was performed following the standard protocol without IV contrast. RADIATION DOSE REDUCTION: This exam was performed according to the departmental dose-optimization program which includes automated exposure control, adjustment of the mA and/or kV according to patient size and/or use of iterative reconstruction technique. COMPARISON:  10/17/2020 FINDINGS: Lower chest: Subpleural reticulation in the lung bases with progression from prior exam. There is bronchiectasis predominantly in the right middle and lower lobes. Hepatobiliary: Scattered hepatic granuloma. No suspicious liver lesion. Focal fatty infiltration adjacent to the falciform ligament. Small gallstones  without gallbladder inflammation. No biliary dilatation. Pancreas: No ductal dilatation or inflammation. Spleen: Normal in size without focal abnormality. 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 A s

## 2021-04-06 NOTE — Progress Notes (Signed)
OT Screenn Note ? ?Patient Details ?Name: [name] ?MRN: [id number] ?DOB: 1950-11-15 ? ? ?Cancelled Treatment:    Reason Eval/Treat Not Completed: OT screened, no needs identified, will sign off ? ?Limmie Patricia, OTR/L,CBIS  ?[phone] ? ?04/06/2021, 4:14 PM ?

## 2021-04-06 NOTE — Assessment & Plan Note (Addendum)
Patient takes 25 units of basal insulin at home ?-Patient was maintained on 15 units of basal insulin. ?-Patient's oral hypoglycemic agents were held during the hospitalization. ?-Patient maintained on sliding scale insulin. ?-Patient was discharged home on a reduced dose of basal insulin 15 units daily as well as resumption of home regimen of oral hypoglycemic agents. ?-Outpatient follow-up with PCP. ?

## 2021-04-06 NOTE — Assessment & Plan Note (Addendum)
Creatinine baseline 0.63 ?On admission noted to have a creatinine of 1.30. ?Prerenal, secondary to dehydration secondary to GI losses/intractable nausea vomiting ?Poor p.o. intake also contributing ?-Patient received a 2 L bolus in the ED and aggressively hydrated with IV fluids ?-Patient's ACE inhibitor was discontinued. ?-Renal function improved such that by day of discharge creatinine was down to 0.94 and patient noted to have good oral intake without any further nausea or vomiting. ?-Patient was anxious to be discharged home and asked patient tolerating oral intake able to keep hydrated will discharge home in stable and improved condition with close outpatient follow-up with PCP. ?-Patient's ACE inhibitor will be discontinued on discharge. ?

## 2021-04-06 NOTE — Progress Notes (Signed)
Patient has had no nausea vomiting or diarrhea tonight. No complaints during shift. Patient has slept since being admitted to floor.  ?

## 2021-04-06 NOTE — TOC Progression Note (Signed)
Transition of Care (TOC) - Progression Note  ? ? ?Patient Details  ?Name: [name] ?MRN: [id number] ?Date of Birth: [birth year] ? ?Transition of Care (TOC) CM/SW Contact  ?Karn Cassis, LCSW ?Phone Number: ?04/06/2021, 10:37 AM ? ?Clinical Narrative:   ?Transition of Care (TOC) Screening Note ? ? ?Patient Details  ?Name: [name] ?Date of Birth: [birth year] ? ? ?Transition of Care (TOC) CM/SW Contact:    ?Karn Cassis, LCSW ?Phone Number: ?04/06/2021, 10:38 AM ? ? ? ?Transition of Care Department [facility]) has reviewed patient and no TOC needs have been identified at this time. We will continue to monitor patient advancement through interdisciplinary progression rounds. If new patient transition needs arise, please place a TOC consult. ?   ? ? ? ?  ?  ? ?Expected Discharge Plan and Services ?  ?  ?  ?  ?  ?                ?  ?  ?  ?  ?  ?  ?  ?  ?  ?  ? ? ?Social Determinants of Health (SDOH) Interventions ?  ? ?Readmission Risk Interventions ?No flowsheet data found. ? ?

## 2021-04-06 NOTE — Assessment & Plan Note (Addendum)
Given no acute disease on CT, patient is afebrile, no other infectious symptoms aside from nausea and vomiting, this leukocytosis is most likely secondary to acute phase reactant.  ?-Leukocytosis trended down with hydration.  ?-Patient remained afebrile.  ?-Outpatient follow-up with PCP. ?

## 2021-04-06 NOTE — Assessment & Plan Note (Addendum)
-  Patient maintained on PPI.       

## 2021-04-06 NOTE — Assessment & Plan Note (Addendum)
Patient maintained on home regimen statin. ?
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
## 2021-04-06 NOTE — Assessment & Plan Note (Addendum)
CT abdomen pelvis shows nothing acute ?Cyclic vomiting syndrome is on the differential as patient is a current marijuana smoker. ?-Patient placed on antiemetics, IV fluids. ?-Patient improved clinically, had no further nausea or vomiting and initially tolerating a full liquid diet. ?-Diet advanced to a soft diet which she tolerated. ?-Patient be discharged home in stable and improved condition. ?-Outpatient follow-up with PCP. ?

## 2021-04-21 ENCOUNTER — Telehealth: Payer: Self-pay | Admitting: *Deleted

## 2021-04-21 ENCOUNTER — Encounter: Payer: Self-pay | Admitting: *Deleted

## 2021-04-21 MED ORDER — PEG 3350-KCL-NA BICARB-NACL 420 G PO SOLR: ORAL | 0 refills | Status: DC

## 2021-04-21 NOTE — Telephone Encounter (Signed)
Called pt. He has been scheduled for TCS with propofol asa 3 Dr. Jena Gauss on 5/4 at 10:30am. Aware will mail prep instructions/pre-op appt. Will send rx to pharmacy. ? ? ?PA approved via cohere. Auth# [id number], DOS: 05/20/2021 - 08/18/2021 ?

## 2021-05-13 ENCOUNTER — Encounter: Payer: Self-pay | Admitting: Orthopaedic Surgery

## 2021-05-13 ENCOUNTER — Ambulatory Visit (INDEPENDENT_AMBULATORY_CARE_PROVIDER_SITE_OTHER): Payer: Medicare HMO | Admitting: Orthopaedic Surgery

## 2021-05-13 DIAGNOSIS — M25511 Pain in right shoulder: Secondary | ICD-10-CM | POA: Diagnosis not present

## 2021-05-13 DIAGNOSIS — G8929 Other chronic pain: Secondary | ICD-10-CM | POA: Diagnosis not present

## 2021-05-13 MED ORDER — HYDROCODONE-ACETAMINOPHEN 5-325 MG PO TABS: ORAL_TABLET | ORAL | 0 refills | Status: DC

## 2021-05-13 NOTE — Progress Notes (Signed)
PROCEDURE NOTE: ? ?The patient request injection, verbal consent was obtained. ? ?The right shoulder was prepped appropriately after time out was performed.  ? ?Sterile technique was observed and injection of 1 cc of DepoMedrol 40mg  with several cc's of plain xylocaine. Anesthesia was provided by ethyl chloride and a 20-gauge needle was used to inject the shoulder area. A posterior approach was used.  The injection was tolerated well. ? ?A band aid dressing was applied. ? ?The patient was advised to apply ice later today and tomorrow to the injection sight as needed. ?  ?Encounter Diagnosis  ?Name Primary?  ? Chronic right shoulder pain Yes  ? ?I have reviewed the Controlled Substance Reporting System web site prior to prescribing narcotic medicine for this patient. ? ?Return prn ? ?Call if any problem. ? ?Precautions discussed. ? ?Electronically Signed ?[place], MD ?4/27/20239:16 AM ? ?

## 2021-05-17 NOTE — Patient Instructions (Signed)
? ? ? ? ? ? [name] ? 05/17/2021  ?  ? @PREFPERIOPPHARMACY @ ? ? Your procedure is scheduled on  05/20/2021. ? ? Report to [facility] at  0900  A.M. ? ? Call this number if you have problems the morning of surgery: ? [phone] ? ? Remember: ? Follow the diet and prep instructions given to you by the office. ?  ? Take these medicines the morning of surgery with A SIP OF WATER  ? ?    Hydrocodone(if needed), metoprolol, zofran(if needed), protonix, flomax. ?  ? Do not wear jewelry, make-up or nail polish. ? Do not wear lotions, powders, or perfumes, or deodorant. ? Do not shave 48 hours prior to surgery.  Men may shave face and neck. ? Do not bring valuables to the hospital. ? [facility] is not responsible for any belongings or valuables. ? ?Contacts, dentures or bridgework may not be worn into surgery.  Leave your suitcase in the car.  After surgery it may be brought to your room. ? ?For patients admitted to the hospital, discharge time will be determined by your treatment team. ? ?Patients discharged the day of surgery will not be allowed to drive home and must have someone with them for 24 hours.  ? ? ?Special instructions:   DO NOT smoke tobacco or vape for 24 hours before your procedure. ? ?Please read over the following fact sheets that you were given. ?Anesthesia Post-op Instructions and Care and Recovery After Surgery ?  ? ? ? Colonoscopy, Adult, Care After ?The following information offers guidance on how to care for yourself after your procedure. Your health care provider may also give you more specific instructions. If you have problems or questions, contact your health care provider. ?What can I expect after the procedure? ?After the procedure, it is common to have: ?A small amount of blood in your stool for 24 hours after the procedure. ?Some gas. ?Mild cramping or bloating of your abdomen. ?Follow these instructions at home: ?Eating and drinking ? ?Drink enough fluid to keep your urine pale  yellow. ?Follow instructions from your health care provider about eating or drinking restrictions. ?Resume your normal diet as told by your health care provider. Avoid heavy or fried foods that are hard to digest. ?Activity ?Rest as told by your health care provider. ?Avoid sitting for a long time without moving. Get up to take short walks every 1-2 hours. This is important to improve blood flow and breathing. Ask for help if you feel weak or unsteady. ?Return to your normal activities as told by your health care provider. Ask your health care provider what activities are safe for you. ?Managing cramping and bloating ? ?Try walking around when you have cramps or feel bloated. ?If directed, apply heat to your abdomen as told by your health care provider. Use the heat source that your health care provider recommends, such as a moist heat pack or a heating pad. ?Place a towel between your skin and the heat source. ?Leave the heat on for 20-30 minutes. ?Remove the heat if your skin turns bright red. This is especially important if you are unable to feel pain, heat, or cold. You have a greater risk of getting burned. ?General instructions ?If you were given a sedative during the procedure, it can affect you for several hours. Do not drive or operate machinery until your health care provider says that it is safe. ?For the first 24 hours after the procedure: ?Do  not sign important documents. ?Do not drink alcohol. ?Do your regular daily activities at a slower pace than normal. ?Eat soft foods that are easy to digest. ?Take over-the-counter and prescription medicines only as told by your health care provider. ?Keep all follow-up visits. This is important. ?Contact a health care provider if: ?You have blood in your stool 2-3 days after the procedure. ?Get help right away if: ?You have more than a small spotting of blood in your stool. ?You have large blood clots in your stool. ?You have swelling of your abdomen. ?You have  nausea or vomiting. ?You have a fever. ?You have increasing pain in your abdomen that is not relieved with medicine. ?These symptoms may be an emergency. Get help right away. Call 911. ?Do not wait to see if the symptoms will go away. ?Do not drive yourself to the hospital. ?Summary ?After the procedure, it is common to have a small amount of blood in your stool. You may also have mild cramping and bloating of your abdomen. ?If you were given a sedative during the procedure, it can affect you for several hours. Do not drive or operate machinery until your health care provider says that it is safe. ?Get help right away if you have a lot of blood in your stool, nausea or vomiting, a fever, or increased pain in your abdomen. ?This information is not intended to replace advice given to you by your health care provider. Make sure you discuss any questions you have with your health care provider. ?Document Revised: 08/26/2020 Document Reviewed: 08/26/2020 ?Elsevier Patient Education ? [place]. ?Monitored Anesthesia Care, Care After ?This sheet gives you information about how to care for yourself after your procedure. Your health care provider may also give you more specific instructions. If you have problems or questions, contact your health care provider. ?What can I expect after the procedure? ?After the procedure, it is common to have: ?Tiredness. ?Forgetfulness about what happened after the procedure. ?Impaired judgment for important decisions. ?Nausea or vomiting. ?Some difficulty with balance. ?Follow these instructions at home: ?For the time period you were told by your health care provider: ? ?  ? ?Rest as needed. ?Do not participate in activities where you could fall or become injured. ?Do not drive or use machinery. ?Do not drink alcohol. ?Do not take sleeping pills or medicines that cause drowsiness. ?Do not make important decisions or sign legal documents. ?Do not take care of children on your  own. ?Eating and drinking ?Follow the diet that is recommended by your health care provider. ?Drink enough fluid to keep your urine pale yellow. ?If you vomit: ?Drink water, juice, or soup when you can drink without vomiting. ?Make sure you have little or no nausea before eating solid foods. ?General instructions ?Have a responsible adult stay with you for the time you are told. It is important to have someone help care for you until you are awake and alert. ?Take over-the-counter and prescription medicines only as told by your health care provider. ?If you have sleep apnea, surgery and certain medicines can increase your risk for breathing problems. Follow instructions from your health care provider about wearing your sleep device: ?Anytime you are sleeping, including during daytime naps. ?While taking prescription pain medicines, sleeping medicines, or medicines that make you drowsy. ?Avoid smoking. ?Keep all follow-up visits as told by your health care provider. This is important. ?Contact a health care provider if: ?You keep feeling nauseous or you keep vomiting. ?You feel  light-headed. ?You are still sleepy or having trouble with balance after 24 hours. ?You develop a rash. ?You have a fever. ?You have redness or swelling around the IV site. ?Get help right away if: ?You have trouble breathing. ?You have new-onset confusion at home. ?Summary ?For several hours after your procedure, you may feel tired. You may also be forgetful and have poor judgment. ?Have a responsible adult stay with you for the time you are told. It is important to have someone help care for you until you are awake and alert. ?Rest as told. Do not drive or operate machinery. Do not drink alcohol or take sleeping pills. ?Get help right away if you have trouble breathing, or if you suddenly become confused. ?This information is not intended to replace advice given to you by your health care provider. Make sure you discuss any questions you  have with your health care provider. ?Document Revised: 12/08/2020 Document Reviewed: 12/06/2018 ?Elsevier Patient Education ? [place]. ? ?

## 2021-05-18 ENCOUNTER — Encounter (HOSPITAL_COMMUNITY)
Admission: RE | Admit: 2021-05-18 | Discharge: 2021-05-18 | Disposition: A | Discharge status: Home / Self Care | Payer: Medicare HMO | Source: Ambulatory Visit | Attending: Internal Medicine | Admitting: Internal Medicine

## 2021-05-18 ENCOUNTER — Encounter (HOSPITAL_COMMUNITY): Payer: Self-pay

## 2021-05-19 ENCOUNTER — Telehealth: Payer: Self-pay | Admitting: Internal Medicine

## 2021-05-19 NOTE — Telephone Encounter (Signed)
Tried several more times to reach pt, fast busy signal. Tried to call sister (emergency contact), [facility] for return call. ? ? ?

## 2021-05-19 NOTE — Telephone Encounter (Signed)
Tried to call pt several times, fast busy signal. ?

## 2021-05-19 NOTE — Telephone Encounter (Signed)
Patient needs to reschedule his procedure, did not go to his pre op  ?

## 2021-05-19 NOTE — Telephone Encounter (Signed)
Spoke to pt, informed him we will call to reschedule TCS when Dr. Roseanne Kaufman next schedule is available. ?

## 2021-05-19 NOTE — Telephone Encounter (Signed)
Informed endo scheduler to cancel TCS for tomorrow. ?

## 2021-05-20 ENCOUNTER — Ambulatory Visit (HOSPITAL_COMMUNITY): Admission: RE | Admit: 2021-05-20 | Payer: Medicare HMO | Source: Home / Self Care | Admitting: Internal Medicine

## 2021-05-20 ENCOUNTER — Encounter (HOSPITAL_COMMUNITY): Admission: RE | Payer: Self-pay | Source: Home / Self Care

## 2021-05-20 SURGERY — COLONOSCOPY WITH PROPOFOL
Anesthesia: Monitor Anesthesia Care

## 2021-05-28 ENCOUNTER — Encounter: Payer: Self-pay | Admitting: *Deleted

## 2021-05-28 NOTE — Telephone Encounter (Signed)
Called spoke with pt. He has been rescheduled to 6/16 at 8am. Aware will mail prep instructions/pr-eop. Reports he already has his prep at home. ? ?PA approved via cohere. Auth# [id number], DOS: 05/20/2021 - 08/18/2021 ?

## 2021-06-28 NOTE — Patient Instructions (Signed)
[name]  06/28/2021     @PREFPERIOPPHARMACY @   Your procedure is scheduled on  07/02/2021.   Report to 07/04/2021 at  [phone]  A.M.   Call this number if you have problems the morning of surgery:  [phone]   Remember:  Follow the diet and prep instructions given to you by the office.    DO NOT take any medications for diabetes the morning of your procedure.    Take these medicines the morning of surgery with A SIP OF WATER          hydrocodone(If needed), metoprolol, zofran (if needed), protonix, flomax.     Do not wear jewelry, make-up or nail polish.  Do not wear lotions, powders, or perfumes, or deodorant.  Do not shave 48 hours prior to surgery.  Men may shave face and neck.  Do not bring valuables to the hospital.  [facility] is not responsible for any belongings or valuables.  Contacts, dentures or bridgework may not be worn into surgery.  Leave your suitcase in the car.  After surgery it may be brought to your room.  For patients admitted to the hospital, discharge time will be determined by your treatment team.  Patients discharged the day of surgery will not be allowed to drive home and must have someone with them for 24 hours.    Special instructions:   DO NOT smoke tobacco or vape for 24 hours before your procedure.  Please read over the following fact sheets that you were given. Anesthesia Post-op Instructions and Care and Recovery After Surgery      Colonoscopy, Adult, Care After The following information offers guidance on how to care for yourself after your procedure. Your health care provider may also give you more specific instructions. If you have problems or questions, contact your health care provider. What can I expect after the procedure? After the procedure, it is common to have: A small amount of blood in your stool for 24 hours after the procedure. Some gas. Mild cramping or bloating of your abdomen. Follow these  instructions at home: Eating and drinking  Drink enough fluid to keep your urine pale yellow. Follow instructions from your health care provider about eating or drinking restrictions. Resume your normal diet as told by your health care provider. Avoid heavy or fried foods that are hard to digest. Activity Rest as told by your health care provider. Avoid sitting for a long time without moving. Get up to take short walks every 1-2 hours. This is important to improve blood flow and breathing. Ask for help if you feel weak or unsteady. Return to your normal activities as told by your health care provider. Ask your health care provider what activities are safe for you. Managing cramping and bloating  Try walking around when you have cramps or feel bloated. If directed, apply heat to your abdomen as told by your health care provider. Use the heat source that your health care provider recommends, such as a moist heat pack or a heating pad. Place a towel between your skin and the heat source. Leave the heat on for 20-30 minutes. Remove the heat if your skin turns bright red. This is especially important if you are unable to feel pain, heat, or cold. You have a greater risk of getting burned. General instructions If you were given a sedative during the procedure, it can affect you for several hours. Do not drive or operate  machinery until your health care provider says that it is safe. For the first 24 hours after the procedure: Do not sign important documents. Do not drink alcohol. Do your regular daily activities at a slower pace than normal. Eat soft foods that are easy to digest. Take over-the-counter and prescription medicines only as told by your health care provider. Keep all follow-up visits. This is important. Contact a health care provider if: You have blood in your stool 2-3 days after the procedure. Get help right away if: You have more than a small spotting of blood in your  stool. You have large blood clots in your stool. You have swelling of your abdomen. You have nausea or vomiting. You have a fever. You have increasing pain in your abdomen that is not relieved with medicine. These symptoms may be an emergency. Get help right away. Call 911. Do not wait to see if the symptoms will go away. Do not drive yourself to the hospital. Summary After the procedure, it is common to have a small amount of blood in your stool. You may also have mild cramping and bloating of your abdomen. If you were given a sedative during the procedure, it can affect you for several hours. Do not drive or operate machinery until your health care provider says that it is safe. Get help right away if you have a lot of blood in your stool, nausea or vomiting, a fever, or increased pain in your abdomen. This information is not intended to replace advice given to you by your health care provider. Make sure you discuss any questions you have with your health care provider. Document Revised: 08/26/2020 Document Reviewed: 08/26/2020 Elsevier Patient Education  [place] After This sheet gives you information about how to care for yourself after your procedure. Your health care provider may also give you more specific instructions. If you have problems or questions, contact your health care provider. What can I expect after the procedure? After the procedure, it is common to have: Tiredness. Forgetfulness about what happened after the procedure. Impaired judgment for important decisions. Nausea or vomiting. Some difficulty with balance. Follow these instructions at home: For the time period you were told by your health care provider:     Rest as needed. Do not participate in activities where you could fall or become injured. Do not drive or use machinery. Do not drink alcohol. Do not take sleeping pills or medicines that cause drowsiness. Do  not make important decisions or sign legal documents. Do not take care of children on your own. Eating and drinking Follow the diet that is recommended by your health care provider. Drink enough fluid to keep your urine pale yellow. If you vomit: Drink water, juice, or soup when you can drink without vomiting. Make sure you have little or no nausea before eating solid foods. General instructions Have a responsible adult stay with you for the time you are told. It is important to have someone help care for you until you are awake and alert. Take over-the-counter and prescription medicines only as told by your health care provider. If you have sleep apnea, surgery and certain medicines can increase your risk for breathing problems. Follow instructions from your health care provider about wearing your sleep device: Anytime you are sleeping, including during daytime naps. While taking prescription pain medicines, sleeping medicines, or medicines that make you drowsy. Avoid smoking. Keep all follow-up visits as told by your health care  provider. This is important. Contact a health care provider if: You keep feeling nauseous or you keep vomiting. You feel light-headed. You are still sleepy or having trouble with balance after 24 hours. You develop a rash. You have a fever. You have redness or swelling around the IV site. Get help right away if: You have trouble breathing. You have new-onset confusion at home. Summary For several hours after your procedure, you may feel tired. You may also be forgetful and have poor judgment. Have a responsible adult stay with you for the time you are told. It is important to have someone help care for you until you are awake and alert. Rest as told. Do not drive or operate machinery. Do not drink alcohol or take sleeping pills. Get help right away if you have trouble breathing, or if you suddenly become confused. This information is not intended to replace  advice given to you by your health care provider. Make sure you discuss any questions you have with your health care provider. Document Revised: 12/08/2020 Document Reviewed: 12/06/2018 Elsevier Patient Education  [place].

## 2021-06-30 ENCOUNTER — Encounter (HOSPITAL_COMMUNITY)
Admission: RE | Admit: 2021-06-30 | Discharge: 2021-06-30 | Disposition: A | Discharge status: Home / Self Care | Payer: Medicare HMO | Source: Ambulatory Visit | Attending: Internal Medicine | Admitting: Internal Medicine

## 2021-06-30 VITALS — BP 126/67 | HR 58 | Temp 97.8°F | Resp 18 | Ht 70.0 in | Wt 126.8 lb

## 2021-06-30 DIAGNOSIS — E119 Type 2 diabetes mellitus without complications: Secondary | ICD-10-CM | POA: Diagnosis not present

## 2021-06-30 DIAGNOSIS — Z01812 Encounter for preprocedural laboratory examination: Secondary | ICD-10-CM | POA: Diagnosis not present

## 2021-06-30 LAB — BASIC METABOLIC PANEL
Anion gap: 7 (ref 5–15)
BUN: 13 mg/dL (ref 8–23)
CO2: 25 mmol/L (ref 22–32)
Calcium: 9.1 mg/dL (ref 8.9–10.3)
Chloride: 107 mmol/L (ref 98–111)
Creatinine, Ser: 0.74 mg/dL (ref 0.61–1.24)
GFR, Estimated: >60 mL/min (ref >60)
Glucose, Bld: 160 mg/dL — ABNORMAL HIGH (ref 70–99)
Potassium: 4.3 mmol/L (ref 3.5–5.1)
Sodium: 139 mmol/L (ref 135–145)

## 2021-07-02 ENCOUNTER — Ambulatory Visit (HOSPITAL_COMMUNITY)
Admission: RE | Admit: 2021-07-02 | Discharge: 2021-07-02 | Disposition: A | Discharge status: Home / Self Care | Payer: Medicare HMO | Attending: Internal Medicine | Admitting: Internal Medicine

## 2021-07-02 ENCOUNTER — Encounter (HOSPITAL_COMMUNITY): Payer: Self-pay | Admitting: Internal Medicine

## 2021-07-02 ENCOUNTER — Ambulatory Visit (HOSPITAL_COMMUNITY): Payer: Medicare HMO | Admitting: Anesthesiology

## 2021-07-02 ENCOUNTER — Encounter (HOSPITAL_COMMUNITY)
Admission: RE | Disposition: A | Discharge status: Home / Self Care | Payer: Self-pay | Source: Home / Self Care | Attending: Internal Medicine

## 2021-07-02 ENCOUNTER — Ambulatory Visit (HOSPITAL_BASED_OUTPATIENT_CLINIC_OR_DEPARTMENT_OTHER): Payer: Medicare HMO | Admitting: Anesthesiology

## 2021-07-02 DIAGNOSIS — E119 Type 2 diabetes mellitus without complications: Secondary | ICD-10-CM | POA: Diagnosis not present

## 2021-07-02 DIAGNOSIS — Z7984 Long term (current) use of oral hypoglycemic drugs: Secondary | ICD-10-CM | POA: Diagnosis not present

## 2021-07-02 DIAGNOSIS — I2510 Atherosclerotic heart disease of native coronary artery without angina pectoris: Secondary | ICD-10-CM | POA: Insufficient documentation

## 2021-07-02 DIAGNOSIS — Z86010 Personal history of colonic polyps: Secondary | ICD-10-CM | POA: Diagnosis not present

## 2021-07-02 DIAGNOSIS — Z87891 Personal history of nicotine dependence: Secondary | ICD-10-CM | POA: Diagnosis not present

## 2021-07-02 DIAGNOSIS — I252 Old myocardial infarction: Secondary | ICD-10-CM

## 2021-07-02 DIAGNOSIS — K219 Gastro-esophageal reflux disease without esophagitis: Secondary | ICD-10-CM | POA: Diagnosis not present

## 2021-07-02 DIAGNOSIS — Z8719 Personal history of other diseases of the digestive system: Secondary | ICD-10-CM | POA: Diagnosis present

## 2021-07-02 DIAGNOSIS — Z1211 Encounter for screening for malignant neoplasm of colon: Secondary | ICD-10-CM | POA: Diagnosis not present

## 2021-07-02 DIAGNOSIS — K5730 Diverticulosis of large intestine without perforation or abscess without bleeding: Secondary | ICD-10-CM | POA: Diagnosis not present

## 2021-07-02 DIAGNOSIS — Z794 Long term (current) use of insulin: Secondary | ICD-10-CM | POA: Insufficient documentation

## 2021-07-02 LAB — GLUCOSE, CAPILLARY
Glucose-Capillary: 109 mg/dL — ABNORMAL HIGH (ref 70–99)
Glucose-Capillary: 94 mg/dL (ref 70–99)

## 2021-07-02 SURGERY — COLONOSCOPY WITH PROPOFOL
Anesthesia: General

## 2021-07-02 MED ORDER — LACTATED RINGERS IV SOLN: INTRAVENOUS | Status: DC | PRN

## 2021-07-02 MED ORDER — PROPOFOL 500 MG/50ML IV EMUL
INTRAVENOUS | Status: DC | PRN
  Administered 2021-07-02: 150 ug/kg/min via INTRAVENOUS

## 2021-07-02 MED ORDER — PHENYLEPHRINE HCL (PRESSORS) 10 MG/ML IV SOLN
INTRAVENOUS | Status: DC | PRN
  Administered 2021-07-02 (×2): 100 ug via INTRAVENOUS

## 2021-07-02 MED ORDER — STERILE WATER FOR IRRIGATION IR SOLN
Status: DC | PRN
  Administered 2021-07-02: 50 mL

## 2021-07-02 MED ORDER — LIDOCAINE HCL (CARDIAC) PF 100 MG/5ML IV SOSY
PREFILLED_SYRINGE | INTRAVENOUS | Status: DC | PRN
  Administered 2021-07-02: 50 mg via INTRAVENOUS

## 2021-07-02 MED ORDER — PROPOFOL 10 MG/ML IV BOLUS
INTRAVENOUS | Status: DC | PRN
  Administered 2021-07-02: 100 mg via INTRAVENOUS

## 2021-07-02 NOTE — Transfer of Care (Signed)
Immediate Anesthesia Transfer of Care Note  Patient: [name]  Procedure(s) Performed: COLONOSCOPY WITH PROPOFOL  Patient Location: Short Stay  Anesthesia Type:General  Level of Consciousness: drowsy  Airway & Oxygen Therapy: Patient Spontanous Breathing  Post-op Assessment: Report given to RN and Post -op Vital signs reviewed and stable  Post vital signs: Reviewed and stable  Last Vitals:  Vitals Value Taken Time  BP    Temp    Pulse    Resp    SpO2      Last Pain:  Vitals:   07/02/21 0817  TempSrc:   PainSc: 0-No pain      Patients Stated Pain Goal: 5 (07/02/21 0710)  Complications: No notable events documented.

## 2021-07-02 NOTE — Op Note (Signed)
[facility] Patient Name: [name] Procedure Date: 07/02/2021 8:02 AM MRN: [id number] Date of Birth: 21-Nov-1950 Attending MD: Gennette Pac , MD CSN: [id number] Age: 71 Admit Type: Outpatient Procedure:                Colonoscopy Indications:              Screening for colorectal malignant neoplasm Providers:                Gennette Pac, MD, Pandora Leiter,                            Technician, Buel Ream. Thomasena Edis RN, RN Referring MD:              Medicines:                Propofol per Anesthesia Complications:            No immediate complications. Estimated Blood Loss:     Estimated blood loss: none. Procedure:                Pre-Anesthesia Assessment:                           - Prior to the procedure, a History and Physical                            was performed, and patient medications and                            allergies were reviewed. The patient's tolerance of                            previous anesthesia was also reviewed. The risks                            and benefits of the procedure and the sedation                            options and risks were discussed with the patient.                            All questions were answered, and informed consent                            was obtained. Prior Anticoagulants: The patient has                            taken no previous anticoagulant or antiplatelet                            agents. ASA Grade Assessment: III - A patient with                            severe systemic disease. After reviewing the risks  and benefits, the patient was deemed in                            satisfactory condition to undergo the procedure.                           After obtaining informed consent, the colonoscope                            was passed under direct vision. Throughout the                            procedure, the patient's blood pressure, pulse, and                             oxygen saturations were monitored continuously. The                            [id number]) scope was introduced through the                            anus and advanced to the the cecum, identified by                            appendiceal orifice and ileocecal valve. The                            colonoscopy was performed without difficulty. The                            patient tolerated the procedure well. The entire                            colon was well visualized. Scope In: 8:22:16 AM Scope Out: 8:36:21 AM Scope Withdrawal Time: 0 hours 6 minutes 3 seconds  Total Procedure Duration: 0 hours 14 minutes 5 seconds  Findings:      The perianal and digital rectal examinations were normal.      Scattered small-mouthed diverticula were found in the sigmoid colon and       descending colon.      The exam was otherwise without abnormality on direct and retroflexion       views. Impression:               - Diverticulosis in the sigmoid colon and in the                            descending colon.                           - The examination was otherwise normal on direct                            and retroflexion views.                           -  No specimens collected. Moderate Sedation:      Moderate (conscious) sedation was personally administered by an       anesthesia professional. The following parameters were monitored: oxygen       saturation, heart rate, blood pressure, respiratory rate, EKG, adequacy       of pulmonary ventilation, and response to care. Recommendation:           - Patient has a contact number available for                            emergencies. The signs and symptoms of potential                            delayed complications were discussed with the                            patient. Return to normal activities tomorrow.                            Written discharge instructions were provided to the                            patient.                            - Resume previous diet.                           - Continue present medications.                           - No repeat colonoscopy due to age.                           - Return to GI clinic (date not yet determined). Procedure Code(s):        --- Professional ---                           [phone], Colonoscopy, flexible; diagnostic, including                            collection of specimen(s) by brushing or washing,                            when performed (separate procedure) Diagnosis Code(s):        --- Professional ---                           Z12.11, Encounter for screening for malignant                            neoplasm of colon                           K57.30, Diverticulosis of large intestine without  perforation or abscess without bleeding CPT copyright 2019 American Medical Association. All rights reserved. The codes documented in this report are preliminary and upon coder review may  be revised to meet current compliance requirements. Cristopher Estimable. Peirce Deveney, MD Norvel Richards, MD 07/02/2021 8:43:25 AM This report has been signed electronically. Number of Addenda: 0

## 2021-07-02 NOTE — Anesthesia Postprocedure Evaluation (Signed)
Anesthesia Post Note  Patient: [name]  Procedure(s) Performed: COLONOSCOPY WITH PROPOFOL  Patient location during evaluation: Phase II Anesthesia Type: General Level of consciousness: awake Pain management: pain level controlled Vital Signs Assessment: post-procedure vital signs reviewed and stable Respiratory status: spontaneous breathing and respiratory function stable Cardiovascular status: blood pressure returned to baseline and stable Postop Assessment: no headache and no apparent nausea or vomiting Anesthetic complications: no Comments: Late entry   No notable events documented.   Last Vitals:  Vitals:   07/02/21 0710 07/02/21 0839  BP: 109/85 98/61  Pulse: 74 80  Resp: (!) 22 (!) 24  Temp: 36.6 C (!) 36.3 C  SpO2: 93% 95%    Last Pain:  Vitals:   07/02/21 0839  TempSrc: Oral  PainSc: 0-No pain                 [name]

## 2021-07-02 NOTE — Anesthesia Preprocedure Evaluation (Signed)
Anesthesia Evaluation  Patient identified by MRN, date of birth, ID band Patient awake    Reviewed: Allergy & Precautions, H&P , NPO status , Patient's Chart, lab work & pertinent test results, reviewed documented beta blocker date and time   Airway Mallampati: II  TM Distance: >3 FB Neck ROM: full    Dental no notable dental hx.    Pulmonary neg pulmonary ROS, former smoker,    Pulmonary exam normal breath sounds clear to auscultation       Cardiovascular Exercise Tolerance: Good + CAD and + Past MI   Rhythm:regular Rate:Normal     Neuro/Psych negative neurological ROS  negative psych ROS   GI/Hepatic Neg liver ROS, GERD  Medicated,  Endo/Other  negative endocrine ROSdiabetes  Renal/GU negative Renal ROS  negative genitourinary   Musculoskeletal   Abdominal   Peds  Hematology negative hematology ROS (+)   Anesthesia Other Findings   Reproductive/Obstetrics negative OB ROS                             Anesthesia Physical Anesthesia Plan  ASA: 3  Anesthesia Plan: General   Post-op Pain Management:    Induction:   PONV Risk Score and Plan: Propofol infusion  Airway Management Planned:   Additional Equipment:   Intra-op Plan:   Post-operative Plan:   Informed Consent: I have reviewed the patients History and Physical, chart, labs and discussed the procedure including the risks, benefits and alternatives for the proposed anesthesia with the patient or authorized representative who has indicated his/her understanding and acceptance.     Dental Advisory Given  Plan Discussed with: CRNA  Anesthesia Plan Comments:         Anesthesia Quick Evaluation
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
## 2021-07-02 NOTE — Anesthesia Procedure Notes (Signed)
Date/Time: 07/02/2021 8:26 AM  Performed by: Julian Reil, CRNAPre-anesthesia Checklist: Patient identified, Emergency Drugs available, Suction available and Patient being monitored Patient Re-evaluated:Patient Re-evaluated prior to induction Oxygen Delivery Method: Nasal cannula Induction Type: IV induction Placement Confirmation: positive ETCO2

## 2021-07-02 NOTE — Discharge Instructions (Addendum)
  Colonoscopy Discharge Instructions  Read the instructions outlined below and refer to this sheet in the next few weeks. These discharge instructions provide you with general information on caring for yourself after you leave the hospital. Your doctor may also give you specific instructions. While your treatment has been planned according to the most current medical practices available, unavoidable complications occasionally occur. If you have any problems or questions after discharge, call Dr. Jena Gauss at [phone]. ACTIVITY You may resume your regular activity, but move at a slower pace for the next 24 hours.  Take frequent rest periods for the next 24 hours.  Walking will help get rid of the air and reduce the bloated feeling in your belly (abdomen).  No driving for 24 hours (because of the medicine (anesthesia) used during the test).   Do not sign any important legal documents or operate any machinery for 24 hours (because of the anesthesia used during the test).  NUTRITION Drink plenty of fluids.  You may resume your normal diet as instructed by your doctor.  Begin with a light meal and progress to your normal diet. Heavy or fried foods are harder to digest and may make you feel sick to your stomach (nauseated).  Avoid alcoholic beverages for 24 hours or as instructed.  MEDICATIONS You may resume your normal medications unless your doctor tells you otherwise.  WHAT YOU CAN EXPECT TODAY Some feelings of bloating in the abdomen.  Passage of more gas than usual.  Spotting of blood in your stool or on the toilet paper.  IF YOU HAD POLYPS REMOVED DURING THE COLONOSCOPY: No aspirin products for 7 days or as instructed.  No alcohol for 7 days or as instructed.  Eat a soft diet for the next 24 hours.  FINDING OUT THE RESULTS OF YOUR TEST Not all test results are available during your visit. If your test results are not back during the visit, make an appointment with your caregiver to find out the  results. Do not assume everything is normal if you have not heard from your caregiver or the medical facility. It is important for you to follow up on all of your test results.  SEEK IMMEDIATE MEDICAL ATTENTION IF: You have more than a spotting of blood in your stool.  Your belly is swollen (abdominal distention).  You are nauseated or vomiting.  You have a temperature over 101.  You have abdominal pain or discomfort that is severe or gets worse throughout the day.    No polyps found today  Diverticulosis information provided  A future colonoscopy is not recommended unless new symptoms develop  Get patient request, I called Delynn Flavin at [phone] -reviewed findings and recommendations

## 2021-07-08 ENCOUNTER — Encounter (HOSPITAL_COMMUNITY): Payer: Self-pay | Admitting: Internal Medicine

## 2021-07-27 ENCOUNTER — Encounter: Payer: Self-pay | Admitting: Orthopaedic Surgery

## 2021-07-27 ENCOUNTER — Ambulatory Visit (INDEPENDENT_AMBULATORY_CARE_PROVIDER_SITE_OTHER): Payer: Medicare HMO | Admitting: Orthopaedic Surgery

## 2021-07-27 DIAGNOSIS — G8929 Other chronic pain: Secondary | ICD-10-CM | POA: Diagnosis not present

## 2021-07-27 DIAGNOSIS — M25511 Pain in right shoulder: Secondary | ICD-10-CM

## 2021-07-27 MED ORDER — HYDROCODONE-ACETAMINOPHEN 5-325 MG PO TABS: ORAL_TABLET | ORAL | 0 refills | Status: DC

## 2021-07-27 NOTE — Progress Notes (Signed)
PROCEDURE NOTE:  The patient request injection, verbal consent was obtained.  The right shoulder was prepped appropriately after time out was performed.   Sterile technique was observed and injection of 1 cc of DepoMedrol 40mg  with several cc's of plain xylocaine. Anesthesia was provided by ethyl chloride and a 20-gauge needle was used to inject the shoulder area. A posterior approach was used.  The injection was tolerated well.  A band aid dressing was applied.  The patient was advised to apply ice later today and tomorrow to the injection sight as needed.  Encounter Diagnosis  Name Primary?   Chronic right shoulder pain Yes   Return prn  I have reviewed the Controlled Substance Reporting System web site prior to prescribing narcotic medicine for this patient.  Call if any problem.  Precautions discussed.  Electronically Signed [place], MD 7/11/202310:49 AM

## 2021-09-08 ENCOUNTER — Other Ambulatory Visit: Payer: Self-pay | Admitting: Orthopaedic Surgery

## 2021-09-08 MED ORDER — HYDROCODONE-ACETAMINOPHEN 5-325 MG PO TABS: ORAL_TABLET | ORAL | 0 refills | Status: DC

## 2021-09-08 NOTE — Telephone Encounter (Signed)
Patient came in the office and wants another refill on his pain medicine   HYDROcodone-acetaminophen (NORCO/VICODIN) 5-325 MG tablet  Pharmacy [facility] on [address]

## 2021-09-24 ENCOUNTER — Encounter: Payer: Self-pay | Admitting: Emergency Medicine

## 2021-09-24 ENCOUNTER — Ambulatory Visit (INDEPENDENT_AMBULATORY_CARE_PROVIDER_SITE_OTHER): Payer: Medicare HMO

## 2021-09-24 ENCOUNTER — Ambulatory Visit
Admission: EM | Admit: 2021-09-24 | Discharge: 2021-09-24 | Disposition: A | Discharge status: Home / Self Care | Payer: Medicare HMO | Attending: Emergency Medicine | Admitting: Emergency Medicine

## 2021-09-24 DIAGNOSIS — M542 Cervicalgia: Secondary | ICD-10-CM

## 2021-09-24 DIAGNOSIS — Z0189 Encounter for other specified special examinations: Secondary | ICD-10-CM | POA: Diagnosis not present

## 2021-09-24 MED ORDER — MELOXICAM 15 MG PO TABS: 15.0000 mg | ORAL_TABLET | Freq: Every day | ORAL | 0 refills | Status: DC

## 2021-09-24 MED ORDER — HYDROCODONE-ACETAMINOPHEN 5-325 MG PO TABS: 2.0000 | ORAL_TABLET | ORAL | 0 refills | Status: DC | PRN

## 2021-09-24 NOTE — Discharge Instructions (Addendum)
X-ray today showed no acute fracture or dislocation.  This appears to be muscular in nature.  Take Norco as needed for pain along with topical IcyHot or Biofreeze and moist heat.  Anti-inflammatories may also be helpful.  Return for any new or worsening symptoms at any time for reevaluation-Go to the ER if worse for further diagnostic and pain treatment
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
## 2021-09-24 NOTE — ED Triage Notes (Signed)
Pain from right shoulder that moves up right side of neck to head.  States he was cutting fire wood last week and thinks he may have pulled a muscle.  Has tried arthritis rub that helped for a little while.  Has been taking prescribed pain pills to help with pain, with little relief.

## 2021-09-28 ENCOUNTER — Ambulatory Visit (INDEPENDENT_AMBULATORY_CARE_PROVIDER_SITE_OTHER): Payer: Medicare HMO | Admitting: Orthopaedic Surgery

## 2021-09-28 ENCOUNTER — Encounter: Payer: Self-pay | Admitting: Orthopaedic Surgery

## 2021-09-28 VITALS — BP 149/80 | HR 60 | Ht 70.0 in | Wt 147.2 lb

## 2021-09-28 DIAGNOSIS — M542 Cervicalgia: Secondary | ICD-10-CM | POA: Diagnosis not present

## 2021-09-28 MED ORDER — MELOXICAM 15 MG PO TABS: ORAL_TABLET | ORAL | 5 refills | Status: DC

## 2021-09-28 MED ORDER — HYDROCODONE-ACETAMINOPHEN 5-325 MG PO TABS: ORAL_TABLET | ORAL | 0 refills | Status: DC

## 2021-09-28 NOTE — Progress Notes (Signed)
My neck hurts.  He went to Urgent Care about his neck pain 09-24-21 and had X-rays done which showed: IMPRESSION: 1. No acute fracture. 2. Extensive spinal degenerative changes with multilevel foraminal narrowing, greatest on the LEFT. 3. No dedicated craniocervical junction assessment in the AP projection appears normal on lateral view. If there is history of trauma could consider acquiring these views for further evaluation as warranted.  He was given Mobic 15 which really helped.  He has neck pain localized to the right side with some paresthesias to the long and index fingers.  He has no trauma.  I will get MRI of the cervical spine.  I will refill his Mobic and his pain medicine.  Neck has good ROM but pain on the right lower side and right upper trapezius.  ROM of shoulder is tender but full.  Grips slightly less on the right, right hand dominant.  Triceps slightly weaker on the right.  Encounter Diagnosis  Name Primary?   Cervicalgia Yes   Return in three weeks after the MRI.  Call if any problem.  Precautions discussed.  I have reviewed the [place] Controlled Substance Reporting System web site prior to prescribing narcotic medicine for this patient.    Electronically Signed Darreld Mclean, MD 9/12/20238:53 AM

## 2021-09-28 NOTE — Patient Instructions (Signed)
While we are working on your approval please go ahead and call to schedule your appointment with [facility] in at least 2 weeks.    Central Scheduling [phone]  AFTER you have made your imaging appointment, please call our office back at [phone] to schedule an appointment to review your results. Your follow up appointment will need to be 3-4 days after your imaging is performed to allow us time to get the report. If you are having your imaging performed in a facility not associated with [facility], you will need to ask for a CD with your images on them.   

## 2021-10-14 ENCOUNTER — Ambulatory Visit (HOSPITAL_COMMUNITY)
Admission: RE | Admit: 2021-10-14 | Discharge: 2021-10-14 | Disposition: A | Discharge status: Home / Self Care | Payer: Medicare HMO | Source: Ambulatory Visit | Attending: Orthopaedic Surgery | Admitting: Orthopaedic Surgery

## 2021-10-14 DIAGNOSIS — M542 Cervicalgia: Secondary | ICD-10-CM | POA: Insufficient documentation

## 2021-10-20 ENCOUNTER — Ambulatory Visit (INDEPENDENT_AMBULATORY_CARE_PROVIDER_SITE_OTHER): Payer: Medicare HMO | Admitting: Orthopaedic Surgery

## 2021-10-20 ENCOUNTER — Encounter: Payer: Self-pay | Admitting: Orthopaedic Surgery

## 2021-10-20 DIAGNOSIS — M542 Cervicalgia: Secondary | ICD-10-CM | POA: Diagnosis not present

## 2021-10-20 MED ORDER — HYDROCODONE-ACETAMINOPHEN 5-325 MG PO TABS: ORAL_TABLET | ORAL | 0 refills | Status: DC

## 2021-10-20 NOTE — Progress Notes (Signed)
My neck is still hurting.  He has chronic neck pain with right sided paresthesias.  He says he is no better.  He had MRI showing: IMPRESSION: 1. C3-C4 and C4-C5 mild spinal canal stenosis with severe left and moderate right neural foraminal narrowing. 2. C2-C3 mild spinal canal stenosis and mild right neural foraminal narrowing. 3. C6-C7 moderate left and mild right neural foraminal narrowing. 4. C5-C6 mild left neural foraminal narrowing.   I have explained the findings to him.  I will have him see neurosurgeon.  I have independently reviewed the MRI.      ROM of neck is tender but full.  NV intact.  Reflexes intact.  Encounter Diagnosis  Name Primary?   Cervicalgia Yes   To neurosurgery.  I have reviewed the [facility] web site prior to prescribing narcotic medicine for this patient.  Return in six weeks.  Call if any problem.  Precautions discussed.  Electronically Signed Sanjuana Kava, MD 10/4/20238:24 AM

## 2021-10-20 NOTE — Patient Instructions (Signed)
You have been referred to [facility] on [address] in [place], they will call you with appointment. If you have not heard anything in one week call them to schedule [phone]   

## 2021-11-14 ENCOUNTER — Other Ambulatory Visit: Payer: Self-pay

## 2021-11-14 ENCOUNTER — Emergency Department (HOSPITAL_COMMUNITY): Payer: Medicare HMO

## 2021-11-14 ENCOUNTER — Encounter (HOSPITAL_COMMUNITY): Payer: Self-pay | Admitting: *Deleted

## 2021-11-14 ENCOUNTER — Emergency Department (HOSPITAL_COMMUNITY)
Admission: EM | Admit: 2021-11-14 | Discharge: 2021-11-14 | Disposition: A | Discharge status: Home / Self Care | Payer: Medicare HMO | Attending: Emergency Medicine | Admitting: Emergency Medicine

## 2021-11-14 DIAGNOSIS — R197 Diarrhea, unspecified: Secondary | ICD-10-CM | POA: Insufficient documentation

## 2021-11-14 DIAGNOSIS — Z7982 Long term (current) use of aspirin: Secondary | ICD-10-CM | POA: Diagnosis not present

## 2021-11-14 DIAGNOSIS — R112 Nausea with vomiting, unspecified: Secondary | ICD-10-CM | POA: Diagnosis present

## 2021-11-14 LAB — COMPREHENSIVE METABOLIC PANEL
ALT: 14 U/L (ref 0–44)
AST: 18 U/L (ref 15–41)
Albumin: 4.2 g/dL (ref 3.5–5.0)
Alkaline Phosphatase: 68 U/L (ref 38–126)
Anion gap: 12 (ref 5–15)
BUN: 20 mg/dL (ref 8–23)
CO2: 18 mmol/L — ABNORMAL LOW (ref 22–32)
Calcium: 9.1 mg/dL (ref 8.9–10.3)
Chloride: 108 mmol/L (ref 98–111)
Creatinine, Ser: 0.6 mg/dL — ABNORMAL LOW (ref 0.61–1.24)
GFR, Estimated: >60 mL/min (ref >60)
Glucose, Bld: 139 mg/dL — ABNORMAL HIGH (ref 70–99)
Potassium: 4.2 mmol/L (ref 3.5–5.1)
Sodium: 138 mmol/L (ref 135–145)
Total Bilirubin: 0.7 mg/dL (ref 0.3–1.2)
Total Protein: 7.8 g/dL (ref 6.5–8.1)

## 2021-11-14 LAB — CBC WITH DIFFERENTIAL/PLATELET
Abs Immature Granulocytes: 0.05 10*3/uL (ref 0.00–0.07)
Basophils Absolute: 0 10*3/uL (ref 0.0–0.1)
Basophils Relative: 0 %
Eosinophils Absolute: 0 10*3/uL (ref 0.0–0.5)
Eosinophils Relative: 0 %
HCT: 44.2 % (ref 39.0–52.0)
Hemoglobin: 14.2 g/dL (ref 13.0–17.0)
Immature Granulocytes: 1 %
Lymphocytes Relative: 5 %
Lymphs Abs: 0.6 10*3/uL — ABNORMAL LOW (ref 0.7–4.0)
MCH: 29.2 pg (ref 26.0–34.0)
MCHC: 32.1 g/dL (ref 30.0–36.0)
MCV: 90.8 fL (ref 80.0–100.0)
Monocytes Absolute: 0.4 10*3/uL (ref 0.1–1.0)
Monocytes Relative: 4 %
Neutro Abs: 9.7 10*3/uL — ABNORMAL HIGH (ref 1.7–7.7)
Neutrophils Relative %: 90 %
Platelets: 213 10*3/uL (ref 150–400)
RBC: 4.87 MIL/uL (ref 4.22–5.81)
RDW: 14.6 % (ref 11.5–15.5)
WBC: 10.7 10*3/uL — ABNORMAL HIGH (ref 4.0–10.5)
nRBC: 0 % (ref 0.0–0.2)

## 2021-11-14 LAB — URINALYSIS, ROUTINE W REFLEX MICROSCOPIC
Bilirubin Urine: NEGATIVE
Glucose, UA: 50 mg/dL — AB
Hgb urine dipstick: NEGATIVE
Ketones, ur: 80 mg/dL — AB
Leukocytes,Ua: NEGATIVE
Nitrite: NEGATIVE
Protein, ur: 30 mg/dL — AB
Specific Gravity, Urine: 1.026 (ref 1.005–1.030)
pH: 5 (ref 5.0–8.0)

## 2021-11-14 LAB — LIPASE, BLOOD: Lipase: 29 U/L (ref 11–51)

## 2021-11-14 MED ORDER — HYDROMORPHONE HCL 1 MG/ML IJ SOLN
0.5000 mg | Freq: Once | INTRAMUSCULAR | Status: AC
  Administered 2021-11-14: 0.5 mg via INTRAVENOUS
  Filled 2021-11-14: qty 0.5

## 2021-11-14 MED ORDER — ONDANSETRON HCL 4 MG/2ML IJ SOLN
4.0000 mg | Freq: Once | INTRAMUSCULAR | Status: AC
  Administered 2021-11-14: 4 mg via INTRAVENOUS
  Filled 2021-11-14: qty 2

## 2021-11-14 MED ORDER — SODIUM CHLORIDE 0.9 % IV BOLUS
1000.0000 mL | Freq: Once | INTRAVENOUS | Status: AC
  Administered 2021-11-14: 1000 mL via INTRAVENOUS

## 2021-11-14 MED ORDER — ONDANSETRON 4 MG PO TBDP: ORAL_TABLET | ORAL | 0 refills | Status: DC

## 2021-11-14 MED ORDER — IOHEXOL 300 MG/ML  SOLN
100.0000 mL | Freq: Once | INTRAMUSCULAR | Status: AC | PRN
  Administered 2021-11-14: 100 mL via INTRAVENOUS

## 2021-11-14 NOTE — ED Notes (Signed)
ED Provider at bedside. 

## 2021-11-14 NOTE — ED Provider Notes (Signed)
Patient vomited at time of discharge.  I went back in and examined him and talk to him about his nausea.  I told him that we could admit him to the hospital and treat his nausea here but he wanted to go home and use the medicine and return if problems   Milton Ferguson, MD 11/14/21 2037

## 2021-11-14 NOTE — ED Provider Notes (Signed)
[facility] EMERGENCY DEPARTMENT Provider Note   CSN: [id number] Arrival date & time: 11/14/21  1344     History {Add pertinent medical, surgical, social history, OB history to HPI:1} Chief Complaint  Patient presents with   Emesis    [name] is a 71 y.o. male.  Patient has a history of peripheral vascular disease.  Patient complains of vomiting and diarrhea.  Some abdominal pain   Emesis      Home Medications Prior to Admission medications   Medication Sig Start Date End Date Taking? Authorizing Provider  ondansetron (ZOFRAN-ODT) 4 MG disintegrating tablet 4mg  ODT q4 hours prn nausea/vomit 11/14/21  Yes 11/16/21, MD  aspirin EC 81 MG tablet Take 81 mg by mouth daily.    [provider]  HYDROcodone-acetaminophen (NORCO/VICODIN) 5-325 MG tablet One tablet every six hours for pain.  Limit 7 days. 10/20/21   12/20/21, MD  linagliptin (TRADJENTA) 5 MG TABS tablet Take 5 mg by mouth daily.    [provider]  lisinopril (ZESTRIL) 5 MG tablet Take 5 mg by mouth daily.    [provider]  meloxicam (MOBIC) 15 MG tablet Take one tablet daily. 09/28/21   11/28/21, MD  Menthol, Topical Analgesic, (ICY HOT EX) Apply 1 application. topically daily as needed (pain).    [provider]  metFORMIN (GLUCOPHAGE) 500 MG tablet Take 500 mg by mouth 2 (two) times daily. 03/31/20   [provider]  metoprolol tartrate (LOPRESSOR) 25 MG tablet Take 0.5 tablets (12.5 mg total) by mouth 2 (two) times daily. 11/13/17   11/15/17, MD  nitroGLYCERIN (NITROSTAT) 0.4 MG SL tablet Place 1 tablet (0.4 mg total) under the tongue every 5 (five) minutes as needed for chest pain. 02/08/21   02/10/21, MD  ondansetron (ZOFRAN) 4 MG tablet Take 1 tablet (4 mg total) by mouth every 6 (six) hours as needed for nausea. 04/06/21   04/08/21, MD  pantoprazole (PROTONIX) 40 MG tablet Take 1 tablet (40 mg total) by mouth  daily. Patient taking differently: Take 40 mg by mouth daily as needed (acid reflux). 04/06/21   04/08/21, MD  polyethylene glycol-electrolytes (NULYTELY) 420 g solution As directed 04/21/21   Rourk, 06/21/21, MD  simvastatin (ZOCOR) 40 MG tablet Take 40 mg by mouth at bedtime.    [provider]  tamsulosin (FLOMAX) 0.4 MG CAPS capsule Take 0.4 mg by mouth in the morning and at bedtime.    [provider]  TRESIBA FLEXTOUCH 100 UNIT/ML FlexTouch Pen Inject 15 Units into the skin every morning. Patient taking differently: Inject 20 Units into the skin every morning. 04/06/21   04/08/21, MD      Allergies    Patient has no known allergies.    Review of Systems   Review of Systems  Gastrointestinal:  Positive for vomiting.    Physical Exam Updated Vital Signs BP 122/72 (BP Location: Left Arm)   Pulse 63   Temp 97.8 F (36.6 C) (Oral)   Resp 12   Ht 5\' 10"  (1.778 m)   Wt 66.7 kg   SpO2 94%   BMI 21.09 kg/m  Physical Exam  ED Results / Procedures / Treatments   Labs (all labs ordered are listed, but only abnormal results are displayed) Labs Reviewed  CBC WITH DIFFERENTIAL/PLATELET - Abnormal; Notable for the following components:      Result Value   WBC 10.7 (*)  Neutro Abs 9.7 (*)    Lymphs Abs 0.6 (*)    All other components within normal limits  COMPREHENSIVE METABOLIC PANEL - Abnormal; Notable for the following components:   CO2 18 (*)    Glucose, Bld 139 (*)    Creatinine, Ser 0.60 (*)    All other components within normal limits  URINALYSIS, ROUTINE W REFLEX MICROSCOPIC - Abnormal; Notable for the following components:   Glucose, UA 50 (*)    Ketones, ur 80 (*)    Protein, ur 30 (*)    Bacteria, UA RARE (*)    All other components within normal limits  LIPASE, BLOOD    EKG None  Radiology CT ABDOMEN PELVIS W CONTRAST  Result Date: 11/14/2021 CLINICAL DATA:  Abdominal pain, vomiting, diarrhea. EXAM: CT ABDOMEN AND  PELVIS WITH CONTRAST TECHNIQUE: Multidetector CT imaging of the abdomen and pelvis was performed using the standard protocol following bolus administration of intravenous contrast. RADIATION DOSE REDUCTION: This exam was performed according to the departmental dose-optimization program which includes automated exposure control, adjustment of the mA and/or kV according to patient size and/or use of iterative reconstruction technique. CONTRAST:  129mL OMNIPAQUE IOHEXOL 300 MG/ML  SOLN COMPARISON:  CT abdomen pelvis dated 04/05/2021 10/17/2020. FINDINGS: Lower chest: No acute abnormality. Hepatobiliary: No focal liver abnormality is seen. No gallstones, gallbladder wall thickening, or biliary dilatation. Pancreas: Unremarkable. No pancreatic ductal dilatation or surrounding inflammatory changes. Spleen: Normal in size without focal abnormality. Adrenals/Urinary Tract: A benign right adrenal adenoma measures 1.5 cm, unchanged. No imaging follow-up is recommended for this finding. The left adrenal gland appears normal. Kidneys are normal, without renal calculi, focal lesion, or hydronephrosis. Bladder is unremarkable. Stomach/Bowel: Stomach is within normal limits. Appendix appears normal. No evidence of bowel wall thickening, distention, or inflammatory changes. Vascular/Lymphatic: The patient is status post an aorto bi iliac stent graft with the origin just inferior to the renal arteries. The stent graft supplying the left common iliac artery appears completely thrombosed. Inflow to the right lower extremity is intact, whereas the left internal and left external iliac arteries as well as the left common femoral artery are not opacified. A few collateral vessels supply the left lower extremity. 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 Atherosclerotic calcifications are seen in the suprarenal abdominal aorta. No enlarged abdominal or pelvic lymph nodes. Reproductive:  Prostate is unremarkable. Other: No abdominal wall hernia or abnormality. No abdominopelvic ascites. Musculoskeletal: There is a cortical defect of an osteophyte arising from the right anterior aspect of the L5 vertebral body (series 7, image 55) which is new since 04/05/2021 and age indeterminate. Degenerative changes are seen in the spine. IMPRESSION: 1. 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 Inflow to the right lower extremity is intact, whereas the left internal and external iliac arteries as well as the left common femoral artery are not opacified. A few collateral vessels supply the left lower extremity. 2. Age-indeterminate cortical defect of an osteophyte arising from the right anterior aspect of the L5 vertebral body. Aortic Atherosclerosis (ICD10-I70.0). These results were called by telephone at the time of interpretation on 11/14/2021 at 5:50 pm to provider [facility] Lerin Jech , who verbally acknowledged these results. Electronically Signed   By: Zerita Boers M.D.   On: 11/14/2021 17:52    Procedures Procedures  {Document cardiac monitor, telemetry assessment procedure when appropriate:1}  Medications Ordered in ED Medications  sodium chloride  0.9 % bolus 1,000 mL (0 mLs Intravenous Stopped 11/14/21 1912)  ondansetron (ZOFRAN) injection 4 mg (4 mg Intravenous Given 11/14/21 1534)  HYDROmorphone (DILAUDID) injection 0.5 mg (0.5 mg Intravenous Given 11/14/21 1549)  iohexol (OMNIPAQUE) 300 MG/ML solution 100 mL (100 mLs Intravenous Contrast Given 11/14/21 1721)    ED Course/ Medical Decision Making/ A&P  Patient with gastroenteritis.  He improved with fluids and nausea medicine.  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 Radiology: ordered.  Risk Prescription drug  management.   Gastroenteritis.  Patient given Zofran and will follow-up with PCP history of occluded left common iliac artery.  He will follow back up with vascular surgery  {Document critical care time when appropriate:1} {Document review of labs and clinical decision tools ie heart score, Chads2Vasc2 etc:1}  {Document your independent review of radiology images, and any outside records:1} {Document your discussion with family members, caretakers, and with consultants:1} {Document social determinants of health affecting pt's care:1} {Document your decision making why or why not admission, treatments were needed:1} Final Clinical Impression(s) / ED Diagnoses Final diagnoses:  Nausea vomiting and diarrhea    Rx / DC Orders ED Discharge Orders          Ordered    ondansetron (ZOFRAN-ODT) 4 MG disintegrating tablet        11/14/21 1927

## 2021-11-14 NOTE — ED Notes (Signed)
Pt is getting dressed.

## 2021-11-14 NOTE — ED Triage Notes (Signed)
Pt c/o vomiting, diarrhea that started last night along with abd pain.

## 2021-11-14 NOTE — Discharge Instructions (Signed)
Drink plenty of fluids.  Take Tylenol or Motrin for pain.  Follow-up with your family doctor this week and also you are referred to a vascular surgeon to discuss the findings on your graft that was done in your pelvis.

## 2021-11-23 ENCOUNTER — Telehealth: Payer: Self-pay | Admitting: Orthopaedic Surgery

## 2021-11-23 MED ORDER — HYDROCODONE-ACETAMINOPHEN 5-325 MG PO TABS: ORAL_TABLET | ORAL | 0 refills | Status: DC

## 2021-11-23 NOTE — Telephone Encounter (Addendum)
Patient came in and wants his pain medicine filled.  I advised him he was to follow up with the neurosurgeon.  He said he did and they said not to do anything .... He wants his pain medicine filled. I advised him Dr. Luna Glasgow might want to see him before refilling his pain medicine.     All he wants is his pain medicine filled.    [facility] on [address]    Patient refused to make appt to come back and see Dr. Luna Glasgow!  All he wants is his pain medicine he said that is it!

## 2021-12-13 ENCOUNTER — Telehealth: Payer: Self-pay | Admitting: Licensed Clinical Social Worker

## 2021-12-13 ENCOUNTER — Telehealth: Payer: Self-pay | Admitting: Internal Medicine

## 2021-12-13 ENCOUNTER — Telehealth: Payer: Self-pay

## 2021-12-13 MED ORDER — HYDROCODONE-ACETAMINOPHEN 5-325 MG PO TABS: ORAL_TABLET | ORAL | 0 refills | Status: DC

## 2021-12-13 NOTE — Telephone Encounter (Signed)
Request sent to provider.

## 2021-12-13 NOTE — Telephone Encounter (Signed)
Hydrocodone-Acetaminophen 5/325 MG  Qty 28 Tablets  PATIENT USES [facility] ON [address] 

## 2021-12-13 NOTE — Telephone Encounter (Signed)
H&V Care Navigation CSW Progress Note  Clinical Social Worker contacted patient by phone to f/u on request for assistance with gas. LCSW was able to reach pt at [phone]. Pt shares that he isnt sure if he has any credit left on his account, has been able to use tanks he has purchased. Unclear about what specific amount of gas etc he needs. [name] contacted. Pt has small amount of credit left, he was informed of the above- he was updated as he is annually that he needs to go and apply for [employer] assistance which he has been approved for the past 2 years. Pt provided with information on what to bring and how to apply in person at DSS in [place]. I will f/u next week to make sure he has gone to apply as funding opens 12/17/21 (this Friday). Pt okay with this plan.   Pt doing okay otherwise, still driving, still receiving assistance for food and other utilities from [facility]. He continues to live alone. Has Medicare and Medicaid so no issues with cost of care/medications at this time.   Patient is participating in a Managed Medicaid Plan:  No, [employer] Medicare and Medicaid.   SDOH Screenings   Food Insecurity: No Food Insecurity (12/13/2021)  Housing: Low Risk  (12/13/2021)  Transportation Needs: No Transportation Needs (12/13/2021)  Utilities: Not At Risk (12/13/2021)  Financial Resource Strain: Medium Risk (12/13/2021)  Tobacco Use: Medium Risk (11/14/2021)    [name], MSW, LCSW Clinical Social Worker II [facility] Heart/Vascular Care Navigation  [phone]- work cell phone (preferred) [phone]- desk phone

## 2021-12-13 NOTE — Telephone Encounter (Signed)
Patient walked in requesting help with propane fill up.  This is something we have helped in the past with.  Please call patient for more details.

## 2021-12-23 ENCOUNTER — Telehealth: Payer: Self-pay | Admitting: Licensed Clinical Social Worker

## 2021-12-23 NOTE — Telephone Encounter (Signed)
H&V Care Navigation CSW Progress Note  Clinical Social Worker contacted patient by phone to f/u on LIE[employer] application. Pt shares he went to [employer] [place]) and signed up for program. He provided them with gas company number and they will coordinate assistance for pt. Pt encouraged to call us if any issues/we can be of any additional assistance.   Patient is participating in a Managed Medicaid Plan: No, [employer] Medicare and Medicaid.    SDOH Screenings   Food Insecurity: No Food Insecurity (12/13/2021)  Housing: Low Risk  (12/13/2021)  Transportation Needs: No Transportation Needs (12/13/2021)  Utilities: Not At Risk (12/13/2021)  Financial Resource Strain: Medium Risk (12/13/2021)  Tobacco Use: Medium Risk (11/14/2021)   Octavio Graves, MSW, LCSW Clinical Social Worker II [facility] Heart/Vascular Care Navigation  [phone]- work cell phone (preferred) [phone]- desk phone

## 2022-01-13 ENCOUNTER — Other Ambulatory Visit: Payer: Self-pay | Admitting: Orthopaedic Surgery

## 2022-01-13 MED ORDER — HYDROCODONE-ACETAMINOPHEN 5-325 MG PO TABS: ORAL_TABLET | ORAL | 0 refills | Status: DC

## 2022-01-13 NOTE — Telephone Encounter (Signed)
Patient needs a refill for his pain medicine.  He can not wait till next Tuesday to get it filled he is completely out he said    HYDROcodone-acetaminophen (NORCO/VICODIN) 5-325 MG tablet    Pharmacy: [facility] on [address]

## 2022-02-24 ENCOUNTER — Telehealth: Payer: Self-pay | Admitting: Orthopaedic Surgery

## 2022-02-24 ENCOUNTER — Ambulatory Visit: Payer: Medicare HMO | Attending: Internal Medicine | Admitting: Internal Medicine

## 2022-02-24 VITALS — BP 166/98 | HR 83 | Ht 70.0 in | Wt 148.2 lb

## 2022-02-24 DIAGNOSIS — Z952 Presence of prosthetic heart valve: Secondary | ICD-10-CM

## 2022-02-24 MED ORDER — ROSUVASTATIN CALCIUM 20 MG PO TABS: 20.0000 mg | ORAL_TABLET | Freq: Every day | ORAL | 3 refills | Status: AC

## 2022-02-24 MED ORDER — NITROGLYCERIN 0.4 MG SL SUBL
0.4000 mg | SUBLINGUAL_TABLET | SUBLINGUAL | 12 refills | Status: AC | PRN
Start: 2022-02-24 — End: ?

## 2022-02-24 NOTE — Telephone Encounter (Signed)
VOID

## 2022-02-24 NOTE — Patient Instructions (Signed)
Medication Instructions:  Your physician has recommended you make the following change in your medication:  - Stop Simvastatin - Start Crestor 20 mg tablet once daily   Labwork: None  Testing/Procedures: Your physician has requested that you have an echocardiogram. Echocardiography is a painless test that uses sound waves to create images of your heart. It provides your doctor with information about the size and shape of your heart and how well your heart's chambers and valves are working. This procedure takes approximately one hour. There are no restrictions for this procedure. Please do NOT wear cologne, perfume, aftershave, or lotions (deodorant is allowed). Please arrive 15 minutes prior to your appointment time.   Follow-Up: Follow up with Dr. Dellia Cloud in 1 year.  Any Other Special Instructions Will Be Listed Below (If Applicable).     If you need a refill on your cardiac medications before your next appointment, please call your pharmacy.
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
## 2022-03-08 ENCOUNTER — Telehealth: Payer: Self-pay

## 2022-03-08 MED ORDER — HYDROCODONE-ACETAMINOPHEN 5-325 MG PO TABS: ORAL_TABLET | ORAL | 0 refills | Status: DC

## 2022-03-08 NOTE — Telephone Encounter (Signed)
Hydrocodone-Acetaminophen 5/325 MG   Qty 25 Tablets  PATIENT USES [facility] ON [address]

## 2022-03-22 ENCOUNTER — Other Ambulatory Visit (HOSPITAL_COMMUNITY): Payer: Medicare HMO

## 2022-04-20 ENCOUNTER — Telehealth: Payer: Self-pay

## 2022-04-20 MED ORDER — HYDROCODONE-ACETAMINOPHEN 5-325 MG PO TABS: ORAL_TABLET | ORAL | 0 refills | Status: DC

## 2022-04-20 NOTE — Telephone Encounter (Signed)
Hydrocodone-Acetaminophen 5/325 MG   Qty 25 Tablets  PATIENT USES [facility] ON [address] 

## 2022-05-10 ENCOUNTER — Observation Stay (HOSPITAL_COMMUNITY)
Admission: EM | Admit: 2022-05-10 | Discharge: 2022-05-12 | Disposition: A | Discharge status: Home / Self Care | Payer: Medicare HMO | Attending: Family Medicine | Admitting: Family Medicine

## 2022-05-10 ENCOUNTER — Emergency Department (HOSPITAL_COMMUNITY): Payer: Medicare HMO

## 2022-05-10 ENCOUNTER — Encounter (HOSPITAL_COMMUNITY): Payer: Self-pay

## 2022-05-10 ENCOUNTER — Other Ambulatory Visit: Payer: Self-pay

## 2022-05-10 DIAGNOSIS — Z7982 Long term (current) use of aspirin: Secondary | ICD-10-CM | POA: Diagnosis not present

## 2022-05-10 DIAGNOSIS — Z23 Encounter for immunization: Secondary | ICD-10-CM | POA: Diagnosis not present

## 2022-05-10 DIAGNOSIS — E1165 Type 2 diabetes mellitus with hyperglycemia: Secondary | ICD-10-CM | POA: Diagnosis not present

## 2022-05-10 DIAGNOSIS — R112 Nausea with vomiting, unspecified: Secondary | ICD-10-CM | POA: Diagnosis present

## 2022-05-10 DIAGNOSIS — Z7984 Long term (current) use of oral hypoglycemic drugs: Secondary | ICD-10-CM | POA: Insufficient documentation

## 2022-05-10 DIAGNOSIS — Z79899 Other long term (current) drug therapy: Secondary | ICD-10-CM | POA: Insufficient documentation

## 2022-05-10 DIAGNOSIS — R1010 Upper abdominal pain, unspecified: Principal | ICD-10-CM | POA: Insufficient documentation

## 2022-05-10 DIAGNOSIS — R1084 Generalized abdominal pain: Secondary | ICD-10-CM | POA: Diagnosis not present

## 2022-05-10 DIAGNOSIS — Z87891 Personal history of nicotine dependence: Secondary | ICD-10-CM | POA: Insufficient documentation

## 2022-05-10 DIAGNOSIS — R109 Unspecified abdominal pain: Secondary | ICD-10-CM | POA: Diagnosis present

## 2022-05-10 DIAGNOSIS — E782 Mixed hyperlipidemia: Secondary | ICD-10-CM | POA: Diagnosis not present

## 2022-05-10 DIAGNOSIS — I2510 Atherosclerotic heart disease of native coronary artery without angina pectoris: Secondary | ICD-10-CM | POA: Diagnosis not present

## 2022-05-10 DIAGNOSIS — I10 Essential (primary) hypertension: Secondary | ICD-10-CM | POA: Insufficient documentation

## 2022-05-10 DIAGNOSIS — N400 Enlarged prostate without lower urinary tract symptoms: Secondary | ICD-10-CM | POA: Insufficient documentation

## 2022-05-10 LAB — CBC
HCT: 45.9 % (ref 39.0–52.0)
Hemoglobin: 15 g/dL (ref 13.0–17.0)
MCH: 28.8 pg (ref 26.0–34.0)
MCHC: 32.7 g/dL (ref 30.0–36.0)
MCV: 88.3 fL (ref 80.0–100.0)
Platelets: 175 10*3/uL (ref 150–400)
RBC: 5.2 MIL/uL (ref 4.22–5.81)
RDW: 14.5 % (ref 11.5–15.5)
WBC: 13 10*3/uL — ABNORMAL HIGH (ref 4.0–10.5)
nRBC: 0 % (ref 0.0–0.2)

## 2022-05-10 LAB — COMPREHENSIVE METABOLIC PANEL
ALT: 17 U/L (ref 0–44)
AST: 24 U/L (ref 15–41)
Albumin: 4.4 g/dL (ref 3.5–5.0)
Alkaline Phosphatase: 69 U/L (ref 38–126)
Anion gap: 13 (ref 5–15)
BUN: 11 mg/dL (ref 8–23)
CO2: 22 mmol/L (ref 22–32)
Calcium: 9.5 mg/dL (ref 8.9–10.3)
Chloride: 101 mmol/L (ref 98–111)
Creatinine, Ser: 0.77 mg/dL (ref 0.61–1.24)
GFR, Estimated: >60 mL/min (ref >60)
Glucose, Bld: 206 mg/dL — ABNORMAL HIGH (ref 70–99)
Potassium: 4.4 mmol/L (ref 3.5–5.1)
Sodium: 136 mmol/L (ref 135–145)
Total Bilirubin: 0.7 mg/dL (ref 0.3–1.2)
Total Protein: 8.1 g/dL (ref 6.5–8.1)

## 2022-05-10 LAB — TROPONIN I (HIGH SENSITIVITY)
Troponin I (High Sensitivity): 7 ng/L (ref <18)
Troponin I (High Sensitivity): 9 ng/L (ref <18)

## 2022-05-10 LAB — URINALYSIS, ROUTINE W REFLEX MICROSCOPIC
Bilirubin Urine: NEGATIVE
Glucose, UA: >=500 mg/dL — AB
Hgb urine dipstick: NEGATIVE
Ketones, ur: 80 mg/dL — AB
Leukocytes,Ua: NEGATIVE
Nitrite: NEGATIVE
Protein, ur: NEGATIVE mg/dL
Specific Gravity, Urine: >1.046 — ABNORMAL HIGH (ref 1.005–1.030)
pH: 6 (ref 5.0–8.0)

## 2022-05-10 LAB — LIPASE, BLOOD: Lipase: 29 U/L (ref 11–51)

## 2022-05-10 MED ORDER — HYDROMORPHONE HCL 1 MG/ML IJ SOLN
0.5000 mg | Freq: Once | INTRAMUSCULAR | Status: AC
  Administered 2022-05-10: 0.5 mg via INTRAVENOUS
  Filled 2022-05-10: qty 0.5

## 2022-05-10 MED ORDER — IOHEXOL 350 MG/ML SOLN
100.0000 mL | Freq: Once | INTRAVENOUS | Status: AC | PRN
  Administered 2022-05-10: 100 mL via INTRAVENOUS

## 2022-05-10 MED ORDER — PANTOPRAZOLE SODIUM 40 MG IV SOLR
40.0000 mg | Freq: Once | INTRAVENOUS | Status: AC
  Administered 2022-05-10: 40 mg via INTRAVENOUS
  Filled 2022-05-10: qty 10

## 2022-05-10 MED ORDER — ONDANSETRON HCL 4 MG/2ML IJ SOLN
4.0000 mg | Freq: Once | INTRAMUSCULAR | Status: AC
  Administered 2022-05-10: 4 mg via INTRAVENOUS
  Filled 2022-05-10: qty 2

## 2022-05-10 MED ORDER — ONDANSETRON HCL 4 MG/2ML IJ SOLN
4.0000 mg | Freq: Once | INTRAMUSCULAR | Status: AC
Start: 2022-05-10 — End: 2022-05-10
  Administered 2022-05-10: 4 mg via INTRAVENOUS
  Filled 2022-05-10: qty 2

## 2022-05-10 MED ORDER — SODIUM CHLORIDE 0.9 % IV BOLUS
500.0000 mL | Freq: Once | INTRAVENOUS | Status: AC
  Administered 2022-05-10: 500 mL via INTRAVENOUS
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
## 2022-05-10 NOTE — ED Triage Notes (Addendum)
Pt comes in with complaints of n/v all day. Pt is having upper abdominal pain above the umbilicus. Patient states sharp shooting with palpated. Patient states he took "nausea pill" at home around 1200 with no relief.

## 2022-05-10 NOTE — ED Provider Notes (Signed)
[place] EMERGENCY DEPARTMENT AT [facility] Provider Note   CSN: [id number] Arrival date & time: 05/10/22  1708     History {Add pertinent medical, surgical, social history, OB history to HPI:1} Chief Complaint  Patient presents with   Abdominal Pain    [name] is a 72 y.o. male.  Patient with abdominal pain and vomiting.  He has a history of coronary artery disease.  Patient states he vomited 14 times and the pain has gone into his back  The history is provided by the patient and medical records. No language interpreter was used.  Abdominal Pain Pain location:  Epigastric Pain quality: aching   Pain radiates to:  Does not radiate Pain severity:  No pain Onset quality:  Sudden Timing:  Constant Progression:  Waxing and waning Associated symptoms: no chest pain, no cough, no diarrhea, no fatigue and no hematuria        Home Medications Prior to Admission medications   Medication Sig Start Date End Date Taking? Authorizing Provider  aspirin EC 81 MG tablet Take 81 mg by mouth daily.   Yes [provider]  HYDROcodone-acetaminophen (NORCO/VICODIN) 5-325 MG tablet One tablet every six hours for pain.  Limit 7 days. 04/20/22  Yes Darreld Mclean, MD  linagliptin (TRADJENTA) 5 MG TABS tablet Take 5 mg by mouth daily.   Yes [provider]  lisinopril (ZESTRIL) 5 MG tablet Take 5 mg by mouth daily.   Yes [provider]  meloxicam (MOBIC) 15 MG tablet Take one tablet daily. 09/28/21  Yes Darreld Mclean, MD  Menthol, Topical Analgesic, (ICY HOT EX) Apply 1 application. topically daily as needed (pain).   Yes [provider]  metFORMIN (GLUCOPHAGE) 500 MG tablet Take 500 mg by mouth 2 (two) times daily. 03/31/20  Yes [provider]  metoprolol tartrate (LOPRESSOR) 25 MG tablet Take 0.5 tablets (12.5 mg total) by mouth 2 (two) times daily. 11/13/17  Yes Lonia Blood, MD  nitroGLYCERIN (NITROSTAT) 0.4 MG SL tablet  Place 1 tablet (0.4 mg total) under the tongue every 5 (five) minutes as needed for chest pain. 02/24/22  Yes Mallipeddi, Vishnu P, MD  ondansetron (ZOFRAN) 4 MG tablet Take 1 tablet (4 mg total) by mouth every 6 (six) hours as needed for nausea. 04/06/21  Yes Rodolph Bong, MD  rosuvastatin (CRESTOR) 20 MG tablet Take 1 tablet (20 mg total) by mouth daily. 02/24/22  Yes Mallipeddi, Vishnu P, MD  tamsulosin (FLOMAX) 0.4 MG CAPS capsule Take 0.4 mg by mouth in the morning and at bedtime.   Yes [provider]  TRESIBA FLEXTOUCH 100 UNIT/ML FlexTouch Pen Inject 15 Units into the skin every morning. Patient taking differently: Inject 20 Units into the skin every morning. 04/06/21  Yes Rodolph Bong, MD      Allergies    Patient has no known allergies.    Review of Systems   Review of Systems  Constitutional:  Negative for appetite change and fatigue.  HENT:  Negative for congestion, ear discharge and sinus pressure.   Eyes:  Negative for discharge.  Respiratory:  Negative for cough.   Cardiovascular:  Negative for chest pain.  Gastrointestinal:  Positive for abdominal pain. Negative for diarrhea.  Genitourinary:  Negative for frequency and hematuria.  Musculoskeletal:  Negative for back pain.  Skin:  Negative for rash.  Neurological:  Negative for seizures and headaches.  Psychiatric/Behavioral:  Negative for hallucinations.     Physical Exam Updated Vital  Signs BP 121/73   Pulse 62   Temp 98.1 F (36.7 C) (Oral)   Resp 18   Ht  (1.778 m)   Wt 66.7 kg   SpO2 95%   BMI 21.09 kg/m  Physical Exam Vitals and nursing note reviewed.  Constitutional:      Appearance: He is well-developed.  HENT:     Head: Normocephalic.     Mouth/Throat:     Mouth: Mucous membranes are moist.  Eyes:     General: No scleral icterus.    Conjunctiva/sclera: Conjunctivae normal.  Neck:     Thyroid: No thyromegaly.  Cardiovascular:     Rate and Rhythm: Normal rate and regular  rhythm.     Heart sounds: No murmur heard.    No friction rub. No gallop.  Pulmonary:     Breath sounds: No stridor. No wheezing or rales.  Chest:     Chest wall: No tenderness.  Abdominal:     General: There is no distension.     Tenderness: There is abdominal tenderness. There is no rebound.  Musculoskeletal:        General: Normal range of motion.     Cervical back: Neck supple.  Lymphadenopathy:     Cervical: No cervical adenopathy.  Skin:    Findings: No erythema or rash.  Neurological:     Mental Status: He is alert and oriented to person, place, and time.     Motor: No abnormal muscle tone.     Coordination: Coordination normal.  Psychiatric:        Behavior: Behavior normal.     ED Results / Procedures / Treatments   Labs (all labs ordered are listed, but only abnormal results are displayed) Labs Reviewed  COMPREHENSIVE METABOLIC PANEL - Abnormal; Notable for the following components:      Result Value   Glucose, Bld 206 (*)    All other components within normal limits  CBC - Abnormal; Notable for the following components:   WBC 13.0 (*)    All other components within normal limits  URINALYSIS, ROUTINE W REFLEX MICROSCOPIC - Abnormal; Notable for the following components:   Specific Gravity, Urine >1.046 (*)    Glucose, UA >=500 (*)    Ketones, ur 80 (*)    Bacteria, UA RARE (*)    All other components within normal limits  LIPASE, BLOOD  TROPONIN I (HIGH SENSITIVITY)  TROPONIN I (HIGH SENSITIVITY)  TROPONIN I (HIGH SENSITIVITY)    EKG None  Radiology CT Angio Chest/Abd/Pel for Dissection W and/or Wo Contrast  Result Date: 05/10/2022 CLINICAL DATA:  Acute aortic syndrome suspected. Nausea and vomiting, upper abdominal pain. EXAM: CT ANGIOGRAPHY CHEST, ABDOMEN AND PELVIS TECHNIQUE: Non-contrast CT of the chest was initially obtained. Multidetector CT imaging through the chest, abdomen and pelvis was performed using the standard protocol during bolus  administration of intravenous contrast. Multiplanar reconstructed images and MIPs were obtained and reviewed to evaluate the vascular anatomy. RADIATION DOSE REDUCTION: This exam was performed according to the departmental dose-optimization program which includes automated exposure control, adjustment of the mA and/or kV according to patient size and/or use of iterative reconstruction technique. CONTRAST:  OMNIPAQUE IOHEXOL 350 MG/ML SOLN COMPARISON:  11/14/2021. FINDINGS: CTA CHEST FINDINGS Cardiovascular: Heart is normal in size and there is no pericardial effusion. Multi-vessel coronary artery calcifications are noted. 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 Calcification of the mitral valve annulus is noted. There is atherosclerotic calcification of the aorta without evidence of  aneurysm or dissection. The pulmonary trunk is distended suggesting underlying pulmonary artery hypertension. Mediastinum/Nodes: No mediastinal, hilar, or axillary lymphadenopathy. The thyroid gland, trachea, and esophagus are within normal limits. There is a small hiatal hernia. Lungs/Pleura: Paraseptal and centrilobular emphysematous changes are present in the lungs. Mild interlobular septal thickening is noted bilaterally. No effusion or pneumothorax. There is a 2 cm nodular opacity in the left upper lobe, axial image 55. Musculoskeletal: Sternotomy wires are noted. No acute osseous abnormality. Review of the MIP images confirms the above findings. CTA ABDOMEN AND PELVIS FINDINGS VASCULAR Aorta: Aortic atherosclerosis with distal abdominal aortic aneurysm measuring 4.0 x 3.6 cm, decreased in size from the prior exam. An aortic endograft is in place in the infrarenal abdominal aorta with common iliac artery stents bilaterally. No dissection is seen. Celiac: Patent. There is mild to moderate stenosis of the proximal celiac artery with poststenotic dilatation. No dissection or vasculitis. SMA: Patent without evidence of aneurysm,  dissection, vasculitis or significant stenosis. Renals: Both renal arteries are patent without evidence of aneurysm, dissection, vasculitis, fibromuscular dysplasia or significant stenosis. IMA: Patent. 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 The common iliac artery and external iliac artery arteries and visualized femoral arteries on the right are patent without evidence of dissection or aneurysm. There is occlusion of the proximal internal iliac artery on the right with reconstitution distally. Veins: No obvious venous abnormality within the limitations of this arterial phase study. Review of the MIP images confirms the above findings. NON-VASCULAR Hepatobiliary: No focal liver abnormality is seen. Fatty infiltration of the liver is noted. No gallstones, gallbladder wall thickening, or biliary dilatation. Pancreas: Unremarkable. No pancreatic ductal dilatation or surrounding inflammatory changes. Spleen: Normal in size without focal abnormality. Adrenals/Urinary Tract: There is thickening of the adrenal glands bilaterally with a stable 1.4 cm adrenal nodule on the right. The kidneys enhance symmetrically. No renal calculus or hydronephrosis. Subcentimeter hypodensities are present in the kidneys bilaterally which are too small to further characterize. The bladder is unremarkable. Stomach/Bowel: There is a small hiatal hernia. Stomach is within normal limits. Appendix appears normal. No evidence of bowel wall thickening, distention, or inflammatory changes. No free air or pneumatosis. Few scattered diverticula are present along the colon without evidence of diverticulitis. Lymphatic: No abdominal or pelvic lymphadenopathy. Reproductive: Prostate is unremarkable. Other: No abdominopelvic ascites. Fat containing inguinal hernia is noted on the right. Musculoskeletal: Degenerative changes are present in the  lumbar spine. No acute osseous abnormality Review of the MIP images confirms the above findings. IMPRESSION: 1. Aortic atherosclerosis without evidence of dissection. 2. Aneurysmal dilatation of the distal abdominal aorta measuring 4.0 cm with endograft stent common decreased in size from the previous exam. 3. 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 4. 2 cm left upper lobe pulmonary nodule. Consider one of the following in 3 months for both low-risk and high-risk individuals: (a) repeat chest CT, (b) follow-up PET-CT, or (c) tissue sampling. This recommendation follows the consensus statement: Guidelines for Management of Incidental Pulmonary Nodules Detected on CT Images: From the Fleischner Society 2017; Radiology 2017; 284:228-243. 5. Emphysema and fibrotic changes in the lungs. 6. Multi-vessel coronary artery calcifications. 7. Small hiatal hernia. 8. Remaining incidental findings as described above. Electronically Signed   By: Thornell Sartorius M.D.   On: 05/10/2022  22:07   DG Chest Port 1 View  Result Date: 05/10/2022 CLINICAL DATA:  Upper abdominal pain. EXAM: PORTABLE CHEST 1 VIEW COMPARISON:  February 07, 2021 FINDINGS: Multiple sternal wires are noted. The heart size and mediastinal contours are within normal limits. An artificial aortic valve is noted. Diffuse, chronic appearing mild to moderate severity increased interstitial lung markings are seen. Mild scarring and/or atelectasis is present within the bilateral lung bases. A 7 mm nodular opacity is seen overlying the lateral aspect of the right lung base. There is no evidence of a pleural effusion or pneumothorax. The visualized skeletal structures are unremarkable. IMPRESSION: 1. Evidence of prior median sternotomy/CABG. 2. Chronic interstitial lung disease without superimposed acute cardiopulmonary abnormality. 3. Findings which may represent  a 7 mm right lower lobe lung nodule. Correlation with nonemergent chest CT is recommended. Electronically Signed   By: Aram Candela M.D.   On: 05/10/2022 20:41    Procedures Procedures  {Document cardiac monitor, telemetry assessment procedure when appropriate:1}  Medications Ordered in ED Medications  HYDROmorphone (DILAUDID) injection 0.5 mg (0.5 mg Intravenous Given 05/10/22 2010)  ondansetron (ZOFRAN) injection 4 mg (4 mg Intravenous Given 05/10/22 2010)  pantoprazole (PROTONIX) injection 40 mg (40 mg Intravenous Given 05/10/22 2010)  iohexol (OMNIPAQUE) 350 MG/ML injection 100 mL (100 mLs Intravenous Contrast Given 05/10/22 2122)  sodium chloride 0.9 % bolus 500 mL (500 mLs Intravenous New Bag/Given 05/10/22 2307)  ondansetron (ZOFRAN) injection 4 mg (4 mg Intravenous Given 05/10/22 2307)    ED Course/ Medical Decision Making/ A&P   {Patient with epigastric pain with a history of coronary artery disease.  CT scan shows severe hiatal hernia. Click here for ABCD2, HEART and other calculatorsREFRESH Note before signing :1}                          Medical Decision Making Amount and/or Complexity of Data Reviewed Labs: ordered. Radiology: ordered. ECG/medicine tests: ordered.  Risk Prescription drug management. Decision regarding hospitalization.   Patient will be admitted for persistent vomiting and epigastric pain and dehydration and hiatal hernia  {Document critical care time when appropriate:1} {Document review of labs and clinical decision tools ie heart score, Chads2Vasc2 etc:1}  {Document your independent review of radiology images, and any outside records:1} {Document your discussion with family members, caretakers, and with consultants:1} {Document social determinants of health affecting pt's care:1} {Document your decision making why or why not admission, treatments were needed:1} Final Clinical Impression(s) / ED Diagnoses Final diagnoses:  Pain of upper abdomen     Rx / DC Orders ED Discharge Orders     None

## 2022-05-11 ENCOUNTER — Observation Stay (HOSPITAL_COMMUNITY): Payer: Medicare HMO

## 2022-05-11 DIAGNOSIS — R1084 Generalized abdominal pain: Secondary | ICD-10-CM | POA: Diagnosis not present

## 2022-05-11 DIAGNOSIS — E1165 Type 2 diabetes mellitus with hyperglycemia: Secondary | ICD-10-CM | POA: Insufficient documentation

## 2022-05-11 DIAGNOSIS — N400 Enlarged prostate without lower urinary tract symptoms: Secondary | ICD-10-CM | POA: Insufficient documentation

## 2022-05-11 DIAGNOSIS — R1010 Upper abdominal pain, unspecified: Secondary | ICD-10-CM | POA: Diagnosis not present

## 2022-05-11 DIAGNOSIS — I10 Essential (primary) hypertension: Secondary | ICD-10-CM | POA: Insufficient documentation

## 2022-05-11 LAB — COMPREHENSIVE METABOLIC PANEL
ALT: 11 U/L (ref 0–44)
AST: 12 U/L — ABNORMAL LOW (ref 15–41)
Albumin: 3.4 g/dL — ABNORMAL LOW (ref 3.5–5.0)
Alkaline Phosphatase: 58 U/L (ref 38–126)
Anion gap: 8 (ref 5–15)
BUN: 12 mg/dL (ref 8–23)
CO2: 24 mmol/L (ref 22–32)
Calcium: 8.6 mg/dL — ABNORMAL LOW (ref 8.9–10.3)
Chloride: 104 mmol/L (ref 98–111)
Creatinine, Ser: 0.69 mg/dL (ref 0.61–1.24)
GFR, Estimated: >60 mL/min (ref >60)
Glucose, Bld: 118 mg/dL — ABNORMAL HIGH (ref 70–99)
Potassium: 4 mmol/L (ref 3.5–5.1)
Sodium: 136 mmol/L (ref 135–145)
Total Bilirubin: 0.7 mg/dL (ref 0.3–1.2)
Total Protein: 6.5 g/dL (ref 6.5–8.1)

## 2022-05-11 LAB — GLUCOSE, CAPILLARY
Glucose-Capillary: 141 mg/dL — ABNORMAL HIGH (ref 70–99)
Glucose-Capillary: 167 mg/dL — ABNORMAL HIGH (ref 70–99)
Glucose-Capillary: 163 mg/dL — ABNORMAL HIGH (ref 70–99)
Glucose-Capillary: 119 mg/dL — ABNORMAL HIGH (ref 70–99)

## 2022-05-11 LAB — CBC
HCT: 39.7 % (ref 39.0–52.0)
Hemoglobin: 13 g/dL (ref 13.0–17.0)
MCH: 28.8 pg (ref 26.0–34.0)
MCHC: 32.7 g/dL (ref 30.0–36.0)
MCV: 88 fL (ref 80.0–100.0)
Platelets: 180 10*3/uL (ref 150–400)
RBC: 4.51 MIL/uL (ref 4.22–5.81)
RDW: 14.6 % (ref 11.5–15.5)
WBC: 11.8 10*3/uL — ABNORMAL HIGH (ref 4.0–10.5)
nRBC: 0 % (ref 0.0–0.2)

## 2022-05-11 LAB — TROPONIN I (HIGH SENSITIVITY): Troponin I (High Sensitivity): 11 ng/L (ref <18)

## 2022-05-11 LAB — MAGNESIUM: Magnesium: 1.7 mg/dL (ref 1.7–2.4)

## 2022-05-11 LAB — PHOSPHORUS: Phosphorus: 3.5 mg/dL (ref 2.5–4.6)

## 2022-05-11 MED ORDER — SODIUM CHLORIDE 0.9 % IV SOLN: INTRAVENOUS | Status: AC

## 2022-05-11 MED ORDER — ACETAMINOPHEN 325 MG PO TABS: 650.0000 mg | ORAL_TABLET | Freq: Four times a day (QID) | ORAL | Status: DC | PRN

## 2022-05-11 MED ORDER — ONDANSETRON HCL 4 MG PO TABS: 4.0000 mg | ORAL_TABLET | Freq: Four times a day (QID) | ORAL | Status: DC | PRN

## 2022-05-11 MED ORDER — ACETAMINOPHEN 650 MG RE SUPP: 650.0000 mg | Freq: Four times a day (QID) | RECTAL | Status: DC | PRN

## 2022-05-11 MED ORDER — METOCLOPRAMIDE HCL 5 MG/ML IJ SOLN
10.0000 mg | Freq: Three times a day (TID) | INTRAMUSCULAR | Status: DC
  Administered 2022-05-11 – 2022-05-12 (×3): 10 mg via INTRAVENOUS
  Filled 2022-05-11 (×3): qty 2

## 2022-05-11 MED ORDER — TAMSULOSIN HCL 0.4 MG PO CAPS: 0.4000 mg | ORAL_CAPSULE | Freq: Every day | ORAL | Status: DC

## 2022-05-11 MED ORDER — METOPROLOL TARTRATE 25 MG PO TABS
12.5000 mg | ORAL_TABLET | Freq: Two times a day (BID) | ORAL | Status: DC
  Administered 2022-05-11 – 2022-05-12 (×3): 12.5 mg via ORAL
  Filled 2022-05-11 (×4): qty 1

## 2022-05-11 MED ORDER — INSULIN ASPART 100 UNIT/ML IJ SOLN
0.0000 [IU] | Freq: Three times a day (TID) | INTRAMUSCULAR | Status: DC
  Administered 2022-05-11 – 2022-05-12 (×4): 1 [IU] via SUBCUTANEOUS

## 2022-05-11 MED ORDER — ONDANSETRON HCL 4 MG/2ML IJ SOLN
4.0000 mg | Freq: Four times a day (QID) | INTRAMUSCULAR | Status: DC | PRN
  Filled 2022-05-11: qty 2

## 2022-05-11 MED ORDER — ROSUVASTATIN CALCIUM 20 MG PO TABS
20.0000 mg | ORAL_TABLET | Freq: Every day | ORAL | Status: DC
  Administered 2022-05-11 – 2022-05-12 (×2): 20 mg via ORAL
  Filled 2022-05-11 (×2): qty 1

## 2022-05-11 MED ORDER — ASPIRIN 81 MG PO TBEC
81.0000 mg | DELAYED_RELEASE_TABLET | Freq: Every day | ORAL | Status: DC
  Administered 2022-05-11 – 2022-05-12 (×2): 81 mg via ORAL
  Filled 2022-05-11 (×2): qty 1

## 2022-05-11 MED ORDER — ENOXAPARIN SODIUM 40 MG/0.4ML IJ SOSY: 40.0000 mg | PREFILLED_SYRINGE | INTRAMUSCULAR | Status: DC

## 2022-05-11 MED ORDER — PNEUMOCOCCAL 20-VAL CONJ VACC 0.5 ML IM SUSY
0.5000 mL | PREFILLED_SYRINGE | INTRAMUSCULAR | Status: AC
  Administered 2022-05-12: 0.5 mL via INTRAMUSCULAR
  Filled 2022-05-11: qty 0.5

## 2022-05-11 MED ORDER — HYDROMORPHONE HCL 1 MG/ML IJ SOLN: 0.5000 mg | INTRAMUSCULAR | Status: DC | PRN

## 2022-05-11 MED ORDER — HYDRALAZINE HCL 20 MG/ML IJ SOLN
10.0000 mg | INTRAMUSCULAR | Status: DC | PRN
  Administered 2022-05-11 – 2022-05-12 (×2): 10 mg via INTRAVENOUS
  Filled 2022-05-11 (×2): qty 1

## 2022-05-11 MED ORDER — INSULIN ASPART 100 UNIT/ML IJ SOLN: 0.0000 [IU] | Freq: Three times a day (TID) | INTRAMUSCULAR | Status: DC

## 2022-05-11 MED ORDER — LISINOPRIL 10 MG PO TABS
5.0000 mg | ORAL_TABLET | Freq: Every day | ORAL | Status: DC
  Administered 2022-05-11 – 2022-05-12 (×2): 5 mg via ORAL
  Filled 2022-05-11 (×2): qty 1

## 2022-05-11 NOTE — Progress Notes (Signed)
Patient was scheduled for discharge this am but it was cancelled due to patient reoccurrence of nausea and vomiting moderate amounts of liquids consumed when diet advanced. Physician contacted and orders received for increased fluids and KUB of abdomen, antiemetic given and NPO status reordered until further notice.

## 2022-05-11 NOTE — Care Management CC44 (Signed)
Condition Code 44 Documentation Completed  Patient Details  Name: [name] MRN: [id number] Date of Birth: Nov 16, 1950   Condition Code 44 given:  Yes Patient signature on Condition Code 44 notice:  Yes Documentation of 2 MD's agreement:  Yes Code 44 added to claim:  Yes    Villa Herb, LCSWA 05/11/2022, 11:04 AM

## 2022-05-11 NOTE — Care Management Obs Status (Signed)
MEDICARE OBSERVATION STATUS NOTIFICATION   Patient Details  Name: [name] MRN: [id number] Date of Birth: 09/02/1950   Medicare Observation Status Notification Given:  Yes    Villa Herb, LCSWA 05/11/2022, 11:04 AM

## 2022-05-11 NOTE — Discharge Summary (Addendum)
Physician Discharge Summary   Patient: [name] MRN: [id number] DOB: 1950/02/11  Admit date:     05/10/2022  Discharge date: 05/11/22  Discharge Physician: Kendell Bane   PCP: Carylon Perches, MD   Patient was initially discharged but unable to tolerate p.o., has had projectile vomiting, and hypertensive state  Will keep the patient overnight, IV fluids, antiemetics  -------------------------------------------------------------------------------------------------------  Recommendations at discharge:   Follow-up with PCP within 1-2 week CT finding was discussed with the patient in detail he is to follow-up with PCP and cardiology regarding distal aortic aneurysm. Follow-up with as outpatient with pulmonology with lung nodules  Discharge Diagnoses: Principal Problem:   Abdominal pain Active Problems:   Mixed hyperlipidemia   Nausea & vomiting   Essential hypertension   Type 2 diabetes mellitus with hyperglycemia   BPH (benign prostatic hyperplasia)  Resolved Problems:   * No resolved hospital problems. [name] Course: TAHMID [name] is a 72 y.o. male with medical history significant of hyperlipidemia, CAD, AAA, T2DM, left AKA who presents to the emergency department due to nausea and vomiting which started today, patient states that he has had 12-14 episodes of nonbloody vomitus today and he complains of upper abdominal pain which was sharp and radiates to the chest and the back.  There was no relief despite taking medication at home for nausea.  He denies fever, chills, shortness of breath, sick contact.   ED Course: Temp. 98.1 F, RR 16/min, HR 48 bpm, BP 174/73, O2 sat was 100% on room air.   WBC of 13.0.  BMP was normal except for blood glucose of 206.  Urinalysis was positive for glycosuria, troponin x 2 was negative, lipase 29. CT angiography chest, abdomen and pelvis showed 1. Aortic atherosclerosis without evidence of dissection. 2. Aneurysmal dilatation of  the distal abdominal aorta measuring 4.0 cm with endograft stent common decreased in size from the previous exam. 3. 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 4. 2 cm left upper lobe pulmonary nodule. Consider one of the following in 3 months for both low-risk and high-risk individuals: (a) repeat chest CT, (b) follow-up PET-CT, or (c) tissue sampling. 5. Emphysema and fibrotic changes in the lungs. 6. Multi-vessel coronary artery calcifications. 7. Small hiatal hernia. Chest x ray 1. Evidence of prior median sternotomy/CABG. 2. Chronic interstitial lung disease without superimposed acute cardiopulmonary abnormality. 3. Findings which may represent a 7 mm right lower lobe lung nodule. Correlation with nonemergent chest CT is recommended. Patient was treated with Zofran and Dilaudid, IV hydration was provided.  Hospitalist was asked to admit patient for further evaluation and management.  Present on Admission:  Abdominal pain  Nausea & vomiting  Mixed hyperlipidemia  Active Problems:   Mixed hyperlipidemia   Essential hypertension   Type 2 diabetes mellitus with hyperglycemia   BPH (benign prostatic hyperplasia)   Abdominal pain, nausea and vomiting Continue IV NS at 75 mLs/Hr Continue IV Dilaudid 0.5 mg q.4h p.r.n. for moderate to severe pain Continue IV Zofran p.r.n. Was tolerating p.o., was advanced accordingly   Mixed hyperlipidemia Continue Lipitor   CAD Continue aspirin, Lopressor, Lipitor   T2DM Continue ISS and hypoglycemia protocol -Resuming home regimen   Essential hypertension Continue lisinopril, blood pressure   BPH Continue Flomax  CT finding was discussed with the patient in detail he is to follow-up as an outpatient closely with PCP and  cardiology regarding distal aortic aneurysm. Follow-up with as outpatient with pulmonology with lung  nodules    Disposition: Home Diet recommendation:  Discharge Diet Orders (From admission, onward)     Start     Ordered   05/11/22 0000  Diet - low sodium heart healthy        05/11/22 1031           Carb modified diet DISCHARGE MEDICATION: Allergies as of 05/11/2022   No Known Allergies      Medication List     TAKE these medications    aspirin EC 81 MG tablet Take 81 mg by mouth daily.   HYDROcodone-acetaminophen 5-325 MG tablet Commonly known as: NORCO/VICODIN One tablet every six hours for pain.  Limit 7 days.   ICY HOT EX Apply 1 application. topically daily as needed (pain).   linagliptin 5 MG Tabs tablet Commonly known as: TRADJENTA Take 5 mg by mouth daily.   lisinopril 5 MG tablet Commonly known as: ZESTRIL Take 5 mg by mouth daily.   meloxicam 15 MG tablet Commonly known as: Mobic Take one tablet daily.   metFORMIN 500 MG tablet Commonly known as: GLUCOPHAGE Take 500 mg by mouth 2 (two) times daily.   metoprolol tartrate 25 MG tablet Commonly known as: LOPRESSOR Take 0.5 tablets (12.5 mg total) by mouth 2 (two) times daily.   nitroGLYCERIN 0.4 MG SL tablet Commonly known as: NITROSTAT Place 1 tablet (0.4 mg total) under the tongue every 5 (five) minutes as needed for chest pain.   ondansetron 4 MG tablet Commonly known as: ZOFRAN Take 1 tablet (4 mg total) by mouth every 6 (six) hours as needed for nausea.   rosuvastatin 20 MG tablet Commonly known as: Crestor Take 1 tablet (20 mg total) by mouth daily.   tamsulosin 0.4 MG Caps capsule Commonly known as: FLOMAX Take 0.4 mg by mouth in the morning and at bedtime.   [name] FlexTouch 100 UNIT/ML FlexTouch Pen Generic drug: insulin degludec Inject 15 Units into the skin every morning. What changed: how much to take        Discharge Exam: Filed Weights   05/10/22 1838  Weight: 66.7 kg        General:  AAO x 3,  cooperative, no distress;   HEENT:  Normocephalic, PERRL,  otherwise with in Normal limits   Neuro:  CNII-XII intact. , normal motor and sensation, reflexes intact   Lungs:   Clear to auscultation BL, Respirations unlabored,  No wheezes / crackles  Cardio:    S1/S2, RRR, No murmure, No Rubs or Gallops   Abdomen:  Soft, non-tender, bowel sounds active all four quadrants, no guarding or peritoneal signs.  Muscular  skeletal:  Limited exam -global generalized weaknesses - in bed, able to move all 4 extremities,   2+ pulses,  symmetric, No pitting edema  Skin:  Dry, warm to touch, negative for any Rashes,  Wounds: Please see nursing documentation          Condition at discharge: fair  The results of significant diagnostics from this hospitalization (including imaging, microbiology, ancillary and laboratory) are listed below for reference.   Imaging Studies: CT Angio Chest/Abd/Pel for Dissection W and/or Wo Contrast  Result Date: 05/10/2022 CLINICAL DATA:  Acute aortic syndrome suspected. Nausea and vomiting, upper abdominal pain. EXAM: CT ANGIOGRAPHY CHEST, ABDOMEN AND PELVIS TECHNIQUE: Non-contrast CT of the chest was initially obtained. Multidetector CT imaging through the chest, abdomen and pelvis was performed using the standard  protocol during bolus administration of intravenous contrast. Multiplanar reconstructed images and MIPs were obtained and reviewed to evaluate the vascular anatomy. RADIATION DOSE REDUCTION: This exam was performed according to the departmental dose-optimization program which includes automated exposure control, adjustment of the mA and/or kV according to patient size and/or use of iterative reconstruction technique. CONTRAST:  OMNIPAQUE IOHEXOL 350 MG/ML SOLN COMPARISON:  11/14/2021. FINDINGS: CTA CHEST FINDINGS Cardiovascular: Heart is normal in size and there is no pericardial effusion. Multi-vessel coronary artery calcifications are noted. 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 Calcification of the mitral valve  annulus is noted. There is atherosclerotic calcification of the aorta without evidence of aneurysm or dissection. The pulmonary trunk is distended suggesting underlying pulmonary artery hypertension. Mediastinum/Nodes: No mediastinal, hilar, or axillary lymphadenopathy. The thyroid gland, trachea, and esophagus are within normal limits. There is a small hiatal hernia. Lungs/Pleura: Paraseptal and centrilobular emphysematous changes are present in the lungs. Mild interlobular septal thickening is noted bilaterally. No effusion or pneumothorax. There is a 2 cm nodular opacity in the left upper lobe, axial image 55. Musculoskeletal: Sternotomy wires are noted. No acute osseous abnormality. Review of the MIP images confirms the above findings. CTA ABDOMEN AND PELVIS FINDINGS VASCULAR Aorta: Aortic atherosclerosis with distal abdominal aortic aneurysm measuring 4.0 x 3.6 cm, decreased in size from the prior exam. An aortic endograft is in place in the infrarenal abdominal aorta with common iliac artery stents bilaterally. No dissection is seen. Celiac: Patent. There is mild to moderate stenosis of the proximal celiac artery with poststenotic dilatation. No dissection or vasculitis. SMA: Patent without evidence of aneurysm, dissection, vasculitis or significant stenosis. Renals: Both renal arteries are patent without evidence of aneurysm, dissection, vasculitis, fibromuscular dysplasia or significant stenosis. IMA: Patent. 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 The common iliac artery and external iliac artery arteries and visualized femoral arteries on the right are patent without evidence of dissection or aneurysm. There is occlusion of the proximal internal iliac artery on the right with reconstitution distally. Veins: No obvious venous abnormality within the limitations of this arterial phase study. Review of  the MIP images confirms the above findings. NON-VASCULAR Hepatobiliary: No focal liver abnormality is seen. Fatty infiltration of the liver is noted. No gallstones, gallbladder wall thickening, or biliary dilatation. Pancreas: Unremarkable. No pancreatic ductal dilatation or surrounding inflammatory changes. Spleen: Normal in size without focal abnormality. Adrenals/Urinary Tract: There is thickening of the adrenal glands bilaterally with a stable 1.4 cm adrenal nodule on the right. The kidneys enhance symmetrically. No renal calculus or hydronephrosis. Subcentimeter hypodensities are present in the kidneys bilaterally which are too small to further characterize. The bladder is unremarkable. Stomach/Bowel: There is a small hiatal hernia. Stomach is within normal limits. Appendix appears normal. No evidence of bowel wall thickening, distention, or inflammatory changes. No free air or pneumatosis. Few scattered diverticula are present along the colon without evidence of diverticulitis. Lymphatic: No abdominal or pelvic lymphadenopathy. Reproductive: Prostate is unremarkable. Other: No abdominopelvic ascites. Fat containing inguinal hernia is noted on the right. Musculoskeletal: Degenerative changes are present in the lumbar spine. No acute osseous abnormality Review of the MIP images confirms the above findings. IMPRESSION: 1. Aortic atherosclerosis without evidence of dissection. 2. Aneurysmal dilatation of the distal abdominal aorta measuring 4.0 cm with endograft stent common decreased in size from the previous exam. 3. 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 4. 2 cm left upper lobe pulmonary nodule. Consider one of the following in 3 months for both low-risk and high-risk individuals: (a) repeat chest CT, (b) follow-up PET-CT, or (c) tissue sampling. This recommendation follows the consensus statement:  Guidelines for Management of Incidental Pulmonary Nodules Detected on CT Images: From the Fleischner Society 2017; Radiology 2017; 284:228-243. 5. Emphysema and fibrotic changes in the lungs. 6. Multi-vessel coronary artery calcifications. 7. Small hiatal hernia. 8. Remaining incidental findings as described above. Electronically Signed   By: Thornell Sartorius M.D.   On: 05/10/2022 22:07   DG Chest Port 1 View  Result Date: 05/10/2022 CLINICAL DATA:  Upper abdominal pain. EXAM: PORTABLE CHEST 1 VIEW COMPARISON:  February 07, 2021 FINDINGS: Multiple sternal wires are noted. The heart size and mediastinal contours are within normal limits. An artificial aortic valve is noted. Diffuse, chronic appearing mild to moderate severity increased interstitial lung markings are seen. Mild scarring and/or atelectasis is present within the bilateral lung bases. A 7 mm nodular opacity is seen overlying the lateral aspect of the right lung base. There is no evidence of a pleural effusion or pneumothorax. The visualized skeletal structures are unremarkable. IMPRESSION: 1. Evidence of prior median sternotomy/CABG. 2. Chronic interstitial lung disease without superimposed acute cardiopulmonary abnormality. 3. Findings which may represent a 7 mm right lower lobe lung nodule. Correlation with nonemergent chest CT is recommended. Electronically Signed   By: Aram Candela M.D.   On: 05/10/2022 20:41    Microbiology: Results for orders placed or performed during the hospital encounter of 04/05/21  Resp Panel by RT-PCR (Flu A&B, Covid) Nasopharyngeal Swab     Status: None   Collection Time: 04/06/21  9:12 AM   Specimen: Nasopharyngeal Swab; Nasopharyngeal(NP) swabs in vial transport medium  Result Value Ref Range Status   SARS Coronavirus 2 by RT PCR NEGATIVE NEGATIVE Final    Comment: (NOTE) SARS-CoV-2 target nucleic acids are NOT DETECTED.  The SARS-CoV-2 RNA is generally detectable in upper respiratory specimens during  the acute phase of infection. The lowest concentration of SARS-CoV-2 viral copies this assay can detect is 138 copies/mL. A negative result does not preclude SARS-Cov-2 infection and should not be used as the sole basis for treatment or other patient management decisions. A negative result may occur with  improper specimen collection/handling, submission of specimen other than nasopharyngeal swab, presence of viral mutation(s) within the areas targeted by this assay, and inadequate number of viral copies(<138 copies/mL). A negative result must be combined with clinical observations, patient history, and epidemiological information. The expected result is Negative.  Fact Sheet for Patients:  BloggerCourse.com  Fact Sheet for Healthcare Providers:  SeriousBroker.it  This test is no t yet approved or cleared by the [place] FDA and  has been authorized for detection and/or diagnosis of SARS-CoV-2 by FDA under an Emergency Use Authorization (EUA). This EUA will remain  in effect (meaning this test can be used) for the duration of the COVID-19 declaration under Section 564(b)(1) of the Act, 21 U.S.C.section 360bbb-3(b)(1), unless the authorization is terminated  or revoked sooner.       Influenza A by PCR NEGATIVE NEGATIVE Final   Influenza B by PCR NEGATIVE NEGATIVE Final    Comment: (NOTE) The Xpert Xpress SARS-CoV-2/FLU/RSV plus assay is intended as an aid in the diagnosis of influenza from Nasopharyngeal swab specimens and should not be used as  a sole basis for treatment. Nasal washings and aspirates are unacceptable for Xpert Xpress SARS-CoV-2/FLU/RSV testing.  Fact Sheet for Patients: BloggerCourse.com  Fact Sheet for Healthcare Providers: SeriousBroker.it  This test is not yet approved or cleared by the [place] FDA and has been authorized for detection and/or  diagnosis of SARS-CoV-2 by FDA under an Emergency Use Authorization (EUA). This EUA will remain in effect (meaning this test can be used) for the duration of the COVID-19 declaration under Section 564(b)(1) of the Act, 21 U.S.C. section 360bbb-3(b)(1), unless the authorization is terminated or revoked.  Performed at [facility], [address], [place], [place] [address]     Labs: CBC: Recent Labs  Lab 05/10/22 2005 05/11/22 0408  WBC 13.0* 11.8*  HGB 15.0 13.0  HCT 45.9 39.7  MCV 88.3 88.0  PLT 175 180   Basic Metabolic Panel: Recent Labs  Lab 05/10/22 2005 05/11/22 0408  NA 136 136  K 4.4 4.0  CL 101 104  CO2 22 24  GLUCOSE 206* 118*  BUN 11 12  CREATININE 0.77 0.69  CALCIUM 9.5 8.6*  MG  --  1.7  PHOS  --  3.5   Liver Function Tests: Recent Labs  Lab 05/10/22 2005 05/11/22 0408  AST 24 12*  ALT 17 11  ALKPHOS 69 58  BILITOT 0.7 0.7  PROT 8.1 6.5  ALBUMIN 4.4 3.4*   CBG: Recent Labs  Lab 05/11/22 0749  GLUCAP 141*    Discharge time spent: greater than 30 minutes.  Signed: Kendell Bane, MD [facility] 05/11/2022

## 2022-05-11 NOTE — Progress Notes (Signed)
Mobility Specialist Progress Note:    05/11/22 1152  Mobility  Activity Dangled on edge of bed  Level of Assistance Independent  Assistive Device None  Activity Response Tolerated well  Mobility Referral Yes  $Mobility charge 1 Mobility   Pt agreeable to mobility session. Able to dangle EOB independently. Deferred further mobility d/t nausea. Left pt in bed, all needs met, call bell in reach.   Feliciana Rossetti Mobility Specialist Please contact via Special educational needs teacher or  Rehab office at [phone]

## 2022-05-11 NOTE — Progress Notes (Signed)
  Transition of Care [facility]) Screening Note   Patient Details  Name: [name] Date of Birth: 1950/12/20   Transition of Care [facility]) CM/SW Contact:    Villa Herb, LCSWA Phone Number: 05/11/2022, 11:06 AM    Transition of Care Department [facility]) has reviewed patient and no TOC needs have been identified at this time. We will continue to monitor patient advancement through interdisciplinary progression rounds. If new patient transition needs arise, please place a TOC consult.

## 2022-05-11 NOTE — Hospital Course (Signed)
[name] is a 72 y.o. male with medical history significant of hyperlipidemia, CAD, AAA, T2DM, left AKA who presents to the emergency department due to nausea and vomiting which started today, patient states that he has had 12-14 episodes of nonbloody vomitus today and he complains of upper abdominal pain which was sharp and radiates to the chest and the back.  There was no relief despite taking medication at home for nausea.  He denies fever, chills, shortness of breath, sick contact.   ED Course: Temp. 98.1 F, RR 16/min, HR 48 bpm, BP 174/73, O2 sat was 100% on room air.   WBC of 13.0.  BMP was normal except for blood glucose of 206.  Urinalysis was positive for glycosuria, troponin x 2 was negative, lipase 29. CT angiography chest, abdomen and pelvis showed 1. Aortic atherosclerosis without evidence of dissection. 2. Aneurysmal dilatation of the distal abdominal aorta measuring 4.0 cm with endograft stent common decreased in size from the previous exam. 3. 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 4. 2 cm left upper lobe pulmonary nodule. Consider one of the following in 3 months for both low-risk and high-risk individuals: (a) repeat chest CT, (b) follow-up PET-CT, or (c) tissue sampling. 5. Emphysema and fibrotic changes in the lungs. 6. Multi-vessel coronary artery calcifications. 7. Small hiatal hernia. Chest x ray 1. Evidence of prior median sternotomy/CABG. 2. Chronic interstitial lung disease without superimposed acute cardiopulmonary abnormality. 3. Findings which may represent a 7 mm right lower lobe lung nodule. Correlation with nonemergent chest CT is recommended. Patient was treated with Zofran and Dilaudid, IV hydration was provided.  Hospitalist was asked to admit patient for further evaluation and management.   Assessment/Plan Present on Admission:  Abdominal  pain  Nausea & vomiting  Mixed hyperlipidemia  Active Problems:   Mixed hyperlipidemia   Essential hypertension   Type 2 diabetes mellitus with hyperglycemia   BPH (benign prostatic hyperplasia)   Abdominal pain, nausea and vomiting Continue IV NS at 75 mLs/Hr Continue IV Dilaudid 0.5 mg q.4h p.r.n. for moderate to severe pain Continue IV Zofran p.r.n. Continue clear liquid diet with plan to advance diet as tolerated Consider surgical consult for worsening abdominal pain   Mixed hyperlipidemia Continue Lipitor   CAD Continue aspirin, Lopressor, Lipitor   T2DM Continue ISS and hypoglycemia protocol   Essential hypertension Continue lisinopril, blood pressure   BPH Continue Flomax

## 2022-05-12 DIAGNOSIS — R1084 Generalized abdominal pain: Secondary | ICD-10-CM | POA: Diagnosis not present

## 2022-05-12 DIAGNOSIS — R1010 Upper abdominal pain, unspecified: Secondary | ICD-10-CM | POA: Diagnosis not present

## 2022-05-12 LAB — BASIC METABOLIC PANEL
Anion gap: 12 (ref 5–15)
BUN: 11 mg/dL (ref 8–23)
CO2: 22 mmol/L (ref 22–32)
Calcium: 9.2 mg/dL (ref 8.9–10.3)
Chloride: 95 mmol/L — ABNORMAL LOW (ref 98–111)
Creatinine, Ser: 0.69 mg/dL (ref 0.61–1.24)
GFR, Estimated: >60 mL/min (ref >60)
Glucose, Bld: 158 mg/dL — ABNORMAL HIGH (ref 70–99)
Potassium: 3.6 mmol/L (ref 3.5–5.1)
Sodium: 129 mmol/L — ABNORMAL LOW (ref 135–145)

## 2022-05-12 LAB — HEMOGLOBIN A1C
Hgb A1c MFr Bld: 6.5 % — ABNORMAL HIGH (ref 4.8–5.6)
Mean Plasma Glucose: 140 mg/dL

## 2022-05-12 LAB — GLUCOSE, CAPILLARY
Glucose-Capillary: 156 mg/dL — ABNORMAL HIGH (ref 70–99)
Glucose-Capillary: 163 mg/dL — ABNORMAL HIGH (ref 70–99)

## 2022-05-12 NOTE — Progress Notes (Signed)
PRN hydralazine given for elevated blood pressure 170/81. Patient has tolerated water early this morning without any complaints of n/v. Patient has rested quietly with eyes closed most of the shift. He states he is fine when I ask if he is ok.

## 2022-05-12 NOTE — Discharge Summary (Signed)
Physician Discharge Summary   Patient: [name] MRN: [id number] DOB: Oct 22, 1950  Admit date:     05/10/2022  Discharge date: 05/12/22  Discharge Physician: [name]   PCP: Sean Perches, MD   She was seen and examined today Nausea vomiting has improved, p.o. advancing slowly cleared to discharge home -------------------------------------------------------------------------------------------------------  Recommendations at discharge:   Follow-up with PCP within 1-2 week CT finding was discussed with the patient in detail he is to follow-up with PCP and cardiology regarding distal aortic aneurysm. Follow-up with as outpatient with pulmonology with lung nodules  Discharge Diagnoses: Principal Problem:   Abdominal pain Active Problems:   Mixed hyperlipidemia   Nausea & vomiting   Essential hypertension   Type 2 diabetes mellitus with hyperglycemia   BPH (benign prostatic hyperplasia)  Resolved Problems:   * No resolved hospital problems. [facility] Course: [name] is a 72 y.o. male with medical history significant of hyperlipidemia, CAD, AAA, T2DM, left AKA who presents to the emergency department due to nausea and vomiting which started today, patient states that he has had 12-14 episodes of nonbloody vomitus today and he complains of upper abdominal pain which was sharp and radiates to the chest and the back.  There was no relief despite taking medication at home for nausea.  He denies fever, chills, shortness of breath, sick contact.   ED Course: Temp. 98.1 F, RR 16/min, HR 48 bpm, BP 174/73, O2 sat was 100% on room air.   WBC of 13.0.  BMP was normal except for blood glucose of 206.  Urinalysis was positive for glycosuria, troponin x 2 was negative, lipase 29. CT angiography chest, abdomen and pelvis showed 1. Aortic atherosclerosis without evidence of dissection. 2. 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 3. 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 4. 2 cm left upper lobe pulmonary nodule. Consider one of the following in 3 months for both low-risk and high-risk individuals: (a) repeat chest CT, (b) follow-up PET-CT, or (c) tissue sampling. 5. Emphysema and fibrotic changes in the lungs. 6. Multi-vessel coronary artery calcifications. 7. Small hiatal hernia. Chest x ray 1. Evidence of prior median sternotomy/CABG. 2. Chronic interstitial lung disease without superimposed acute cardiopulmonary abnormality. 3. Findings which may represent a 7 mm right lower lobe lung nodule. Correlation with nonemergent chest CT is recommended. Patient was treated with Zofran and Dilaudid, IV hydration was provided.  Hospitalist was asked to admit patient for further evaluation and management.  Present on Admission:  Abdominal pain  Nausea & vomiting  Mixed hyperlipidemia  Active Problems:   Mixed hyperlipidemia   Essential hypertension   Type 2 diabetes mellitus with hyperglycemia   BPH (benign prostatic hyperplasia)   Abdominal pain, nausea and vomiting Continue IV NS at 75 mLs/Hr Continue IV Dilaudid 0.5 mg q.4h p.r.n. for moderate to severe pain Continue IV Zofran p.r.n. Was tolerating p.o., was advanced accordingly   Mixed hyperlipidemia Continue Lipitor   CAD Continue aspirin, Lopressor, Lipitor   T2DM Continue ISS and hypoglycemia protocol -Resuming home regimen   Essential hypertension Continue lisinopril, blood pressure   BPH Continue Flomax  CT finding was discussed with the patient in detail he is to follow-up as an outpatient closely with PCP and cardiology regarding distal aortic aneurysm. Follow-up with as outpatient  with pulmonology with lung nodules    Disposition: Home Diet recommendation:  Discharge Diet  Orders (From admission, onward)     Start     Ordered   05/11/22 0000  Diet - low sodium heart healthy        05/11/22 1031           Carb modified diet DISCHARGE MEDICATION: Allergies as of 05/12/2022   No Known Allergies      Medication List     TAKE these medications    aspirin EC 81 MG tablet Take 81 mg by mouth daily.   HYDROcodone-acetaminophen 5-325 MG tablet Commonly known as: NORCO/VICODIN One tablet every six hours for pain.  Limit 7 days.   ICY HOT EX Apply 1 application. topically daily as needed (pain).   linagliptin 5 MG Tabs tablet Commonly known as: TRADJENTA Take 5 mg by mouth daily.   lisinopril 5 MG tablet Commonly known as: ZESTRIL Take 5 mg by mouth daily.   meloxicam 15 MG tablet Commonly known as: Mobic Take one tablet daily.   metFORMIN 500 MG tablet Commonly known as: GLUCOPHAGE Take 500 mg by mouth 2 (two) times daily.   metoprolol tartrate 25 MG tablet Commonly known as: LOPRESSOR Take 0.5 tablets (12.5 mg total) by mouth 2 (two) times daily.   nitroGLYCERIN 0.4 MG SL tablet Commonly known as: NITROSTAT Place 1 tablet (0.4 mg total) under the tongue every 5 (five) minutes as needed for chest pain.   ondansetron 4 MG tablet Commonly known as: ZOFRAN Take 1 tablet (4 mg total) by mouth every 6 (six) hours as needed for nausea.   rosuvastatin 20 MG tablet Commonly known as: Crestor Take 1 tablet (20 mg total) by mouth daily.   tamsulosin 0.4 MG Caps capsule Commonly known as: FLOMAX Take 0.4 mg by mouth in the morning and at bedtime.   [name] FlexTouch 100 UNIT/ML FlexTouch Pen Generic drug: insulin degludec Inject 15 Units into the skin every morning. What changed: how much to take        Discharge Exam: Filed Weights   05/10/22 1838  Weight: 66.7 kg         General:  AAO x 3,  cooperative, no distress;   HEENT:  Normocephalic, PERRL, otherwise with in Normal limits   Neuro:  CNII-XII intact. , normal  motor and sensation, reflexes intact   Lungs:   Clear to auscultation BL, Respirations unlabored,  No wheezes / crackles  Cardio:    S1/S2, RRR, No murmure, No Rubs or Gallops   Abdomen:  Soft, non-tender, bowel sounds active all four quadrants, no guarding or peritoneal signs.  Muscular  skeletal:  Limited exam -global generalized weaknesses - in bed, able to move all 4 extremities,   2+ pulses,  symmetric, No pitting edema  Skin:  Dry, warm to touch, negative for any Rashes,  Wounds: Please see nursing documentation           Condition at discharge: fair  The results of significant diagnostics from this hospitalization (including imaging, microbiology, ancillary and laboratory) are listed below for reference.   Imaging Studies: DG Abd 1 View  Result Date: 05/11/2022 CLINICAL DATA:  Intractable vomiting EXAM: ABDOMEN - 1 VIEW COMPARISON:  11/14/2021 FINDINGS: Bowel gas pattern is normal without evidence of ileus or obstruction. No abnormal parenchymal calcifications. Aortoiliac stent graft with atherosclerotic aortic calcifications noted in the region. IMPRESSION: No acute finding. Normal bowel gas pattern. Aortoiliac stent graft. Aortic atherosclerosis. Electronically Signed  By: Paulina Fusi M.D.   On: 05/11/2022 15:36   CT Angio Chest/Abd/Pel for Dissection W and/or Wo Contrast  Result Date: 05/10/2022 CLINICAL DATA:  Acute aortic syndrome suspected. Nausea and vomiting, upper abdominal pain. EXAM: CT ANGIOGRAPHY CHEST, ABDOMEN AND PELVIS TECHNIQUE: Non-contrast CT of the chest was initially obtained. Multidetector CT imaging through the chest, abdomen and pelvis was performed using the standard protocol during bolus administration of intravenous contrast. Multiplanar reconstructed images and MIPs were obtained and reviewed to evaluate the vascular anatomy. RADIATION DOSE REDUCTION: This exam was performed according to the departmental dose-optimization program which includes  automated exposure control, adjustment of the mA and/or kV according to patient size and/or use of iterative reconstruction technique. CONTRAST:  OMNIPAQUE IOHEXOL 350 MG/ML SOLN COMPARISON:  11/14/2021. FINDINGS: CTA CHEST FINDINGS Cardiovascular: Heart is normal in size and there is no pericardial effusion. Multi-vessel coronary artery calcifications are noted. 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 Calcification of the mitral valve annulus is noted. There is atherosclerotic calcification of the aorta without evidence of aneurysm or dissection. The pulmonary trunk is distended suggesting underlying pulmonary artery hypertension. Mediastinum/Nodes: No mediastinal, hilar, or axillary lymphadenopathy. The thyroid gland, trachea, and esophagus are within normal limits. There is a small hiatal hernia. Lungs/Pleura: Paraseptal and centrilobular emphysematous changes are present in the lungs. Mild interlobular septal thickening is noted bilaterally. No effusion or pneumothorax. There is a 2 cm nodular opacity in the left upper lobe, axial image 55. Musculoskeletal: Sternotomy wires are noted. No acute osseous abnormality. Review of the MIP images confirms the above findings. CTA ABDOMEN AND PELVIS FINDINGS VASCULAR Aorta: Aortic atherosclerosis with distal abdominal aortic aneurysm measuring 4.0 x 3.6 cm, decreased in size from the prior exam. An aortic endograft is in place in the infrarenal abdominal aorta with common iliac artery stents bilaterally. No dissection is seen. Celiac: Patent. There is mild to moderate stenosis of the proximal celiac artery with poststenotic dilatation. No dissection or vasculitis. SMA: Patent without evidence of aneurysm, dissection, vasculitis or significant stenosis. Renals: Both renal arteries are patent without evidence of aneurysm, dissection, vasculitis, fibromuscular dysplasia or significant stenosis. IMA: Patent. Inflow: There is a complete occlusion of the common iliac artery  stent, external, and internal iliac arteries on the left and left femoral arteries. The common iliac artery and external iliac artery arteries and visualized femoral arteries on the right are patent without evidence of dissection or aneurysm. There is occlusion of the proximal internal iliac artery on the right with reconstitution distally. Veins: No obvious venous abnormality within the limitations of this arterial phase study. Review of the MIP images confirms the above findings. NON-VASCULAR Hepatobiliary: No focal liver abnormality is seen. Fatty infiltration of the liver is noted. No gallstones, gallbladder wall thickening, or biliary dilatation. Pancreas: Unremarkable. No pancreatic ductal dilatation or surrounding inflammatory changes. Spleen: Normal in size without focal abnormality. Adrenals/Urinary Tract: There is thickening of the adrenal glands bilaterally with a stable 1.4 cm adrenal nodule on the right. The kidneys enhance symmetrically. No renal calculus or hydronephrosis. Subcentimeter hypodensities are present in the kidneys bilaterally which are too small to further characterize. The bladder is unremarkable. Stomach/Bowel: There is a small hiatal hernia. Stomach is within normal limits. Appendix appears normal. No evidence of bowel wall thickening, distention, or inflammatory changes. No free air or pneumatosis. Few scattered diverticula are present along the colon without evidence of diverticulitis. Lymphatic: No abdominal or pelvic lymphadenopathy. Reproductive: Prostate is unremarkable. Other: No abdominopelvic ascites. Fat  containing inguinal hernia is noted on the right. Musculoskeletal: Degenerative changes are present in the lumbar spine. No acute osseous abnormality Review of the MIP images confirms the above findings. IMPRESSION: 1. Aortic atherosclerosis without evidence of dissection. 2. Aneurysmal dilatation of the distal abdominal aorta measuring 4.0 cm with endograft stent common  decreased in size from the previous exam. 3. 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 4. 2 cm left upper lobe pulmonary nodule. Consider one of the following in 3 months for both low-risk and high-risk individuals: (a) repeat chest CT, (b) follow-up PET-CT, or (c) tissue sampling. This recommendation follows the consensus statement: Guidelines for Management of Incidental Pulmonary Nodules Detected on CT Images: From the Fleischner Society 2017; Radiology 2017; 284:228-243. 5. Emphysema and fibrotic changes in the lungs. 6. Multi-vessel coronary artery calcifications. 7. Small hiatal hernia. 8. Remaining incidental findings as described above. Electronically Signed   By: Thornell Sartorius M.D.   On: 05/10/2022 22:07   DG Chest Port 1 View  Result Date: 05/10/2022 CLINICAL DATA:  Upper abdominal pain. EXAM: PORTABLE CHEST 1 VIEW COMPARISON:  February 07, 2021 FINDINGS: Multiple sternal wires are noted. The heart size and mediastinal contours are within normal limits. An artificial aortic valve is noted. Diffuse, chronic appearing mild to moderate severity increased interstitial lung markings are seen. Mild scarring and/or atelectasis is present within the bilateral lung bases. A 7 mm nodular opacity is seen overlying the lateral aspect of the right lung base. There is no evidence of a pleural effusion or pneumothorax. The visualized skeletal structures are unremarkable. IMPRESSION: 1. Evidence of prior median sternotomy/CABG. 2. Chronic interstitial lung disease without superimposed acute cardiopulmonary abnormality. 3. Findings which may represent a 7 mm right lower lobe lung nodule. Correlation with nonemergent chest CT is recommended. Electronically Signed   By: Aram Candela M.D.   On: 05/10/2022 20:41    Microbiology: Results for orders placed or performed during the hospital encounter of 04/05/21   Resp Panel by RT-PCR (Flu A&B, Covid) Nasopharyngeal Swab     Status: None   Collection Time: 04/06/21  9:12 AM   Specimen: Nasopharyngeal Swab; Nasopharyngeal(NP) swabs in vial transport medium  Result Value Ref Range Status   SARS Coronavirus 2 by RT PCR NEGATIVE NEGATIVE Final    Comment: (NOTE) SARS-CoV-2 target nucleic acids are NOT DETECTED.  The SARS-CoV-2 RNA is generally detectable in upper respiratory specimens during the acute phase of infection. The lowest concentration of SARS-CoV-2 viral copies this assay can detect is 138 copies/mL. A negative result does not preclude SARS-Cov-2 infection and should not be used as the sole basis for treatment or other patient management decisions. A negative result may occur with  improper specimen collection/handling, submission of specimen other than nasopharyngeal swab, presence of viral mutation(s) within the areas targeted by this assay, and inadequate number of viral copies(<138 copies/mL). A negative result must be combined with clinical observations, patient history, and epidemiological information. The expected result is Negative.  Fact Sheet for Patients:  BloggerCourse.com  Fact Sheet for Healthcare Providers:  SeriousBroker.it  This test is no t yet approved or cleared by the [place] FDA and  has been authorized for detection and/or diagnosis of SARS-CoV-2 by FDA under an Emergency Use Authorization (EUA). This EUA will remain  in effect (meaning this test can be used) for the duration of the COVID-19 declaration under Section 564(b)(1)  of the Act, 21 U.S.C.section 360bbb-3(b)(1), unless the authorization is terminated  or revoked sooner.       Influenza A by PCR NEGATIVE NEGATIVE Final   Influenza B by PCR NEGATIVE NEGATIVE Final    Comment: (NOTE) The Xpert Xpress SARS-CoV-2/FLU/RSV plus assay is intended as an aid in the diagnosis of influenza from  Nasopharyngeal swab specimens and should not be used as a sole basis for treatment. Nasal washings and aspirates are unacceptable for Xpert Xpress SARS-CoV-2/FLU/RSV testing.  Fact Sheet for Patients: BloggerCourse.com  Fact Sheet for Healthcare Providers: SeriousBroker.it  This test is not yet approved or cleared by the [place] FDA and has been authorized for detection and/or diagnosis of SARS-CoV-2 by FDA under an Emergency Use Authorization (EUA). This EUA will remain in effect (meaning this test can be used) for the duration of the COVID-19 declaration under Section 564(b)(1) of the Act, 21 U.S.C. section 360bbb-3(b)(1), unless the authorization is terminated or revoked.  Performed at [facility], [address], [place], [place] [address]     Labs: CBC: Recent Labs  Lab 05/10/22 2005 05/11/22 0408  WBC 13.0* 11.8*  HGB 15.0 13.0  HCT 45.9 39.7  MCV 88.3 88.0  PLT 175 180   Basic Metabolic Panel: Recent Labs  Lab 05/10/22 2005 05/11/22 0408 05/12/22 0758  NA 136 136 129*  K 4.4 4.0 3.6  CL 101 104 95*  CO2 22 24 22   GLUCOSE 206* 118* 158*  BUN 11 12 11   CREATININE 0.77 0.69 0.69  CALCIUM 9.5 8.6* 9.2  MG  --  1.7  --   PHOS  --  3.5  --    Liver Function Tests: Recent Labs  Lab 05/10/22 2005 05/11/22 0408  AST 24 12*  ALT 17 11  ALKPHOS 69 58  BILITOT 0.7 0.7  PROT 8.1 6.5  ALBUMIN 4.4 3.4*   CBG: Recent Labs  Lab 05/11/22 1132 05/11/22 1657 05/11/22 2128 05/12/22 0732 05/12/22 1143  GLUCAP 167* 163* 119* 156* 163*    Discharge time spent: greater than 30 minutes.  Signed: Kendell Bane, MD [facility] 05/12/2022
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
## 2022-05-23 ENCOUNTER — Institutional Professional Consult (permissible substitution): Payer: Medicare HMO | Admitting: Student

## 2022-06-07 ENCOUNTER — Telehealth: Payer: Self-pay | Admitting: Orthopaedic Surgery

## 2022-06-07 MED ORDER — HYDROCODONE-ACETAMINOPHEN 5-325 MG PO TABS: ORAL_TABLET | ORAL | 0 refills | Status: DC

## 2022-06-07 NOTE — Telephone Encounter (Signed)
Dr. Sanjuan Dame pt - pt lvm requesting a refill on Hydrocodone 5-325 to be sent to [facility] on [address]

## 2022-06-09 ENCOUNTER — Encounter: Payer: Self-pay | Admitting: Student

## 2022-07-05 ENCOUNTER — Encounter: Payer: Self-pay | Admitting: Internal Medicine

## 2022-07-07 ENCOUNTER — Encounter (HOSPITAL_COMMUNITY): Payer: Self-pay | Admitting: Internal Medicine

## 2022-07-07 ENCOUNTER — Other Ambulatory Visit (HOSPITAL_COMMUNITY): Payer: Self-pay | Admitting: Internal Medicine

## 2022-07-07 DIAGNOSIS — R911 Solitary pulmonary nodule: Secondary | ICD-10-CM

## 2022-07-20 ENCOUNTER — Telehealth: Payer: Self-pay

## 2022-07-20 ENCOUNTER — Other Ambulatory Visit: Payer: Self-pay | Admitting: Orthopaedic Surgery

## 2022-07-20 NOTE — Telephone Encounter (Signed)
Called patient he has to be seen for medication management scheduled him for Tuesday at 810

## 2022-07-20 NOTE — Telephone Encounter (Signed)
Dr. Sanjuan Dame pt - pt presented to the office requesting a refill on Hydrocodone 5-325 to be sent to [facility] on [address]

## 2022-07-26 ENCOUNTER — Ambulatory Visit (INDEPENDENT_AMBULATORY_CARE_PROVIDER_SITE_OTHER): Payer: Medicare HMO | Admitting: Orthopaedic Surgery

## 2022-07-26 ENCOUNTER — Encounter: Payer: Self-pay | Admitting: Orthopaedic Surgery

## 2022-07-26 DIAGNOSIS — G8929 Other chronic pain: Secondary | ICD-10-CM

## 2022-07-26 DIAGNOSIS — M25511 Pain in right shoulder: Secondary | ICD-10-CM

## 2022-07-26 MED ORDER — HYDROCODONE-ACETAMINOPHEN 5-325 MG PO TABS: ORAL_TABLET | ORAL | 0 refills | Status: DC

## 2022-07-26 MED ORDER — METHYLPREDNISOLONE ACETATE 40 MG/ML IJ SUSP
40.0000 mg | Freq: Once | INTRAMUSCULAR | Status: AC
  Administered 2022-07-26: 40 mg via INTRA_ARTICULAR

## 2022-07-26 NOTE — Progress Notes (Signed)
PROCEDURE NOTE:  The patient request injection, verbal consent was obtained.  The right shoulder was prepped appropriately after time out was performed.   Sterile technique was observed and injection of 1 cc of DepoMedrol 40mg  with several cc's of plain xylocaine. Anesthesia was provided by ethyl chloride and a 20-gauge needle was used to inject the shoulder area. A posterior approach was used.  The injection was tolerated well.  A band aid dressing was applied.  The patient was advised to apply ice later today and tomorrow to the injection sight as needed.  Encounter Diagnosis  Name Primary?   Chronic right shoulder pain Yes   I have reviewed the [place] Controlled Substance Reporting System web site prior to prescribing narcotic medicine for this patient.  Return in one month.  Call if any problem.  Precautions discussed.  Electronically Signed Darreld Mclean, MD 7/9/20248:23 AM

## 2022-08-01 DIAGNOSIS — R220 Localized swelling, mass and lump, head: Secondary | ICD-10-CM | POA: Diagnosis not present

## 2022-08-11 ENCOUNTER — Encounter (HOSPITAL_COMMUNITY): Payer: Self-pay

## 2022-08-11 ENCOUNTER — Ambulatory Visit (HOSPITAL_COMMUNITY): Admission: RE | Admit: 2022-08-11 | Payer: Medicare HMO | Source: Ambulatory Visit

## 2022-08-12 ENCOUNTER — Emergency Department (HOSPITAL_COMMUNITY)
Admission: EM | Admit: 2022-08-12 | Discharge: 2022-08-12 | Disposition: A | Discharge status: Home / Self Care | Payer: Medicare HMO | Attending: Emergency Medicine | Admitting: Emergency Medicine

## 2022-08-12 ENCOUNTER — Encounter (HOSPITAL_COMMUNITY): Payer: Self-pay

## 2022-08-12 ENCOUNTER — Other Ambulatory Visit: Payer: Self-pay

## 2022-08-12 DIAGNOSIS — I10 Essential (primary) hypertension: Secondary | ICD-10-CM | POA: Diagnosis not present

## 2022-08-12 DIAGNOSIS — Z7982 Long term (current) use of aspirin: Secondary | ICD-10-CM | POA: Diagnosis not present

## 2022-08-12 DIAGNOSIS — E119 Type 2 diabetes mellitus without complications: Secondary | ICD-10-CM | POA: Diagnosis not present

## 2022-08-12 DIAGNOSIS — D72829 Elevated white blood cell count, unspecified: Secondary | ICD-10-CM | POA: Insufficient documentation

## 2022-08-12 DIAGNOSIS — Z7984 Long term (current) use of oral hypoglycemic drugs: Secondary | ICD-10-CM | POA: Insufficient documentation

## 2022-08-12 DIAGNOSIS — K625 Hemorrhage of anus and rectum: Secondary | ICD-10-CM | POA: Diagnosis not present

## 2022-08-12 LAB — CBC
HCT: 42.9 % (ref 39.0–52.0)
Hemoglobin: 14.5 g/dL (ref 13.0–17.0)
MCH: 29.2 pg (ref 26.0–34.0)
MCHC: 33.8 g/dL (ref 30.0–36.0)
MCV: 86.3 fL (ref 80.0–100.0)
Platelets: 158 10*3/uL (ref 150–400)
RBC: 4.97 MIL/uL (ref 4.22–5.81)
RDW: 13.9 % (ref 11.5–15.5)
WBC: 13.8 10*3/uL — ABNORMAL HIGH (ref 4.0–10.5)
nRBC: 0 % (ref 0.0–0.2)

## 2022-08-12 LAB — TYPE AND SCREEN
ABO/RH(D): A POS
Antibody Screen: NEGATIVE

## 2022-08-12 LAB — COMPREHENSIVE METABOLIC PANEL
ALT: 22 U/L (ref 0–44)
AST: 21 U/L (ref 15–41)
Albumin: 3.2 g/dL — ABNORMAL LOW (ref 3.5–5.0)
Alkaline Phosphatase: 54 U/L (ref 38–126)
Anion gap: 9 (ref 5–15)
BUN: 19 mg/dL (ref 8–23)
CO2: 23 mmol/L (ref 22–32)
Calcium: 7.7 mg/dL — ABNORMAL LOW (ref 8.9–10.3)
Chloride: 94 mmol/L — ABNORMAL LOW (ref 98–111)
Creatinine, Ser: 0.72 mg/dL (ref 0.61–1.24)
GFR, Estimated: >60 mL/min (ref >60)
Glucose, Bld: 125 mg/dL — ABNORMAL HIGH (ref 70–99)
Potassium: 4.2 mmol/L (ref 3.5–5.1)
Sodium: 126 mmol/L — ABNORMAL LOW (ref 135–145)
Total Bilirubin: 0.7 mg/dL (ref 0.3–1.2)
Total Protein: 6 g/dL — ABNORMAL LOW (ref 6.5–8.1)

## 2022-08-12 MED ORDER — PANTOPRAZOLE SODIUM 40 MG PO TBEC
40.0000 mg | DELAYED_RELEASE_TABLET | Freq: Once | ORAL | Status: AC
  Administered 2022-08-12: 40 mg via ORAL
  Filled 2022-08-12: qty 1

## 2022-08-12 NOTE — Discharge Instructions (Signed)
Begin taking Protonix as prescribed.  Follow-up with Dr. Jena Gauss in the very near future.  Return to the ER if you develop worsening bleeding, severe abdominal pain, dizziness/lightheadedness, or for other new and concerning symptoms.

## 2022-08-12 NOTE — ED Provider Notes (Signed)
[place] EMERGENCY DEPARTMENT AT [facility] Provider Note   CSN: [id number] Arrival date & time: 08/12/22  0305     History  Chief Complaint  Patient presents with   Rectal Bleeding    [name] is a 72 y.o. male.  Patient is a 72 year old male with history of prior MI, diabetes, hyperlipidemia, above-the-knee amputation.  Patient presenting today with complaints of vomiting followed by rectal bleeding.  He initially had a bowel movement that was bright red in color, but has since turned to a black color.  He denies any fevers or chills.  He denies abdominal pain.  He had a colonoscopy in June 2023 showing no acute abnormalities.  He denies history of ulcers or gastritis.  Only blood thinner he takes is a 81 mg aspirin.  The history is provided by the patient.       Home Medications Prior to Admission medications   Medication Sig Start Date End Date Taking? Authorizing Provider  aspirin EC 81 MG tablet Take 81 mg by mouth daily.    [provider]  HYDROcodone-acetaminophen (NORCO/VICODIN) 5-325 MG tablet One tablet every six hours for pain.  Limit 7 days. 07/26/22   Darreld Mclean, MD  linagliptin (TRADJENTA) 5 MG TABS tablet Take 5 mg by mouth daily.    [provider]  lisinopril (ZESTRIL) 5 MG tablet Take 5 mg by mouth daily.    [provider]  meloxicam (MOBIC) 15 MG tablet Take one tablet daily. 09/28/21   Darreld Mclean, MD  Menthol, Topical Analgesic, (ICY HOT EX) Apply 1 application. topically daily as needed (pain).    [provider]  metFORMIN (GLUCOPHAGE) 500 MG tablet Take 500 mg by mouth 2 (two) times daily. 03/31/20   [provider]  metoprolol tartrate (LOPRESSOR) 25 MG tablet Take 0.5 tablets (12.5 mg total) by mouth 2 (two) times daily. 11/13/17   Lonia Blood, MD  nitroGLYCERIN (NITROSTAT) 0.4 MG SL tablet Place 1 tablet (0.4 mg total) under the tongue every 5 (five) minutes as needed for chest  pain. 02/24/22   Mallipeddi, Vishnu P, MD  ondansetron (ZOFRAN) 4 MG tablet Take 1 tablet (4 mg total) by mouth every 6 (six) hours as needed for nausea. 04/06/21   Rodolph Bong, MD  rosuvastatin (CRESTOR) 20 MG tablet Take 1 tablet (20 mg total) by mouth daily. 02/24/22   Mallipeddi, Vishnu P, MD  tamsulosin (FLOMAX) 0.4 MG CAPS capsule Take 0.4 mg by mouth in the morning and at bedtime.    [provider]  TRESIBA FLEXTOUCH 100 UNIT/ML FlexTouch Pen Inject 15 Units into the skin every morning. Patient taking differently: Inject 20 Units into the skin every morning. 04/06/21   Rodolph Bong, MD      Allergies    Patient has no known allergies.    Review of Systems   Review of Systems  All other systems reviewed and are negative.   Physical Exam Updated Vital Signs BP 133/84   Pulse 79   Resp 17   SpO2 95%  Physical Exam Vitals and nursing note reviewed.  Constitutional:      General: He is not in acute distress.    Appearance: He is well-developed. He is not diaphoretic.  HENT:     Head: Normocephalic and atraumatic.  Cardiovascular:     Rate and Rhythm: Normal rate and regular rhythm.     Heart sounds: No murmur heard.    No friction rub.  Pulmonary:     Effort: Pulmonary effort is normal. No respiratory distress.     Breath sounds: Normal breath sounds. No wheezing or rales.  Abdominal:     General: Bowel sounds are normal. There is no distension.     Palpations: Abdomen is soft.     Tenderness: There is no abdominal tenderness.  Genitourinary:    Rectum: Normal. Guaiac result positive.     Comments: There is dark stool noted.  No other abnormality noted on rectal exam. Musculoskeletal:        General: Normal range of motion.     Cervical back: Normal range of motion and neck supple.  Skin:    General: Skin is warm and dry.  Neurological:     Mental Status: He is alert and oriented to person, place, and time.     Coordination: Coordination normal.      ED Results / Procedures / Treatments   Labs (all labs ordered are listed, but only abnormal results are displayed) Labs Reviewed  COMPREHENSIVE METABOLIC PANEL - Abnormal; Notable for the following components:      Result Value   Sodium 126 (*)    Chloride 94 (*)    Glucose, Bld 125 (*)    Calcium 7.7 (*)    Total Protein 6.0 (*)    Albumin 3.2 (*)    All other components within normal limits  CBC - Abnormal; Notable for the following components:   WBC 13.8 (*)    All other components within normal limits  POC OCCULT BLOOD, ED  TYPE AND SCREEN    EKG EKG Interpretation Date/Time:  Friday August 12 2022 03:53:56 EDT Ventricular Rate:  81 PR Interval:  118 QRS Duration:  87 QT Interval:  353 QTC Calculation: 410 R Axis:   60  Text Interpretation: Sinus rhythm Borderline short PR interval Left atrial enlargement Probable left ventricular hypertrophy Nonspecific T abnormalities, lateral leads No significant change since 05/10/2022 Confirmed by Geoffery Lyons ([id number]) on 08/12/2022 4:28:27 AM  Radiology No results found.  Procedures Procedures    Medications Ordered in ED Medications - No data to display  ED Course/ Medical Decision Making/ A&P  Patient is a 72 year old male with past medical history as per HPI presenting with complaints of first bloody, then dark stools.  Patient denies any pain.  He arrives here with stable vital signs and is afebrile.  Physical examination basically unremarkable with the exception of heme positive stools.  Workup initiated including CBC, metabolic panel, and Hemoccult of the stool.  He has a slight leukocytosis with white count of 13.8, but hemoglobin in the normal range at 14.5.  Electrolytes basically unremarkable.  BUN is not elevated out of proportion with creatinine.  At this point, patient has a stable hemoglobin, dark greenish stools that are heme positive, but is hemodynamically stable.  He has not had any further bleeding  during his 3 hours in the ER.  I feel as though patient can safely be discharged.  I will initiate therapy with Protonix and have him follow-up in the very near future with gastroenterology.  I will send a message to Dr. Jena Gauss making him aware of the patient's situation.  Final Clinical Impression(s) / ED Diagnoses Final diagnoses:  None    Rx / DC Orders ED Discharge Orders     None         Geoffery Lyons, MD 08/12/22 [phone]

## 2022-08-12 NOTE — ED Triage Notes (Signed)
Pt presents with emesis and rectal bleeding since Saturday. Pt states stool was bright red and now has turned into black. Denies abdominal pain and just endorses emesis. Pt is not sure if he is on blood thinners or not.

## 2022-08-12 NOTE — ED Notes (Signed)
Patient verbalizes understanding of discharge instructions. Opportunity for questioning and answers were provided. Armband removed by staff, pt discharged from ED. Wheeled out to lobby  

## 2022-08-15 ENCOUNTER — Telehealth: Payer: Self-pay | Admitting: *Deleted

## 2022-08-15 NOTE — Telephone Encounter (Signed)
-----   Message from Eula Listen sent at 08/14/2022  1:37 PM EDT ----- Regarding: RE: Follow up for ER Patient Thanks Doug:  Your plan sounds reasonable.  Cythnia Osmun can we get him in - next available ----- Message ----- From: Geoffery Lyons, MD Sent: 08/12/2022   6:11 AM EDT To: Corbin Ade, MD Subject: Follow up for ER Patient                       Hello Dr. Jena Gauss.  I saw this patient in the early a.m. hours of 08/12/2022.  He presented with rectal bleeding followed by dark green, heme positive stool.  He was hemodynamically stable in the ER with hemoglobin of 14.5 and had no further hematochezia while in the ER.  He didn't seem emergent enough to admit, but do feel as though he requires expedited follow-up.  You have scoped him in the past, so I am messaging you.  Thanks,  Legrand Como ED Physician

## 2022-08-15 NOTE — Telephone Encounter (Signed)
Please schedule per Dr. Gala Romney

## 2022-08-17 ENCOUNTER — Encounter: Payer: Self-pay | Admitting: Internal Medicine

## 2022-08-18 ENCOUNTER — Ambulatory Visit (HOSPITAL_COMMUNITY): Payer: Medicare HMO

## 2022-08-22 ENCOUNTER — Other Ambulatory Visit: Payer: Self-pay | Admitting: *Deleted

## 2022-08-22 DIAGNOSIS — R222 Localized swelling, mass and lump, trunk: Secondary | ICD-10-CM

## 2022-08-23 ENCOUNTER — Ambulatory Visit (INDEPENDENT_AMBULATORY_CARE_PROVIDER_SITE_OTHER): Payer: Medicare HMO | Admitting: Orthopaedic Surgery

## 2022-08-23 ENCOUNTER — Ambulatory Visit (INDEPENDENT_AMBULATORY_CARE_PROVIDER_SITE_OTHER): Payer: Medicare HMO | Admitting: General Surgery

## 2022-08-23 ENCOUNTER — Encounter: Payer: Self-pay | Admitting: Orthopaedic Surgery

## 2022-08-23 ENCOUNTER — Encounter: Payer: Self-pay | Admitting: General Surgery

## 2022-08-23 VITALS — BP 94/67 | HR 72 | Temp 97.5°F | Resp 14 | Ht 69.0 in | Wt 144.0 lb

## 2022-08-23 VITALS — BP 100/68 | HR 81 | Ht 69.0 in | Wt 148.0 lb

## 2022-08-23 DIAGNOSIS — M25512 Pain in left shoulder: Secondary | ICD-10-CM

## 2022-08-23 DIAGNOSIS — R222 Localized swelling, mass and lump, trunk: Secondary | ICD-10-CM | POA: Diagnosis not present

## 2022-08-23 DIAGNOSIS — G8929 Other chronic pain: Secondary | ICD-10-CM | POA: Diagnosis not present

## 2022-08-23 MED ORDER — HYDROCODONE-ACETAMINOPHEN 5-325 MG PO TABS: ORAL_TABLET | ORAL | 0 refills | Status: DC

## 2022-08-23 NOTE — Progress Notes (Signed)
My shoulder hurts.  He has chronic pain of the left shoulder.  He will have removal of a cyst on the left posterior axilla area of the left shoulder later this morning by another physician.  He has pain in the right and left shoulders, with decreased motion.  NV intact.  Pain in the extremes of motion.  Encounter Diagnosis  Name Primary?   Chronic left shoulder pain Yes   I have reviewed the [place] Controlled Substance Reporting System web site prior to prescribing narcotic medicine for this patient.  Call if any problem.  Precautions discussed.  Return in one month.  He is considering possible surgery to the shoulder.  Electronically Signed Darreld Mclean, MD 8/6/20248:47 AM
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
## 2022-08-23 NOTE — Patient Instructions (Signed)
Epidermoid Cyst Removal Epidermoid cyst removal is a procedure to remove a fluid-filled sac that forms under your skin (epidermoid cyst). This type of cyst is filled with a thick, oily substance (keratin) that is secreted by your skin glands. Epidermoid cysts may also be called epidermal cysts, or keratin cysts. Normally, the skin secretes this pasty material through a gland or a hair follicle. However, when a skin gland or hair follicle becomes blocked, an epidermoid cyst can form. You may need this procedure if you have an epidermal cyst that becomes large, uncomfortable, or inflamed. Tell a health care provider about: Any allergies you have. All medicines you are taking, including vitamins, herbs, eye drops, creams, and over-the-counter medicines. Any problems you or family members have had with anesthetic medicines. Any blood disorders you have. Any surgeries you have had. Any medical conditions you have now or have had. Whether you are pregnant or may be pregnant. What are the risks? Generally, this is a safe procedure. However, problems may occur, including: Recurrence of the cyst. Bleeding. Infection. Scarring. What happens before the procedure? Ask your health care provider about: Changing or stopping your regular medicines. This is especially important if you are taking diabetes medicines or blood thinners. Taking medicines such as aspirin and ibuprofen. These medicines can thin your blood. Do not take these medicines unless your health care provider tells you to take them. Taking over-the-counter medicines, vitamins, herbs, and supplements. If you have an inflamed or infected cyst, you may have to take antibiotic medicine before the cyst removal. Take your antibiotic as told by your health care provider. Do not stop taking the antibiotic even if you start to feel better. Take a shower on the morning of your procedure. Your health care provider may ask you to use a germ-killing  soap. What happens during the procedure?  You will be given a medicine to numb the area (local anesthetic). The skin around the cyst will be cleaned with a germ-killing solution. The health care provider will make a small incision in your skin over the cyst. The health care provider will separate the cyst from the surrounding tissues that are under your skin. If possible, the cyst will be removed undamaged (intact). If the cyst bursts (ruptures), it will be removed in pieces. After the cyst is removed, the health care provider will control any bleeding and close the incision with small stitches (sutures). Small incisions may not need sutures, and the bleeding will be controlled by applying direct pressure with gauze. The health care provider may apply antibiotic ointment and a bandage (dressing) over the incision. The procedure may vary among health care providers and hospitals. What happens after the procedure? If you are prescribed an antibiotic medicine or ointment, take or apply it as told by your health care provider. Do not stop using the antibiotic even if you start to feel better. Summary Epidermoid cyst removal is a procedure to remove a sac that has formed under your skin. You may need this procedure if you have an epidermoid cyst that becomes large, uncomfortable, or inflamed. The health care provider will make a small incision in your skin to remove the cyst. If you are prescribed an antibiotic medicine before the procedure, after the procedure, or both, use the antibiotic as told by your health care provider. Do not stop using the antibiotic even if you start to feel better. This information is not intended to replace advice given to you by your health care provider. Make sure  you discuss any questions you have with your health care provider. Document Revised: 04/09/2019 Document Reviewed: 04/10/2019 Elsevier Patient Education  2024 [employer].

## 2022-08-30 ENCOUNTER — Ambulatory Visit: Payer: Medicare HMO | Admitting: Internal Medicine

## 2022-09-06 ENCOUNTER — Ambulatory Visit (INDEPENDENT_AMBULATORY_CARE_PROVIDER_SITE_OTHER): Payer: Medicare HMO | Admitting: Internal Medicine

## 2022-09-06 ENCOUNTER — Encounter: Payer: Self-pay | Admitting: Internal Medicine

## 2022-09-06 VITALS — BP 115/71 | HR 58 | Temp 97.2°F | Ht 70.0 in | Wt 144.4 lb

## 2022-09-06 DIAGNOSIS — K5900 Constipation, unspecified: Secondary | ICD-10-CM

## 2022-09-06 DIAGNOSIS — K2100 Gastro-esophageal reflux disease with esophagitis, without bleeding: Secondary | ICD-10-CM

## 2022-09-06 MED ORDER — PANTOPRAZOLE SODIUM 40 MG PO TBEC: 40.0000 mg | DELAYED_RELEASE_TABLET | Freq: Every day | ORAL | 11 refills | Status: DC

## 2022-09-06 NOTE — Patient Instructions (Signed)
It was good to see you again today!  Because you have a history of severe acid reflux esophagitis and you take meloxicam regularly, you need to be on Protonix 40 mg pill 1 daily 30 minutes before breakfast (new prescription dispense 30 with 11 refills).  For your constipation, we will begin Linzess 145 gelcap.  Take 1 daily to treat constipation.  Samples provided.  This medicine works well for you we can call in a prescription.  Will plan to see you back in 1 month.  As discussed, your aortic aneurysm and pulmonary nodule need to be followed up by Dr. Ouida Sills.
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
## 2022-09-27 ENCOUNTER — Encounter: Payer: Self-pay | Admitting: Orthopaedic Surgery

## 2022-09-27 ENCOUNTER — Ambulatory Visit (INDEPENDENT_AMBULATORY_CARE_PROVIDER_SITE_OTHER): Payer: Medicare HMO | Admitting: Orthopaedic Surgery

## 2022-09-27 VITALS — BP 117/74 | HR 58 | Ht 70.0 in | Wt 144.0 lb

## 2022-09-27 DIAGNOSIS — M47812 Spondylosis without myelopathy or radiculopathy, cervical region: Secondary | ICD-10-CM | POA: Insufficient documentation

## 2022-09-27 DIAGNOSIS — G8929 Other chronic pain: Secondary | ICD-10-CM | POA: Diagnosis not present

## 2022-09-27 DIAGNOSIS — M25511 Pain in right shoulder: Secondary | ICD-10-CM

## 2022-09-27 MED ORDER — HYDROCODONE-ACETAMINOPHEN 5-325 MG PO TABS: ORAL_TABLET | ORAL | 0 refills | Status: DC

## 2022-09-27 NOTE — Progress Notes (Signed)
My shoulder hurts.  He has more pain in the right shoulder today but does not want an injection or consider surgery.  He has limited motion.  He is taking his medicine.  He has no new trauma.  Examination of right Upper Extremity is done.  Inspection:   Overall:  Elbow non-tender without crepitus or defects, forearm non-tender without crepitus or defects, wrist non-tender without crepitus or defects, hand non-tender.    Shoulder: with glenohumeral joint tenderness, without effusion.   Upper arm:  without swelling and tenderness   Range of motion:   Overall:  Full range of motion of the elbow, full range of motion of wrist and full range of motion in fingers.   Shoulder:  right  120 degrees forward flexion; 100 degrees abduction; 20 degrees internal rotation, 20 degrees external rotation, 5 degrees extension, 40 degrees adduction.   Stability:   Overall:  Shoulder, elbow and wrist stable   Strength and Tone:   Overall full shoulder muscles strength, full upper arm strength and normal upper arm bulk and tone.   Encounter Diagnosis  Name Primary?   Chronic right shoulder pain Yes   I have reviewed the [place] Controlled Substance Reporting System web site prior to prescribing narcotic medicine for this patient.  Return in six weeks.  Call if any problem.  Precautions discussed.  Electronically Signed Darreld Mclean, MD 9/10/20248:34 AM

## 2022-10-05 DIAGNOSIS — I2510 Atherosclerotic heart disease of native coronary artery without angina pectoris: Secondary | ICD-10-CM | POA: Diagnosis not present

## 2022-10-05 DIAGNOSIS — Z79899 Other long term (current) drug therapy: Secondary | ICD-10-CM | POA: Diagnosis not present

## 2022-10-05 DIAGNOSIS — E1129 Type 2 diabetes mellitus with other diabetic kidney complication: Secondary | ICD-10-CM | POA: Diagnosis not present

## 2022-10-05 DIAGNOSIS — E785 Hyperlipidemia, unspecified: Secondary | ICD-10-CM | POA: Diagnosis not present

## 2022-10-06 ENCOUNTER — Ambulatory Visit: Payer: Medicare HMO | Admitting: General Surgery

## 2022-11-03 ENCOUNTER — Encounter: Payer: Self-pay | Admitting: General Surgery

## 2022-11-03 ENCOUNTER — Encounter: Payer: Self-pay | Admitting: Internal Medicine

## 2022-11-03 ENCOUNTER — Ambulatory Visit: Payer: Medicare HMO | Admitting: General Surgery

## 2022-11-03 VITALS — BP 121/75 | HR 72 | Temp 98.0°F | Resp 14 | Ht 70.0 in | Wt 147.0 lb

## 2022-11-03 DIAGNOSIS — L720 Epidermal cyst: Secondary | ICD-10-CM | POA: Diagnosis not present

## 2022-11-03 DIAGNOSIS — R222 Localized swelling, mass and lump, trunk: Secondary | ICD-10-CM

## 2022-11-03 MED ORDER — OXYCODONE HCL 5 MG PO TABS: 5.0000 mg | ORAL_TABLET | ORAL | 0 refills | Status: DC | PRN

## 2022-11-03 NOTE — Progress Notes (Signed)
[facility] Procedure Note  11/03/22  Pre-procedure Diagnosis:  Excision left posterior arm/ armpit/ back cyst    Post-procedure Diagnosis: Same   Procedure(s) Performed: Excision of cyst 3cm    Surgeon: Leatrice Jewels. Henreitta Leber, MD   Assistants: No qualified resident was available    Anesthesia: Lidocaine 1%    Specimens: Cyst    Estimated Blood Loss: Minimal  Wound Class: Clean contaminated    Procedure Indications: [name] is a 72 yo who has a left posterior arm/ back cyst. We discussed excision and risk of bleeding, infection, recurrence, finding something unexpected.   Findings: Cyst in the left posterior arm/ armpit/ back    Procedure: The patient was taken to the procedure room and placed on the right lateral side. The left posterior arm/ armpit area was prepared and draped in the usual sterile fashion. Lidocaine 1% was injected over the area and around the cyst.   An incision was made and carried down to the subcutaneous fat. Using sharp excisional debridement with scissors the cyst was excised in its entirety. There was some spillage of sebaceous contents, but this was contained. The cyst wall was removed in its entirety. It measured about 3cm in size. The cavity was irrigated and made hemostatic. Vicryl 3-0 interrupted was used to close the cavity. The skin was closed with 3-0 Nylon interrupted. Neosporin, gauze and tape was placed on the wound.   Final inspection revealed acceptable hemostasis. The patient tolerated the procedure well.    Future Appointments  Date Time Provider Department Center  11/08/2022  8:50 AM Darreld Mclean, MD OCR[facility] None  11/16/2022  3:30 PM Lucretia Roers, MD RS-RS None  11/23/2022  2:15 PM Nyoka Cowden, MD LBP[facility]RDS None  03/10/2023  9:00 AM Mallipeddi, Orion Modest, MD [facility]   Leave dressing in place until Saturday. Ok to change every other day or so and place neosporin on the wound and covered with  gauze and papertape.   Algis Greenhouse, MD [facility] [address] [place], [place] [address] [phone] (office)

## 2022-11-03 NOTE — Patient Instructions (Signed)
Leave dressing in place until Saturday. Ok to change every other day or so and place neosporin on the wound and covered with gauze and papertape.

## 2022-11-04 ENCOUNTER — Other Ambulatory Visit: Payer: Self-pay | Admitting: Family Medicine

## 2022-11-04 DIAGNOSIS — R222 Localized swelling, mass and lump, trunk: Secondary | ICD-10-CM

## 2022-11-08 ENCOUNTER — Encounter: Payer: Self-pay | Admitting: Orthopaedic Surgery

## 2022-11-08 ENCOUNTER — Ambulatory Visit (INDEPENDENT_AMBULATORY_CARE_PROVIDER_SITE_OTHER): Payer: Medicare HMO | Admitting: Orthopaedic Surgery

## 2022-11-08 VITALS — Ht 70.0 in | Wt 145.0 lb

## 2022-11-08 DIAGNOSIS — G8929 Other chronic pain: Secondary | ICD-10-CM

## 2022-11-08 DIAGNOSIS — M25511 Pain in right shoulder: Secondary | ICD-10-CM | POA: Diagnosis not present

## 2022-11-08 LAB — TISSUE SPECIMEN

## 2022-11-08 LAB — PATHOLOGY REPORT

## 2022-11-08 MED ORDER — OXYCODONE-ACETAMINOPHEN 5-325 MG PO TABS: 1.0000 | ORAL_TABLET | Freq: Four times a day (QID) | ORAL | 0 refills | Status: AC | PRN

## 2022-11-08 NOTE — Progress Notes (Signed)
I had surgery on my left shoulder last week.  He saw Dr. Henreitta Leber and had lesion removed from the left posterior shoulder.  She gave him 8 oxycodone and he is out now.  His left shoulder is doing well.  He has pain in the right shoulder today, with pain overhead use.  He has no trauma.  Right shoulder has near full ROM but pain in the extremes.  NV intact.  No effusion.  Encounter Diagnosis  Name Primary?   Chronic right shoulder pain Yes   PROCEDURE NOTE:  The patient request injection, verbal consent was obtained.  The right shoulder was prepped appropriately after time out was performed.   Sterile technique was observed and injection of 1 cc of DepoMedrol 40mg  with several cc's of plain xylocaine. Anesthesia was provided by ethyl chloride and a 20-gauge needle was used to inject the shoulder area. A posterior approach was used.  The injection was tolerated well.  A band aid dressing was applied.  The patient was advised to apply ice later today and tomorrow to the injection sight as needed.  I have reviewed the [place] Controlled Substance Reporting System web site prior to prescribing narcotic medicine for this patient.  Return in three weeks.  Call if any problem.  Precautions discussed.  Electronically Signed Darreld Mclean, MD 10/22/20248:54 AM

## 2022-11-09 DIAGNOSIS — M25511 Pain in right shoulder: Secondary | ICD-10-CM | POA: Diagnosis not present

## 2022-11-09 DIAGNOSIS — G8929 Other chronic pain: Secondary | ICD-10-CM | POA: Diagnosis not present

## 2022-11-09 MED ORDER — METHYLPREDNISOLONE ACETATE 40 MG/ML IJ SUSP
40.0000 mg | Freq: Once | INTRAMUSCULAR | Status: AC
  Administered 2022-11-09: 40 mg via INTRA_ARTICULAR

## 2022-11-09 NOTE — Addendum Note (Signed)
Addended by: Michaele Offer on: 11/09/2022 02:30 PM   Modules accepted: Orders

## 2022-11-14 ENCOUNTER — Encounter: Payer: Self-pay | Admitting: Orthopaedic Surgery

## 2022-11-16 ENCOUNTER — Encounter: Payer: Self-pay | Admitting: General Surgery

## 2022-11-16 ENCOUNTER — Ambulatory Visit (INDEPENDENT_AMBULATORY_CARE_PROVIDER_SITE_OTHER): Payer: Medicare HMO | Admitting: General Surgery

## 2022-11-16 VITALS — BP 101/66 | HR 59 | Temp 98.1°F | Resp 12 | Ht 70.0 in | Wt 147.0 lb

## 2022-11-16 DIAGNOSIS — R222 Localized swelling, mass and lump, trunk: Secondary | ICD-10-CM

## 2022-11-16 NOTE — Progress Notes (Signed)
[facility]  Doing well. No issues.   BP 101/66   Pulse (!) 59   Temp 98.1 F (36.7 C) (Oral)   Resp 12   Ht 5\' 10"  (1.778 m)   Wt 147 lb (66.7 kg)   SpO2 90%   BMI 21.09 kg/m  Sutures removed, steri strips placed   Patient s/p cyst excision. Doing well. Pathology with disrupted and inflamed epidermal inclusion cyst.   Steristrips will peel off in the next 5-7 days. You can remove them once they are peeling off. It is ok to shower. Pat the area dry.   Algis Greenhouse, MD [facility] [address] Vella Raring [place], [place] [address] [phone] (office)

## 2022-11-16 NOTE — Patient Instructions (Signed)
Steristrips will peel off in the next 5-7 days.  °You can remove them once they are peeling off. It is ok to shower. Pat the area dry.  ° °

## 2022-11-23 ENCOUNTER — Ambulatory Visit (INDEPENDENT_AMBULATORY_CARE_PROVIDER_SITE_OTHER): Payer: Medicare HMO | Admitting: Internal Medicine

## 2022-11-23 ENCOUNTER — Encounter: Payer: Self-pay | Admitting: Internal Medicine

## 2022-11-23 VITALS — BP 105/65 | HR 63 | Ht 70.0 in | Wt 147.0 lb

## 2022-11-23 DIAGNOSIS — I272 Pulmonary hypertension, unspecified: Secondary | ICD-10-CM

## 2022-11-23 DIAGNOSIS — R0609 Other forms of dyspnea: Secondary | ICD-10-CM

## 2022-11-23 DIAGNOSIS — R911 Solitary pulmonary nodule: Secondary | ICD-10-CM | POA: Diagnosis not present

## 2022-11-23 DIAGNOSIS — R0600 Dyspnea, unspecified: Secondary | ICD-10-CM | POA: Diagnosis not present

## 2022-11-23 NOTE — Assessment & Plan Note (Addendum)
 Quit smoking 2017 -  1st discovered in 05/10/22  LUL  - PET  11/24/22 >>>  Discussed in detail all the  indications, usual  risks and alternatives  relative to the benefits with patient who agrees to proceed with w/u as outlined.

## 2022-11-23 NOTE — Assessment & Plan Note (Addendum)
Stopped smoking 2017 with PF and emphysema on CT  05/10/22 - 11/23/2022   Walked on RA  x  3  lap(s) =  approx 450  ft  @ mod pace, stopped due to end of study  with lowest 02 sats 92%   - PFTs req 11/23/2022 >>>   Discussed in detail all the  indications, usual  risks and alternatives  relative to the benefits with patient who agrees to proceed with w/u as outlined.  Each maintenance medication was reviewed in detail including emphasizing most importantly the difference between maintenance and prns and under what circumstances the prns are to be triggered using an action plan format where appropriate.  Total time for H and P, chart review, counseling,  directly observing portions of ambulatory 02 saturation study/ and generating customized AVS unique to this office visit / same day charting = 45 min new pt eval.
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
## 2022-11-23 NOTE — Patient Instructions (Signed)
My office will be contacting you by phone for referral for PFTs next available  - if you don't hear back from my office within one week please call us back or notify us thru MyChart and we'll address it right away   I will also review your PET scan  11/24/22 when available for a recommendation regarding the nodule in the left upper lobe depending on the results

## 2022-11-24 ENCOUNTER — Ambulatory Visit (HOSPITAL_COMMUNITY)
Admission: RE | Admit: 2022-11-24 | Discharge: 2022-11-24 | Disposition: A | Discharge status: Home / Self Care | Payer: Medicare HMO | Source: Ambulatory Visit | Attending: Internal Medicine | Admitting: Internal Medicine

## 2022-11-24 ENCOUNTER — Telehealth: Payer: Self-pay | Admitting: Internal Medicine

## 2022-11-24 DIAGNOSIS — R911 Solitary pulmonary nodule: Secondary | ICD-10-CM | POA: Insufficient documentation

## 2022-11-24 DIAGNOSIS — R918 Other nonspecific abnormal finding of lung field: Secondary | ICD-10-CM | POA: Diagnosis not present

## 2022-11-24 MED ORDER — FLUDEOXYGLUCOSE F - 18 (FDG) INJECTION
7.8900 | Freq: Once | INTRAVENOUS | Status: AC | PRN
  Administered 2022-11-24: 7.89 via INTRAVENOUS

## 2022-11-24 NOTE — Telephone Encounter (Signed)
Spoke with patient regarding the upcoming PFT appointment at [facility] time is 2:45 pm---1st floor registration desk for a 3:00 pm study.  Will mail information to patient

## 2022-11-26 ENCOUNTER — Encounter (HOSPITAL_COMMUNITY): Payer: Self-pay

## 2022-11-26 ENCOUNTER — Other Ambulatory Visit: Payer: Self-pay

## 2022-11-26 ENCOUNTER — Emergency Department (HOSPITAL_COMMUNITY)
Admission: EM | Admit: 2022-11-26 | Discharge: 2022-11-27 | Disposition: A | Discharge status: Home / Self Care | Payer: Medicare HMO | Attending: Emergency Medicine | Admitting: Emergency Medicine

## 2022-11-26 ENCOUNTER — Emergency Department (HOSPITAL_COMMUNITY): Payer: Medicare HMO

## 2022-11-26 DIAGNOSIS — I2510 Atherosclerotic heart disease of native coronary artery without angina pectoris: Secondary | ICD-10-CM | POA: Diagnosis not present

## 2022-11-26 DIAGNOSIS — E119 Type 2 diabetes mellitus without complications: Secondary | ICD-10-CM | POA: Diagnosis not present

## 2022-11-26 DIAGNOSIS — R112 Nausea with vomiting, unspecified: Secondary | ICD-10-CM | POA: Diagnosis not present

## 2022-11-26 DIAGNOSIS — R1084 Generalized abdominal pain: Secondary | ICD-10-CM | POA: Diagnosis not present

## 2022-11-26 DIAGNOSIS — K7689 Other specified diseases of liver: Secondary | ICD-10-CM | POA: Diagnosis not present

## 2022-11-26 DIAGNOSIS — I714 Abdominal aortic aneurysm, without rupture, unspecified: Secondary | ICD-10-CM | POA: Diagnosis not present

## 2022-11-26 DIAGNOSIS — Z7982 Long term (current) use of aspirin: Secondary | ICD-10-CM | POA: Diagnosis not present

## 2022-11-26 DIAGNOSIS — Z7984 Long term (current) use of oral hypoglycemic drugs: Secondary | ICD-10-CM | POA: Diagnosis not present

## 2022-11-26 DIAGNOSIS — N289 Disorder of kidney and ureter, unspecified: Secondary | ICD-10-CM | POA: Diagnosis not present

## 2022-11-26 LAB — COMPREHENSIVE METABOLIC PANEL
ALT: 18 U/L (ref 0–44)
AST: 20 U/L (ref 15–41)
Albumin: 4.7 g/dL (ref 3.5–5.0)
Alkaline Phosphatase: 83 U/L (ref 38–126)
Anion gap: 16 — ABNORMAL HIGH (ref 5–15)
BUN: 14 mg/dL (ref 8–23)
CO2: 25 mmol/L (ref 22–32)
Calcium: 10.3 mg/dL (ref 8.9–10.3)
Chloride: 97 mmol/L — ABNORMAL LOW (ref 98–111)
Creatinine, Ser: 0.83 mg/dL (ref 0.61–1.24)
GFR, Estimated: >60 mL/min (ref >60)
Glucose, Bld: 203 mg/dL — ABNORMAL HIGH (ref 70–99)
Potassium: 3.9 mmol/L (ref 3.5–5.1)
Sodium: 138 mmol/L (ref 135–145)
Total Bilirubin: 0.7 mg/dL (ref <1.2)
Total Protein: 8.8 g/dL — ABNORMAL HIGH (ref 6.5–8.1)

## 2022-11-26 LAB — CBC
HCT: 52.5 % — ABNORMAL HIGH (ref 39.0–52.0)
HCT: 49.5 % (ref 39.0–52.0)
Hemoglobin: 17.5 g/dL — ABNORMAL HIGH (ref 13.0–17.0)
Hemoglobin: 15.9 g/dL (ref 13.0–17.0)
MCH: 29 pg (ref 26.0–34.0)
MCH: 28.4 pg (ref 26.0–34.0)
MCHC: 33.3 g/dL (ref 30.0–36.0)
MCHC: 32.1 g/dL (ref 30.0–36.0)
MCV: 87.1 fL (ref 80.0–100.0)
MCV: 88.4 fL (ref 80.0–100.0)
Platelets: 245 10*3/uL (ref 150–400)
Platelets: 219 10*3/uL (ref 150–400)
RBC: 6.03 MIL/uL — ABNORMAL HIGH (ref 4.22–5.81)
RBC: 5.6 MIL/uL (ref 4.22–5.81)
RDW: 14.8 % (ref 11.5–15.5)
RDW: 14.6 % (ref 11.5–15.5)
WBC: 21.5 10*3/uL — ABNORMAL HIGH (ref 4.0–10.5)
WBC: 18.4 10*3/uL — ABNORMAL HIGH (ref 4.0–10.5)
nRBC: 0 % (ref 0.0–0.2)
nRBC: 0 % (ref 0.0–0.2)

## 2022-11-26 LAB — URINALYSIS, ROUTINE W REFLEX MICROSCOPIC
Bacteria, UA: NONE SEEN
Bilirubin Urine: NEGATIVE
Glucose, UA: 150 mg/dL — AB
Ketones, ur: 20 mg/dL — AB
Leukocytes,Ua: NEGATIVE
Nitrite: NEGATIVE
Protein, ur: 100 mg/dL — AB
Specific Gravity, Urine: >1.046 — ABNORMAL HIGH (ref 1.005–1.030)
pH: 7 (ref 5.0–8.0)

## 2022-11-26 LAB — LIPASE, BLOOD: Lipase: 30 U/L (ref 11–51)

## 2022-11-26 MED ORDER — ONDANSETRON HCL 4 MG/2ML IJ SOLN
4.0000 mg | Freq: Once | INTRAMUSCULAR | Status: AC
  Administered 2022-11-26: 4 mg via INTRAVENOUS
  Filled 2022-11-26: qty 2

## 2022-11-26 MED ORDER — ONDANSETRON HCL 4 MG PO TABS: 4.0000 mg | ORAL_TABLET | Freq: Four times a day (QID) | ORAL | 0 refills | Status: DC

## 2022-11-26 MED ORDER — PROMETHAZINE HCL 25 MG PO TABS: 25.0000 mg | ORAL_TABLET | Freq: Four times a day (QID) | ORAL | 0 refills | Status: DC | PRN

## 2022-11-26 MED ORDER — IOHEXOL 300 MG/ML  SOLN
100.0000 mL | Freq: Once | INTRAMUSCULAR | Status: AC | PRN
  Administered 2022-11-26: 100 mL via INTRAVENOUS

## 2022-11-26 MED ORDER — METOCLOPRAMIDE HCL 5 MG/ML IJ SOLN
10.0000 mg | Freq: Once | INTRAMUSCULAR | Status: AC
  Administered 2022-11-26: 10 mg via INTRAVENOUS
  Filled 2022-11-26: qty 2

## 2022-11-26 MED ORDER — SODIUM CHLORIDE 0.9 % IV BOLUS
1000.0000 mL | Freq: Once | INTRAVENOUS | Status: AC
  Administered 2022-11-26: 1000 mL via INTRAVENOUS

## 2022-11-26 MED ORDER — KETOROLAC TROMETHAMINE 30 MG/ML IJ SOLN
30.0000 mg | Freq: Once | INTRAMUSCULAR | Status: AC
  Administered 2022-11-26: 30 mg via INTRAVENOUS
  Filled 2022-11-26: qty 1

## 2022-11-26 MED ORDER — SODIUM CHLORIDE 0.9 % IV BOLUS
500.0000 mL | Freq: Once | INTRAVENOUS | Status: AC
  Administered 2022-11-26: 500 mL via INTRAVENOUS

## 2022-11-26 MED ORDER — PROMETHAZINE HCL 25 MG RE SUPP: 25.0000 mg | Freq: Four times a day (QID) | RECTAL | 0 refills | Status: DC | PRN

## 2022-11-26 NOTE — ED Notes (Signed)
Pt denied any more episodes of vomiting.

## 2022-11-26 NOTE — Discharge Instructions (Addendum)
It was a pleasure taking care of you this afternoon.  You were evaluated in the emergency department for nausea, vomiting and abdominal pain. Laboratory work was performed showing which did show a elevation in your white blood count.  This can be elevated in the setting of an infection or persistent vomiting. A CT scan was also performed of your abdomen showing no acute abnormality. You were given Reglan and Zofran for Nausea and Toradol for pain.  You are being discharged with antinausea medication.  If you are unable to tolerate nausea medication by mouth I have added a suppository nausea medication to be given rectally.  If you experience worsening abdominal pain, persistent nausea and vomiting, fevers or chills please return to the emergency department for further evaluation.  Otherwise please present to your primary care physician within the next 3 days for further care.

## 2022-11-26 NOTE — ED Notes (Signed)
Pt had several episodes of vomiting. EDP notified.

## 2022-11-26 NOTE — ED Notes (Signed)
Pt lying in bed sleeping. No apparent distress.

## 2022-11-26 NOTE — ED Triage Notes (Signed)
Nausea vomiting and decreased appetite after PET scan yesterday   Denies pain

## 2022-11-26 NOTE — ED Provider Notes (Signed)
[place] EMERGENCY DEPARTMENT AT [facility] Provider Note   CSN: [id number] Arrival date & time: 11/26/22  1350     History  Chief Complaint  Patient presents with   Emesis    [name] is a 72 y.o. male with history of diabetes, cannabis hyperemesis, CAD, AAA, status post aortic valve replacement, GERD, presents with complaints of nausea, vomiting and abdominal pain that started yesterday after he returned from getting a PET scan.  He states he is vomited close to 20 times.  Abdominal pain is generalized but worse in left upper quadrant.  He denies fevers.  Notes he had some diarrhea yesterday but none today.  Reports last time that he started marijuana was prior to the PET scan.  States he took some medication for nausea this morning without any notable improvement.  Does not recall which medication this is.   Emesis       Home Medications Prior to Admission medications   Medication Sig Start Date End Date Taking? Authorizing Provider  ondansetron (ZOFRAN) 4 MG tablet Take 1 tablet (4 mg total) by mouth every 6 (six) hours. 11/26/22  Yes Halford Decamp, PA-C  promethazine (PHENERGAN) 25 MG suppository Place 1 suppository (25 mg total) rectally every 6 (six) hours as needed for nausea or vomiting. 11/26/22  Yes Halford Decamp, PA-C  promethazine (PHENERGAN) 25 MG tablet Take 1 tablet (25 mg total) by mouth every 6 (six) hours as needed for nausea or vomiting. 11/26/22  Yes Halford Decamp, PA-C  aspirin EC 81 MG tablet Take 81 mg by mouth daily.    [provider]  HYDROcodone-acetaminophen (NORCO/VICODIN) 5-325 MG tablet One tablet by mouth every six hours as needed for pain.  Seven day limit 09/27/22   Darreld Mclean, MD  linagliptin (TRADJENTA) 5 MG TABS tablet Take 5 mg by mouth daily.    [provider]  lisinopril (ZESTRIL) 5 MG tablet Take 5 mg by mouth daily.    [provider]  metFORMIN (GLUCOPHAGE) 500 MG tablet Take  500 mg by mouth 2 (two) times daily. 03/31/20   [provider]  metoprolol tartrate (LOPRESSOR) 25 MG tablet Take 0.5 tablets (12.5 mg total) by mouth 2 (two) times daily. 11/13/17   Lonia Blood, MD  nitroGLYCERIN (NITROSTAT) 0.4 MG SL tablet Place 1 tablet (0.4 mg total) under the tongue every 5 (five) minutes as needed for chest pain. 02/24/22   Mallipeddi, Vishnu P, MD  pantoprazole (PROTONIX) 40 MG tablet Take 1 tablet (40 mg total) by mouth daily. 09/06/22   Rourk, [name], MD  rosuvastatin (CRESTOR) 20 MG tablet Take 1 tablet (20 mg total) by mouth daily. 02/24/22   Mallipeddi, Vishnu P, MD  tamsulosin (FLOMAX) 0.4 MG CAPS capsule Take 0.4 mg by mouth in the morning and at bedtime.    [provider]  TRESIBA FLEXTOUCH 100 UNIT/ML FlexTouch Pen Inject 15 Units into the skin every morning. Patient taking differently: Inject 20 Units into the skin every morning. 04/06/21   Rodolph Bong, MD      Allergies    Patient has no known allergies.    Review of Systems   Review of Systems  Gastrointestinal:  Positive for vomiting.    Physical Exam Updated Vital Signs BP (!) 187/116   Pulse 95   Temp 98.6 F (37 C) (Oral)   Resp 17   Ht 5\' 10"  (1.778 m)   Wt 86.2 kg   SpO2  96%   BMI 27.26 kg/m  Physical Exam Vitals and nursing note reviewed.  Constitutional:      General: He is not in acute distress.    Appearance: He is well-developed.  HENT:     Head: Normocephalic and atraumatic.  Eyes:     Conjunctiva/sclera: Conjunctivae normal.  Cardiovascular:     Rate and Rhythm: Regular rhythm. Tachycardia present.     Heart sounds: No murmur heard. Pulmonary:     Effort: Pulmonary effort is normal. No respiratory distress.     Breath sounds: Normal breath sounds.  Abdominal:     Palpations: Abdomen is soft.     Tenderness: There is abdominal tenderness.     Comments: Generalized tenderness, worse in left upper quadrant.  No peritoneal signs   Musculoskeletal:        General: No swelling.     Cervical back: Neck supple.     Right lower leg: No edema.     Left lower leg: No edema.  Skin:    General: Skin is warm and dry.     Capillary Refill: Capillary refill takes less than 2 seconds.  Neurological:     Mental Status: He is alert.  Psychiatric:        Mood and Affect: Mood normal.     ED Results / Procedures / Treatments   Labs (all labs ordered are listed, but only abnormal results are displayed) Labs Reviewed  COMPREHENSIVE METABOLIC PANEL - Abnormal; Notable for the following components:      Result Value   Chloride 97 (*)    Glucose, Bld 203 (*)    Total Protein 8.8 (*)    Anion gap 16 (*)    All other components within normal limits  CBC - Abnormal; Notable for the following components:   WBC 21.5 (*)    RBC 6.03 (*)    Hemoglobin 17.5 (*)    HCT 52.5 (*)    All other components within normal limits  URINALYSIS, ROUTINE W REFLEX MICROSCOPIC - Abnormal; Notable for the following components:   Specific Gravity, Urine >1.046 (*)    Glucose, UA 150 (*)    Hgb urine dipstick SMALL (*)    Ketones, ur 20 (*)    Protein, ur 100 (*)    All other components within normal limits  CBC - Abnormal; Notable for the following components:   WBC 18.4 (*)    All other components within normal limits  LIPASE, BLOOD    EKG None  Radiology CT ABDOMEN PELVIS W CONTRAST  Result Date: 11/26/2022 CLINICAL DATA:  Abdominal pain. Nausea vomiting and decreased appetite after PET scan yesterday. PET scan performed for lung nodule. EXAM: CT ABDOMEN AND PELVIS WITH CONTRAST TECHNIQUE: Multidetector CT imaging of the abdomen and pelvis was performed using the standard protocol following bolus administration of intravenous contrast. RADIATION DOSE REDUCTION: This exam was performed according to the departmental dose-optimization program which includes automated exposure control, adjustment of the mA and/or kV according to patient  size and/or use of iterative reconstruction technique. CONTRAST:  OMNIPAQUE IOHEXOL 300 MG/ML  SOLN COMPARISON:  CTA of 05/10/2022 FINDINGS: Lower chest: Emphysema. Normal heart size without pericardial or pleural effusion. Native coronary artery calcification in the setting of prior median sternotomy. Distal esophageal wall thickening. Hepatobiliary: Tiny right hepatic lobe cyst. Suspect hepatic steatosis with more focal steatosis adjacent the falciform. Subtle dependent gallstones suspected. No acute cholecystitis or biliary duct dilatation. Pancreas: Normal, without mass or ductal dilatation. Spleen:  Normal in size, without focal abnormality. Adrenals/Urinary Tract: Bilateral adrenal thickening and mild nodularity are unchanged. Interpolar right renal too small to characterize lesion . In the absence of clinically indicated signs/symptoms require(s) no independent follow-up. Normal left kidney. No hydronephrosis. Normal urinary bladder. Stomach/Bowel: Normal stomach, without wall thickening. Normal colon, appendix, and terminal ileum. Normal small bowel. Vascular/Lymphatic: Advanced aortic and branch vessel atherosclerosis. Status post aortic endograft repair. Native sac on the order of 3.4 cm today versus 3.9 cm when measured in a similar level on the prior. 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 No abdominopelvic adenopathy. Reproductive: Normal prostate. Right testicle is likely positioned in the right inguinal canal, similar. Other: No significant free fluid.  No free intraperitoneal air. Musculoskeletal: No acute osseous abnormality. IMPRESSION: 1.  No acute process or explanation for abdominal plain/nausea. 2. 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 3. Probable cholelithiasis and hepatic steatosis 4. Aortic atherosclerosis (ICD10-I70.0) and emphysema (ICD10-J43.9). Electronically Signed   By: Jeronimo Greaves M.D.   On: 11/26/2022 17:53     Procedures Procedures    Medications Ordered in ED Medications  metoCLOPramide (REGLAN) injection 10 mg (10 mg Intravenous Given 11/26/22 1556)  ketorolac (TORADOL) 30 MG/ML injection 30 mg (30 mg Intravenous Given 11/26/22 1557)  iohexol (OMNIPAQUE) 300 MG/ML solution 100 mL (100 mLs Intravenous Contrast Given 11/26/22 1631)  sodium chloride 0.9 % bolus 1,000 mL (0 mLs Intravenous Stopped 11/26/22 2037)  sodium chloride 0.9 % bolus 500 mL (500 mLs Intravenous New Bag/Given 11/26/22 2127)  ondansetron (ZOFRAN) injection 4 mg (4 mg Intravenous Given 11/26/22 2127)    ED Course/ Medical Decision Making/ A&P Clinical Course as of 11/26/22 2337  Sat Nov 26, 2022  1536 Laboratory workup notable for leukocytosis of 21.  Abdominal exam is nonfocal.  Unclear etiology, will proceed with antinausea medication and CT scan of abdomen pelvis. [JT]    Clinical Course User Index [JT] Halford Decamp, PA-C                                 Medical Decision Making Amount and/or Complexity of Data Reviewed Labs: ordered. Radiology: ordered.  Risk Prescription drug management.     Patient presents to the ED for concern of nausea, vomiting, abdominal pain this involves an extensive number of treatment options, and is a complaint that carries with it a high risk of complications and morbidity.  The differential diagnosis includes cholecystitis, pancreatitis, diverticulitis, appendicitis, gastroparesis, cannabis hyperemesis.   Co morbidities that complicate the patient evaluation  Diabetes, AAA, marijuana use   Additional history obtained:  Additional history obtained from none    External records from outside source obtained and reviewed including recent PET scan imaging and chart   Lab Tests:  I Ordered, and personally interpreted labs.  The pertinent results include:  Leukocytosis of 21.5   Imaging Studies ordered:  I ordered imaging studies including CT abdomen pelvis I  independently visualized and interpreted imaging which showed abnormality I agree with the radiologist interpretation   Cardiac Monitoring:  Not indicated   Medicines ordered and prescription drug management:  I ordered medication including an and Toradol for nausea and pain respectively Reevaluation of the patient after these medicines showed that the patient improved I have reviewed the patients home medicines and have made adjustments as needed    Critical Interventions:  None   Consultations Obtained:  None Indicated  Problem List / ED Course:  No acute findings or surgical pathology on CT.  Notable leukocytosis suspicious for hemoconcentration.  Vitals have improved.  However patient still complaining of feeling weak and dehydrated.  Patient given 1 L NS.   Reevaluation:  After the interventions noted above, I reevaluated the patient and found that they have :stayed the same. P.o. challenge patient was noted to have repeated episodes of emesis.  He was given another 500 mL of normal saline and Zofran.  CBC repeated demonstrating decline in WBC.  Suspect elevations were likely to hemoconcentration. During the duration of his visit his vitals have remained stable, afebrile.  His abdominal exam is remained nonfocal.  He has vomited intermittently throughout the night, however he has spent the predominance of this visit resting.  Overall negative workup.  He does have a history of prior visits with similar symptoms and evaluations. Social Determinants of Health:  Marijuana usage   Dispostion:  After consideration of the diagnostic results and the patients response to treatment, I feel that the patent would benefit from discharge home with antinausea medication and plan for close follow-up with PCP in 3 days.          Final Clinical Impression(s) / ED Diagnoses Final diagnoses:  Nausea and vomiting, unspecified vomiting type  Generalized abdominal pain    Rx /  DC Orders ED Discharge Orders          Ordered    promethazine (PHENERGAN) 25 MG tablet  Every 6 hours PRN        11/26/22 2328    promethazine (PHENERGAN) 25 MG suppository  Every 6 hours PRN        11/26/22 2328    ondansetron (ZOFRAN) 4 MG tablet  Every 6 hours        11/26/22 2328              Halford Decamp, PA-C 11/26/22 2340    Eber Hong, MD 11/27/22 2027

## 2022-11-26 NOTE — ED Triage Notes (Signed)
Heavy smell of mariguana in triage Pt stated he smoke 2-3 blunts a day  When asked how much he smoked today pt stated "not enough"

## 2022-11-27 ENCOUNTER — Observation Stay (HOSPITAL_COMMUNITY)
Admission: EM | Admit: 2022-11-27 | Discharge: 2022-11-28 | Disposition: A | Discharge status: Home / Self Care | Payer: Medicare HMO | Attending: Emergency Medicine | Admitting: Emergency Medicine

## 2022-11-27 ENCOUNTER — Other Ambulatory Visit: Payer: Self-pay

## 2022-11-27 ENCOUNTER — Encounter (HOSPITAL_COMMUNITY): Payer: Self-pay

## 2022-11-27 DIAGNOSIS — S78112A Complete traumatic amputation at level between left hip and knee, initial encounter: Secondary | ICD-10-CM | POA: Diagnosis present

## 2022-11-27 DIAGNOSIS — R1110 Vomiting, unspecified: Secondary | ICD-10-CM | POA: Diagnosis present

## 2022-11-27 DIAGNOSIS — Z87430 Personal history of prostatic dysplasia: Secondary | ICD-10-CM | POA: Insufficient documentation

## 2022-11-27 DIAGNOSIS — Z87891 Personal history of nicotine dependence: Secondary | ICD-10-CM | POA: Insufficient documentation

## 2022-11-27 DIAGNOSIS — Z7901 Long term (current) use of anticoagulants: Secondary | ICD-10-CM | POA: Diagnosis not present

## 2022-11-27 DIAGNOSIS — E119 Type 2 diabetes mellitus without complications: Secondary | ICD-10-CM

## 2022-11-27 DIAGNOSIS — Z89612 Acquired absence of left leg above knee: Secondary | ICD-10-CM | POA: Insufficient documentation

## 2022-11-27 DIAGNOSIS — Z79899 Other long term (current) drug therapy: Secondary | ICD-10-CM | POA: Insufficient documentation

## 2022-11-27 DIAGNOSIS — Z7984 Long term (current) use of oral hypoglycemic drugs: Secondary | ICD-10-CM | POA: Diagnosis not present

## 2022-11-27 DIAGNOSIS — Z952 Presence of prosthetic heart valve: Secondary | ICD-10-CM | POA: Diagnosis not present

## 2022-11-27 DIAGNOSIS — I10 Essential (primary) hypertension: Secondary | ICD-10-CM | POA: Diagnosis not present

## 2022-11-27 DIAGNOSIS — R1033 Periumbilical pain: Secondary | ICD-10-CM | POA: Diagnosis present

## 2022-11-27 DIAGNOSIS — R112 Nausea with vomiting, unspecified: Secondary | ICD-10-CM | POA: Diagnosis not present

## 2022-11-27 DIAGNOSIS — F1090 Alcohol use, unspecified, uncomplicated: Secondary | ICD-10-CM | POA: Diagnosis not present

## 2022-11-27 DIAGNOSIS — N400 Enlarged prostate without lower urinary tract symptoms: Secondary | ICD-10-CM | POA: Diagnosis present

## 2022-11-27 DIAGNOSIS — E785 Hyperlipidemia, unspecified: Secondary | ICD-10-CM | POA: Diagnosis not present

## 2022-11-27 DIAGNOSIS — I2510 Atherosclerotic heart disease of native coronary artery without angina pectoris: Secondary | ICD-10-CM | POA: Diagnosis not present

## 2022-11-27 DIAGNOSIS — R000 Tachycardia, unspecified: Secondary | ICD-10-CM | POA: Diagnosis not present

## 2022-11-27 LAB — GLUCOSE, CAPILLARY: Glucose-Capillary: 136 mg/dL — ABNORMAL HIGH (ref 70–99)

## 2022-11-27 LAB — COMPREHENSIVE METABOLIC PANEL
ALT: 17 U/L (ref 0–44)
AST: 21 U/L (ref 15–41)
Albumin: 4.3 g/dL (ref 3.5–5.0)
Alkaline Phosphatase: 82 U/L (ref 38–126)
Anion gap: 14 (ref 5–15)
BUN: 24 mg/dL — ABNORMAL HIGH (ref 8–23)
CO2: 24 mmol/L (ref 22–32)
Calcium: 9.1 mg/dL (ref 8.9–10.3)
Chloride: 93 mmol/L — ABNORMAL LOW (ref 98–111)
Creatinine, Ser: 0.98 mg/dL (ref 0.61–1.24)
GFR, Estimated: >60 mL/min (ref >60)
Glucose, Bld: 192 mg/dL — ABNORMAL HIGH (ref 70–99)
Potassium: 3.6 mmol/L (ref 3.5–5.1)
Sodium: 131 mmol/L — ABNORMAL LOW (ref 135–145)
Total Bilirubin: 1 mg/dL (ref <1.2)
Total Protein: 8.4 g/dL — ABNORMAL HIGH (ref 6.5–8.1)

## 2022-11-27 LAB — CBC WITH DIFFERENTIAL/PLATELET
Abs Immature Granulocytes: 0.16 10*3/uL — ABNORMAL HIGH (ref 0.00–0.07)
Basophils Absolute: 0.1 10*3/uL (ref 0.0–0.1)
Basophils Relative: 0 %
Eosinophils Absolute: 0 10*3/uL (ref 0.0–0.5)
Eosinophils Relative: 0 %
HCT: 50.8 % (ref 39.0–52.0)
Hemoglobin: 16.9 g/dL (ref 13.0–17.0)
Immature Granulocytes: 1 %
Lymphocytes Relative: 9 %
Lymphs Abs: 2.1 10*3/uL (ref 0.7–4.0)
MCH: 29.1 pg (ref 26.0–34.0)
MCHC: 33.3 g/dL (ref 30.0–36.0)
MCV: 87.6 fL (ref 80.0–100.0)
Monocytes Absolute: 2.1 10*3/uL — ABNORMAL HIGH (ref 0.1–1.0)
Monocytes Relative: 9 %
Neutro Abs: 18.4 10*3/uL — ABNORMAL HIGH (ref 1.7–7.7)
Neutrophils Relative %: 81 %
Platelets: 217 10*3/uL (ref 150–400)
RBC: 5.8 MIL/uL (ref 4.22–5.81)
RDW: 14.6 % (ref 11.5–15.5)
WBC: 22.8 10*3/uL — ABNORMAL HIGH (ref 4.0–10.5)
nRBC: 0 % (ref 0.0–0.2)

## 2022-11-27 LAB — LACTIC ACID, PLASMA: Lactic Acid, Venous: 1.6 mmol/L (ref 0.5–1.9)

## 2022-11-27 LAB — LIPASE, BLOOD: Lipase: 37 U/L (ref 11–51)

## 2022-11-27 MED ORDER — ENOXAPARIN SODIUM 40 MG/0.4ML IJ SOSY
40.0000 mg | PREFILLED_SYRINGE | INTRAMUSCULAR | Status: DC
  Administered 2022-11-27: 40 mg via SUBCUTANEOUS
  Filled 2022-11-27: qty 0.4

## 2022-11-27 MED ORDER — MORPHINE SULFATE (PF) 4 MG/ML IV SOLN
4.0000 mg | Freq: Once | INTRAVENOUS | Status: AC
  Administered 2022-11-27: 4 mg via INTRAVENOUS
  Filled 2022-11-27: qty 1

## 2022-11-27 MED ORDER — INSULIN ASPART 100 UNIT/ML IJ SOLN
0.0000 [IU] | Freq: Four times a day (QID) | INTRAMUSCULAR | Status: DC
  Administered 2022-11-27: 1 [IU] via SUBCUTANEOUS
  Administered 2022-11-28: 2 [IU] via SUBCUTANEOUS
  Administered 2022-11-28: 1 [IU] via SUBCUTANEOUS

## 2022-11-27 MED ORDER — ONDANSETRON HCL 4 MG PO TABS: 4.0000 mg | ORAL_TABLET | Freq: Four times a day (QID) | ORAL | Status: DC | PRN

## 2022-11-27 MED ORDER — ONDANSETRON HCL 4 MG/2ML IJ SOLN
4.0000 mg | Freq: Once | INTRAMUSCULAR | Status: AC
  Administered 2022-11-27: 4 mg via INTRAVENOUS
  Filled 2022-11-27: qty 2

## 2022-11-27 MED ORDER — POTASSIUM CHLORIDE IN NACL 20-0.9 MEQ/L-% IV SOLN: INTRAVENOUS | Status: DC

## 2022-11-27 MED ORDER — LABETALOL HCL 5 MG/ML IV SOLN: 10.0000 mg | INTRAVENOUS | Status: DC | PRN

## 2022-11-27 MED ORDER — ACETAMINOPHEN 325 MG PO TABS: 650.0000 mg | ORAL_TABLET | Freq: Four times a day (QID) | ORAL | Status: DC | PRN

## 2022-11-27 MED ORDER — SODIUM CHLORIDE 0.9 % IV BOLUS
1000.0000 mL | Freq: Once | INTRAVENOUS | Status: AC
  Administered 2022-11-27: 1000 mL via INTRAVENOUS

## 2022-11-27 MED ORDER — MORPHINE SULFATE (PF) 2 MG/ML IV SOLN: 2.0000 mg | INTRAVENOUS | Status: DC | PRN

## 2022-11-27 MED ORDER — POLYETHYLENE GLYCOL 3350 17 G PO PACK: 17.0000 g | PACK | Freq: Every day | ORAL | Status: DC | PRN

## 2022-11-27 MED ORDER — ONDANSETRON HCL 4 MG/2ML IJ SOLN: 4.0000 mg | Freq: Four times a day (QID) | INTRAMUSCULAR | Status: DC | PRN

## 2022-11-27 MED ORDER — ACETAMINOPHEN 650 MG RE SUPP: 650.0000 mg | Freq: Four times a day (QID) | RECTAL | Status: DC | PRN

## 2022-11-27 NOTE — Plan of Care (Signed)
  Problem: Clinical Measurements: Goal: Diagnostic test results will improve Outcome: Not Progressing   Problem: Activity: Goal: Risk for activity intolerance will decrease Outcome: Not Progressing   Problem: Safety: Goal: Ability to remain free from injury will improve Outcome: Not Progressing   
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
## 2022-11-27 NOTE — ED Notes (Signed)
ED Provider at bedside. 

## 2022-11-27 NOTE — Assessment & Plan Note (Signed)
Intractable abdominal pain and vomiting.  CT abdomen and pelvis yesterday without acute abnormality.  Almost daily marijuana use.  Last use 5 days ago.  Likely cannabis induced hyperemesis.  Prior hospitalizations for same.  Significant leukocytosis of 22.8, likely stress reaction, but has some baseline chronic leukocytosis.  Heart rate 87-129, likely from dehydration.  Normal lactic acid 1.6. -2 L bolus -Continue NS +20 KCl 75 cc/h for 20 hours -Bowel rest with clear liquid diet -Zofran as needed

## 2022-11-27 NOTE — ED Notes (Signed)
Patient was given a cup of water. Patient able to keep the fluid down tus far.

## 2022-11-27 NOTE — ED Provider Notes (Signed)
[place] EMERGENCY DEPARTMENT AT [facility] Provider Note   CSN: [id number] Arrival date & time: 11/27/22  1818     History  Chief Complaint  Patient presents with   Emesis   HPI CALVION DELEE is a 72 y.o. male with history of diabetes, AAA, CAD, CHS, aortic valve replacement presenting for nausea and vomiting.  States he went to the pharmacy earlier this morning to pick up his nausea medicine.  He took the medicine shortly after he started to vomit and has been vomiting ever since.  Also endorsing generalized abdominal pain.  States he is a frequent marijuana user but did not use any marijuana today.  Also was seen for similar symptoms yesterday had a reassuring workup including a unremarkable CT scan.   Emesis      Home Medications Prior to Admission medications   Medication Sig Start Date End Date Taking? Authorizing Provider  aspirin EC 81 MG tablet Take 81 mg by mouth daily.    [provider]  HYDROcodone-acetaminophen (NORCO/VICODIN) 5-325 MG tablet One tablet by mouth every six hours as needed for pain.  Seven day limit 09/27/22   Darreld Mclean, MD  linagliptin (TRADJENTA) 5 MG TABS tablet Take 5 mg by mouth daily.    [provider]  lisinopril (ZESTRIL) 5 MG tablet Take 5 mg by mouth daily.    [provider]  metFORMIN (GLUCOPHAGE) 500 MG tablet Take 500 mg by mouth 2 (two) times daily. 03/31/20   [provider]  metoprolol tartrate (LOPRESSOR) 25 MG tablet Take 0.5 tablets (12.5 mg total) by mouth 2 (two) times daily. 11/13/17   Lonia Blood, MD  nitroGLYCERIN (NITROSTAT) 0.4 MG SL tablet Place 1 tablet (0.4 mg total) under the tongue every 5 (five) minutes as needed for chest pain. 02/24/22   Mallipeddi, Vishnu P, MD  ondansetron (ZOFRAN) 4 MG tablet Take 1 tablet (4 mg total) by mouth every 6 (six) hours. 11/26/22   Halford Decamp, PA-C  pantoprazole (PROTONIX) 40 MG tablet Take 1 tablet (40 mg total) by mouth  daily. 09/06/22   Rourk, [name], MD  promethazine (PHENERGAN) 25 MG suppository Place 1 suppository (25 mg total) rectally every 6 (six) hours as needed for nausea or vomiting. 11/26/22   Halford Decamp, PA-C  promethazine (PHENERGAN) 25 MG tablet Take 1 tablet (25 mg total) by mouth every 6 (six) hours as needed for nausea or vomiting. 11/26/22   Halford Decamp, PA-C  rosuvastatin (CRESTOR) 20 MG tablet Take 1 tablet (20 mg total) by mouth daily. 02/24/22   Mallipeddi, Vishnu P, MD  tamsulosin (FLOMAX) 0.4 MG CAPS capsule Take 0.4 mg by mouth in the morning and at bedtime.    [provider]  TRESIBA FLEXTOUCH 100 UNIT/ML FlexTouch Pen Inject 15 Units into the skin every morning. Patient taking differently: Inject 20 Units into the skin every morning. 04/06/21   Rodolph Bong, MD      Allergies    Patient has no known allergies.    Review of Systems   Review of Systems  Gastrointestinal:  Positive for vomiting.    Physical Exam Updated Vital Signs BP (!) 123/91   Pulse 99   Temp 97.8 F (36.6 C) (Oral)   Resp 16   Ht 5\' 10"  (1.778 m)   Wt 66.7 kg   SpO2 95%   BMI 21.09 kg/m  Physical Exam Vitals and nursing note reviewed.  HENT:  Head: Normocephalic and atraumatic.     Mouth/Throat:     Mouth: Mucous membranes are moist.     Comments: Dry mucous membranes Eyes:     General:        Right eye: No discharge.        Left eye: No discharge.     Conjunctiva/sclera: Conjunctivae normal.  Cardiovascular:     Rate and Rhythm: Regular rhythm. Tachycardia present.     Pulses: Normal pulses.     Heart sounds: Normal heart sounds.  Pulmonary:     Effort: Pulmonary effort is normal.     Breath sounds: Normal breath sounds.  Abdominal:     General: Abdomen is flat. There is no distension.     Palpations: Abdomen is soft.     Tenderness: There is generalized abdominal tenderness.  Skin:    General: Skin is warm and dry.     Comments: Sluggish skin turgor.   Smelled like marijuana.  Neurological:     General: No focal deficit present.  Psychiatric:        Mood and Affect: Mood normal.     ED Results / Procedures / Treatments   Labs (all labs ordered are listed, but only abnormal results are displayed) Labs Reviewed  CBC WITH DIFFERENTIAL/PLATELET - Abnormal; Notable for the following components:      Result Value   WBC 22.8 (*)    Neutro Abs 18.4 (*)    Monocytes Absolute 2.1 (*)    Abs Immature Granulocytes 0.16 (*)    All other components within normal limits  COMPREHENSIVE METABOLIC PANEL - Abnormal; Notable for the following components:   Sodium 131 (*)    Chloride 93 (*)    Glucose, Bld 192 (*)    BUN 24 (*)    Total Protein 8.4 (*)    All other components within normal limits  LIPASE, BLOOD  LACTIC ACID, PLASMA  URINALYSIS, ROUTINE W REFLEX MICROSCOPIC    EKG None  Radiology CT ABDOMEN PELVIS W CONTRAST  Result Date: 11/26/2022 CLINICAL DATA:  Abdominal pain. Nausea vomiting and decreased appetite after PET scan yesterday. PET scan performed for lung nodule. EXAM: CT ABDOMEN AND PELVIS WITH CONTRAST TECHNIQUE: Multidetector CT imaging of the abdomen and pelvis was performed using the standard protocol following bolus administration of intravenous contrast. RADIATION DOSE REDUCTION: This exam was performed according to the departmental dose-optimization program which includes automated exposure control, adjustment of the mA and/or kV according to patient size and/or use of iterative reconstruction technique. CONTRAST:  OMNIPAQUE IOHEXOL 300 MG/ML  SOLN COMPARISON:  CTA of 05/10/2022 FINDINGS: Lower chest: Emphysema. Normal heart size without pericardial or pleural effusion. Native coronary artery calcification in the setting of prior median sternotomy. Distal esophageal wall thickening. Hepatobiliary: Tiny right hepatic lobe cyst. Suspect hepatic steatosis with more focal steatosis adjacent the falciform. Subtle dependent  gallstones suspected. No acute cholecystitis or biliary duct dilatation. Pancreas: Normal, without mass or ductal dilatation. Spleen: Normal in size, without focal abnormality. Adrenals/Urinary Tract: Bilateral adrenal thickening and mild nodularity are unchanged. Interpolar right renal too small to characterize lesion . In the absence of clinically indicated signs/symptoms require(s) no independent follow-up. Normal left kidney. No hydronephrosis. Normal urinary bladder. Stomach/Bowel: Normal stomach, without wall thickening. Normal colon, appendix, and terminal ileum. Normal small bowel. Vascular/Lymphatic: Advanced aortic and branch vessel atherosclerosis. Status post aortic endograft repair. Native sac on the order of 3.4 cm today versus 3.9 cm when measured in a similar level on  the prior. 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 No abdominopelvic adenopathy. Reproductive: Normal prostate. Right testicle is likely positioned in the right inguinal canal, similar. Other: No significant free fluid.  No free intraperitoneal air. Musculoskeletal: No acute osseous abnormality. IMPRESSION: 1.  No acute process or explanation for abdominal plain/nausea. 2. 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 3. Probable cholelithiasis and hepatic steatosis 4. Aortic atherosclerosis (ICD10-I70.0) and emphysema (ICD10-J43.9). Electronically Signed   By: Jeronimo Greaves M.D.   On: 11/26/2022 17:53    Procedures Procedures    Medications Ordered in ED Medications  sodium chloride 0.9 % bolus 1,000 mL (has no administration in time range)  sodium chloride 0.9 % bolus 1,000 mL (0 mLs Intravenous Stopped 11/27/22 2121)  ondansetron (ZOFRAN) injection 4 mg (4 mg Intravenous Given 11/27/22 1921)  morphine (PF) 4 MG/ML injection 4 mg (4 mg Intravenous Given 11/27/22 1921)    ED Course/ Medical Decision Making/ A&P                                 Medical Decision  Making Amount and/or Complexity of Data Reviewed Labs: ordered.  Risk Prescription drug management. Decision regarding hospitalization.   Initial Impression and Ddx 72 year old well-appearing male presenting for nausea and vomiting.  Exam revealed tachycardia and general abdominal tenderness.  DDx includes CHS, intra-abdominal infection, dehydration, electrolyte derangement, sepsis, bowel obstruction, other. Patient PMH that increases complexity of ED encounter:  history of diabetes, AAA, CAD, CHS, aortic valve replacement  Interpretation of Diagnostics - I independent reviewed and interpreted the labs as followed: Hyponatremia, leukocytosis, elevated BUN  -Reviewed CT ab/pelvis scan from yesterday which revealed no acute findings  Patient Reassessment and Ultimate Disposition/Management On reassessment, patient stated that abdominal pain had improved but he was still somewhat nausea persists. Given that he has had now about 48 hours of nausea and vomiting and climbing white blood cell count felt it warranted admission for persistent nausea and vomiting.  Otherwise looks well.  Admitted to hospital service with Dr. Wendall Stade.  Patient management required discussion with the following services or consulting groups:  Hospitalist Service  Complexity of Problems Addressed Acute complicated illness or Injury  Additional Data Reviewed and Analyzed Further history obtained from: Past medical history and medications listed in the EMR, Prior ED visit notes, and Recent discharge summary  Patient Encounter Risk Assessment Consideration of hospitalization         Final Clinical Impression(s) / ED Diagnoses Final diagnoses:  Nausea and vomiting, unspecified vomiting type    Rx / DC Orders ED Discharge Orders     None         Gareth Eagle, PA-C 11/27/22 2144    Pricilla Loveless, MD 11/30/22 1510

## 2022-11-27 NOTE — Assessment & Plan Note (Addendum)
-   HgbA1c - SSI- S q6h -Hold home Tresiba 20 units nightly poor oral intake, hold metformin, ?Tradjenta

## 2022-11-27 NOTE — Assessment & Plan Note (Deleted)
Resume tamsulosin 

## 2022-11-27 NOTE — Assessment & Plan Note (Signed)
Stable. -With vomiting, hold lisinopril and tamsulosin -As needed labetalol for systolic greater than 170

## 2022-11-27 NOTE — ED Notes (Signed)
Entered the room to take the patient upstairs. Patient presented with a O2 sat of 88%.  Patient placed on 2 liters via nasal cannula.

## 2022-11-27 NOTE — ED Triage Notes (Signed)
ABD pain and vomiting  Pain 8/10 Seen yesterday Stated he didn't smoke marijuana today
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
## 2022-11-27 NOTE — ED Notes (Signed)
Pt wheeled to ED lobby. Verbalized understanding. Pt stated he will call someone.

## 2022-11-28 DIAGNOSIS — N400 Enlarged prostate without lower urinary tract symptoms: Secondary | ICD-10-CM | POA: Diagnosis not present

## 2022-11-28 DIAGNOSIS — R1110 Vomiting, unspecified: Secondary | ICD-10-CM | POA: Diagnosis not present

## 2022-11-28 DIAGNOSIS — E119 Type 2 diabetes mellitus without complications: Secondary | ICD-10-CM | POA: Diagnosis not present

## 2022-11-28 DIAGNOSIS — S78112A Complete traumatic amputation at level between left hip and knee, initial encounter: Secondary | ICD-10-CM

## 2022-11-28 DIAGNOSIS — R112 Nausea with vomiting, unspecified: Secondary | ICD-10-CM | POA: Diagnosis not present

## 2022-11-28 DIAGNOSIS — I10 Essential (primary) hypertension: Secondary | ICD-10-CM | POA: Diagnosis not present

## 2022-11-28 LAB — GLUCOSE, CAPILLARY
Glucose-Capillary: 131 mg/dL — ABNORMAL HIGH (ref 70–99)
Glucose-Capillary: 164 mg/dL — ABNORMAL HIGH (ref 70–99)

## 2022-11-28 LAB — BASIC METABOLIC PANEL
Anion gap: 7 (ref 5–15)
BUN: 24 mg/dL — ABNORMAL HIGH (ref 8–23)
CO2: 23 mmol/L (ref 22–32)
Calcium: 7.7 mg/dL — ABNORMAL LOW (ref 8.9–10.3)
Chloride: 105 mmol/L (ref 98–111)
Creatinine, Ser: 0.68 mg/dL (ref 0.61–1.24)
GFR, Estimated: >60 mL/min (ref >60)
Glucose, Bld: 109 mg/dL — ABNORMAL HIGH (ref 70–99)
Potassium: 4 mmol/L (ref 3.5–5.1)
Sodium: 135 mmol/L (ref 135–145)

## 2022-11-28 LAB — CBC
HCT: 41.3 % (ref 39.0–52.0)
Hemoglobin: 13.3 g/dL (ref 13.0–17.0)
MCH: 28.8 pg (ref 26.0–34.0)
MCHC: 32.2 g/dL (ref 30.0–36.0)
MCV: 89.4 fL (ref 80.0–100.0)
Platelets: 166 10*3/uL (ref 150–400)
RBC: 4.62 MIL/uL (ref 4.22–5.81)
RDW: 14.6 % (ref 11.5–15.5)
WBC: 16.8 10*3/uL — ABNORMAL HIGH (ref 4.0–10.5)
nRBC: 0 % (ref 0.0–0.2)

## 2022-11-28 LAB — HEMOGLOBIN A1C
Hgb A1c MFr Bld: 6.8 % — ABNORMAL HIGH (ref 4.8–5.6)
Mean Plasma Glucose: 148.46 mg/dL

## 2022-11-28 MED ORDER — INFLUENZA VAC A&B SURF ANT ADJ 0.5 ML IM SUSY
0.5000 mL | PREFILLED_SYRINGE | INTRAMUSCULAR | Status: DC
  Filled 2022-11-28: qty 0.5

## 2022-11-28 MED ORDER — PANTOPRAZOLE SODIUM 40 MG PO TBEC: 40.0000 mg | DELAYED_RELEASE_TABLET | Freq: Two times a day (BID) | ORAL | 3 refills | Status: DC

## 2022-11-28 MED ORDER — ONDANSETRON 8 MG PO TBDP: 8.0000 mg | ORAL_TABLET | Freq: Three times a day (TID) | ORAL | 0 refills | Status: DC | PRN

## 2022-11-28 MED ORDER — METOCLOPRAMIDE HCL 10 MG PO TABS: 5.0000 mg | ORAL_TABLET | Freq: Three times a day (TID) | ORAL | 0 refills | Status: DC | PRN

## 2022-11-28 NOTE — Progress Notes (Signed)
Mobility Specialist Progress Note:    11/28/22 1342  Mobility  Activity Ambulated with assistance in hallway  Level of Assistance Modified independent, requires aide device or extra time  Assistive Device Other (Comment) (Prosthetic)  Distance Ambulated (ft) 80 ft  Range of Motion/Exercises Active;All extremities  Activity Response Tolerated well  Mobility Referral Yes  $Mobility charge 1 Mobility  Mobility Specialist Start Time (ACUTE ONLY) 1325  Mobility Specialist Stop Time (ACUTE ONLY) 1340  Mobility Specialist Time Calculation (min) (ACUTE ONLY) 15 min   Pt received in bed, agreeable to mobility. ModI to stand and ambulate with no AD. Tolerated well, asx throughout. Returned pt to room, all needs met.   Lawerance Bach Mobility Specialist Please contact via Special educational needs teacher or  Rehab office at [phone]

## 2022-11-28 NOTE — Discharge Summary (Signed)
Physician Discharge Summary   Patient: [name] MRN: [id number] DOB: [birth year]  Admit date:     11/27/2022  Discharge date: 11/28/22  Discharge Physician: Vassie Loll   PCP: Carylon Perches, MD   Recommendations at discharge:  Repeat basic metabolic panel to follow electrolytes and renal function Reassess blood pressure and adjust antihypertensive regimen as needed Continue assisting patient with marijuana cessation Follow patient's CBGs fluctuation and adjust hypoglycemic regimen as required.  Discharge Diagnoses: Principal Problem:   Intractable vomiting Active Problems:   Diabetes mellitus without complication (HCC)   H/O aortic valve replacement   Unilateral AKA, left (HCC)   Essential hypertension   BPH (benign prostatic hyperplasia)  Brief Hospital admission course: As per H&P written by Dr. Mariea Clonts on 11/27/2022 [name] is a 72 y.o. male with medical history significant for left AKA, diabetes mellitus, hypertension, AV replacement, cannabis hyperemesis syndrome, coronary artery disease. Patient presented to the ED with complaints of abdominal pain and vomiting over the past few days.  He was in the ED yesterday with same complaints, WBC was elevated at 21 >> trended down to 18, he had CT abdomen and pelvis that was negative for acute abnormality, was managed conservatively and discharged. Today he reports recurrence of multiple episodes of vomiting, with recurrence of mid abdominal pain.  No urinary symptoms.  He has had prior hospitalization in the past for same- most recent 04/2022. Reports he uses cannabis almost daily, he and his friend smoke about 2 blunts almost daily.   ED Course: Tmax 97.8.  Heart rate 87-129.  Respirate rate 15-17.  Blood pressure systolic 120s to 409W.  Leukocytosis of 22.8. 1 L bolus given, morphine 4 mg and Zofran given.  Hospitalist to admit for tractable abdominal pain and vomiting.   Assessment and Plan: * Intractable  vomiting -Intractable abdominal pain and vomiting.  CT abdomen and pelvis without acute abnormality.   -Patient reporting almost daily marijuana use.  -Concerns for likely cannabis induced hyperemesis.  Prior hospitalizations for same problems appreciated in his chart.  -No acute source of infection appreciated.   -After fluid resuscitation and antiemetics provided patient's condition is stabilized -Tolerating diet at discharge. -Given prior history of diabetes and potential contribution of underlying gastroparesis will prescribe Reglan to use as needed for refractory symptoms.   -Patient has also received prescription for as needed Zofran. -Continue PPI twice a day -Outpatient follow-up with PCP in 10 days.    Essential hypertension -Overall stable. -Will resume home antihypertensive regimen -Patient advised to follow heart healthy diet.  Diabetes mellitus without complication (HCC) -Resume home hypoglycemic regimen -Patient advised to maintain adequate hydration and follow modified carbohydrate diet. -Continue outpatient follow-up with PCP to follow CBGs fluctuation and further adjust medication regimen as required.  Hyperlipidemia -Continue treatment with Crestor.  History of BPH -Continue the use of Flomax -No symptoms present at the moment.  Left AKA/PVD -Healed stump appreciated -Patient's prosthesis at bedside. -Continue risk factors modifications -Continue statin and aspirin.  Consultants: None Procedures performed: See low for x-ray reports. Disposition: Home Diet recommendation: Heart healthy/modified carbohydrate diet.  DISCHARGE MEDICATION: Allergies as of 11/28/2022   No Known Allergies      Medication List     STOP taking these medications    promethazine 25 MG suppository Commonly known as: PHENERGAN   promethazine 25 MG tablet Commonly known as: PHENERGAN       TAKE these medications    aspirin EC 81 MG tablet Take 81 mg  by mouth daily.    HYDROcodone-acetaminophen 5-325 MG tablet Commonly known as: NORCO/VICODIN One tablet by mouth every six hours as needed for pain.  Seven day limit   linagliptin 5 MG Tabs tablet Commonly known as: TRADJENTA Take 5 mg by mouth daily.   lisinopril 5 MG tablet Commonly known as: ZESTRIL Take 5 mg by mouth daily.   metFORMIN 500 MG tablet Commonly known as: GLUCOPHAGE Take 500 mg by mouth 2 (two) times daily.   metoCLOPramide 10 MG tablet Commonly known as: Reglan Take 0.5 tablets (5 mg total) by mouth every 8 (eight) hours as needed for refractory nausea / vomiting.   metoprolol tartrate 25 MG tablet Commonly known as: LOPRESSOR Take 0.5 tablets (12.5 mg total) by mouth 2 (two) times daily.   nitroGLYCERIN 0.4 MG SL tablet Commonly known as: NITROSTAT Place 1 tablet (0.4 mg total) under the tongue every 5 (five) minutes as needed for chest pain.   ondansetron 4 MG tablet Commonly known as: ZOFRAN Take 1 tablet (4 mg total) by mouth every 6 (six) hours.   ondansetron 8 MG disintegrating tablet Commonly known as: ZOFRAN-ODT Take 1 tablet (8 mg total) by mouth every 8 (eight) hours as needed for refractory nausea / vomiting, nausea or vomiting.   pantoprazole 40 MG tablet Commonly known as: PROTONIX Take 1 tablet (40 mg total) by mouth 2 (two) times daily. What changed: when to take this   rosuvastatin 20 MG tablet Commonly known as: Crestor Take 1 tablet (20 mg total) by mouth daily.   tamsulosin 0.4 MG Caps capsule Commonly known as: FLOMAX Take 0.4 mg by mouth in the morning and at bedtime.   [name] FlexTouch 100 UNIT/ML FlexTouch Pen Generic drug: insulin degludec Inject 15 Units into the skin every morning. What changed: how much to take        Follow-up Information     Carylon Perches, MD. Schedule an appointment as soon as possible for a visit in 10 day(s).   Specialty: Internal Medicine Contact information: [address] [place]  [address] [phone]                Discharge Exam: [name] Weights   11/27/22 1821 11/27/22 2159  Weight: 66.7 kg 58.1 kg   General exam: Alert, awake, oriented x 3; without complaints of nausea or vomiting currently.  Tolerated diet. Respiratory system: Clear to auscultation. Respiratory effort normal.  Good saturation on room air.  No using accessory muscles. Cardiovascular system:RRR. No rubs or gallops; no JVD. Gastrointestinal system: Abdomen is nondistended, soft and nontender. No organomegaly or masses felt. Normal bowel sounds heard. Central nervous system:No focal neurological deficits. Extremities: No cyanosis or clubbing on his right lower extremity; left AKA appreciated. Skin: No petechiae. Psychiatry: Judgement and insight appear normal. Mood & affect appropriate.    Condition at discharge: Stable and improved.  The results of significant diagnostics from this hospitalization (including imaging, microbiology, ancillary and laboratory) are listed below for reference.   Imaging Studies: CT ABDOMEN PELVIS W CONTRAST  Result Date: 11/26/2022 CLINICAL DATA:  Abdominal pain. Nausea vomiting and decreased appetite after PET scan yesterday. PET scan performed for lung nodule. EXAM: CT ABDOMEN AND PELVIS WITH CONTRAST TECHNIQUE: Multidetector CT imaging of the abdomen and pelvis was performed using the standard protocol following bolus administration of intravenous contrast. RADIATION DOSE REDUCTION: This exam was performed according to the departmental dose-optimization program which includes automated exposure control, adjustment of the mA and/or kV according to patient  size and/or use of iterative reconstruction technique. CONTRAST:  OMNIPAQUE IOHEXOL 300 MG/ML  SOLN COMPARISON:  CTA of 05/10/2022 FINDINGS: Lower chest: Emphysema. Normal heart size without pericardial or pleural effusion. Native coronary artery calcification in the setting of prior median sternotomy.  Distal esophageal wall thickening. Hepatobiliary: Tiny right hepatic lobe cyst. Suspect hepatic steatosis with more focal steatosis adjacent the falciform. Subtle dependent gallstones suspected. No acute cholecystitis or biliary duct dilatation. Pancreas: Normal, without mass or ductal dilatation. Spleen: Normal in size, without focal abnormality. Adrenals/Urinary Tract: Bilateral adrenal thickening and mild nodularity are unchanged. Interpolar right renal too small to characterize lesion . In the absence of clinically indicated signs/symptoms require(s) no independent follow-up. Normal left kidney. No hydronephrosis. Normal urinary bladder. Stomach/Bowel: Normal stomach, without wall thickening. Normal colon, appendix, and terminal ileum. Normal small bowel. Vascular/Lymphatic: Advanced aortic and branch vessel atherosclerosis. Status post aortic endograft repair. Native sac on the order of 3.4 cm today versus 3.9 cm when measured in a similar level on the prior. 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 No abdominopelvic adenopathy. Reproductive: Normal prostate. Right testicle is likely positioned in the right inguinal canal, similar. Other: No significant free fluid.  No free intraperitoneal air. Musculoskeletal: No acute osseous abnormality. IMPRESSION: 1.  No acute process or explanation for abdominal plain/nausea. 2. 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 3. Probable cholelithiasis and hepatic steatosis 4. Aortic atherosclerosis (ICD10-I70.0) and emphysema (ICD10-J43.9). Electronically Signed   By: Jeronimo Greaves M.D.   On: 11/26/2022 17:53    Microbiology: Results for orders placed or performed during the hospital encounter of 04/05/21  Resp Panel by RT-PCR (Flu A&B, Covid) Nasopharyngeal Swab     Status: None   Collection Time: 04/06/21  9:12 AM   Specimen: Nasopharyngeal Swab; Nasopharyngeal(NP) swabs in vial transport medium  Result Value  Ref Range Status   SARS Coronavirus 2 by RT PCR NEGATIVE NEGATIVE Final    Comment: (NOTE) SARS-CoV-2 target nucleic acids are NOT DETECTED.  The SARS-CoV-2 RNA is generally detectable in upper respiratory specimens during the acute phase of infection. The lowest concentration of SARS-CoV-2 viral copies this assay can detect is 138 copies/mL. A negative result does not preclude SARS-Cov-2 infection and should not be used as the sole basis for treatment or other patient management decisions. A negative result may occur with  improper specimen collection/handling, submission of specimen other than nasopharyngeal swab, presence of viral mutation(s) within the areas targeted by this assay, and inadequate number of viral copies(<138 copies/mL). A negative result must be combined with clinical observations, patient history, and epidemiological information. The expected result is Negative.  Fact Sheet for Patients:  BloggerCourse.com  Fact Sheet for Healthcare Providers:  SeriousBroker.it  This test is no t yet approved or cleared by the [place] FDA and  has been authorized for detection and/or diagnosis of SARS-CoV-2 by FDA under an Emergency Use Authorization (EUA). This EUA will remain  in effect (meaning this test can be used) for the duration of the COVID-19 declaration under Section 564(b)(1) of the Act, 21 U.S.C.section 360bbb-3(b)(1), unless the authorization is terminated  or revoked sooner.       Influenza A by PCR NEGATIVE NEGATIVE Final   Influenza B by PCR NEGATIVE NEGATIVE Final    Comment: (NOTE) The Xpert Xpress SARS-CoV-2/FLU/RSV plus assay is intended as an aid in the diagnosis of influenza from Nasopharyngeal swab specimens and should not be used as a  sole basis for treatment. Nasal washings and aspirates are unacceptable for Xpert Xpress SARS-CoV-2/FLU/RSV testing.  Fact Sheet for  Patients: BloggerCourse.com  Fact Sheet for Healthcare Providers: SeriousBroker.it  This test is not yet approved or cleared by the [place] FDA and has been authorized for detection and/or diagnosis of SARS-CoV-2 by FDA under an Emergency Use Authorization (EUA). This EUA will remain in effect (meaning this test can be used) for the duration of the COVID-19 declaration under Section 564(b)(1) of the Act, 21 U.S.C. section 360bbb-3(b)(1), unless the authorization is terminated or revoked.  Performed at [facility], [address] [place], [place] [address]     Labs: CBC: Recent Labs  Lab 11/26/22 1431 11/26/22 2126 11/27/22 1856 11/28/22 0415  WBC 21.5* 18.4* 22.8* 16.8*  NEUTROABS  --   --  18.4*  --   HGB 17.5* 15.9 16.9 13.3  HCT 52.5* 49.5 50.8 41.3  MCV 87.1 88.4 87.6 89.4  PLT 245 219 217 166   Basic Metabolic Panel: Recent Labs  Lab 11/26/22 1431 11/27/22 1856 11/28/22 0415  NA 138 131* 135  K 3.9 3.6 4.0  CL 97* 93* 105  CO2 25 24 23   GLUCOSE 203* 192* 109*  BUN 14 24* 24*  CREATININE 0.83 0.98 0.68  CALCIUM 10.3 9.1 7.7*   Liver Function Tests: Recent Labs  Lab 11/26/22 1431 11/27/22 1856  AST 20 21  ALT 18 17  ALKPHOS 83 82  BILITOT 0.7 1.0  PROT 8.8* 8.4*  ALBUMIN 4.7 4.3   CBG: Recent Labs  Lab 11/27/22 2204 11/28/22 0530 11/28/22 1100  GLUCAP 136* 131* 164*    Discharge time spent: greater than 30 minutes.  Signed: Vassie Loll, MD [facility] 11/28/2022

## 2022-11-28 NOTE — Plan of Care (Signed)
Pt has tolerated diet as given with no complaints of nausea or vomiting. Pt has ambulated in hallway with PT tech without difficulty or complaint.   Problem: Education: Goal: Knowledge of General Education information will improve Description: Including pain rating scale, medication(s)/side effects and non-pharmacologic comfort measures Outcome: Progressing   Problem: Health Behavior/Discharge Planning: Goal: Ability to manage health-related needs will improve Outcome: Progressing   Problem: Clinical Measurements: Goal: Ability to maintain clinical measurements within normal limits will improve Outcome: Progressing Goal: Will remain free from infection Outcome: Progressing Goal: Diagnostic test results will improve Outcome: Progressing Goal: Respiratory complications will improve Outcome: Progressing Goal: Cardiovascular complication will be avoided Outcome: Progressing   Problem: Activity: Goal: Risk for activity intolerance will decrease Outcome: Progressing   Problem: Nutrition: Goal: Adequate nutrition will be maintained Outcome: Progressing   Problem: Coping: Goal: Level of anxiety will decrease Outcome: Progressing   Problem: Elimination: Goal: Will not experience complications related to bowel motility Outcome: Progressing Goal: Will not experience complications related to urinary retention Outcome: Progressing   Problem: Pain Management: Goal: General experience of comfort will improve Outcome: Progressing   Problem: Safety: Goal: Ability to remain free from injury will improve Outcome: Progressing   Problem: Skin Integrity: Goal: Risk for impaired skin integrity will decrease Outcome: Progressing   Problem: Education: Goal: Ability to describe self-care measures that may prevent or decrease complications (Diabetes Survival Skills Education) will improve Outcome: Progressing Goal: Individualized Educational Video(s) Outcome: Progressing   Problem:  Coping: Goal: Ability to adjust to condition or change in health will improve Outcome: Progressing   Problem: Fluid Volume: Goal: Ability to maintain a balanced intake and output will improve Outcome: Progressing   Problem: Health Behavior/Discharge Planning: Goal: Ability to identify and utilize available resources and services will improve Outcome: Progressing Goal: Ability to manage health-related needs will improve Outcome: Progressing   Problem: Metabolic: Goal: Ability to maintain appropriate glucose levels will improve Outcome: Progressing   Problem: Nutritional: Goal: Maintenance of adequate nutrition will improve Outcome: Progressing Goal: Progress toward achieving an optimal weight will improve Outcome: Progressing   Problem: Skin Integrity: Goal: Risk for impaired skin integrity will decrease Outcome: Progressing   Problem: Tissue Perfusion: Goal: Adequacy of tissue perfusion will improve Outcome: Progressing

## 2022-11-28 NOTE — Care Management Obs Status (Signed)
MEDICARE OBSERVATION STATUS NOTIFICATION   Patient Details  Name: [name] MRN: [id number] Date of Birth: 1950/03/11   Medicare Observation Status Notification Given:  Yes    Karn Cassis, LCSW 11/28/2022, 8:10 AM

## 2022-11-28 NOTE — Progress Notes (Signed)
Pt discharged via WC to POV with all personal belongings in his possession. 

## 2022-11-28 NOTE — Progress Notes (Signed)
Transition of Care Department [facility]) has reviewed patient and no TOC needs have been identified at this time. We will continue to monitor patient advancement through interdisciplinary progression rounds. If new patient transition needs arise, please place a TOC consult.   11/28/22 0757  TOC Brief Assessment  Insurance and Status Reviewed  Patient has primary care physician Yes  Home environment has been reviewed Lives alone.  Prior level of function: Independent.  Prior/Current Home Services No current home services  Social Determinants of Health Reivew SDOH reviewed no interventions necessary  Readmission risk has been reviewed Yes  Transition of care needs no transition of care needs at this time

## 2022-11-29 ENCOUNTER — Ambulatory Visit: Payer: Medicare HMO | Admitting: Orthopaedic Surgery

## 2022-12-01 ENCOUNTER — Encounter: Payer: Self-pay | Admitting: Orthopaedic Surgery

## 2022-12-01 ENCOUNTER — Ambulatory Visit: Payer: Medicare HMO | Admitting: Orthopaedic Surgery

## 2022-12-01 VITALS — BP 131/79 | HR 99 | Ht 70.0 in | Wt 147.0 lb

## 2022-12-01 DIAGNOSIS — M25511 Pain in right shoulder: Secondary | ICD-10-CM | POA: Diagnosis not present

## 2022-12-01 DIAGNOSIS — G8929 Other chronic pain: Secondary | ICD-10-CM | POA: Diagnosis not present

## 2022-12-01 MED ORDER — OXYCODONE-ACETAMINOPHEN 5-325 MG PO TABS: 1.0000 | ORAL_TABLET | Freq: Four times a day (QID) | ORAL | 0 refills | Status: AC | PRN

## 2022-12-01 NOTE — Progress Notes (Signed)
I am better.  His right shoulder is less tender.  He has better ROM and less pain after the injection.  He is doing his exercises.  Right shoulder has near full ROM with some tenderness and pain in the extremes.  NV intact.  Encounter Diagnosis  Name Primary?   Chronic right shoulder pain Yes   I have reviewed the [place] Controlled Substance Reporting System web site prior to prescribing narcotic medicine for this patient.  Return in two months.  Call if any problem.  Precautions discussed.  Electronically Signed Darreld Mclean, MD 11/14/20248:23 AM

## 2022-12-08 DIAGNOSIS — C3412 Malignant neoplasm of upper lobe, left bronchus or lung: Secondary | ICD-10-CM | POA: Diagnosis not present

## 2022-12-09 ENCOUNTER — Telehealth: Payer: Self-pay | Admitting: Pulmonary Disease

## 2022-12-09 NOTE — Telephone Encounter (Signed)
Per Dr. Tonia Brooms, pt needs virtual visit with him the first week of December in afternoon. Called patient but was unable to leave VM. Called pt's sister, Melton Krebs (on [place]), and was able to reach her. She states she will relay the message for pt to call office to schedule visit.    Will see if pt can do virtual MyChart video visit on 12/2 at 1:30pm. Per pt's chart he currently doesn't have MyChart. Will need to send activation link if pt is agreeable.

## 2022-12-14 NOTE — Telephone Encounter (Signed)
Called and spoke to pt. Patient does not have mychart and does not want to do video visit. Patient states he would not know how to activate his Mychart if we sent code and does not have family close to help him. Next in office clinic time is on 12/19 with Dr. Tonia Brooms, this has been scheduled (double book) for an in-office visit.   Will forward to Dr. Tonia Brooms as Lorain Childes.

## 2022-12-29 ENCOUNTER — Ambulatory Visit (HOSPITAL_COMMUNITY)
Admission: RE | Admit: 2022-12-29 | Discharge: 2022-12-29 | Disposition: A | Discharge status: Home / Self Care | Payer: Medicare HMO | Source: Ambulatory Visit | Attending: Internal Medicine | Admitting: Internal Medicine

## 2022-12-29 DIAGNOSIS — R0600 Dyspnea, unspecified: Secondary | ICD-10-CM | POA: Insufficient documentation

## 2022-12-29 LAB — PULMONARY FUNCTION TEST
DL/VA % pred: 49 %
DL/VA: 1.98 ml/min/mmHg/L
DLCO unc % pred: 34 %
DLCO unc: 8.69 ml/min/mmHg
FEF 25-75 Post: 2.92 L/s
FEF 25-75 Pre: 3.09 L/s
FEF2575-%Change-Post: -5 %
FEF2575-%Pred-Post: 122 %
FEF2575-%Pred-Pre: 129 %
FEV1-%Change-Post: 0 %
FEV1-%Pred-Post: 76 %
FEV1-%Pred-Pre: 77 %
FEV1-Post: 2.43 L
FEV1-Pre: 2.45 L
FEV1FVC-%Change-Post: -2 %
FEV1FVC-%Pred-Pre: 113 %
FEV6-%Change-Post: 1 %
FEV6-%Pred-Post: 73 %
FEV6-%Pred-Pre: 71 %
FEV6-Post: 3 L
FEV6-Pre: 2.94 L
FEV6FVC-%Pred-Post: 106 %
FEV6FVC-%Pred-Pre: 106 %
FVC-%Change-Post: 1 %
FVC-%Pred-Post: 68 %
FVC-%Pred-Pre: 67 %
FVC-Post: 3 L
FVC-Pre: 2.95 L
Post FEV1/FVC ratio: 81 %
Post FEV6/FVC ratio: 100 %
Pre FEV1/FVC ratio: 83 %
Pre FEV6/FVC Ratio: 100 %
RV % pred: 71 %
RV: 1.78 L
TLC % pred: 68 %
TLC: 4.85 L

## 2022-12-29 MED ORDER — ALBUTEROL SULFATE (2.5 MG/3ML) 0.083% IN NEBU
2.5000 mg | INHALATION_SOLUTION | Freq: Once | RESPIRATORY_TRACT | Status: AC
  Administered 2022-12-29: 2.5 mg via RESPIRATORY_TRACT
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
## 2023-01-04 ENCOUNTER — Ambulatory Visit (INDEPENDENT_AMBULATORY_CARE_PROVIDER_SITE_OTHER): Payer: Medicare HMO | Admitting: Pulmonary Disease

## 2023-01-04 ENCOUNTER — Encounter: Payer: Self-pay | Admitting: Pulmonary Disease

## 2023-01-04 VITALS — BP 109/69 | HR 84 | Ht 70.0 in | Wt 149.0 lb

## 2023-01-04 DIAGNOSIS — R911 Solitary pulmonary nodule: Secondary | ICD-10-CM | POA: Diagnosis not present

## 2023-01-04 DIAGNOSIS — R942 Abnormal results of pulmonary function studies: Secondary | ICD-10-CM | POA: Diagnosis not present

## 2023-01-04 NOTE — Patient Instructions (Signed)
Thank you for visiting Dr. Tonia Brooms at [facility]. Today we recommend the following:  Orders Placed This Encounter  Procedures   Procedural/ Surgical Case Request: ROBOTIC ASSISTED NAVIGATIONAL BRONCHOSCOPY, VIDEO BRONCHOSCOPY WITH ENDOBRONCHIAL ULTRASOUND   CT Super D Chest Wo Contrast   Ambulatory referral to Pulmonology   Return in about 26 days (around 01/30/2023) for with Kandice Robinsons, NP, after Bronchoscopy.    Please do your part to reduce the spread of COVID-19.

## 2023-01-08 ENCOUNTER — Ambulatory Visit (HOSPITAL_BASED_OUTPATIENT_CLINIC_OR_DEPARTMENT_OTHER)
Admission: RE | Admit: 2023-01-08 | Discharge: 2023-01-08 | Disposition: A | Discharge status: Home / Self Care | Payer: Medicare HMO | Source: Ambulatory Visit | Attending: Pulmonary Disease | Admitting: Pulmonary Disease

## 2023-01-08 DIAGNOSIS — R911 Solitary pulmonary nodule: Secondary | ICD-10-CM | POA: Insufficient documentation

## 2023-01-08 DIAGNOSIS — Z01818 Encounter for other preprocedural examination: Secondary | ICD-10-CM | POA: Diagnosis not present

## 2023-01-08 DIAGNOSIS — J439 Emphysema, unspecified: Secondary | ICD-10-CM | POA: Diagnosis not present

## 2023-01-08 DIAGNOSIS — J8410 Pulmonary fibrosis, unspecified: Secondary | ICD-10-CM | POA: Diagnosis not present

## 2023-01-08 DIAGNOSIS — R918 Other nonspecific abnormal finding of lung field: Secondary | ICD-10-CM | POA: Diagnosis not present

## 2023-01-16 ENCOUNTER — Telehealth: Payer: Self-pay

## 2023-01-16 NOTE — Telephone Encounter (Signed)
Dr. Hilda Lias pt--- Hydrocodone-Acetaminophen 5/325 MG  Qty 28 Tablets  One tablet by mouth every six hours as needed for pain.  Seven day limit         PATIENT USES [facility] ON [address]

## 2023-01-17 DIAGNOSIS — E1129 Type 2 diabetes mellitus with other diabetic kidney complication: Secondary | ICD-10-CM | POA: Diagnosis not present

## 2023-01-23 ENCOUNTER — Other Ambulatory Visit: Payer: Self-pay

## 2023-01-23 ENCOUNTER — Encounter (HOSPITAL_COMMUNITY): Payer: Self-pay | Admitting: Pulmonary Disease

## 2023-01-23 NOTE — Progress Notes (Addendum)
 SDW call  Patient was given pre-op instructions over the phone. Patient verbalized understanding of instructions provided.     PCP - Dr. Gaither Langton Cardiologist - Dr. Diannah Maywood Pulmonary:    PPM/ICD - denies Device Orders - na Rep Notified - na   Chest x-ray - 05/10/2022 EKG -  11/27/2022 Stress Test - ECHO - 02/08/2021 Cardiac Cath - 11/14/2016  Sleep Study/sleep apnea/CPAP: denies  Type II diabetic, Has not checked his blood sugar in 10 years Fasting Blood sugar range: doesn't check How often check sugars: doesn't check Tradjenta , instructed to hold DOS Metformin , instructed to hold DOS Tresiba , instructed to use 10 units DOS, which is 50% of his regular dose   Blood Thinner Instructions: denies Aspirin  Instructions:states last dose 01/23/2023   ERAS Protcol - NPO    Anesthesia review: Yes. DM, CAD, MI, heart murmur, high cholesterol, hx heart cath   Patient denies shortness of breath, fever, cough and chest pain over the phone call  Your procedure is scheduled on Tuesday January 24, 2023  Report to [facility] Main Entrance A at  0830 A.M., then check in with the Admitting office.  Call this number if you have problems the morning of surgery:  [phone]   If you have any questions prior to your surgery date call [phone]: Open Monday-Friday 8am-4pm If you experience any cold or flu symptoms such as cough, fever, chills, shortness of breath, etc. between now and your scheduled surgery, please notify us  at the above number    Remember:  Do not eat or drink after midnight the night before your surgery  Take these medicines the morning of surgery with A SIP OF WATER :  Metoprolol , protonix , crestor , flomax   As needed: Reglan , zofran , nitroglycerine  As of today, STOP taking any Aspirin  (unless otherwise instructed by your surgeon) Aleve , Naproxen , Ibuprofen , Motrin , Advil , Goody's, BC's, all herbal medications, fish oil, and all vitamins.

## 2023-01-23 NOTE — Progress Notes (Signed)
 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 Patient underwent LHC prior to SAVR that demonstrated CTO of the mid LCx stented segment. There was mild nonobstructive disease in the RCA, LAD and intermediate branches. The mid RCA had a 40% stenosis. Echocardiogram from 02/08/2021 showed normal LVEF, G1 DD, normal right ventricular systolic function, normal functioning aortic valve. No aortic valve stenosis or regurgitation observed. He was last seen by Dr. Stacia 02/24/22, stable at that time from cardiac standpoint, 36yr followup recommended.   Follows with vascular surgery for hx of AAA s/p EVAR in 2020. CT abd/pelvis 11/26/22 showed stable endograft repair.   NIDDM2, A1c 6.8 11/27/22.  Pt will need DOS labs and eval.  EKG 11/27/22:  Sinus tachycardia. Rate 102. Supraventricular bigeminy. LAE, consider biatrial enlargement. Left ventricular hypertrophy  CT Super D Chest 01/08/23: IMPRESSION: 1. Large irregular left upper lobe lung mass medially and anteriorly measuring approximately 4.0 x 2.2 cm. 2. Stable mediastinal and hilar lymphadenopathy. 3. No metastatic pulmonary nodules are identified. 4. 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 5. Aortic atherosclerosis.  Nuclear stress 02/08/21:   T wave inversion noted in V1 through V3 at baseline and during infusion.   There were no arrhythmias during stress. There were no arrhythmias during recovery.   Diaphragmatic attenuation artifact was present. Image quality affected due to significant extracardiac activity.   LV perfusion is abnormal. There is no evidence of ischemia. There is no evidence of infarction. Defect 1: There is a medium defect with mild reduction in uptake present in the apical to basal inferior location(s) that is fixed.  Consistent with artifact caused by bowel tracer uptake and diaphragmatic attenuation.   Left ventricular function is normal. Nuclear stress EF: 61 %. No evidence of transient ischemic dilation (TID) noted.   Findings are consistent with no prior ischemia. The study is low risk.  TTE 02/08/21:  1. Left ventricular ejection fraction, by estimation, is 65 to 70%. The  left ventricle has normal function. The left ventricle has no regional  wall motion abnormalities. There is mild concentric left ventricular  hypertrophy. Left ventricular diastolic  parameters are consistent with Grade I diastolic dysfunction (impaired  relaxation). Elevated left ventricular end-diastolic pressure.   2. Right ventricular systolic function is normal. The right ventricular  size is normal. There is mildly elevated pulmonary artery systolic  pressure. The estimated right ventricular systolic pressure is 41.2 mmHg.   3. The mitral valve is normal in structure. Trivial mitral valve  regurgitation. No evidence of mitral stenosis.   4. The aortic valve is tricuspid. Aortic valve regurgitation is not  visualized. No aortic stenosis is present.   5. Aortic dilatation noted. There is borderline dilatation of the aortic  root, measuring 37 mm.   6. The inferior vena cava is normal in size with greater than 50%  respiratory variability, suggesting right atrial pressure of 3 mmHg.    Lynwood Geofm [facility]IG[facility]ERS [facility] Short Stay Center/Anesthesiology Phone [phone] 01/29/2023 8:08 PM

## 2023-01-24 DIAGNOSIS — R911 Solitary pulmonary nodule: Secondary | ICD-10-CM | POA: Insufficient documentation

## 2023-01-25 ENCOUNTER — Telehealth: Payer: Self-pay

## 2023-01-25 DIAGNOSIS — R911 Solitary pulmonary nodule: Secondary | ICD-10-CM | POA: Diagnosis not present

## 2023-01-25 DIAGNOSIS — I2510 Atherosclerotic heart disease of native coronary artery without angina pectoris: Secondary | ICD-10-CM | POA: Diagnosis not present

## 2023-01-25 DIAGNOSIS — Z89612 Acquired absence of left leg above knee: Secondary | ICD-10-CM | POA: Diagnosis not present

## 2023-01-25 DIAGNOSIS — E1122 Type 2 diabetes mellitus with diabetic chronic kidney disease: Secondary | ICD-10-CM | POA: Diagnosis not present

## 2023-01-25 DIAGNOSIS — Z23 Encounter for immunization: Secondary | ICD-10-CM | POA: Diagnosis not present

## 2023-01-25 MED ORDER — OXYCODONE-ACETAMINOPHEN 5-325 MG PO TABS: ORAL_TABLET | ORAL | 0 refills | Status: DC

## 2023-01-25 NOTE — Telephone Encounter (Signed)
Hydrocodone-Acetaminophen 5/325 MG  Qty 28 Tablets  PATIENT USES [facility] ON [address] 

## 2023-01-29 NOTE — Anesthesia Preprocedure Evaluation (Addendum)
 Anesthesia Evaluation  Patient identified by MRN, date of birth, ID band Patient awake    Reviewed: Allergy & Precautions, NPO status , Patient's Chart, lab work & pertinent test results  Airway Mallampati: II  TM Distance: >3 FB Neck ROM: Full    Dental no notable dental hx. (+) Missing, Dental Advisory Given,    Pulmonary former smoker   Pulmonary exam normal breath sounds clear to auscultation       Cardiovascular hypertension, + CAD and + Past MI  Normal cardiovascular exam+ Valvular Problems/Murmurs (S?P AVR)  Rhythm:Regular Rate:Normal  01/2021 TTE 1. Left ventricular ejection fraction, by estimation, is 65 to 70%. The  left ventricle has normal function. The left ventricle has no regional  wall motion abnormalities. There is mild concentric left ventricular  hypertrophy. Left ventricular diastolic  parameters are consistent with Grade I diastolic dysfunction (impaired  relaxation). Elevated left ventricular end-diastolic pressure.   2. Right ventricular systolic function is normal. The right ventricular  size is normal. There is mildly elevated pulmonary artery systolic  pressure. The estimated right ventricular systolic pressure is 41.2 mmHg.   3. The mitral valve is normal in structure. Trivial mitral valve  regurgitation. No evidence of mitral stenosis.   4. The aortic valve is tricuspid. Aortic valve regurgitation is not  visualized. No aortic stenosis is present.   5. Aortic dilatation noted. There is borderline dilatation of the aortic  root, measuring 37 mm.   6. The inferior vena cava is normal in size with greater than 50%  respiratory variability, suggesting right atrial pressure of 3 mmHg.      Neuro/Psych negative neurological ROS  negative psych ROS   GI/Hepatic Neg liver ROS,GERD  ,,  Endo/Other  diabetes    Renal/GU Lab Results      Component                Value               Date                           K                        4.0                 11/28/2022                 CREATININE               0.68                11/28/2022                GFRNONAA                 >60                 11/28/2022                CALCIUM                   7.7 (L)             11/28/2022                        Musculoskeletal   Abdominal   Peds  Hematology   Anesthesia Other Findings   Reproductive/Obstetrics  Anesthesia Physical Anesthesia Plan  ASA: 3  Anesthesia Plan: General   Post-op Pain Management: Minimal or no pain anticipated   Induction: Intravenous  PONV Risk Score and Plan: 3 and Treatment may vary due to age or medical condition, Propofol  infusion and TIVA  Airway Management Planned: Oral ETT  Additional Equipment: None  Intra-op Plan:   Post-operative Plan: Extubation in OR  Informed Consent: I have reviewed the patients History and Physical, chart, labs and discussed the procedure including the risks, benefits and alternatives for the proposed anesthesia with the patient or authorized representative who has indicated his/her understanding and acceptance.     Dental advisory given  Plan Discussed with: CRNA and Anesthesiologist  Anesthesia Plan Comments: (PAT note by Lynwood Hope, PA-C: 73 yo male follows with cardiology for hx of CAD s/p LCx BMS PCI in 2008, severe aortic valve stenosis s/p 23 mm Edwards pericardial valve in 2019 ([facility]). Patient underwent LHC prior to SAVR that demonstrated CTO of the mid LCx stented segment. There was mild nonobstructive disease in the RCA, LAD and intermediate branches. The mid RCA had a 40% stenosis. Echocardiogram from 02/08/2021 showed normal LVEF, G1 DD, normal right ventricular systolic function, normal functioning aortic valve. No aortic valve stenosis or regurgitation observed. He was last seen by Dr. Stacia 02/24/22, stable at that time from cardiac standpoint, 25yr  followup recommended.    Follows with vascular surgery for hx of AAA s/p EVAR in 2020. CT abd/pelvis 11/26/22 showed stable endograft repair.    NIDDM2, A1c 6.8 11/27/22.   Pt will need DOS labs and eval.   EKG 11/27/22:  Sinus tachycardia. Rate 102. Supraventricular bigeminy. LAE, consider biatrial enlargement. Left ventricular hypertrophy   CT Super D Chest 01/08/23: IMPRESSION: 1. Large irregular left upper lobe lung mass medially and anteriorly measuring approximately 4.0 x 2.2 cm. 2. Stable mediastinal and hilar lymphadenopathy. 3. No metastatic pulmonary nodules are identified. 4. 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 5. Aortic atherosclerosis.   Nuclear stress 02/08/21:   T wave inversion noted in V1 through V3 at baseline and during infusion.   There were no arrhythmias during stress. There were no arrhythmias during recovery.   Diaphragmatic attenuation artifact was present. Image quality affected due to significant extracardiac activity.   LV perfusion is abnormal. There is no evidence of ischemia. There is no evidence of infarction. Defect 1: There is a medium defect with mild reduction in uptake present in the apical to basal inferior location(s) that is fixed. Consistent with artifact caused by bowel tracer uptake and diaphragmatic attenuation.   Left ventricular function is normal. Nuclear stress EF: 61 %. No evidence of transient ischemic dilation (TID) noted.   Findings are consistent with no prior ischemia. The study is low risk.   TTE 02/08/21:  1. Left ventricular ejection fraction, by estimation, is 65 to 70%. The  left ventricle has normal function. The left ventricle has no regional  wall motion abnormalities. There is mild concentric left ventricular  hypertrophy. Left ventricular diastolic  parameters are consistent with Grade I diastolic dysfunction (impaired  relaxation). Elevated left ventricular  end-diastolic pressure.   2. Right ventricular systolic function is normal. The right ventricular  size is normal. There is mildly elevated pulmonary artery systolic  pressure. The estimated right ventricular systolic pressure is 41.2 mmHg.   3. The mitral valve is normal in structure. Trivial mitral valve  regurgitation. No evidence of mitral stenosis.   4.  The aortic valve is tricuspid. Aortic valve regurgitation is not  visualized. No aortic stenosis is present.   5. Aortic dilatation noted. There is borderline dilatation of the aortic  root, measuring 37 mm.   6. The inferior vena cava is normal in size with greater than 50%  respiratory variability, suggesting right atrial pressure of 3 mmHg.    )        Anesthesia Quick Evaluation

## 2023-01-30 ENCOUNTER — Encounter (HOSPITAL_COMMUNITY): Payer: Self-pay | Admitting: Pulmonary Disease

## 2023-01-30 ENCOUNTER — Other Ambulatory Visit: Payer: Self-pay

## 2023-01-30 NOTE — Progress Notes (Signed)
 PCP - Dr. Gaither Langton  Cardiologist - Dr. Diannah Maywood  PPM/ICD - denies   Chest x-ray - 05/10/22 EKG - 11/27/22 Stress Test - 02/08/21 ECHO - 02/08/21 Cardiac Cath - 11/14/16  CPAP - denies  DM- pt does not check CBG at home and does not know typical fasting levels  Blood Thinner Instructions: n/a Aspirin  Instructions: pt states he was not told to hold this medication. He last took it this morning 1/13 and will not take it DOS  ERAS Protcol - no, NPO  COVID TEST- n/a  Anesthesia review: yes, James, [place], reviewed on 1/6  Patient verbally denies any shortness of breath, fever, cough and chest pain during phone call   -------------  SDW INSTRUCTIONS given:  Your procedure is scheduled on 1/14.  Report to [facility] Main Entrance A at 11:15 A.M., and check in at the Admitting office.  Any questions or running late day of surgery: call [phone]    Remember:  Do not eat or drink after midnight the night before your surgery      Take these medicines the morning of surgery with A SIP OF WATER   metoprolol  tartrate (LOPRESSOR )  pantoprazole  (PROTONIX )  rosuvastatin  (CRESTOR )  tamsulosin  (FLOMAX )    May take these medicines IF NEEDED: metoCLOPramide  (REGLAN )  nitroGLYCERIN  (NITROSTAT )- If you have to take this medication prior to surgery, please call [phone] and report this to a nurse  ondansetron  (ZOFRAN )    As of today, STOP taking any Aspirin  (unless otherwise instructed by your surgeon) Aleve , Naproxen , Ibuprofen , Motrin , Advil , Goody's, BC's, all herbal medications, fish oil, and all vitamins.  WHAT DO I DO ABOUT MY DIABETES MEDICATION?   Do not take metformin  or linagliptin  the morning of surgery.     THE MORNING OF SURGERY, take 10 units of Tresiba     HOW TO MANAGE YOUR DIABETES BEFORE AND AFTER SURGERY  Why is it important to control my blood sugar before and after surgery? Improving blood sugar levels before and after surgery helps healing  and can limit problems. A way of improving blood sugar control is eating a healthy diet by:  Eating less sugar and carbohydrates  Increasing activity/exercise  Talking with your doctor about reaching your blood sugar goals High blood sugars (greater than 180 mg/dL) can raise your risk of infections and slow your recovery, so you will need to focus on controlling your diabetes during the weeks before surgery. Make sure that the doctor who takes care of your diabetes knows about your planned surgery including the date and location.  How do I manage my blood sugar before surgery? Check your blood sugar at least 4 times a day, starting 2 days before surgery, to make sure that the level is not too high or low.  Check your blood sugar the morning of your surgery when you wake up and every 2 hours until you get to the Short Stay unit.  If your blood sugar is less than 70 mg/dL, you will need to treat for low blood sugar: Do not take insulin . Treat a low blood sugar (less than 70 mg/dL) with  cup of clear juice (cranberry or apple), 4 glucose tablets, OR glucose gel. Recheck blood sugar in 15 minutes after treatment (to make sure it is greater than 70 mg/dL). If your blood sugar is not greater than 70 mg/dL on recheck, call [phone] for further instructions. Report your blood sugar to the short stay nurse when you get to Short Stay.  If you  are admitted to the hospital after surgery: Your blood sugar will be checked by the staff and you will probably be given insulin  after surgery (instead of oral diabetes medicines) to make sure you have good blood sugar levels. The goal for blood sugar control after surgery is 80-180 mg/dL.   Do NOT Smoke (Tobacco/Vaping) 24 hours prior to your procedure  If you use a CPAP at night, you may bring all equipment for your overnight stay.     You will be asked to remove any contacts, glasses, piercing's, hearing aid's, dentures/partials prior to surgery.  Please bring cases for these items if needed.     Patients discharged the day of surgery will not be allowed to drive home, and someone needs to stay with them for 24 hours.  SURGICAL WAITING ROOM VISITATION Patients may have no more than 2 support people in the waiting area - these visitors may rotate.   Pre-op nurse will coordinate an appropriate time for 1 ADULT support person, who may not rotate, to accompany patient in pre-op.  Children under the age of 48 must have an adult with them who is not the patient and must remain in the main waiting area with an adult.  If the patient needs to stay at the hospital during part of their recovery, the visitor guidelines for inpatient rooms apply.  Please refer to the [facility] website for the visitor guidelines for any additional information.   Special instructions:   [facility]- Preparing For Surgery   Please follow these instructions carefully.   Shower the NIGHT BEFORE SURGERY and the MORNING OF SURGERY with DIAL Soap.   Pat yourself dry with a CLEAN TOWEL.  Wear CLEAN PAJAMAS to bed the night before surgery  Place CLEAN SHEETS on your bed the night of your first shower and DO NOT SLEEP WITH PETS.   Additional instructions for the day of surgery: DO NOT APPLY any lotions, deodorants, cologne, or perfumes.   Do not wear jewelry or makeup Do not wear nail polish, gel polish, artificial nails, or any other type of covering on natural nails (fingers and toes) Do not bring valuables to the hospital. [facility] is not responsible for valuables/personal belongings. Put on clean/comfortable clothes.  Please brush your teeth.  Ask your nurse before applying any prescription medications to the skin.    Questions were answered. Patient verbalized understanding of instructions.

## 2023-01-31 ENCOUNTER — Encounter (HOSPITAL_COMMUNITY)
Admission: RE | Disposition: A | Discharge status: Home / Self Care | Payer: Self-pay | Source: Ambulatory Visit | Attending: Pulmonary Disease

## 2023-01-31 ENCOUNTER — Ambulatory Visit (HOSPITAL_COMMUNITY): Payer: Medicare HMO

## 2023-01-31 ENCOUNTER — Ambulatory Visit (HOSPITAL_COMMUNITY)
Admission: RE | Admit: 2023-01-31 | Discharge: 2023-01-31 | Disposition: A | Discharge status: Home / Self Care | Payer: Medicare HMO | Source: Ambulatory Visit | Attending: Pulmonary Disease | Admitting: Pulmonary Disease

## 2023-01-31 ENCOUNTER — Ambulatory Visit (HOSPITAL_COMMUNITY): Payer: Medicare HMO | Admitting: Physician Assistant

## 2023-01-31 ENCOUNTER — Ambulatory Visit (HOSPITAL_BASED_OUTPATIENT_CLINIC_OR_DEPARTMENT_OTHER): Payer: Medicare HMO | Admitting: Physician Assistant

## 2023-01-31 ENCOUNTER — Ambulatory Visit: Payer: Medicare HMO | Admitting: Acute Care

## 2023-01-31 DIAGNOSIS — I2510 Atherosclerotic heart disease of native coronary artery without angina pectoris: Secondary | ICD-10-CM

## 2023-01-31 DIAGNOSIS — I10 Essential (primary) hypertension: Secondary | ICD-10-CM | POA: Diagnosis not present

## 2023-01-31 DIAGNOSIS — Z48813 Encounter for surgical aftercare following surgery on the respiratory system: Secondary | ICD-10-CM | POA: Diagnosis not present

## 2023-01-31 DIAGNOSIS — Z794 Long term (current) use of insulin: Secondary | ICD-10-CM | POA: Insufficient documentation

## 2023-01-31 DIAGNOSIS — R911 Solitary pulmonary nodule: Secondary | ICD-10-CM | POA: Diagnosis not present

## 2023-01-31 DIAGNOSIS — E785 Hyperlipidemia, unspecified: Secondary | ICD-10-CM | POA: Diagnosis not present

## 2023-01-31 DIAGNOSIS — K219 Gastro-esophageal reflux disease without esophagitis: Secondary | ICD-10-CM | POA: Insufficient documentation

## 2023-01-31 DIAGNOSIS — Z7984 Long term (current) use of oral hypoglycemic drugs: Secondary | ICD-10-CM | POA: Insufficient documentation

## 2023-01-31 DIAGNOSIS — R918 Other nonspecific abnormal finding of lung field: Secondary | ICD-10-CM | POA: Diagnosis not present

## 2023-01-31 DIAGNOSIS — C3412 Malignant neoplasm of upper lobe, left bronchus or lung: Secondary | ICD-10-CM | POA: Diagnosis not present

## 2023-01-31 DIAGNOSIS — I252 Old myocardial infarction: Secondary | ICD-10-CM | POA: Diagnosis not present

## 2023-01-31 DIAGNOSIS — E119 Type 2 diabetes mellitus without complications: Secondary | ICD-10-CM | POA: Diagnosis not present

## 2023-01-31 DIAGNOSIS — Z87891 Personal history of nicotine dependence: Secondary | ICD-10-CM | POA: Insufficient documentation

## 2023-01-31 HISTORY — DX: Essential (primary) hypertension: I10

## 2023-01-31 HISTORY — PX: BRONCHIAL NEEDLE ASPIRATION BIOPSY: SHX5106

## 2023-01-31 HISTORY — PX: HEMOSTASIS CONTROL: SHX6838

## 2023-01-31 HISTORY — PX: BRONCHIAL BIOPSY: SHX5109

## 2023-01-31 HISTORY — PX: BRONCHIAL BRUSHINGS: SHX5108

## 2023-01-31 LAB — CBC
HCT: 37.9 % — ABNORMAL LOW (ref 39.0–52.0)
Hemoglobin: 11.8 g/dL — ABNORMAL LOW (ref 13.0–17.0)
MCH: 27.6 pg (ref 26.0–34.0)
MCHC: 31.1 g/dL (ref 30.0–36.0)
MCV: 88.6 fL (ref 80.0–100.0)
Platelets: 176 10*3/uL (ref 150–400)
RBC: 4.28 MIL/uL (ref 4.22–5.81)
RDW: 15.3 % (ref 11.5–15.5)
WBC: 6.9 10*3/uL (ref 4.0–10.5)
nRBC: 0 % (ref 0.0–0.2)

## 2023-01-31 LAB — BASIC METABOLIC PANEL
Anion gap: 10 (ref 5–15)
BUN: 6 mg/dL — ABNORMAL LOW (ref 8–23)
CO2: 23 mmol/L (ref 22–32)
Calcium: 8.7 mg/dL — ABNORMAL LOW (ref 8.9–10.3)
Chloride: 106 mmol/L (ref 98–111)
Creatinine, Ser: 0.75 mg/dL (ref 0.61–1.24)
GFR, Estimated: >60 mL/min (ref >60)
Glucose, Bld: 119 mg/dL — ABNORMAL HIGH (ref 70–99)
Potassium: 4.1 mmol/L (ref 3.5–5.1)
Sodium: 139 mmol/L (ref 135–145)

## 2023-01-31 LAB — GLUCOSE, CAPILLARY
Glucose-Capillary: 124 mg/dL — ABNORMAL HIGH (ref 70–99)
Glucose-Capillary: 94 mg/dL (ref 70–99)
Glucose-Capillary: 133 mg/dL — ABNORMAL HIGH (ref 70–99)

## 2023-01-31 SURGERY — BRONCHOSCOPY, WITH BIOPSY USING ELECTROMAGNETIC NAVIGATION
Anesthesia: General | Laterality: Left

## 2023-01-31 MED ORDER — FENTANYL CITRATE (PF) 250 MCG/5ML IJ SOLN
INTRAMUSCULAR | Status: DC | PRN
  Administered 2023-01-31: 25 ug via INTRAVENOUS

## 2023-01-31 MED ORDER — PROPOFOL 10 MG/ML IV BOLUS
INTRAVENOUS | Status: DC | PRN
  Administered 2023-01-31: 170 mg via INTRAVENOUS

## 2023-01-31 MED ORDER — SUGAMMADEX SODIUM 200 MG/2ML IV SOLN
INTRAVENOUS | Status: DC | PRN
  Administered 2023-01-31 (×2): 200 mg via INTRAVENOUS

## 2023-01-31 MED ORDER — CHLORHEXIDINE GLUCONATE 0.12 % MT SOLN
OROMUCOSAL | Status: AC
  Administered 2023-01-31: 15 mL via OROMUCOSAL
  Filled 2023-01-31: qty 15

## 2023-01-31 MED ORDER — METOPROLOL TARTRATE 12.5 MG HALF TABLET
ORAL_TABLET | ORAL | Status: AC
  Administered 2023-01-31: 12.5 mg via ORAL
  Filled 2023-01-31: qty 1

## 2023-01-31 MED ORDER — FENTANYL CITRATE (PF) 100 MCG/2ML IJ SOLN
INTRAMUSCULAR | Status: AC
  Filled 2023-01-31: qty 2

## 2023-01-31 MED ORDER — PHENYLEPHRINE 80 MCG/ML (10ML) SYRINGE FOR IV PUSH (FOR BLOOD PRESSURE SUPPORT)
PREFILLED_SYRINGE | INTRAVENOUS | Status: DC | PRN
  Administered 2023-01-31 (×3): 80 ug via INTRAVENOUS

## 2023-01-31 MED ORDER — DEXAMETHASONE SODIUM PHOSPHATE 10 MG/ML IJ SOLN
INTRAMUSCULAR | Status: DC | PRN
  Administered 2023-01-31: 10 mg via INTRAVENOUS

## 2023-01-31 MED ORDER — PROPOFOL 500 MG/50ML IV EMUL
INTRAVENOUS | Status: DC | PRN
  Administered 2023-01-31: 150 ug/kg/min via INTRAVENOUS

## 2023-01-31 MED ORDER — LIDOCAINE 2% (20 MG/ML) 5 ML SYRINGE
INTRAMUSCULAR | Status: DC | PRN
  Administered 2023-01-31: 100 mg via INTRAVENOUS

## 2023-01-31 MED ORDER — EPHEDRINE SULFATE-NACL 50-0.9 MG/10ML-% IV SOSY
PREFILLED_SYRINGE | INTRAVENOUS | Status: DC | PRN
  Administered 2023-01-31: 10 mg via INTRAVENOUS

## 2023-01-31 MED ORDER — METOPROLOL TARTRATE 12.5 MG HALF TABLET
12.5000 mg | ORAL_TABLET | Freq: Once | ORAL | Status: AC
  Filled 2023-01-31: qty 1

## 2023-01-31 MED ORDER — PHENYLEPHRINE HCL-NACL 20-0.9 MG/250ML-% IV SOLN
INTRAVENOUS | Status: DC | PRN
  Administered 2023-01-31: 30 ug/min via INTRAVENOUS

## 2023-01-31 MED ORDER — ROCURONIUM BROMIDE 10 MG/ML (PF) SYRINGE
PREFILLED_SYRINGE | INTRAVENOUS | Status: DC | PRN
  Administered 2023-01-31: 60 mg via INTRAVENOUS

## 2023-01-31 MED ORDER — SODIUM CHLORIDE (PF) 0.9 % IJ SOLN
PREFILLED_SYRINGE | INTRAMUSCULAR | Status: DC | PRN
  Administered 2023-01-31 (×4): 4 mL

## 2023-01-31 MED ORDER — ONDANSETRON HCL 4 MG/2ML IJ SOLN
INTRAMUSCULAR | Status: DC | PRN
  Administered 2023-01-31: 4 mg via INTRAVENOUS

## 2023-01-31 MED ORDER — INSULIN ASPART 100 UNIT/ML IJ SOLN
0.0000 [IU] | INTRAMUSCULAR | Status: DC | PRN
  Filled 2023-01-31: qty 0.07

## 2023-01-31 MED ORDER — SODIUM CHLORIDE 0.9 % IV SOLN: INTRAVENOUS | Status: DC | PRN

## 2023-01-31 MED ORDER — CHLORHEXIDINE GLUCONATE 0.12 % MT SOLN: 15.0000 mL | Freq: Once | OROMUCOSAL | Status: AC

## 2023-01-31 MED ORDER — EPINEPHRINE 1 MG/10ML IJ SOSY
PREFILLED_SYRINGE | INTRAMUSCULAR | Status: AC
  Filled 2023-01-31: qty 10

## 2023-01-31 SURGICAL SUPPLY — 28 items
BRUSH CYTOL CELLEBRITY 1.5X140 (MISCELLANEOUS) IMPLANT
CANISTER SUCT 3000ML PPV (MISCELLANEOUS) ×4 IMPLANT
CONT SPEC 4OZ CLIKSEAL STRL BL (MISCELLANEOUS) ×4 IMPLANT
COVER BACK TABLE 60X90IN (DRAPES) ×4 IMPLANT
COVER DOME SNAP 22 D (MISCELLANEOUS) ×4 IMPLANT
FORCEPS BIOP RJ4 1.8 (CUTTING FORCEPS) IMPLANT
GAUZE SPONGE 4X4 12PLY STRL (GAUZE/BANDAGES/DRESSINGS) ×4 IMPLANT
GLOVE BIO SURGEON STRL SZ7.5 (GLOVE) ×4 IMPLANT
GOWN STRL REUS W/ TWL LRG LVL3 (GOWN DISPOSABLE) ×4 IMPLANT
KIT CLEAN ENDO COMPLIANCE (KITS) ×8 IMPLANT
KIT TURNOVER KIT B (KITS) ×4 IMPLANT
MARKER SKIN DUAL TIP RULER LAB (MISCELLANEOUS) ×4 IMPLANT
NDL EBUS SONO TIP PENTAX (NEEDLE) ×3 IMPLANT
NEEDLE EBUS SONO TIP PENTAX (NEEDLE) ×3
NS IRRIG 1000ML POUR BTL (IV SOLUTION) ×4 IMPLANT
OIL SILICONE PENTAX (PARTS (SERVICE/REPAIRS)) ×4 IMPLANT
PAD ARMBOARD 7.5X6 YLW CONV (MISCELLANEOUS) ×8 IMPLANT
SOL ANTI FOG 6CC (MISCELLANEOUS) ×4 IMPLANT
SYR 20CC LL (SYRINGE) ×8 IMPLANT
SYR 20ML ECCENTRIC (SYRINGE) ×8 IMPLANT
SYR 50ML SLIP (SYRINGE) IMPLANT
SYR 5ML LUER SLIP (SYRINGE) ×4 IMPLANT
TOWEL OR 17X24 6PK STRL BLUE (TOWEL DISPOSABLE) ×4 IMPLANT
TRAP SPECIMEN MUCOUS 40CC (MISCELLANEOUS) IMPLANT
TUBE CONNECTING 20X1/4 (TUBING) ×8 IMPLANT
UNDERPAD 30X30 (UNDERPADS AND DIAPERS) ×4 IMPLANT
VALVE DISPOSABLE (MISCELLANEOUS) ×4 IMPLANT
WATER STERILE IRR 1000ML POUR (IV SOLUTION) ×4 IMPLANT

## 2023-01-31 NOTE — Anesthesia Procedure Notes (Signed)
 Procedure Name: Intubation Date/Time: 01/31/2023 1:17 PM  Performed by: Hedy Jarred, CRNAPre-anesthesia Checklist: Patient identified, Emergency Drugs available, Suction available and Patient being monitored Patient Re-evaluated:Patient Re-evaluated prior to induction Oxygen Delivery Method: Circle System Utilized Preoxygenation: Pre-oxygenation with 100% oxygen Induction Type: IV induction Ventilation: Mask ventilation without difficulty Laryngoscope Size: Miller and 2 Tube type: Oral Tube size: 8.5 mm Number of attempts: 1 Airway Equipment and Method: Stylet and Oral airway Placement Confirmation: ETT inserted through vocal cords under direct vision, positive ETCO2 and breath sounds checked- equal and bilateral Secured at: 23 cm Tube secured with: Tape Dental Injury: Teeth and Oropharynx as per pre-operative assessment

## 2023-01-31 NOTE — Transfer of Care (Signed)
 Immediate Anesthesia Transfer of Care Note  Patient: DEMECO DUCKSWORTH  Procedure(s) Performed: ROBOTIC ASSISTED NAVIGATIONAL BRONCHOSCOPY (Left) HEMOSTASIS CONTROL BRONCHIAL BRUSHINGS BRONCHIAL BIOPSIES BRONCHIAL NEEDLE ASPIRATION BIOPSIES  Patient Location: PACU  Anesthesia Type:General  Level of Consciousness: awake, alert , and oriented  Airway & Oxygen Therapy: Patient Spontanous Breathing and Patient connected to nasal cannula oxygen  Post-op Assessment: Report given to RN and Post -op Vital signs reviewed and stable  Post vital signs: Reviewed and stable  Last Vitals:  Vitals Value Taken Time  BP 91/51 01/31/23 1430  Temp 36.6 C 01/31/23 1415  Pulse 72 01/31/23 1437  Resp 20 01/31/23 1437  SpO2 92 % 01/31/23 1437  Vitals shown include unfiled device data.  Last Pain:  Vitals:   01/31/23 1120  TempSrc:   PainSc: 0-No pain         Complications: No notable events documented.

## 2023-01-31 NOTE — Interval H&P Note (Signed)
 History and Physical Interval Note:  01/31/2023 12:58 PM  [name]  has presented today for surgery, with the diagnosis of Lung mass.  The various methods of treatment have been discussed with the patient and family. After consideration of risks, benefits and other options for treatment, the patient has consented to  Procedure(s): ROBOTIC ASSISTED NAVIGATIONAL BRONCHOSCOPY (Left) VIDEO BRONCHOSCOPY WITH ENDOBRONCHIAL ULTRASOUND (Bilateral) as a surgical intervention.  The patient's history has been reviewed, patient examined, no change in status, stable for surgery.  I have reviewed the patient's chart and labs.  Questions were answered to the patient's satisfaction.     Adine CROME Yarisa Lynam

## 2023-01-31 NOTE — Op Note (Signed)
 Video Bronchoscopy with Robotic Assisted Bronchoscopic Navigation   Date of Operation: 01/31/2023   Pre-op Diagnosis: lung nodule   Post-op Diagnosis: lung nodule   Surgeon: Adine LITTIE Gift, DO   Assistants: None   Anesthesia: General endotracheal anesthesia  Operation: Flexible video fiberoptic bronchoscopy with robotic assistance and biopsies.  Estimated Blood Loss: Minimal  Complications: None  Indications and History: [name] is a 73 y.o. male with history of lung nodule. The risks, benefits, complications, treatment options and expected outcomes were discussed with the patient.  The possibilities of pneumothorax, pneumonia, reaction to medication, pulmonary aspiration, perforation of a viscus, bleeding, failure to diagnose a condition and creating a complication requiring transfusion or operation were discussed with the patient who freely signed the consent.    Description of Procedure: The patient was seen in the Preoperative Area, was examined and was deemed appropriate to proceed.  The patient was taken to [facility] endoscopy room 3, identified as [name] and the procedure verified as Flexible Video Fiberoptic Bronchoscopy.  A Time Out was held and the above information confirmed.   Prior to the date of the procedure a high-resolution CT scan of the chest was performed. Utilizing ION software program a virtual tracheobronchial tree was generated to allow the creation of distinct navigation pathways to the patient's parenchymal abnormalities. After being taken to the operating room general anesthesia was initiated and the patient  was orally intubated. The video fiberoptic bronchoscope was introduced via the endotracheal tube and a general inspection was performed which showed normal right and left lung anatomy, aspiration of the bilateral mainstems was completed to remove any remaining secretions. Robotic catheter inserted into patient's endotracheal tube.   Target #1  left upper lobe: The distinct navigation pathways prepared prior to this procedure were then utilized to navigate to patient's lesion identified on CT scan. The robotic catheter was secured into place and the vision probe was withdrawn.  Lesion location was approximated using fluoroscopy for peripheral targeting. Under fluoroscopic guidance transbronchial needle brushings, transbronchial needle biopsies, and transbronchial forceps biopsies were performed to be sent for cytology and pathology.  There was a minimal amount of bleeding following the procedure.  Required I saline and 1: 10,000 dilution of epinephrine  to help with hemostasis.  Clot was suctioned from the airway.  At the end of the procedure a general airway inspection was performed and there was no evidence of active bleeding. The bronchoscope was removed.  The patient tolerated the procedure well. There was no significant blood loss and there were no obvious complications. A post-procedural chest x-ray is pending.  Samples Target #1: 1. Transbronchial needle brushings from left upper lobe 2. Transbronchial Wang needle biopsies from left upper lobe 3. Transbronchial forceps biopsies from left upper lobe  Plans:  The patient will be discharged from the PACU to home when recovered from anesthesia and after chest x-ray is reviewed. We will review the cytology, pathology results with the patient when they become available. Outpatient followup will be with Adine LITTIE Gift, DO.  Adine LITTIE Gift, DO [facility] 01/31/2023 2:25 PM

## 2023-01-31 NOTE — Discharge Instructions (Signed)
Flexible Bronchoscopy, Care After This sheet gives you information about how to care for yourself after your test. Your doctor may also give you more specific instructions. If you have problems or questions, contact your doctor. Follow these instructions at home: Eating and drinking Do not eat or drink anything (not even water) for 2 hours after your test, or until your numbing medicine (local anesthetic) wears off. When your numbness is gone and your cough and gag reflexes have come back, you may: Eat only soft foods. Slowly drink liquids. The day after the test, go back to your normal diet. Driving Do not drive for 24 hours if you were given a medicine to help you relax (sedative). Do not drive or use heavy machinery while taking prescription pain medicine. General instructions  Take over-the-counter and prescription medicines only as told by your doctor. Return to your normal activities as told. Ask what activities are safe for you. Do not use any products that have nicotine or tobacco in them. This includes cigarettes and e-cigarettes. If you need help quitting, ask your doctor. Keep all follow-up visits as told by your doctor. This is important. It is very important if you had a tissue sample (biopsy) taken. Get help right away if: You have shortness of breath that gets worse. You get light-headed. You feel like you are going to pass out (faint). You have chest pain. You cough up: More than a little blood. More blood than before. Summary Do not eat or drink anything (not even water) for 2 hours after your test, or until your numbing medicine wears off. Do not use cigarettes. Do not use e-cigarettes. Get help right away if you have chest pain.  This information is not intended to replace advice given to you by your health care provider. Make sure you discuss any questions you have with your health care provider. Document Released: 10/31/2008 Document Revised: 12/16/2016 Document  Reviewed: 01/22/2016 Elsevier Patient Education  2020 [employer]  

## 2023-01-31 NOTE — Anesthesia Postprocedure Evaluation (Signed)
 Anesthesia Post Note  Patient: [name]  Procedure(s) Performed: ROBOTIC ASSISTED NAVIGATIONAL BRONCHOSCOPY (Left) HEMOSTASIS CONTROL BRONCHIAL BRUSHINGS BRONCHIAL BIOPSIES BRONCHIAL NEEDLE ASPIRATION BIOPSIES     Patient location during evaluation: PACU Anesthesia Type: General Level of consciousness: awake and alert Pain management: pain level controlled Vital Signs Assessment: post-procedure vital signs reviewed and stable Respiratory status: spontaneous breathing, nonlabored ventilation, respiratory function stable and patient connected to nasal cannula oxygen Cardiovascular status: blood pressure returned to baseline and stable Postop Assessment: no apparent nausea or vomiting Anesthetic complications: no   No notable events documented.  Last Vitals:  Vitals:   01/31/23 1530 01/31/23 1545  BP: 139/78 125/79  Pulse: 68 74  Resp: 16 19  Temp:  36.7 C  SpO2: 100% 100%    Last Pain:  Vitals:   01/31/23 1515  TempSrc:   PainSc: 0-No pain                 Garnette DELENA Gab

## 2023-02-01 DIAGNOSIS — H5213 Myopia, bilateral: Secondary | ICD-10-CM | POA: Diagnosis not present

## 2023-02-02 ENCOUNTER — Encounter (HOSPITAL_COMMUNITY): Payer: Self-pay | Admitting: Pulmonary Disease

## 2023-02-02 LAB — CYTOLOGY - NON PAP

## 2023-02-03 ENCOUNTER — Ambulatory Visit: Payer: Medicare HMO | Admitting: Acute Care

## 2023-02-08 ENCOUNTER — Ambulatory Visit: Payer: Medicare HMO | Admitting: Acute Care

## 2023-02-09 ENCOUNTER — Other Ambulatory Visit: Payer: Self-pay

## 2023-02-10 NOTE — Progress Notes (Signed)
The proposed treatment discussed in conference is for discussion purpose only and is not a binding recommendation.  The patients have not been physically examined, or presented with their treatment options.  Therefore, final treatment plans cannot be decided.

## 2023-02-14 ENCOUNTER — Ambulatory Visit: Payer: Medicare HMO | Admitting: Acute Care

## 2023-02-14 ENCOUNTER — Telehealth: Payer: Self-pay

## 2023-02-14 NOTE — Telephone Encounter (Signed)
Oxycodone-Acetaminophen 5/325 MG  Qty 25 Tablets ? ?PATIENT USES [facility] ON [address] ?

## 2023-02-15 ENCOUNTER — Ambulatory Visit: Payer: Medicare HMO | Admitting: Acute Care

## 2023-02-15 MED ORDER — OXYCODONE-ACETAMINOPHEN 5-325 MG PO TABS: ORAL_TABLET | ORAL | 0 refills | Status: DC

## 2023-02-16 ENCOUNTER — Telehealth: Payer: Self-pay | Admitting: Radiation Oncology

## 2023-02-16 ENCOUNTER — Ambulatory Visit (INDEPENDENT_AMBULATORY_CARE_PROVIDER_SITE_OTHER): Payer: Medicare HMO | Admitting: Acute Care

## 2023-02-16 VITALS — BP 114/72 | HR 70 | Ht 70.0 in | Wt 151.6 lb

## 2023-02-16 DIAGNOSIS — Z87891 Personal history of nicotine dependence: Secondary | ICD-10-CM | POA: Diagnosis not present

## 2023-02-16 DIAGNOSIS — C3490 Malignant neoplasm of unspecified part of unspecified bronchus or lung: Secondary | ICD-10-CM

## 2023-02-16 DIAGNOSIS — Z9889 Other specified postprocedural states: Secondary | ICD-10-CM | POA: Diagnosis not present

## 2023-02-16 NOTE — Patient Instructions (Addendum)
It is good to see you today. I am glad you have done well after the procedure.  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 I have referred you to Thoracic Surgery and radiation oncology to discuss treatment options. You will get calls to get these scheduled.  I have also scheduled an MRI of the brain for staging You will get a call to get this scheduled.  Follow up as needed.  Please contact office for sooner follow up if symptoms do not improve or worsen or seek emergency care

## 2023-02-16 NOTE — Telephone Encounter (Signed)
Spoke to pt who asked I call tomorrow at 10am.
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
## 2023-02-17 ENCOUNTER — Telehealth: Payer: Self-pay | Admitting: Radiation Oncology

## 2023-02-17 NOTE — Telephone Encounter (Signed)
Spoke to pt to schedule CON. Pt states his friend will bring pt to appt.

## 2023-02-19 ENCOUNTER — Encounter: Payer: Self-pay | Admitting: Acute Care

## 2023-02-20 DIAGNOSIS — C3412 Malignant neoplasm of upper lobe, left bronchus or lung: Secondary | ICD-10-CM | POA: Insufficient documentation
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
## 2023-02-20 NOTE — Progress Notes (Signed)
 Thoracic Location of Tumor / Histology: Left Upper Lobe Lung  Patient has been followed in the pulmonary clinic for a left upper lobe lung nodule discovered in April 2024.SABRA   MRI Brain: Unscheduled at this time   PET 11/24/2022: 4.1 cm medial left upper lobe mass, compatible with primary bronchogenic carcinoma.   Biopsies of Left Upper Lobe Lung Mass 01/31/2023    Past/Anticipated interventions by cardiothoracic surgery, if any:  Dr. Shyrl 02/24/2023   Past/Anticipated interventions by medical oncology, if any:    Tobacco/Marijuana/Snuff/ETOH use: Former smoker, quit 2017.  Signs/Symptoms Weight changes, if any: Denies weight changes. Respiratory complaints, if any: No Hemoptysis, if any: Occasional productive cough with clear phlegm. Pain issues, if any: Denies chest pain, pressure, or tightness.   SAFETY ISSUES: Prior radiation? No Pacemaker/ICD? No  Possible current pregnancy? N/a Is the patient on methotrexate? No  Current Complaints / other details:

## 2023-02-21 ENCOUNTER — Ambulatory Visit
Admission: RE | Admit: 2023-02-21 | Discharge: 2023-02-21 | Discharge status: Home / Self Care | Payer: Medicare HMO | Source: Ambulatory Visit | Attending: Radiation Oncology | Admitting: Radiation Oncology

## 2023-02-21 ENCOUNTER — Encounter: Payer: Self-pay | Admitting: Radiation Oncology

## 2023-02-21 ENCOUNTER — Ambulatory Visit
Admission: RE | Admit: 2023-02-21 | Discharge: 2023-02-21 | Disposition: A | Discharge status: Home / Self Care | Payer: Medicare HMO | Source: Ambulatory Visit | Attending: Radiation Oncology | Admitting: Radiation Oncology

## 2023-02-21 VITALS — BP 129/69 | HR 72 | Temp 96.9°F | Resp 18 | Ht 70.0 in | Wt 150.4 lb

## 2023-02-21 DIAGNOSIS — Z79899 Other long term (current) drug therapy: Secondary | ICD-10-CM | POA: Insufficient documentation

## 2023-02-21 DIAGNOSIS — Z7982 Long term (current) use of aspirin: Secondary | ICD-10-CM | POA: Insufficient documentation

## 2023-02-21 DIAGNOSIS — Z7984 Long term (current) use of oral hypoglycemic drugs: Secondary | ICD-10-CM | POA: Insufficient documentation

## 2023-02-21 DIAGNOSIS — E119 Type 2 diabetes mellitus without complications: Secondary | ICD-10-CM | POA: Insufficient documentation

## 2023-02-21 DIAGNOSIS — Z89612 Acquired absence of left leg above knee: Secondary | ICD-10-CM | POA: Insufficient documentation

## 2023-02-21 DIAGNOSIS — Z794 Long term (current) use of insulin: Secondary | ICD-10-CM | POA: Diagnosis not present

## 2023-02-21 DIAGNOSIS — Z87891 Personal history of nicotine dependence: Secondary | ICD-10-CM | POA: Diagnosis not present

## 2023-02-21 DIAGNOSIS — E785 Hyperlipidemia, unspecified: Secondary | ICD-10-CM | POA: Insufficient documentation

## 2023-02-21 DIAGNOSIS — R011 Cardiac murmur, unspecified: Secondary | ICD-10-CM | POA: Diagnosis not present

## 2023-02-21 DIAGNOSIS — I10 Essential (primary) hypertension: Secondary | ICD-10-CM | POA: Diagnosis not present

## 2023-02-21 DIAGNOSIS — I714 Abdominal aortic aneurysm, without rupture, unspecified: Secondary | ICD-10-CM | POA: Insufficient documentation

## 2023-02-21 DIAGNOSIS — I252 Old myocardial infarction: Secondary | ICD-10-CM | POA: Diagnosis not present

## 2023-02-21 DIAGNOSIS — C3412 Malignant neoplasm of upper lobe, left bronchus or lung: Secondary | ICD-10-CM

## 2023-02-21 DIAGNOSIS — I2510 Atherosclerotic heart disease of native coronary artery without angina pectoris: Secondary | ICD-10-CM | POA: Insufficient documentation

## 2023-02-23 ENCOUNTER — Inpatient Hospital Stay: Payer: Medicare HMO | Attending: Radiation Oncology | Admitting: Licensed Clinical Social Worker

## 2023-02-23 DIAGNOSIS — C3412 Malignant neoplasm of upper lobe, left bronchus or lung: Secondary | ICD-10-CM
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
## 2023-02-24 ENCOUNTER — Institutional Professional Consult (permissible substitution) (INDEPENDENT_AMBULATORY_CARE_PROVIDER_SITE_OTHER): Payer: Medicare HMO | Admitting: Thoracic Surgery (Cardiothoracic Vascular Surgery)

## 2023-02-24 ENCOUNTER — Encounter: Payer: Self-pay | Admitting: Thoracic Surgery (Cardiothoracic Vascular Surgery)

## 2023-02-24 VITALS — BP 117/71 | HR 83 | Resp 20 | Ht 70.0 in | Wt 148.7 lb

## 2023-02-24 DIAGNOSIS — C3412 Malignant neoplasm of upper lobe, left bronchus or lung: Secondary | ICD-10-CM

## 2023-02-27 NOTE — Progress Notes (Signed)
 [facility] Clinical Social Work  Initial Assessment   COBY SHREWSBERRY is a 73 y.o. year old male contacted by phone. Clinical Social Work was referred by medical provider for assessment of psychosocial needs.   SDOH (Social Determinants of Health) assessments performed: Yes SDOH Interventions    Flowsheet Row Telephone from 12/13/2021 in [facility] Telephone from 08/17/2020 in [facility] Telephone from 12/10/2019 in [facility] [employer] [facility]  SDOH Interventions     Food Insecurity Interventions Other (Comment)  [pt recieves monthly assistance from [facility] Medicare] Intervention Not Indicated  [recieves $220/mo from [facility] Medicare towards food] Intervention Not Indicated  Housing Interventions Intervention Not Indicated Intervention Not Indicated Intervention Not Indicated  Transportation Interventions Intervention Not Indicated Intervention Not Indicated Intervention Not Indicated  Utilities Interventions Other (Comment)  [pt called for financial assistance for gas- does have income that he can use to pay until he applies for [employer]AP on Friday December 1st] -- --  Financial Strain Interventions Intervention Not Indicated Other (Comment)  [spoke with Debby Raylene Frizzle regarding cost,  referral to Patient Care Fund] --       SDOH Screenings   Food Insecurity: No Food Insecurity (02/21/2023)  Housing: Unknown (02/21/2023)  Transportation Needs: No Transportation Needs (02/21/2023)  Utilities: At Risk (02/21/2023)  Depression (PHQ2-9): Low Risk  (02/21/2023)  Financial Resource Strain: Medium Risk (12/13/2021)  Tobacco Use: Medium Risk (02/24/2023)     Distress Screen completed: No     No data to display            Family/Social Information:  Housing Arrangement: patient lives alone Family members/support persons in your life? Pt states he has 2 sisters who reside locally and a couple of friends, but his support is limited. Transportation concerns:  yes, pt may have transportation benefits through his insurance.  Pt referred to transportation department to assist w/ exploring those options.  Employment: Retired .  Income source: Actor concerns:  Pt lives on a fixed income and may have financial concerns if he incurs unexpected bills Type of concern: None Food access concerns: no, pt receives $225 a month from [employer] to be used for groceries Religious or spiritual practice: Not known Advanced directives: No Services Currently in place:  none  Coping/ Adjustment to diagnosis: Patient understands treatment plan and what happens next? yes Concerns about diagnosis and/or treatment: Overwhelmed by information and How will I care for myself Patient reported stressors: Adjusting to my illness Hopes and/or priorities: pt's priority is to start treatment w/ the hope of positive results Patient enjoys  not addressed Current coping skills/ strengths: Motivation for treatment/growth  and Physical Health     SUMMARY: Current SDOH Barriers:  Financial constraints related to fixed income  Clinical Social Work Clinical Goal(s):  Explore community resource options for unmet needs related to:  Insurance Risk Surveyor Strain   Interventions: Discussed common feeling and emotions when being diagnosed with cancer, and the importance of support during treatment Informed patient of the support team roles and support services at [facility] Provided CSW contact information and encouraged patient to call with any questions or concerns Referred patient to transportation department   Follow Up Plan: Patient will contact CSW with any support or resource needs Patient verbalizes understanding of plan: Yes    Devere JONELLE Manna, LCSW Clinical Social Worker [facility]

## 2023-03-01 ENCOUNTER — Telehealth: Payer: Self-pay | Admitting: Radiation Oncology

## 2023-03-01 NOTE — Telephone Encounter (Signed)
I called and spoke with the aptietn to review that Dr. Cliffton Asters did not recommend surgery due to his lung function from PFT testing. Rather, the patient remains a candidate for ablative radiation with either SBRT or Ultra Hypofractionated course, either 5-8 fractions pending simulation and dosimetry planning.  We discussed the risks, benefits, short, and long term effects of radiotherapy, as well as the curative intent, and the patient is interested in proceeding. I will ask our staff to contact him to schedule simulation and we will sign written consent at that time to proceed.

## 2023-03-02 ENCOUNTER — Ambulatory Visit (HOSPITAL_COMMUNITY)
Admission: RE | Admit: 2023-03-02 | Discharge: 2023-03-02 | Disposition: A | Discharge status: Home / Self Care | Payer: Medicare HMO | Source: Ambulatory Visit | Attending: Acute Care | Admitting: Acute Care

## 2023-03-02 DIAGNOSIS — C3490 Malignant neoplasm of unspecified part of unspecified bronchus or lung: Secondary | ICD-10-CM | POA: Diagnosis not present

## 2023-03-02 DIAGNOSIS — R9089 Other abnormal findings on diagnostic imaging of central nervous system: Secondary | ICD-10-CM | POA: Diagnosis not present

## 2023-03-02 MED ORDER — GADOBUTROL 1 MMOL/ML IV SOLN
6.8000 mL | Freq: Once | INTRAVENOUS | Status: AC | PRN
  Administered 2023-03-02: 6.8 mL via INTRAVENOUS

## 2023-03-10 ENCOUNTER — Encounter: Payer: Self-pay | Admitting: Internal Medicine

## 2023-03-10 ENCOUNTER — Ambulatory Visit: Payer: Medicare HMO | Attending: Internal Medicine | Admitting: Internal Medicine

## 2023-03-10 ENCOUNTER — Ambulatory Visit: Payer: Medicare HMO | Admitting: Internal Medicine

## 2023-03-10 VITALS — BP 122/76 | HR 70 | Ht 70.0 in | Wt 149.0 lb

## 2023-03-10 DIAGNOSIS — I2510 Atherosclerotic heart disease of native coronary artery without angina pectoris: Secondary | ICD-10-CM | POA: Diagnosis not present

## 2023-03-10 DIAGNOSIS — Z952 Presence of prosthetic heart valve: Secondary | ICD-10-CM | POA: Diagnosis not present

## 2023-03-10 DIAGNOSIS — E782 Mixed hyperlipidemia: Secondary | ICD-10-CM

## 2023-03-10 NOTE — Patient Instructions (Signed)
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
## 2023-03-12 DIAGNOSIS — C3412 Malignant neoplasm of upper lobe, left bronchus or lung: Secondary | ICD-10-CM | POA: Diagnosis not present

## 2023-03-12 DIAGNOSIS — Z87891 Personal history of nicotine dependence: Secondary | ICD-10-CM | POA: Diagnosis not present

## 2023-03-13 ENCOUNTER — Ambulatory Visit
Admission: RE | Admit: 2023-03-13 | Discharge: 2023-03-13 | Disposition: A | Discharge status: Home / Self Care | Payer: Medicare HMO | Source: Ambulatory Visit | Attending: Radiation Oncology | Admitting: Radiation Oncology

## 2023-03-13 DIAGNOSIS — Z87891 Personal history of nicotine dependence: Secondary | ICD-10-CM | POA: Insufficient documentation

## 2023-03-13 DIAGNOSIS — C3412 Malignant neoplasm of upper lobe, left bronchus or lung: Secondary | ICD-10-CM | POA: Diagnosis not present

## 2023-03-14 ENCOUNTER — Telehealth: Payer: Self-pay | Admitting: Orthopaedic Surgery

## 2023-03-14 MED ORDER — OXYCODONE-ACETAMINOPHEN 5-325 MG PO TABS
ORAL_TABLET | ORAL | 0 refills | Status: DC
Start: 2023-03-14 — End: 2023-04-06

## 2023-03-14 NOTE — Telephone Encounter (Signed)
 Dr. Hilda Lias     Patient request refill on his pain medicine   oxyCODONE-acetaminophen (PERCOCET/ROXICET) 5-325 MG tablet   Pharmacy: [facility] on [address]

## 2023-03-22 ENCOUNTER — Telehealth: Payer: Self-pay | Admitting: Radiation Oncology

## 2023-03-22 ENCOUNTER — Ambulatory Visit: Payer: Medicare HMO | Admitting: Radiation Oncology

## 2023-03-22 NOTE — Telephone Encounter (Signed)
 The patient's treatment planning was originally for possible SBRT versus UHFX radiation. After simulation and working with dosimetry, the treatment plan needed to be changed from 5 fxns SBRt to 8 fxns UHFX. The patient was unaware of the change in his plan until this morning which would have been his first treatment. He was frustated and the staff let us know when they called to inform of this change. I answered questions and concerns he had related to this and at the conclusion of the call is comfortable moving forward and requests a new copy of his treatment schedule. I asked our team to mail him a copy since he does not use mychart and to call him back today at the time he requested to review his schedule to coordinate his transportation for his treatment next week.

## 2023-03-23 ENCOUNTER — Ambulatory Visit: Payer: Medicare HMO | Admitting: Radiation Oncology

## 2023-03-23 ENCOUNTER — Ambulatory Visit

## 2023-03-24 ENCOUNTER — Ambulatory Visit
Admission: RE | Admit: 2023-03-24 | Discharge: 2023-03-24 | Disposition: A | Discharge status: Home / Self Care | Payer: Medicare HMO | Source: Ambulatory Visit | Attending: Radiation Oncology | Admitting: Radiation Oncology

## 2023-03-24 ENCOUNTER — Ambulatory Visit: Payer: Medicare HMO

## 2023-03-24 DIAGNOSIS — C3412 Malignant neoplasm of upper lobe, left bronchus or lung: Secondary | ICD-10-CM | POA: Diagnosis not present

## 2023-03-24 DIAGNOSIS — Z87891 Personal history of nicotine dependence: Secondary | ICD-10-CM | POA: Diagnosis not present

## 2023-03-27 ENCOUNTER — Other Ambulatory Visit: Payer: Self-pay

## 2023-03-27 DIAGNOSIS — C3412 Malignant neoplasm of upper lobe, left bronchus or lung: Secondary | ICD-10-CM | POA: Diagnosis not present

## 2023-03-27 DIAGNOSIS — Z87891 Personal history of nicotine dependence: Secondary | ICD-10-CM | POA: Diagnosis not present

## 2023-03-27 DIAGNOSIS — Z510 Encounter for antineoplastic radiation therapy: Secondary | ICD-10-CM | POA: Diagnosis not present

## 2023-03-27 LAB — RAD ONC ARIA SESSION SUMMARY
Course Elapsed Days: 0
Plan Fractions Treated to Date: 1
Plan Prescribed Dose Per Fraction: 7.5 Gy
Plan Total Fractions Prescribed: 8
Plan Total Prescribed Dose: 60 Gy
Reference Point Dosage Given to Date: 7.5 Gy
Reference Point Session Dosage Given: 7.5 Gy
Session Number: 1

## 2023-03-28 ENCOUNTER — Other Ambulatory Visit: Payer: Self-pay

## 2023-03-28 ENCOUNTER — Ambulatory Visit: Payer: Medicare HMO | Admitting: Radiation Oncology

## 2023-03-28 ENCOUNTER — Ambulatory Visit
Admission: RE | Admit: 2023-03-28 | Discharge: 2023-03-28 | Disposition: A | Discharge status: Home / Self Care | Source: Ambulatory Visit | Attending: Radiation Oncology | Admitting: Radiation Oncology

## 2023-03-28 DIAGNOSIS — Z87891 Personal history of nicotine dependence: Secondary | ICD-10-CM | POA: Diagnosis not present

## 2023-03-28 DIAGNOSIS — C3412 Malignant neoplasm of upper lobe, left bronchus or lung: Secondary | ICD-10-CM | POA: Diagnosis not present

## 2023-03-28 DIAGNOSIS — Z510 Encounter for antineoplastic radiation therapy: Secondary | ICD-10-CM | POA: Diagnosis not present

## 2023-03-28 LAB — RAD ONC ARIA SESSION SUMMARY
Course Elapsed Days: 1
Plan Fractions Treated to Date: 2
Plan Prescribed Dose Per Fraction: 7.5 Gy
Plan Total Fractions Prescribed: 8
Plan Total Prescribed Dose: 60 Gy
Reference Point Dosage Given to Date: 15 Gy
Reference Point Session Dosage Given: 7.5 Gy
Session Number: 2

## 2023-03-29 ENCOUNTER — Ambulatory Visit
Admission: RE | Admit: 2023-03-29 | Discharge: 2023-03-29 | Disposition: A | Discharge status: Home / Self Care | Payer: Medicare HMO | Source: Ambulatory Visit | Attending: Radiation Oncology | Admitting: Radiation Oncology

## 2023-03-29 ENCOUNTER — Other Ambulatory Visit: Payer: Self-pay

## 2023-03-29 DIAGNOSIS — Z510 Encounter for antineoplastic radiation therapy: Secondary | ICD-10-CM | POA: Diagnosis not present

## 2023-03-29 DIAGNOSIS — C3412 Malignant neoplasm of upper lobe, left bronchus or lung: Secondary | ICD-10-CM | POA: Diagnosis not present

## 2023-03-29 DIAGNOSIS — Z87891 Personal history of nicotine dependence: Secondary | ICD-10-CM | POA: Diagnosis not present

## 2023-03-29 LAB — RAD ONC ARIA SESSION SUMMARY
Course Elapsed Days: 2
Plan Fractions Treated to Date: 3
Plan Prescribed Dose Per Fraction: 7.5 Gy
Plan Total Fractions Prescribed: 8
Plan Total Prescribed Dose: 60 Gy
Reference Point Dosage Given to Date: 22.5 Gy
Reference Point Session Dosage Given: 7.5 Gy
Session Number: 3

## 2023-03-30 ENCOUNTER — Other Ambulatory Visit: Payer: Self-pay

## 2023-03-30 ENCOUNTER — Ambulatory Visit
Admission: RE | Admit: 2023-03-30 | Discharge: 2023-03-30 | Disposition: A | Discharge status: Home / Self Care | Source: Ambulatory Visit | Attending: Radiation Oncology | Admitting: Radiation Oncology

## 2023-03-30 DIAGNOSIS — Z87891 Personal history of nicotine dependence: Secondary | ICD-10-CM | POA: Diagnosis not present

## 2023-03-30 DIAGNOSIS — Z510 Encounter for antineoplastic radiation therapy: Secondary | ICD-10-CM | POA: Diagnosis not present

## 2023-03-30 DIAGNOSIS — C3412 Malignant neoplasm of upper lobe, left bronchus or lung: Secondary | ICD-10-CM | POA: Diagnosis not present

## 2023-03-30 LAB — RAD ONC ARIA SESSION SUMMARY
Course Elapsed Days: 3
Plan Fractions Treated to Date: 4
Plan Prescribed Dose Per Fraction: 7.5 Gy
Plan Total Fractions Prescribed: 8
Plan Total Prescribed Dose: 60 Gy
Reference Point Dosage Given to Date: 30 Gy
Reference Point Session Dosage Given: 7.5 Gy
Session Number: 4

## 2023-03-31 ENCOUNTER — Ambulatory Visit
Admission: RE | Admit: 2023-03-31 | Discharge: 2023-03-31 | Disposition: A | Discharge status: Home / Self Care | Payer: Medicare HMO | Source: Ambulatory Visit | Attending: Radiation Oncology | Admitting: Radiation Oncology

## 2023-03-31 ENCOUNTER — Ambulatory Visit
Admission: RE | Admit: 2023-03-31 | Discharge: 2023-03-31 | Discharge status: Home / Self Care | Source: Ambulatory Visit | Attending: Radiation Oncology

## 2023-03-31 ENCOUNTER — Other Ambulatory Visit: Payer: Self-pay

## 2023-03-31 DIAGNOSIS — C3412 Malignant neoplasm of upper lobe, left bronchus or lung: Secondary | ICD-10-CM | POA: Diagnosis not present

## 2023-03-31 DIAGNOSIS — Z510 Encounter for antineoplastic radiation therapy: Secondary | ICD-10-CM | POA: Diagnosis not present

## 2023-03-31 DIAGNOSIS — Z87891 Personal history of nicotine dependence: Secondary | ICD-10-CM | POA: Diagnosis not present

## 2023-03-31 LAB — RAD ONC ARIA SESSION SUMMARY
Course Elapsed Days: 4
Plan Fractions Treated to Date: 5
Plan Prescribed Dose Per Fraction: 7.5 Gy
Plan Total Fractions Prescribed: 8
Plan Total Prescribed Dose: 60 Gy
Reference Point Dosage Given to Date: 37.5 Gy
Reference Point Session Dosage Given: 7.5 Gy
Session Number: 5

## 2023-04-03 ENCOUNTER — Other Ambulatory Visit: Payer: Self-pay

## 2023-04-03 ENCOUNTER — Ambulatory Visit
Admission: RE | Admit: 2023-04-03 | Discharge: 2023-04-03 | Disposition: A | Discharge status: Home / Self Care | Source: Ambulatory Visit | Attending: Radiation Oncology | Admitting: Radiation Oncology

## 2023-04-03 DIAGNOSIS — C3412 Malignant neoplasm of upper lobe, left bronchus or lung: Secondary | ICD-10-CM | POA: Diagnosis not present

## 2023-04-03 DIAGNOSIS — Z510 Encounter for antineoplastic radiation therapy: Secondary | ICD-10-CM | POA: Diagnosis not present

## 2023-04-03 DIAGNOSIS — Z87891 Personal history of nicotine dependence: Secondary | ICD-10-CM | POA: Diagnosis not present

## 2023-04-03 LAB — RAD ONC ARIA SESSION SUMMARY
Course Elapsed Days: 7
Plan Fractions Treated to Date: 6
Plan Prescribed Dose Per Fraction: 7.5 Gy
Plan Total Fractions Prescribed: 8
Plan Total Prescribed Dose: 60 Gy
Reference Point Dosage Given to Date: 45 Gy
Reference Point Session Dosage Given: 7.5 Gy
Session Number: 6

## 2023-04-04 ENCOUNTER — Ambulatory Visit
Admission: RE | Admit: 2023-04-04 | Discharge: 2023-04-04 | Disposition: A | Discharge status: Home / Self Care | Source: Ambulatory Visit | Attending: Radiation Oncology | Admitting: Radiation Oncology

## 2023-04-04 ENCOUNTER — Other Ambulatory Visit: Payer: Self-pay

## 2023-04-04 ENCOUNTER — Telehealth: Payer: Self-pay

## 2023-04-04 DIAGNOSIS — Z510 Encounter for antineoplastic radiation therapy: Secondary | ICD-10-CM | POA: Diagnosis not present

## 2023-04-04 DIAGNOSIS — C3412 Malignant neoplasm of upper lobe, left bronchus or lung: Secondary | ICD-10-CM | POA: Diagnosis not present

## 2023-04-04 DIAGNOSIS — Z87891 Personal history of nicotine dependence: Secondary | ICD-10-CM | POA: Diagnosis not present

## 2023-04-04 LAB — RAD ONC ARIA SESSION SUMMARY
Course Elapsed Days: 8
Plan Fractions Treated to Date: 7
Plan Prescribed Dose Per Fraction: 7.5 Gy
Plan Total Fractions Prescribed: 8
Plan Total Prescribed Dose: 60 Gy
Reference Point Dosage Given to Date: 52.5 Gy
Reference Point Session Dosage Given: 7.5 Gy
Session Number: 7

## 2023-04-04 NOTE — Telephone Encounter (Signed)
 Dr. Hilda Lias pt----Oxycodone-Acetaminophen 5/325 MG  Qty 25 Tablets   One tablet every four hours as need for pain, 5 day limit.   PATIENT USES [facility] ON [address]

## 2023-04-05 ENCOUNTER — Ambulatory Visit

## 2023-04-05 ENCOUNTER — Other Ambulatory Visit: Payer: Self-pay

## 2023-04-05 ENCOUNTER — Ambulatory Visit
Admission: RE | Admit: 2023-04-05 | Discharge: 2023-04-05 | Disposition: A | Discharge status: Home / Self Care | Source: Ambulatory Visit | Attending: Radiation Oncology | Admitting: Radiation Oncology

## 2023-04-05 DIAGNOSIS — Z510 Encounter for antineoplastic radiation therapy: Secondary | ICD-10-CM | POA: Diagnosis not present

## 2023-04-05 DIAGNOSIS — Z87891 Personal history of nicotine dependence: Secondary | ICD-10-CM | POA: Diagnosis not present

## 2023-04-05 DIAGNOSIS — C3412 Malignant neoplasm of upper lobe, left bronchus or lung: Secondary | ICD-10-CM

## 2023-04-05 LAB — RAD ONC ARIA SESSION SUMMARY
Course Elapsed Days: 9
Plan Fractions Treated to Date: 8
Plan Prescribed Dose Per Fraction: 7.5 Gy
Plan Total Fractions Prescribed: 8
Plan Total Prescribed Dose: 60 Gy
Reference Point Dosage Given to Date: 60 Gy
Reference Point Session Dosage Given: 7.5 Gy
Session Number: 8

## 2023-04-06 ENCOUNTER — Ambulatory Visit

## 2023-04-06 MED ORDER — OXYCODONE-ACETAMINOPHEN 5-325 MG PO TABS: ORAL_TABLET | ORAL | 0 refills | Status: DC

## 2023-04-06 NOTE — Radiation Completion Notes (Addendum)
  Radiation Oncology         ([phone] ________________________________  Name: [name] MRN: [id number]  Date of Service: 04/05/2023  DOB: 1950/06/12  End of Treatment Note  Diagnosis:  Stage IIA, [id number], NSCLC, Squamous Cell Carcinoma of the LUL  Intent: Curative     ==========DELIVERED PLANS==========  First Treatment Date: 2023-03-27 Last Treatment Date: 2023-04-05   Plan Name: Lung_L_[id number] Site: Lung, Left Technique: IMRT Mode: Photon Dose Per Fraction: 7.5 Gy Prescribed Dose (Delivered / Prescribed): 60 Gy / 60 Gy Prescribed Fxs (Delivered / Prescribed): 8 / 8     ==========ON TREATMENT VISIT DATES========== 2023-03-31, 2023-04-05   See weekly On Treatment Notes in Epic for details in the Media tab (listed as Progress notes on the On Treatment Visit Dates listed above).The patient tolerated radiation and developed fatigue.  The patient will receive a call in about one month from the radiation oncology department. He will continue follow up with in our clinic with repeat CT chest in 6-8 weeks.      Osker [place], PAC

## 2023-04-20 DIAGNOSIS — Z87891 Personal history of nicotine dependence: Secondary | ICD-10-CM | POA: Diagnosis not present

## 2023-04-20 DIAGNOSIS — E1129 Type 2 diabetes mellitus with other diabetic kidney complication: Secondary | ICD-10-CM | POA: Diagnosis not present

## 2023-04-20 DIAGNOSIS — Z79899 Other long term (current) drug therapy: Secondary | ICD-10-CM | POA: Diagnosis not present

## 2023-04-20 DIAGNOSIS — C3490 Malignant neoplasm of unspecified part of unspecified bronchus or lung: Secondary | ICD-10-CM | POA: Diagnosis not present

## 2023-04-20 DIAGNOSIS — I2510 Atherosclerotic heart disease of native coronary artery without angina pectoris: Secondary | ICD-10-CM | POA: Diagnosis not present

## 2023-04-20 DIAGNOSIS — Z125 Encounter for screening for malignant neoplasm of prostate: Secondary | ICD-10-CM | POA: Diagnosis not present

## 2023-04-24 ENCOUNTER — Other Ambulatory Visit: Payer: Self-pay | Admitting: Radiation Oncology

## 2023-04-24 DIAGNOSIS — C3412 Malignant neoplasm of upper lobe, left bronchus or lung: Secondary | ICD-10-CM

## 2023-04-26 ENCOUNTER — Encounter: Payer: Self-pay | Admitting: Orthopaedic Surgery

## 2023-04-26 ENCOUNTER — Ambulatory Visit (INDEPENDENT_AMBULATORY_CARE_PROVIDER_SITE_OTHER): Admitting: Orthopaedic Surgery

## 2023-04-26 DIAGNOSIS — G8929 Other chronic pain: Secondary | ICD-10-CM | POA: Diagnosis not present

## 2023-04-26 DIAGNOSIS — M25511 Pain in right shoulder: Secondary | ICD-10-CM

## 2023-04-26 MED ORDER — OXYCODONE-ACETAMINOPHEN 5-325 MG PO TABS
ORAL_TABLET | ORAL | 0 refills | Status: DC
Start: 2023-04-26 — End: 2023-05-19

## 2023-04-26 MED ORDER — METHYLPREDNISOLONE ACETATE 40 MG/ML IJ SUSP
40.0000 mg | Freq: Once | INTRAMUSCULAR | Status: AC
  Administered 2023-04-26: 40 mg via INTRA_ARTICULAR

## 2023-04-26 NOTE — Progress Notes (Signed)
 PROCEDURE NOTE:  The patient request injection, verbal consent was obtained.  The right shoulder was prepped appropriately after time out was performed.   Sterile technique was observed and injection of 1 cc of DepoMedrol 40mg  with several cc's of plain xylocaine. Anesthesia was provided by ethyl chloride and a 20-gauge needle was used to inject the shoulder area. A posterior approach was used.  The injection was tolerated well.  A band aid dressing was applied.  The patient was advised to apply ice later today and tomorrow to the injection sight as needed.  Encounter Diagnosis  Name Primary?   Chronic right shoulder pain Yes   Return prn.  I have reviewed the [place] Controlled Substance Reporting System web site prior to prescribing narcotic medicine for this patient.  Call if any problem.  Precautions discussed.  Electronically Signed Darreld Mclean, MD 4/9/202510:39 AM

## 2023-04-26 NOTE — Addendum Note (Signed)
 Addended by: Elvina Mattes T on: 04/26/2023 11:41 AM   Modules accepted: Orders

## 2023-04-27 DIAGNOSIS — I10 Essential (primary) hypertension: Secondary | ICD-10-CM | POA: Diagnosis not present

## 2023-04-27 DIAGNOSIS — C3490 Malignant neoplasm of unspecified part of unspecified bronchus or lung: Secondary | ICD-10-CM | POA: Diagnosis not present

## 2023-04-27 DIAGNOSIS — I2510 Atherosclerotic heart disease of native coronary artery without angina pectoris: Secondary | ICD-10-CM | POA: Diagnosis not present

## 2023-04-27 DIAGNOSIS — E1122 Type 2 diabetes mellitus with diabetic chronic kidney disease: Secondary | ICD-10-CM | POA: Diagnosis not present

## 2023-05-03 ENCOUNTER — Ambulatory Visit: Admitting: Orthopaedic Surgery

## 2023-05-18 ENCOUNTER — Telehealth: Payer: Self-pay

## 2023-05-18 NOTE — Telephone Encounter (Signed)
 Oxycodone-Acetaminophen 5/325 MG  Qty 25 Tablets ? ?PATIENT USES [facility] ON [address] ?

## 2023-05-19 MED ORDER — OXYCODONE-ACETAMINOPHEN 5-325 MG PO TABS: ORAL_TABLET | ORAL | 0 refills | Status: DC

## 2023-06-01 ENCOUNTER — Telehealth: Payer: Self-pay | Admitting: *Deleted

## 2023-06-01 NOTE — Telephone Encounter (Signed)
 CALLED PATIENT'S SISTER- ANNIE HARRIS TO INFORM OF HER'S BROTHER CT ON 06-09-23- ARRIVALTIME- 12:15 PM @ [facility], NO RESTRICTIONS TO SCAN, NO ANSWER, MAILED APPT. CARD

## 2023-06-09 ENCOUNTER — Ambulatory Visit (HOSPITAL_COMMUNITY)

## 2023-06-13 ENCOUNTER — Telehealth: Payer: Self-pay | Admitting: Orthopaedic Surgery

## 2023-06-13 MED ORDER — OXYCODONE-ACETAMINOPHEN 5-325 MG PO TABS: ORAL_TABLET | ORAL | 0 refills | Status: DC

## 2023-06-13 NOTE — Telephone Encounter (Signed)
 Dr. Vicente Graham pt - pt lvm on 06/12/23 at 10:51am requesting a refill for Oxycodone  5-325 to be sent to [facility] on [facility].

## 2023-06-26 ENCOUNTER — Telehealth: Payer: Self-pay | Admitting: Orthopaedic Surgery

## 2023-06-26 NOTE — Telephone Encounter (Signed)
 DR. Iline Mallory   Patient came in the office requesting a refill on his pain medicine   oxyCODONE -acetaminophen  (PERCOCET/ROXICET) 5-325 MG tablet   Pharmacy:  [facility] on [address]

## 2023-06-27 MED ORDER — HYDROCODONE-ACETAMINOPHEN 7.5-325 MG PO TABS: 1.0000 | ORAL_TABLET | Freq: Four times a day (QID) | ORAL | 0 refills | Status: AC | PRN

## 2023-06-27 NOTE — Telephone Encounter (Signed)
 Pt is aware of medication change.

## 2023-07-12 ENCOUNTER — Other Ambulatory Visit: Payer: Self-pay | Admitting: Orthopaedic Surgery

## 2023-07-12 MED ORDER — HYDROCODONE-ACETAMINOPHEN 7.5-325 MG PO TABS: 1.0000 | ORAL_TABLET | Freq: Four times a day (QID) | ORAL | 0 refills | Status: DC | PRN

## 2023-07-12 NOTE — Telephone Encounter (Signed)
 DR. BRENNA   Patient called wanting a refill on his pain medicine   HYDROcodone -acetaminophen  (NORCO) 7.5-325 MG tablet   He said he needs to take 2 a day.  I advised him Dr. BRENNA is not here and it is up to the doctor in the office on how many pills he will get   Pharmacy [facility].

## 2023-07-12 NOTE — Telephone Encounter (Signed)
 Dr Brenna wrote him for 1 q6 hours so I pended that If he wants to reduce to two per day that should be fine the lower amount should not be an issue /Dr Onesimo can update to that sig when he gets request, sending to him

## 2023-07-26 DIAGNOSIS — Z79899 Other long term (current) drug therapy: Secondary | ICD-10-CM | POA: Diagnosis not present

## 2023-07-26 DIAGNOSIS — E1129 Type 2 diabetes mellitus with other diabetic kidney complication: Secondary | ICD-10-CM | POA: Diagnosis not present

## 2023-07-26 DIAGNOSIS — E785 Hyperlipidemia, unspecified: Secondary | ICD-10-CM | POA: Diagnosis not present

## 2023-07-31 ENCOUNTER — Telehealth: Payer: Self-pay

## 2023-07-31 NOTE — Telephone Encounter (Signed)
Hydrocodone- Acetaminophen 7.5/325 MG Qty 28 Tablets  ? ?PATIENT USES [facility] ON [address] ?

## 2023-08-02 DIAGNOSIS — E1122 Type 2 diabetes mellitus with diabetic chronic kidney disease: Secondary | ICD-10-CM | POA: Diagnosis not present

## 2023-08-02 DIAGNOSIS — E785 Hyperlipidemia, unspecified: Secondary | ICD-10-CM | POA: Diagnosis not present

## 2023-08-02 DIAGNOSIS — C3490 Malignant neoplasm of unspecified part of unspecified bronchus or lung: Secondary | ICD-10-CM | POA: Diagnosis not present

## 2023-08-02 DIAGNOSIS — I2510 Atherosclerotic heart disease of native coronary artery without angina pectoris: Secondary | ICD-10-CM | POA: Diagnosis not present

## 2023-08-02 NOTE — Telephone Encounter (Signed)
 Dr. Janae pt - pt lvm stating he called for her medication on 07/31/23 and he wanted to see what is going on.  [phone]

## 2023-08-03 NOTE — Telephone Encounter (Signed)
 He hasn't been seen in 3 months needs appointment please for refill

## 2023-08-08 ENCOUNTER — Other Ambulatory Visit: Payer: Self-pay | Admitting: Radiation Oncology

## 2023-08-08 ENCOUNTER — Telehealth: Payer: Self-pay | Admitting: Radiation Oncology

## 2023-08-08 DIAGNOSIS — C3412 Malignant neoplasm of upper lobe, left bronchus or lung: Secondary | ICD-10-CM

## 2023-08-08 NOTE — Telephone Encounter (Signed)
 Dr. Sheryle called let us  know the patient wasn't aware of his follow up with us . It appears he cancelled his CT scan and I've asked our scheduler to get his appointment rescheduled. I let Dr. Sheryle know we would reach back out to reschedule [name] for his follow up.

## 2023-08-09 ENCOUNTER — Encounter: Payer: Self-pay | Admitting: Orthopaedic Surgery

## 2023-08-09 ENCOUNTER — Ambulatory Visit: Admitting: Orthopaedic Surgery

## 2023-08-09 VITALS — BP 115/75 | HR 86

## 2023-08-09 DIAGNOSIS — M25511 Pain in right shoulder: Secondary | ICD-10-CM | POA: Diagnosis not present

## 2023-08-09 DIAGNOSIS — G8929 Other chronic pain: Secondary | ICD-10-CM

## 2023-08-09 DIAGNOSIS — M25512 Pain in left shoulder: Secondary | ICD-10-CM

## 2023-08-09 MED ORDER — HYDROCODONE-ACETAMINOPHEN 7.5-325 MG PO TABS: 1.0000 | ORAL_TABLET | Freq: Four times a day (QID) | ORAL | 0 refills | Status: DC | PRN

## 2023-08-09 NOTE — Progress Notes (Signed)
 My shoulders hurt more.  He has pain in both shoulders, more on the left.  He has been doing things that have irritated the shoulder.  He has no swelling, no redness, no numbness.  Both shoulders have good motion but pain in the extremes.  More on the left.  NV intact.  No swelling, no redness.  Encounter Diagnoses  Name Primary?   Chronic right shoulder pain Yes   Chronic left shoulder pain    I have reviewed the [place]  Controlled Substance Reporting System web site prior to prescribing narcotic medicine for this patient.  Return prn.  Call if any problem.  Precautions discussed.  Electronically Signed Lemond Stable, MD 7/23/202510:05 AM

## 2023-08-28 ENCOUNTER — Telehealth: Payer: Self-pay | Admitting: Orthopaedic Surgery

## 2023-08-28 MED ORDER — HYDROCODONE-ACETAMINOPHEN 7.5-325 MG PO TABS: 1.0000 | ORAL_TABLET | Freq: Four times a day (QID) | ORAL | 0 refills | Status: DC | PRN

## 2023-08-28 NOTE — Telephone Encounter (Signed)
 Dr. Janae pt - pt presented to the office requesting a refill for Hydrocodone  7.5-325 to be sent to [facility] on [facility].

## 2023-09-07 ENCOUNTER — Emergency Department (HOSPITAL_COMMUNITY)

## 2023-09-07 ENCOUNTER — Other Ambulatory Visit: Payer: Self-pay

## 2023-09-07 ENCOUNTER — Inpatient Hospital Stay (HOSPITAL_COMMUNITY)
Admission: EM | Admit: 2023-09-07 | Discharge: 2023-09-09 | DRG: 392 | Disposition: A | Discharge status: Home Health | Attending: Internal Medicine | Admitting: Internal Medicine

## 2023-09-07 ENCOUNTER — Encounter (HOSPITAL_COMMUNITY): Payer: Self-pay | Admitting: Emergency Medicine

## 2023-09-07 DIAGNOSIS — I714 Abdominal aortic aneurysm, without rupture, unspecified: Secondary | ICD-10-CM | POA: Diagnosis present

## 2023-09-07 DIAGNOSIS — A084 Viral intestinal infection, unspecified: Secondary | ICD-10-CM | POA: Diagnosis present

## 2023-09-07 DIAGNOSIS — K8020 Calculus of gallbladder without cholecystitis without obstruction: Secondary | ICD-10-CM | POA: Diagnosis not present

## 2023-09-07 DIAGNOSIS — R9431 Abnormal electrocardiogram [ECG] [EKG]: Secondary | ICD-10-CM | POA: Diagnosis not present

## 2023-09-07 DIAGNOSIS — I2510 Atherosclerotic heart disease of native coronary artery without angina pectoris: Secondary | ICD-10-CM | POA: Diagnosis present

## 2023-09-07 DIAGNOSIS — I252 Old myocardial infarction: Secondary | ICD-10-CM

## 2023-09-07 DIAGNOSIS — R509 Fever, unspecified: Secondary | ICD-10-CM | POA: Diagnosis not present

## 2023-09-07 DIAGNOSIS — Z951 Presence of aortocoronary bypass graft: Secondary | ICD-10-CM | POA: Diagnosis not present

## 2023-09-07 DIAGNOSIS — Z794 Long term (current) use of insulin: Secondary | ICD-10-CM | POA: Diagnosis not present

## 2023-09-07 DIAGNOSIS — Z8249 Family history of ischemic heart disease and other diseases of the circulatory system: Secondary | ICD-10-CM | POA: Diagnosis not present

## 2023-09-07 DIAGNOSIS — Z952 Presence of prosthetic heart valve: Secondary | ICD-10-CM | POA: Diagnosis not present

## 2023-09-07 DIAGNOSIS — I10 Essential (primary) hypertension: Secondary | ICD-10-CM | POA: Diagnosis present

## 2023-09-07 DIAGNOSIS — Z87891 Personal history of nicotine dependence: Secondary | ICD-10-CM

## 2023-09-07 DIAGNOSIS — R1110 Vomiting, unspecified: Principal | ICD-10-CM

## 2023-09-07 DIAGNOSIS — N3000 Acute cystitis without hematuria: Secondary | ICD-10-CM

## 2023-09-07 DIAGNOSIS — Z89612 Acquired absence of left leg above knee: Secondary | ICD-10-CM

## 2023-09-07 DIAGNOSIS — N390 Urinary tract infection, site not specified: Secondary | ICD-10-CM | POA: Diagnosis present

## 2023-09-07 DIAGNOSIS — E782 Mixed hyperlipidemia: Secondary | ICD-10-CM | POA: Diagnosis present

## 2023-09-07 DIAGNOSIS — E1165 Type 2 diabetes mellitus with hyperglycemia: Secondary | ICD-10-CM | POA: Diagnosis present

## 2023-09-07 DIAGNOSIS — R001 Bradycardia, unspecified: Secondary | ICD-10-CM | POA: Diagnosis not present

## 2023-09-07 DIAGNOSIS — Z79899 Other long term (current) drug therapy: Secondary | ICD-10-CM | POA: Diagnosis not present

## 2023-09-07 DIAGNOSIS — Z955 Presence of coronary angioplasty implant and graft: Secondary | ICD-10-CM

## 2023-09-07 DIAGNOSIS — R112 Nausea with vomiting, unspecified: Secondary | ICD-10-CM | POA: Diagnosis not present

## 2023-09-07 DIAGNOSIS — R109 Unspecified abdominal pain: Secondary | ICD-10-CM | POA: Diagnosis present

## 2023-09-07 DIAGNOSIS — R1084 Generalized abdominal pain: Secondary | ICD-10-CM | POA: Diagnosis not present

## 2023-09-07 DIAGNOSIS — Z7984 Long term (current) use of oral hypoglycemic drugs: Secondary | ICD-10-CM

## 2023-09-07 DIAGNOSIS — Z7982 Long term (current) use of aspirin: Secondary | ICD-10-CM | POA: Diagnosis not present

## 2023-09-07 DIAGNOSIS — K529 Noninfective gastroenteritis and colitis, unspecified: Secondary | ICD-10-CM | POA: Diagnosis not present

## 2023-09-07 DIAGNOSIS — Z8679 Personal history of other diseases of the circulatory system: Secondary | ICD-10-CM | POA: Diagnosis not present

## 2023-09-07 LAB — URINALYSIS, ROUTINE W REFLEX MICROSCOPIC
Bacteria, UA: NONE SEEN
Bilirubin Urine: NEGATIVE
Glucose, UA: >=500 mg/dL — AB
Hgb urine dipstick: NEGATIVE
Ketones, ur: 80 mg/dL — AB
Leukocytes,Ua: NEGATIVE
Nitrite: NEGATIVE
Protein, ur: 30 mg/dL — AB
Specific Gravity, Urine: 1.015 (ref 1.005–1.030)
pH: 7 (ref 5.0–8.0)

## 2023-09-07 LAB — COMPREHENSIVE METABOLIC PANEL WITH GFR
ALT: 22 U/L (ref 0–44)
AST: 23 U/L (ref 15–41)
Albumin: 4.4 g/dL (ref 3.5–5.0)
Alkaline Phosphatase: 69 U/L (ref 38–126)
Anion gap: 17 — ABNORMAL HIGH (ref 5–15)
BUN: 10 mg/dL (ref 8–23)
CO2: 23 mmol/L (ref 22–32)
Calcium: 10 mg/dL (ref 8.9–10.3)
Chloride: 101 mmol/L (ref 98–111)
Creatinine, Ser: 0.76 mg/dL (ref 0.61–1.24)
GFR, Estimated: >60 mL/min (ref >60)
Glucose, Bld: 183 mg/dL — ABNORMAL HIGH (ref 70–99)
Potassium: 4 mmol/L (ref 3.5–5.1)
Sodium: 141 mmol/L (ref 135–145)
Total Bilirubin: 1.1 mg/dL (ref 0.0–1.2)
Total Protein: 8.5 g/dL — ABNORMAL HIGH (ref 6.5–8.1)

## 2023-09-07 LAB — CBC
HCT: 43.9 % (ref 39.0–52.0)
Hemoglobin: 14.1 g/dL (ref 13.0–17.0)
MCH: 29 pg (ref 26.0–34.0)
MCHC: 32.1 g/dL (ref 30.0–36.0)
MCV: 90.1 fL (ref 80.0–100.0)
Platelets: 196 K/uL (ref 150–400)
RBC: 4.87 MIL/uL (ref 4.22–5.81)
RDW: 15 % (ref 11.5–15.5)
WBC: 8.5 K/uL (ref 4.0–10.5)
nRBC: 0 % (ref 0.0–0.2)

## 2023-09-07 LAB — LIPASE, BLOOD: Lipase: 25 U/L (ref 11–51)

## 2023-09-07 LAB — LACTIC ACID, PLASMA: Lactic Acid, Venous: 1.7 mmol/L (ref 0.5–1.9)

## 2023-09-07 MED ORDER — PANTOPRAZOLE SODIUM 40 MG IV SOLR
40.0000 mg | Freq: Once | INTRAVENOUS | Status: AC
  Administered 2023-09-07: 40 mg via INTRAVENOUS
  Filled 2023-09-07: qty 10

## 2023-09-07 MED ORDER — IOHEXOL 300 MG/ML  SOLN
100.0000 mL | Freq: Once | INTRAMUSCULAR | Status: AC | PRN
  Administered 2023-09-07: 100 mL via INTRAVENOUS

## 2023-09-07 MED ORDER — ONDANSETRON HCL 4 MG/2ML IJ SOLN
4.0000 mg | Freq: Once | INTRAMUSCULAR | Status: AC
  Administered 2023-09-07: 4 mg via INTRAVENOUS
  Filled 2023-09-07: qty 2

## 2023-09-07 MED ORDER — CHLORHEXIDINE GLUCONATE CLOTH 2 % EX PADS
6.0000 | MEDICATED_PAD | Freq: Every day | CUTANEOUS | Status: DC
  Administered 2023-09-08: 6 via TOPICAL

## 2023-09-07 MED ORDER — CIPROFLOXACIN IN D5W 400 MG/200ML IV SOLN
400.0000 mg | Freq: Once | INTRAVENOUS | Status: AC
  Administered 2023-09-07: 400 mg via INTRAVENOUS
  Filled 2023-09-07: qty 200

## 2023-09-07 MED ORDER — SODIUM CHLORIDE 0.9 % IV BOLUS
2000.0000 mL | Freq: Once | INTRAVENOUS | Status: AC
  Administered 2023-09-07: 2000 mL via INTRAVENOUS

## 2023-09-07 NOTE — ED Triage Notes (Signed)
 Pt has had vomiting since last night. Some diarrhea.  Reports chills and feeling feverish.  Body aches.

## 2023-09-07 NOTE — ED Notes (Signed)
 Patient transported to CT
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
## 2023-09-08 ENCOUNTER — Inpatient Hospital Stay (HOSPITAL_COMMUNITY)

## 2023-09-08 DIAGNOSIS — R9431 Abnormal electrocardiogram [ECG] [EKG]: Secondary | ICD-10-CM

## 2023-09-08 DIAGNOSIS — R001 Bradycardia, unspecified: Secondary | ICD-10-CM | POA: Diagnosis not present

## 2023-09-08 DIAGNOSIS — Z952 Presence of prosthetic heart valve: Secondary | ICD-10-CM

## 2023-09-08 DIAGNOSIS — I2510 Atherosclerotic heart disease of native coronary artery without angina pectoris: Secondary | ICD-10-CM

## 2023-09-08 DIAGNOSIS — K529 Noninfective gastroenteritis and colitis, unspecified: Secondary | ICD-10-CM

## 2023-09-08 LAB — GLUCOSE, CAPILLARY
Glucose-Capillary: 133 mg/dL — ABNORMAL HIGH (ref 70–99)
Glucose-Capillary: 191 mg/dL — ABNORMAL HIGH (ref 70–99)
Glucose-Capillary: 154 mg/dL — ABNORMAL HIGH (ref 70–99)
Glucose-Capillary: 113 mg/dL — ABNORMAL HIGH (ref 70–99)

## 2023-09-08 LAB — ECHOCARDIOGRAM COMPLETE
AR max vel: 1.4 cm2
AV Area VTI: 1.73 cm2
AV Area mean vel: 1.7 cm2
AV Mean grad: 9 mmHg
AV Peak grad: 24.8 mmHg
Ao pk vel: 2.49 m/s
Area-P 1/2: 2.39 cm2
Height: 70 in
S' Lateral: 2.2 cm
Weight: 1918.88 [oz_av]

## 2023-09-08 LAB — CBC
HCT: 43.3 % (ref 39.0–52.0)
Hemoglobin: 14.2 g/dL (ref 13.0–17.0)
MCH: 29.6 pg (ref 26.0–34.0)
MCHC: 32.8 g/dL (ref 30.0–36.0)
MCV: 90.2 fL (ref 80.0–100.0)
Platelets: 198 K/uL (ref 150–400)
RBC: 4.8 MIL/uL (ref 4.22–5.81)
RDW: 15 % (ref 11.5–15.5)
WBC: 9.3 K/uL (ref 4.0–10.5)
nRBC: 0 % (ref 0.0–0.2)

## 2023-09-08 LAB — COMPREHENSIVE METABOLIC PANEL WITH GFR
ALT: 19 U/L (ref 0–44)
AST: 18 U/L (ref 15–41)
Albumin: 4 g/dL (ref 3.5–5.0)
Alkaline Phosphatase: 64 U/L (ref 38–126)
Anion gap: 14 (ref 5–15)
BUN: 9 mg/dL (ref 8–23)
CO2: 23 mmol/L (ref 22–32)
Calcium: 9.1 mg/dL (ref 8.9–10.3)
Chloride: 99 mmol/L (ref 98–111)
Creatinine, Ser: 0.74 mg/dL (ref 0.61–1.24)
GFR, Estimated: >60 mL/min (ref >60)
Glucose, Bld: 131 mg/dL — ABNORMAL HIGH (ref 70–99)
Potassium: 3.8 mmol/L (ref 3.5–5.1)
Sodium: 136 mmol/L (ref 135–145)
Total Bilirubin: 0.8 mg/dL (ref 0.0–1.2)
Total Protein: 7.7 g/dL (ref 6.5–8.1)

## 2023-09-08 LAB — TROPONIN I (HIGH SENSITIVITY)
Troponin I (High Sensitivity): 19 ng/L — ABNORMAL HIGH (ref <18)
Troponin I (High Sensitivity): 15 ng/L (ref <18)

## 2023-09-08 LAB — HEMOGLOBIN A1C
Hgb A1c MFr Bld: 6.3 % — ABNORMAL HIGH (ref 4.8–5.6)
Mean Plasma Glucose: 134.11 mg/dL

## 2023-09-08 LAB — MAGNESIUM: Magnesium: 1.6 mg/dL — ABNORMAL LOW (ref 1.7–2.4)

## 2023-09-08 LAB — MRSA NEXT GEN BY PCR, NASAL: MRSA by PCR Next Gen: NOT DETECTED

## 2023-09-08 LAB — PHOSPHORUS: Phosphorus: 3.1 mg/dL (ref 2.5–4.6)

## 2023-09-08 LAB — TSH: TSH: 0.397 u[IU]/mL (ref 0.350–4.500)

## 2023-09-08 MED ORDER — PIPERACILLIN-TAZOBACTAM 3.375 G IVPB
3.3750 g | Freq: Three times a day (TID) | INTRAVENOUS | Status: DC
  Administered 2023-09-08: 3.375 g via INTRAVENOUS
  Filled 2023-09-08: qty 50

## 2023-09-08 MED ORDER — INSULIN ASPART 100 UNIT/ML IJ SOLN: 0.0000 [IU] | Freq: Every day | INTRAMUSCULAR | Status: DC

## 2023-09-08 MED ORDER — ONDANSETRON HCL 4 MG PO TABS: 4.0000 mg | ORAL_TABLET | Freq: Four times a day (QID) | ORAL | Status: DC | PRN

## 2023-09-08 MED ORDER — INSULIN ASPART 100 UNIT/ML IJ SOLN
0.0000 [IU] | Freq: Three times a day (TID) | INTRAMUSCULAR | Status: DC
  Administered 2023-09-08: 3 [IU] via SUBCUTANEOUS
  Administered 2023-09-08: 1 [IU] via SUBCUTANEOUS
  Administered 2023-09-08: 2 [IU] via SUBCUTANEOUS
  Administered 2023-09-09: 1 [IU] via SUBCUTANEOUS

## 2023-09-08 MED ORDER — LACTATED RINGERS IV SOLN: INTRAVENOUS | Status: AC

## 2023-09-08 MED ORDER — METOPROLOL TARTRATE 25 MG PO TABS: 12.5000 mg | ORAL_TABLET | Freq: Two times a day (BID) | ORAL | Status: DC

## 2023-09-08 MED ORDER — ORAL CARE MOUTH RINSE: 15.0000 mL | OROMUCOSAL | Status: DC | PRN

## 2023-09-08 MED ORDER — LISINOPRIL 5 MG PO TABS
5.0000 mg | ORAL_TABLET | Freq: Once | ORAL | Status: AC
  Administered 2023-09-08: 5 mg via ORAL
  Filled 2023-09-08: qty 1

## 2023-09-08 MED ORDER — ENSURE PLUS HIGH PROTEIN PO LIQD
237.0000 mL | Freq: Two times a day (BID) | ORAL | Status: DC
  Administered 2023-09-08: 237 mL via ORAL

## 2023-09-08 MED ORDER — HYDRALAZINE HCL 20 MG/ML IJ SOLN
10.0000 mg | Freq: Four times a day (QID) | INTRAMUSCULAR | Status: DC | PRN
  Administered 2023-09-08: 10 mg via INTRAVENOUS
  Filled 2023-09-08: qty 1

## 2023-09-08 MED ORDER — MAGNESIUM SULFATE 2 GM/50ML IV SOLN
2.0000 g | Freq: Once | INTRAVENOUS | Status: AC
  Administered 2023-09-08: 2 g via INTRAVENOUS
  Filled 2023-09-08: qty 50

## 2023-09-08 MED ORDER — LINAGLIPTIN 5 MG PO TABS
5.0000 mg | ORAL_TABLET | Freq: Every day | ORAL | Status: DC
  Administered 2023-09-08 – 2023-09-09 (×2): 5 mg via ORAL
  Filled 2023-09-08 (×2): qty 1

## 2023-09-08 MED ORDER — ENOXAPARIN SODIUM 40 MG/0.4ML IJ SOSY
40.0000 mg | PREFILLED_SYRINGE | INTRAMUSCULAR | Status: DC
  Administered 2023-09-08: 40 mg via SUBCUTANEOUS
  Filled 2023-09-08: qty 0.4

## 2023-09-08 MED ORDER — LACTATED RINGERS IV SOLN: INTRAVENOUS | Status: DC

## 2023-09-08 MED ORDER — BOOST / RESOURCE BREEZE PO LIQD CUSTOM
1.0000 | Freq: Three times a day (TID) | ORAL | Status: DC
  Administered 2023-09-08: 1 via ORAL

## 2023-09-08 MED ORDER — ACETAMINOPHEN 650 MG RE SUPP: 650.0000 mg | Freq: Four times a day (QID) | RECTAL | Status: DC | PRN

## 2023-09-08 MED ORDER — LISINOPRIL 5 MG PO TABS
5.0000 mg | ORAL_TABLET | Freq: Every day | ORAL | Status: DC
  Administered 2023-09-08: 5 mg via ORAL
  Filled 2023-09-08: qty 1

## 2023-09-08 MED ORDER — ACETAMINOPHEN 325 MG PO TABS: 650.0000 mg | ORAL_TABLET | Freq: Four times a day (QID) | ORAL | Status: DC | PRN

## 2023-09-08 MED ORDER — ADULT MULTIVITAMIN W/MINERALS CH
1.0000 | ORAL_TABLET | Freq: Every day | ORAL | Status: DC
  Administered 2023-09-08 – 2023-09-09 (×2): 1 via ORAL
  Filled 2023-09-08 (×2): qty 1

## 2023-09-08 MED ORDER — ONDANSETRON HCL 4 MG/2ML IJ SOLN
4.0000 mg | Freq: Four times a day (QID) | INTRAMUSCULAR | Status: DC | PRN
  Administered 2023-09-08: 4 mg via INTRAVENOUS
  Filled 2023-09-08: qty 2

## 2023-09-08 MED ORDER — ROSUVASTATIN CALCIUM 20 MG PO TABS
20.0000 mg | ORAL_TABLET | Freq: Every day | ORAL | Status: DC
  Administered 2023-09-08 – 2023-09-09 (×2): 20 mg via ORAL
  Filled 2023-09-08 (×2): qty 1

## 2023-09-08 MED ORDER — LISINOPRIL 10 MG PO TABS
10.0000 mg | ORAL_TABLET | Freq: Every day | ORAL | Status: DC
  Administered 2023-09-09: 10 mg via ORAL
  Filled 2023-09-08: qty 1

## 2023-09-08 NOTE — Progress Notes (Signed)
 Initial Nutrition Assessment  DOCUMENTATION CODES:   Underweight  INTERVENTION:   -D/c Boost Breeze -Continue soft diet -MVI with minerals daily -Ensure Plus High Protein po BID, each supplement provides 350 kcal and 20 grams of protein   NUTRITION DIAGNOSIS:   Increased nutrient needs related to acute illness as evidenced by estimated needs.  GOAL:   Patient will meet greater than or equal to 90% of their needs  MONITOR:   PO intake, Supplement acceptance  REASON FOR ASSESSMENT:   Malnutrition Screening Tool    ASSESSMENT:   Pt with medical history significant of hypertension, T2DM, left AKA, AV replacement, cannabis hyperemesis syndrome, coronary artery disease who presentsdue to several episodes of vomiting and diarrhea which started 09/06/23, this was associated with abdominal pain which was worse around the umbilical area.  Pt admitted with acute colitis.   8/21- clear liquid diet 8/22- soft diet  Reviewed I/O's: +383 ml x 24 hours  Pt unavailable at time of visit. Attempted to speak with pt via call to hospital room phone, however, unable to reach. RD unable to obtain further nutrition-related history or complete nutrition-focused physical exam at this time.    Case discussed with RN, MD, and during rounds. Pt has been tolerating liquid diet and is requesting advancement to solid foods. He is drinking Boost Breeze supplements.   Noted lt AKA was done in 1972 per medical records.   Reviewed wt hx; pt has experienced a 19.5% wt loss over the past 6 months, which is significant for time frame. Due to significant wt loss, underweight, status, and multiple co-morbidities, suspect pt with malnutrition, however, unable to identity at this time. Pt would greatly benefit from addition of oral nutrition supplements.   Medications reviewed and include lactated ringers  infusion @ 75 ml/hr.   Lab Results  Component Value Date   HGBA1C 6.3 (H) 09/08/2023   PTA DM  medications are 5 mg tradjenta  daily, 500 mg metformin  BID, 20 units tresiba  every morning.   Labs reviewed: CBGS: 133-191 (inpatient orders for glycemic control are 0-5 units insulin  aspart daily at bedtime, 0-9 units insulin  aspart TID with meals, and 5 mg tradjenta  daily).    Diet Order:   Diet Order             DIET SOFT Room service appropriate? Yes; Fluid consistency: Thin  Diet effective now                   EDUCATION NEEDS:   No education needs have been identified at this time  Skin:  Skin Assessment: Reviewed RN Assessment  Last BM:  09/07/23  Height:   Ht Readings from Last 1 Encounters:  09/08/23 5' 10 (1.778 m)    Weight:   Wt Readings from Last 1 Encounters:  09/08/23 54.4 kg    Ideal Body Weight:  69.4 kg (adjusted for lt AKA)  BMI:  Body mass index is 17.21 kg/m.  Estimated Nutritional Needs:   Kcal:  1950-2150  Protein:  90-105 grams  Fluid:  1.9-2.1 L    Margery ORN, RD, LDN, CDCES Registered Dietitian III Certified Diabetes Care and Education Specialist If unable to reach this RD, please use RD Inpatient group chat on secure chat between hours of 8am-4 pm daily

## 2023-09-08 NOTE — Progress Notes (Signed)
 PROGRESS NOTE   [name]  FMW:[id number]    DOB: 03/07/1950    DOA: 09/07/2023  PCP: Sheryle Carwin, MD   I have briefly reviewed patients previous medical records in [facility].   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  Admitted for suspected acute gastroenteritis, low index of suspicion for colitis, self limited and improved with supportive care.  Also noted to have asymptomatic sinus bradycardia.   Assessment & Plan:   Acute gastroenteritis, suspected viral Presented with history as noted above Apart from asymptomatic sinus bradycardia (less likely related to nausea and vomiting), vital signs stable.  No fever or leukocytosis.  Venous lactate normal. CT A/P 8/21: Questionable wall thickening about the ascending colon.  This may be due to underdistention or mild colitis. No further GI symptoms.  Tolerating liquid diet and wanting to advance to soft diet, advance diet and monitor. Self-limiting.  Low index of suspicion for bacterial colitis.  Discontinued empirically started IV Zosyn .  Monitor closely  Asymptomatic sinus bradycardia Heart rate consistently in the 40s on telemetry since admission, despite resolution of GI symptoms since yesterday.  Therefore do not believe that his symptoms are vagal primarily. For now discontinued metoprolol  that he was taking 12.5 mg twice daily TSH normal Continue telemetry monitoring. Cardiology input pending.  Hypomagnesemia Replace IV and follow in a.m.  Type II DM, controlled A1c 6.3 Continue sensitive SSI and linagliptin  Await pharmacy med rec review, patient may be on  Tresiba  PTA and metformin .  Uncontrolled hypertension Metoprolol  discontinued due to sinus bradycardia Increase lisinopril  from 5 to 10 mg daily As needed IV hydralazine   Dyslipidemia Continue Crestor   THC use disorder Has been using chronically for years and unlikely to quit.  Other stable chronic issues Left AKA: Healed stump.  Prosthesis at bedside. PT evaluation CAD s/p CABG: No anginal symptoms. Following home med rec, resume aspirin .  Continue statins.  Beta-blockers held. Severe aortic valve stenosis s/p repair: Resume aspirin  when able AAA s/p EVAR: Aspirin  and statins.     Body mass index is 17.21 kg/m.   DVT prophylaxis: enoxaparin  (LOVENOX ) injection 40 mg Start: 09/08/23 1000 SCDs Start: 09/08/23 0245     Code Status: Full Code:  Family Communication: None at bedside Disposition:  Status is: Inpatient Remains inpatient appropriate because: Advancing diet, cardiology evaluating for bradycardia     Consultants:   Cardiology  Procedures:     Subjective:  Seen this morning.  Feels much better.  No nausea, vomiting, diarrhea or abdominal pain since yesterday.  Tolerating liquid diet and requesting to advance to regular diet.  No chest pain, dyspnea, dizziness or lightheadedness.  Objective:   Vitals:   09/08/23 0800 09/08/23 0856 09/08/23 0900 09/08/23 0944  BP: (!) 168/66 (!) 168/66 (!) 172/65   Pulse: (!) 51  (!) 51 (!) 51  Resp: 10  10 (!) 23  Temp:      TempSrc:      SpO2: 94%  97% 92%  Weight:      Height:        General exam: Elderly male, moderately built and thinly nourished, lying comfortably propped up in bed without distress.  Oral mucosa with borderline hydration. Respiratory system: Clear to auscultation. Respiratory effort normal. Cardiovascular system: S1 & S2 heard, regular bradycardia. No JVD, murmurs, rubs, gallops or clicks. No pedal edema.  Telemetry personally reviewed: Sinus bradycardia sustained in the 40s.  No pauses.  No  heart blocks appreciated. Gastrointestinal system: Abdomen is nondistended, soft and nontender. No organomegaly or masses felt. Normal bowel sounds heard. Central nervous system: Alert and oriented. No focal neurological deficits. Extremities: Symmetric 5 x 5 power.  Left AKA healed stump without acute findings. Skin: No rashes, lesions or ulcers Psychiatry: Judgement and insight appear normal. Mood & affect appropriate.     Data Reviewed:   I have personally reviewed following labs and imaging studies   CBC: Recent Labs  Lab 09/07/23 1755 09/08/23 0257  WBC 8.5 9.3  HGB 14.1 14.2  HCT 43.9 43.3  MCV 90.1 90.2  PLT 196 198    Basic Metabolic Panel: Recent Labs  Lab 09/07/23 1755 09/08/23 0257  NA 141 136  K 4.0 3.8  CL 101 99  CO2 23 23  GLUCOSE 183* 131*  BUN 10 9  CREATININE 0.76 0.74  CALCIUM  10.0 9.1  MG  --  1.6*  PHOS  --  3.1    Liver Function Tests: Recent Labs  Lab 09/07/23 1755 09/08/23 0257  AST 23 18  ALT 22 19  ALKPHOS 69 64  BILITOT 1.1 0.8  PROT 8.5* 7.7  ALBUMIN  4.4 4.0    CBG: Recent Labs  Lab 09/08/23 0742  GLUCAP 133*    Microbiology Studies:   Recent Results (from the past 240 hours)  MRSA Next Gen by PCR, Nasal     Status: None   Collection Time: 09/07/23 11:39 PM   Specimen: Nasal Mucosa; Nasal Swab  Result Value Ref Range Status   MRSA by PCR Next Gen NOT DETECTED NOT DETECTED Final    Comment: (NOTE) The GeneXpert MRSA Assay (FDA approved for NASAL specimens only), is one component of a comprehensive MRSA colonization surveillance program. It is not intended to diagnose MRSA infection nor to guide or monitor treatment for MRSA infections. Test performance is not FDA approved in patients less than 30 years old. Performed at [facility], [address] [place], [place] [address]   Culture, blood (Routine X 2) w Reflex to ID Panel     Status: None (Preliminary result)   Collection Time: 09/08/23  2:50 AM   Specimen:  BLOOD  Result Value Ref Range Status   Specimen Description BLOOD LEFT ANTECUBITAL  Final   Special Requests   Final    BOTTLES DRAWN AEROBIC AND ANAEROBIC Blood Culture adequate volume   Culture   Final    NO GROWTH < 12 HOURS Performed at [facility], [address], [place], [place] [address]    Report Status PENDING  Incomplete  Culture, blood (Routine X 2) w Reflex to ID Panel     Status: None (Preliminary result)   Collection Time: 09/08/23  2:50 AM   Specimen: BLOOD  Result Value Ref Range Status   Specimen Description BLOOD RIGHT ANTECUBITAL  Final   Special Requests   Final    BOTTLES DRAWN AEROBIC AND ANAEROBIC Blood Culture adequate volume   Culture   Final    NO GROWTH < 12 HOURS Performed at [facility], [address] [place], [place] [address]    Report Status PENDING  Incomplete    Radiology Studies:  CT ABDOMEN PELVIS W CONTRAST Result Date:  09/07/2023 CLINICAL DATA:  Abdominal pain and vomiting, some diarrhea, feeling feverish and body aches EXAM: CT ABDOMEN AND PELVIS WITH CONTRAST TECHNIQUE: Multidetector CT imaging of the abdomen and pelvis was performed using the standard protocol following bolus administration of intravenous contrast. RADIATION DOSE REDUCTION: This exam was performed according to the departmental dose-optimization program which includes automated exposure control, adjustment of the mA and/or kV according to patient size and/or use of iterative reconstruction technique. CONTRAST:  OMNIPAQUE  IOHEXOL  300 MG/ML  SOLN COMPARISON:  11/26/2018 twin FINDINGS: Lower chest: Chronic interstitial lung disease. No acute abnormality. Hepatobiliary: Hepatic steatosis. Cholelithiasis without evidence of acute cholecystitis. Pancreas: Unremarkable. Spleen: Unremarkable. Adrenals/Urinary Tract: Stable adrenal glands. No urinary calculi or hydronephrosis. Unremarkable bladder. Stomach/Bowel: Nondistended colon. Question mild wall thickening about the ascending  colon. No adjacent stranding or fluid. Stomach is within normal limits. Normal appendix. No bowel obstruction. Vascular/Lymphatic: Aortic atherosclerotic calcification. Status post aortic endograft repair. The excluded aneurysm sac measures 3.4 cm and is unchanged from 11/26/2022. 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 No adenopathy. Reproductive: No acute abnormality. Other: No free intraperitoneal fluid or air. Musculoskeletal: No acute fracture. IMPRESSION: 1. Question wall thickening about the ascending colon. This may be due to underdistention or mild colitis. 2. Cholelithiasis without evidence of acute cholecystitis. 3. Hepatic steatosis. 4. Status post aortic endograft repair. The excluded aneurysm sac measures 3.4 cm and is unchanged from 11/26/2022. 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 5. Aortic Atherosclerosis (ICD10-I70.0). Electronically Signed   By: Norman Gatlin M.D.   On: 09/07/2023 22:21    Scheduled Meds:    Chlorhexidine  Gluconate Cloth  6 each Topical Daily   enoxaparin  (LOVENOX ) injection  40 mg Subcutaneous Q24H   feeding supplement  1 Container Oral TID BM   insulin  aspart  0-5 Units Subcutaneous QHS   insulin  aspart  0-9 Units Subcutaneous TID WC   linagliptin   5 mg Oral Daily   lisinopril   5 mg Oral Daily   rosuvastatin   20 mg Oral Daily    Continuous Infusions:    lactated ringers  75 mL/hr at 09/08/23 0853     LOS: 1 day     Trenda Mar, MD,  FACP, [facility], [facility], [facility]   [facility] [place]      To contact the attending provider between 7A-7P or the covering provider during after hours 7P-7A, please log into the web site [email or web address] and access using universal [place] password for that web site. If you do not have the password, please call the hospital operator.  09/08/2023, 10:17 AM

## 2023-09-08 NOTE — ED Provider Notes (Signed)
 [facility] Provider Note   CSN: [id number] Arrival date & time: 09/07/23  1649     Patient presents with: Emesis   [name] is a 73 y.o. male.   Patient complains of vomiting.  No abdominal pain no diarrhea no fevers no chills  The history is provided by the patient and medical records. No language interpreter was used.  Emesis Severity:  Moderate Timing:  Constant Quality:  Bilious material Able to tolerate:  Liquids Progression:  Unchanged Chronicity:  New Recent urination:  Normal Relieved by:  Nothing Worsened by:  Nothing Associated symptoms: no abdominal pain, no cough, no diarrhea and no headaches        Prior to Admission medications   Medication Sig Start Date End Date Taking? Authorizing Provider  aspirin  EC 81 MG tablet Take 81 mg by mouth daily.   Yes [provider]  HYDROcodone -acetaminophen  (NORCO) 7.5-325 MG tablet Take 1 tablet by mouth every 6 (six) hours as needed for moderate pain (pain score 4-6). 08/28/23  Yes Brenna Lin, MD  linagliptin  (TRADJENTA ) 5 MG TABS tablet Take 5 mg by mouth daily.   Yes [provider]  lisinopril  (ZESTRIL ) 5 MG tablet Take 5 mg by mouth daily.   Yes [provider]  metFORMIN  (GLUCOPHAGE ) 500 MG tablet Take 500 mg by mouth 2 (two) times daily. 03/31/20  Yes [provider]  metoCLOPramide  (REGLAN ) 10 MG tablet Take 0.5 tablets (5 mg total) by mouth every 8 (eight) hours as needed for refractory nausea / vomiting. 11/28/22 11/28/23 Yes Ricky Fines, MD  metoprolol  tartrate (LOPRESSOR ) 25 MG tablet Take 0.5 tablets (12.5 mg total) by mouth 2 (two) times daily. 11/13/17  Yes Danton Reyes DASEN, MD  nitroGLYCERIN  (NITROSTAT ) 0.4 MG SL tablet Place 1 tablet (0.4 mg total) under the tongue every 5 (five) minutes as needed for chest pain. 02/24/22  Yes Mallipeddi, Vishnu P, MD  ondansetron  (ZOFRAN ) 4 MG tablet Take 1 tablet (4 mg total) by mouth every 6 (six) hours.  11/26/22  Yes Donnajean Lynwood DEL, PA-C  ondansetron  (ZOFRAN -ODT) 8 MG disintegrating tablet Take 1 tablet (8 mg total) by mouth every 8 (eight) hours as needed for refractory nausea / vomiting, nausea or vomiting. 11/28/22  Yes Ricky Fines, MD  rosuvastatin  (CRESTOR ) 20 MG tablet Take 1 tablet (20 mg total) by mouth daily. 02/24/22  Yes Mallipeddi, Vishnu P, MD  tamsulosin  (FLOMAX ) 0.4 MG CAPS capsule Take 0.4 mg by mouth in the morning and at bedtime.   Yes [provider]  TRESIBA  FLEXTOUCH 100 UNIT/ML FlexTouch Pen Inject 15 Units into the skin every morning. Patient taking differently: Inject 20 Units into the skin every morning. 04/06/21  Yes Sebastian Toribio GAILS, MD    Allergies: Patient has no known allergies.    Review of Systems  Constitutional:  Negative for appetite change and fatigue.  HENT:  Negative for congestion, ear discharge and sinus pressure.   Eyes:  Negative for discharge.  Respiratory:  Negative for cough.   Cardiovascular:  Negative for chest pain.  Gastrointestinal:  Positive for vomiting. Negative for abdominal pain and diarrhea.  Genitourinary:  Negative for frequency and hematuria.  Musculoskeletal:  Negative for back pain.  Skin:  Negative for rash.  Neurological:  Negative for seizures and headaches.  Psychiatric/Behavioral:  Negative for hallucinations.     Updated Vital Signs BP (!) 152/76   Pulse (!) 48   Temp 97.9 F (36.6 C) (Oral)   Resp  13   Ht 5' 10 (1.778 m)   Wt 54.4 kg   SpO2 94%   BMI 17.21 kg/m   Physical Exam Vitals and nursing note reviewed.  Constitutional:      Appearance: He is well-developed.  HENT:     Head: Normocephalic.     Nose: Nose normal.  Eyes:     General: No scleral icterus.    Conjunctiva/sclera: Conjunctivae normal.  Neck:     Thyroid: No thyromegaly.  Cardiovascular:     Rate and Rhythm: Normal rate and regular rhythm.     Heart sounds: No murmur heard.    No friction rub. No gallop.  Pulmonary:      Breath sounds: No stridor. No wheezing or rales.  Chest:     Chest wall: No tenderness.  Abdominal:     General: There is no distension.     Tenderness: There is no abdominal tenderness. There is no rebound.  Musculoskeletal:        General: Normal range of motion.     Cervical back: Neck supple.  Lymphadenopathy:     Cervical: No cervical adenopathy.  Skin:    Findings: No erythema or rash.  Neurological:     Mental Status: He is alert and oriented to person, place, and time.     Motor: No abnormal muscle tone.     Coordination: Coordination normal.  Psychiatric:        Behavior: Behavior normal.     (all labs ordered are listed, but only abnormal results are displayed) Labs Reviewed  COMPREHENSIVE METABOLIC PANEL WITH GFR - Abnormal; Notable for the following components:      Result Value   Glucose, Bld 183 (*)    Total Protein 8.5 (*)    Anion gap 17 (*)    All other components within normal limits  URINALYSIS, ROUTINE W REFLEX MICROSCOPIC - Abnormal; Notable for the following components:   Glucose, UA >=500 (*)    Ketones, ur 80 (*)    Protein, ur 30 (*)    All other components within normal limits  HEMOGLOBIN A1C - Abnormal; Notable for the following components:   Hgb A1c MFr Bld 6.3 (*)    All other components within normal limits  COMPREHENSIVE METABOLIC PANEL WITH GFR - Abnormal; Notable for the following components:   Glucose, Bld 131 (*)    All other components within normal limits  MAGNESIUM  - Abnormal; Notable for the following components:   Magnesium  1.6 (*)    All other components within normal limits  GLUCOSE, CAPILLARY - Abnormal; Notable for the following components:   Glucose-Capillary 133 (*)    All other components within normal limits  GLUCOSE, CAPILLARY - Abnormal; Notable for the following components:   Glucose-Capillary 191 (*)    All other components within normal limits  TROPONIN I (HIGH SENSITIVITY) - Abnormal; Notable for the  following components:   Troponin I (High Sensitivity) 19 (*)    All other components within normal limits  MRSA NEXT GEN BY PCR, NASAL  CULTURE, BLOOD (ROUTINE X 2)  CULTURE, BLOOD (ROUTINE X 2)  URINE CULTURE  LIPASE, BLOOD  CBC  LACTIC ACID, PLASMA  CBC  PHOSPHORUS  TSH  TROPONIN I (HIGH SENSITIVITY)    EKG: None  Radiology: CT ABDOMEN PELVIS W CONTRAST Result Date: 09/07/2023 CLINICAL DATA:  Abdominal pain and vomiting, some diarrhea, feeling feverish and body aches EXAM: CT ABDOMEN AND PELVIS WITH CONTRAST TECHNIQUE: Multidetector CT imaging of the abdomen  and pelvis was performed using the standard protocol following bolus administration of intravenous contrast. RADIATION DOSE REDUCTION: This exam was performed according to the departmental dose-optimization program which includes automated exposure control, adjustment of the mA and/or kV according to patient size and/or use of iterative reconstruction technique. CONTRAST:  OMNIPAQUE  IOHEXOL  300 MG/ML  SOLN COMPARISON:  11/26/2018 twin FINDINGS: Lower chest: Chronic interstitial lung disease. No acute abnormality. Hepatobiliary: Hepatic steatosis. Cholelithiasis without evidence of acute cholecystitis. Pancreas: Unremarkable. Spleen: Unremarkable. Adrenals/Urinary Tract: Stable adrenal glands. No urinary calculi or hydronephrosis. Unremarkable bladder. Stomach/Bowel: Nondistended colon. Question mild wall thickening about the ascending colon. No adjacent stranding or fluid. Stomach is within normal limits. Normal appendix. No bowel obstruction. Vascular/Lymphatic: Aortic atherosclerotic calcification. Status post aortic endograft repair. The excluded aneurysm sac measures 3.4 cm and is unchanged from 11/26/2022. 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 No adenopathy. Reproductive: No acute abnormality. Other: No free intraperitoneal fluid or air. Musculoskeletal: No acute fracture. IMPRESSION: 1. Question wall thickening about the  ascending colon. This may be due to underdistention or mild colitis. 2. Cholelithiasis without evidence of acute cholecystitis. 3. Hepatic steatosis. 4. Status post aortic endograft repair. The excluded aneurysm sac measures 3.4 cm and is unchanged from 11/26/2022. 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 5. Aortic Atherosclerosis (ICD10-I70.0). Electronically Signed   By: Norman Gatlin M.D.   On: 09/07/2023 22:21     Procedures   Medications Ordered in the ED  Chlorhexidine  Gluconate Cloth 2 % PADS 6 each (6 each Topical Given 09/08/23 0851)  rosuvastatin  (CRESTOR ) tablet 20 mg (20 mg Oral Given 09/08/23 0859)  linagliptin  (TRADJENTA ) tablet 5 mg (5 mg Oral Given 09/08/23 0857)  insulin  aspart (novoLOG ) injection 0-9 Units (2 Units Subcutaneous Given 09/08/23 1152)  insulin  aspart (novoLOG ) injection 0-5 Units (has no administration in time range)  hydrALAZINE  (APRESOLINE ) injection 10 mg (10 mg Intravenous Given 09/08/23 0621)  enoxaparin  (LOVENOX ) injection 40 mg (40 mg Subcutaneous Given 09/08/23 0857)  acetaminophen  (TYLENOL ) tablet 650 mg (has no administration in time range)    Or  acetaminophen  (TYLENOL ) suppository 650 mg (has no administration in time range)  ondansetron  (ZOFRAN ) tablet 4 mg ( Oral See Alternative 09/08/23 0559)    Or  ondansetron  (ZOFRAN ) injection 4 mg (4 mg Intravenous Given 09/08/23 0559)  feeding supplement (BOOST / RESOURCE BREEZE) liquid 1 Container (1 Container Oral Given 09/08/23 0851)  lactated ringers  infusion ( Intravenous Infusion Verify 09/08/23 0853)  lisinopril  (ZESTRIL ) tablet 10 mg (has no administration in time range)  iohexol  (OMNIPAQUE ) 300 MG/ML solution 100 mL (100 mLs Intravenous Contrast Given 09/07/23 2150)  sodium chloride  0.9 % bolus 2,000 mL (0 mLs Intravenous Stopped 09/07/23 2336)  ondansetron  (ZOFRAN ) injection 4 mg (4 mg Intravenous Given 09/07/23 2128)  pantoprazole  (PROTONIX ) injection 40 mg (40 mg Intravenous Given 09/07/23 2128)   ciprofloxacin  (CIPRO ) IVPB 400 mg (0 mg Intravenous Stopped 09/07/23 2349)  magnesium  sulfate IVPB 2 g 50 mL (0 g Intravenous Stopped 09/08/23 0616)  lisinopril  (ZESTRIL ) tablet 5 mg (5 mg Oral Given 09/08/23 1152)                                    Medical Decision Making Amount and/or Complexity of Data Reviewed Labs: ordered. Radiology: ordered.  Risk Prescription drug management. Decision regarding hospitalization.   Patient is admitted for vomiting and UTI and possible mild colitis     Final  diagnoses:  Uncontrollable vomiting  Acute cystitis without hematuria    ED Discharge Orders     None          Suzette Pac, MD 09/08/23 1241

## 2023-09-08 NOTE — Evaluation (Signed)
 Physical Therapy Evaluation Patient Details Name: [name] MRN: [id number] DOB: April 29, 1950 Today's Date: 09/08/2023  History of Present Illness  [name] is a 73 y.o. male with medical history significant of hypertension, T2DM, left AKA, AV replacement, cannabis hyperemesis syndrome, coronary artery disease who presents to the emergency department due to several episodes of vomiting and diarrhea which started last night, this was associated with abdominal pain which was worse around the umbilical area.  He endorsed chills and body aches.   Clinical Impression  Patient agreeable to PT evaluation. Patient was received supine in bed. Pt was mod independent with bed mobility. Pt was able to stand with CGA from bed and donn L prosthesis in standing, demonstrating good standing balance. Patient required CGA during transfers and ambulation with prosthesis donned and without AD, mainly due to ill fitting prosthesis causing unsteadiness/imbalance. Patient does not have socks present as he came to hospital from work. Patient reports plans to set appointment with prosthetist once discharged. Patient tolerates sitting in recliner at end of session, call button within reach, and nursing notified. Patient will benefit from continued skilled physical therapy acutely and in recommended venue in order to address current deficits for pt safe return home.        If plan is discharge home, recommend the following: A little help with walking and/or transfers;Help with stairs or ramp for entrance   Can travel by private vehicle        Equipment Recommendations None recommended by PT  Recommendations for Other Services       Functional Status Assessment Patient has had a recent decline in their functional status and demonstrates the ability to make significant improvements in function in a reasonable and predictable amount of time.     Precautions / Restrictions Precautions Precautions:  Fall Recall of Precautions/Restrictions: Intact Restrictions Weight Bearing Restrictions Per Provider Order: No      Mobility  Bed Mobility Overal bed mobility: Needs Assistance Bed Mobility: Supine to Sit     Supine to sit: Modified independent (Device/Increase time)     General bed mobility comments: HOB flat, inc time due to fatigue    Transfers Overall transfer level: Needs assistance Equipment used: None Transfers: Sit to/from Stand, Bed to chair/wheelchair/BSC Sit to Stand: Contact guard assist, Supervision   Step pivot transfers: Contact guard assist, Supervision       General transfer comment: CGA/supervision for safety secondary to current level of care in hospital. Pt demo mild imbalance due to ill fitting prosthesis. Mildly slow and labored movement due to general fatigue    Ambulation/Gait Ambulation/Gait assistance: Supervision, Contact guard assist Gait Distance (Feet): 20 Feet Assistive device: None Gait Pattern/deviations: Step-through pattern, Knee flexed in stance - left, Decreased step length - right, Decreased step length - left, Wide base of support Gait velocity: Dec     General Gait Details: Limited to ambulation in room, CGA throughout due to ill fitting prosthesis, no overt LOB but pt demo general unsteadiness due to having to hold onto prosthesis at times. dec velocity  Stairs            Wheelchair Mobility     Tilt Bed    Modified Rankin (Stroke Patients Only)       Balance Overall balance assessment: Needs assistance Sitting-balance support: No upper extremity supported, Feet supported Sitting balance-Leahy Scale: Good Sitting balance - Comments: Good seated EOB   Standing balance support: During functional activity, No upper extremity supported Standing balance-Leahy Scale:  Fair Standing balance comment: w/o AD, w/ and w/o prosthesis donned       Pertinent Vitals/Pain Pain Assessment Pain Assessment: No/denies pain     Home Living Family/patient expects to be discharged to:: Private residence Living Arrangements: Alone Available Help at Discharge: Family;Available 24 hours/day Type of Home: House Home Access: Stairs to enter Entrance Stairs-Rails: None Entrance Stairs-Number of Steps: 2   Home Layout: One level Home Equipment: Shower seat;Cane - single point;Other (comment) Additional Comments: Has LLE prosthesis, ill-fitting at this time, no socks present, plans to schedule appointment with prosthetist    Prior Function Prior Level of Function : Independent/Modified Independent       Mobility Comments: Reports still as community ambulator without AD, Still driving ADLs Comments: Reports independence with ADLs and iADLs     Extremity/Trunk Assessment   Upper Extremity Assessment Upper Extremity Assessment: Overall WFL for tasks assessed    Lower Extremity Assessment Lower Extremity Assessment: LLE deficits/detail;Overall WFL for tasks assessed LLE Deficits / Details: LLE AKA, prosthesis with patient, pt able to donn L prosthesis in standing, CGA for safety, pt demo fair/good balance (Pt demo adequate strength in RLE, at least 4/5 grossly) LLE Coordination: decreased gross motor    Cervical / Trunk Assessment Cervical / Trunk Assessment: Normal  Communication   Communication Communication: No apparent difficulties    Cognition Arousal: Alert Behavior During Therapy: WFL for tasks assessed/performed   PT - Cognitive impairments: No apparent impairments       Following commands: Intact       Cueing Cueing Techniques: Verbal cues     General Comments      Exercises     Assessment/Plan    PT Assessment Patient needs continued PT services;All further PT needs can be met in the next venue of care  PT Problem List Decreased strength;Decreased activity tolerance;Decreased balance;Decreased mobility       PT Treatment Interventions Balance training;Gait  training;Functional mobility training;Therapeutic activities;Therapeutic exercise;Patient/family education    PT Goals (Current goals can be found in the Care Plan section)  Acute Rehab PT Goals Patient Stated Goal: Return home PT Goal Formulation: With patient Time For Goal Achievement: 09/15/23 Potential to Achieve Goals: Good    Frequency Min 3X/week     Co-evaluation               AM-PAC PT 6 Clicks Mobility  Outcome Measure Help needed turning from your back to your side while in a flat bed without using bedrails?: None Help needed moving from lying on your back to sitting on the side of a flat bed without using bedrails?: None Help needed moving to and from a bed to a chair (including a wheelchair)?: A Little Help needed standing up from a chair using your arms (e.g., wheelchair or bedside chair)?: A Little Help needed to walk in hospital room?: A Little Help needed climbing 3-5 steps with a railing? : A Lot 6 Click Score: 19    End of Session Equipment Utilized During Treatment: Gait belt Activity Tolerance: Patient tolerated treatment well Patient left: in chair;with call bell/phone within reach Nurse Communication: Mobility status PT Visit Diagnosis: Unsteadiness on feet (R26.81);Other abnormalities of gait and mobility (R26.89);Muscle weakness (generalized) (M62.81);Difficulty in walking, not elsewhere classified (R26.2)    Time: 8994-8965 PT Time Calculation (min) (ACUTE ONLY): 29 min   Charges:   PT Evaluation $PT Eval Moderate Complexity: 1 Mod   PT General Charges $$ ACUTE PT VISIT: 1 Visit  2:51 PM, 09/08/23 Rosaria Settler, PT, DPT [place] [facility]

## 2023-09-08 NOTE — Progress Notes (Signed)
   09/08/23 0830  TOC Brief Assessment  Insurance and Status Reviewed  Patient has primary care physician Yes  Home environment has been reviewed Home - Alone  Prior level of function: Independent  Prior/Current Home Services No current home services  Social Drivers of Health Review SDOH reviewed no interventions necessary  Readmission risk has been reviewed Yes  Transition of care needs no transition of care needs at this time   Transition of Care Department [facility]) has reviewed patient and no TOC needs have been identified at this time. We will continue to monitor patient advancement through interdisciplinary progression rounds. If new patient transition needs arise, please place a TOC consult.

## 2023-09-08 NOTE — Progress Notes (Signed)
*  PRELIMINARY RESULTS* Echocardiogram 2D Echocardiogram has been performed.  [name] 09/08/2023, 3:59 PM
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
## 2023-09-08 NOTE — Plan of Care (Signed)
  Problem: Acute Rehab PT Goals(only PT should resolve) Goal: Pt Will Go Supine/Side To Sit Outcome: Progressing Flowsheets (Taken 09/08/2023 1453) Pt will go Supine/Side to Sit: Independently Goal: Patient Will Transfer Sit To/From Stand Outcome: Progressing Flowsheets (Taken 09/08/2023 1453) Patient will transfer sit to/from stand: with modified independence Goal: Pt Will Transfer Bed To Chair/Chair To Bed Outcome: Progressing Flowsheets (Taken 09/08/2023 1453) Pt will Transfer Bed to Chair/Chair to Bed: with modified independence Goal: Pt Will Ambulate Outcome: Progressing Flowsheets (Taken 09/08/2023 1453) Pt will Ambulate:  50 feet  with modified independence  with least restrictive assistive device Goal: Pt Will Go Up/Down Stairs Outcome: Progressing Flowsheets (Taken 09/08/2023 1453) Pt will Go Up / Down Stairs:  1-2 stairs  with supervision   2:54 PM, 09/08/23 Rosaria Settler, PT, DPT [place] with [facility]

## 2023-09-08 NOTE — Plan of Care (Signed)

## 2023-09-08 NOTE — TOC Initial Note (Signed)
 Transition of Care [facility]) - Initial/Assessment Note    Patient Details  Name: [name] MRN: [id number] Date of Birth: 06/25/1950  Transition of Care [facility] CM/SW Contact:    Noreen KATHEE Pinal, LCSWA Phone Number: 09/08/2023, 2:55 PM  Clinical Narrative:                 CSW spoke with patient at bedside regarding PT recommendation for HHPT. Patient declined HHPT. Patient lives alone, has family support, and does not use any equipment. Also shared that he is still able to drive and get around. TOC following.    Expected Discharge Plan: Home/Self Care Barriers to Discharge: Continued Medical Work up   Patient Goals and CMS Choice Patient states their goals for this hospitalization and ongoing recovery are:: return back home CMS Medicare.gov Compare Post Acute Care list provided to:: [name]      Expected Discharge Plan and Services In-house Referral: Clinical Social Work     Living arrangements for the past 2 months: Single Family Home                           HH Arranged: Refused HH          Prior Living Arrangements/Services Living arrangements for the past 2 months: Single Family Home Lives with:: Self Patient language and need for interpreter reviewed:: Yes Do you feel safe going back to the place where you live?: Yes      Need for Family Participation in Patient Care: Yes (Comment) Care giver support system in place?: No (comment)   Criminal Activity/Legal Involvement Pertinent to Current Situation/Hospitalization: No - Comment as needed  Activities of Daily Living   ADL Screening (condition at time of admission) Independently performs ADLs?: Yes (appropriate for developmental age) Is the patient deaf or have difficulty hearing?: No Does the patient have difficulty seeing, even when wearing glasses/contacts?: No Does the patient have difficulty concentrating, remembering, or making decisions?:  No  Permission Sought/Granted      Share Information with NAME: Joriel     Permission granted to share info w Relationship: Patient     Emotional Assessment Appearance:: Appears stated age Attitude/Demeanor/Rapport: Self-Confident, Engaged Affect (typically observed): Calm, Appropriate, Accepting Orientation: : Oriented to Self, Oriented to Place, Oriented to  Time, Oriented to Situation Alcohol / Substance Use: Not Applicable Psych Involvement: No (comment)  Admission diagnosis:  Uncontrollable vomiting [R11.10] Colitis [K52.9] UTI (urinary tract infection) [N39.0] Acute cystitis without hematuria [N30.00] Patient Active Problem List   Diagnosis Date Noted   Sinus bradycardia 09/08/2023   Colitis 09/07/2023   Malignant neoplasm of upper lobe of left lung (HCC) 02/20/2023   Solitary pulmonary hypertension (HCC) 11/23/2022   Solitary pulmonary nodule on lung CT 11/23/2022   DOE (dyspnea on exertion) 11/23/2022   Cervical spondylosis 09/27/2022   Chronic right shoulder pain 09/27/2022   Essential hypertension 05/11/2022   Type 2 diabetes mellitus with hyperglycemia (HCC) 05/11/2022   BPH (benign prostatic hyperplasia) 05/11/2022   Abdominal pain 05/10/2022   AKI (acute kidney injury) (HCC) 04/06/2021   GERD (gastroesophageal reflux disease) 04/06/2021   Leukocytosis 04/06/2021   Dehydration    Nausea & vomiting    H/O colonoscopy with polypectomy 03/30/2021   Chest pain 02/07/2021   Unilateral AKA, left (HCC) 02/07/2021   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) 07/16/2020   Intractable vomiting 07/15/2020  AAA (abdominal aortic aneurysm) (HCC) 09/06/2018   Upper gastrointestinal bleed 08/23/2018   Epigastric pain    Sudden onset of severe abdominal pain    Hematemesis with nausea 08/20/2018   H/O aortic valve replacement 11/13/2017   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 [place] [address] Phone: [phone] Fax: [phone]     Social Drivers of Health (SDOH) Social History: SDOH Screenings   Food Insecurity: No Food Insecurity (09/08/2023)  Housing: Low Risk  (09/08/2023)  Transportation Needs: No Transportation Needs (09/08/2023)  Utilities: At Risk (09/08/2023)  Depression (PHQ2-9): Low Risk  (02/21/2023)  Financial Resource Strain: Medium Risk (12/13/2021)  Social Connections: Patient Declined (09/08/2023)  Tobacco Use: Medium Risk (09/07/2023)   SDOH Interventions:     Readmission Risk Interventions    09/08/2023    2:53 PM  Readmission Risk Prevention Plan  Transportation Screening Complete  Home Care Screening Complete  Medication Review (RN CM) Complete

## 2023-09-09 DIAGNOSIS — K529 Noninfective gastroenteritis and colitis, unspecified: Secondary | ICD-10-CM | POA: Diagnosis not present

## 2023-09-09 DIAGNOSIS — R001 Bradycardia, unspecified: Secondary | ICD-10-CM | POA: Diagnosis not present

## 2023-09-09 LAB — BASIC METABOLIC PANEL WITH GFR
Anion gap: 9 (ref 5–15)
BUN: 11 mg/dL (ref 8–23)
CO2: 23 mmol/L (ref 22–32)
Calcium: 8.7 mg/dL — ABNORMAL LOW (ref 8.9–10.3)
Chloride: 100 mmol/L (ref 98–111)
Creatinine, Ser: 0.69 mg/dL (ref 0.61–1.24)
GFR, Estimated: >60 mL/min (ref >60)
Glucose, Bld: 125 mg/dL — ABNORMAL HIGH (ref 70–99)
Potassium: 3.7 mmol/L (ref 3.5–5.1)
Sodium: 132 mmol/L — ABNORMAL LOW (ref 135–145)

## 2023-09-09 LAB — URINE CULTURE: Culture: NO GROWTH

## 2023-09-09 LAB — GLUCOSE, CAPILLARY: Glucose-Capillary: 141 mg/dL — ABNORMAL HIGH (ref 70–99)

## 2023-09-09 LAB — MAGNESIUM: Magnesium: 2 mg/dL (ref 1.7–2.4)

## 2023-09-09 MED ORDER — LISINOPRIL 5 MG PO TABS: 10.0000 mg | ORAL_TABLET | Freq: Every day | ORAL | 1 refills | Status: AC

## 2023-09-09 MED ORDER — ADULT MULTIVITAMIN W/MINERALS CH: 1.0000 | ORAL_TABLET | Freq: Every day | ORAL | Status: AC

## 2023-09-09 NOTE — Discharge Instructions (Signed)

## 2023-09-09 NOTE — Discharge Summary (Addendum)
 Physician Discharge Summary  DYLAND PANUCO FMW:[id number] DOB: 17-Apr-1950  PCP: Sheryle Carwin, MD  Admitted from: Home Discharged to: Home  Admit date: 09/07/2023 Discharge date: 09/09/2023  Recommendations for Outpatient Follow-up:    Follow-up Information     Sheryle Carwin, MD. Schedule an appointment as soon as possible for a visit in 1 week(s).   Specialty: Internal Medicine Why: To be seen with repeat labs (CBC & BMP). Contact information: [address] [place] [address] [phone]         Stacia Diannah SQUIBB, MD. Schedule an appointment as soon as possible for a visit in 2 week(s).   Specialties: Cardiology, Internal Medicine Contact information: [address] [place] [address] [phone]                  Home Health: Home Health Orders (From admission, onward)     Start     Ordered   09/09/23 0754  Home Health  At discharge       Question:  To provide the following care/treatments  Answer:  PT   09/09/23 0755             Equipment/Devices: None    Discharge Condition: Improved and stable.   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   Admitted for suspected acute gastroenteritis, low index of suspicion for colitis, self limited and improved with supportive care.  Also noted to have asymptomatic sinus bradycardia.     Assessment & Plan:    Acute gastroenteritis, suspected viral Presented with history as noted above Apart from asymptomatic sinus bradycardia (less likely related to nausea and vomiting), vital signs stable.  No fever or leukocytosis.  Venous lactate normal. CT A/P 8/21: Questionable wall thickening about the ascending colon.  This may be due to underdistention or mild colitis. Self-limiting.  Low index of suspicion for bacterial colitis.  Discontinued empirically started IV Zosyn .   Patient has done quite well.  Diet was advanced which she has tolerated.  No further GI symptoms. Outpatient follow-up with PCP and if patient has not had screening colonoscopy then that can be considered.   Asymptomatic sinus bradycardia 8/22: Heart rate consistently in the 40s on telemetry since admission, despite resolution of GI symptoms since yesterday.  Therefore do not believe that his symptoms are vagal primarily. Discontinued low-dose metoprolol  that he was on at home, likely etiology of his sinus bradycardia. TSH normal Monitored on telemetry and sinus bradycardia is essentially resolved Cardiology input appreciated,  there was some thought of aortic valve endocarditis if there was evidence of bacteremia and blood cultures thus far are negative to date and the final cultures to be followed as outpatient.  They did obtain an echocardiogram due to abnormal EKG which showed preserved LVEF and detailed report as below.  No stress testing recommended.   Hypomagnesemia Replaced.   Type II DM, controlled A1c 6.3 Treated in the hospital with SSI and linagliptin . At discharge, continue PTA meds as below.   Uncontrolled hypertension Metoprolol  discontinued due to  sinus bradycardia Lisinopril  was increased from 5 to 10 mg daily with improved control.  Close outpatient follow-up for further adjustments.   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   Other stable chronic issues Left AKA: Healed stump.  Prosthesis at bedside. PT evaluated and recommended home health PT. CAD s/p CABG: No anginal symptoms.  Continue aspirin  and statins.  Beta-blockers discontinued due to bradycardia. Severe aortic valve stenosis s/p repair: Continue aspirin . AAA s/p EVAR: Aspirin  and statins. History of cannabis hyperemesis syndrome: Patient reports that he has not taken any of his antiemetics in more than 5 months, discontinued from med list.   Body mass index is 17.21 kg/m.      Consultants:   Cardiology   Procedures:       Discharge Instructions  Discharge Instructions     Call MD for:   Complete by: As directed    Recurrent diarrhea.   Call MD for:  difficulty breathing, headache or visual disturbances   Complete by: As directed    Call MD for:  extreme fatigue   Complete by: As directed    Call MD for:  persistant dizziness or light-headedness   Complete by: As directed    Call MD for:  persistant nausea and vomiting   Complete by: As directed    Call MD for:  severe uncontrolled pain   Complete by: As directed    Call MD for:  temperature >100.4   Complete by: As directed    Diet - low sodium heart healthy   Complete by: As directed    Diet Carb Modified   Complete by: As directed    Increase activity slowly   Complete by: As directed         Medication List     STOP taking these medications    metoCLOPramide  10 MG tablet Commonly known as: Reglan    metoprolol  tartrate 25 MG tablet Commonly known as: LOPRESSOR    ondansetron  4 MG tablet Commonly known as: ZOFRAN    ondansetron  8 MG disintegrating tablet Commonly known as: ZOFRAN -ODT       TAKE these  medications    aspirin  EC 81 MG tablet Take 81 mg by mouth daily.   HYDROcodone -acetaminophen  7.5-325 MG tablet Commonly known as: NORCO Take 1 tablet by mouth every 6 (six) hours as needed for moderate pain (pain score 4-6).   linagliptin  5 MG Tabs tablet Commonly known as: TRADJENTA  Take 5 mg by mouth daily.   lisinopril  5 MG tablet Commonly known as: ZESTRIL  Take 2 tablets (10 mg total) by mouth daily. What changed: how much to take   metFORMIN  500 MG tablet Commonly known as: GLUCOPHAGE  Take 500 mg by mouth 2 (two) times daily.   multivitamin with minerals Tabs tablet Take 1 tablet by mouth daily.   nitroGLYCERIN  0.4 MG SL tablet Commonly known as: NITROSTAT  Place 1 tablet (0.4 mg total) under  the tongue every 5 (five) minutes as needed for chest pain.   rosuvastatin  20 MG tablet Commonly known as: Crestor  Take 1 tablet (20 mg total) by mouth daily.   tamsulosin  0.4 MG Caps capsule Commonly known as: FLOMAX  Take 0.4 mg by mouth in the morning and at bedtime.   Tresiba  FlexTouch 100 UNIT/ML FlexTouch Pen Generic drug: insulin  degludec Inject 15 Units into the skin every morning. What changed: how much to take       No Known Allergies    Procedures/Studies: ECHOCARDIOGRAM COMPLETE Result Date: 09/08/2023    ECHOCARDIOGRAM REPORT   Patient Name:   [name] Date of Exam: 09/08/2023 Medical Rec #:  [id number]        Height:       70.0 in Accession #:    [id number]       Weight:       119.9 lb Date of Birth:  [birth year]       BSA:          1.680 m Patient Age:    72 years         BP:           172/65 mmHg Patient Gender: M                HR:           56 bpm. Exam Location:  [facility] Procedure: 2D Echo, Cardiac Doppler and Color Doppler (Both Spectral and Color            Flow Doppler were utilized during procedure). Indications:    Abnormal ECG R94.31  History:        Patient has prior history of Echocardiogram examinations, most                 recent  02/08/2021. CAD and Previous Myocardial Infarction, Prior                 CABG, Aortic Valve Disease, Signs/Symptoms:Murmur; Risk                 Factors:Hypertension, Diabetes, Dyslipidemia and Former Smoker.                 Hx of TAVR.                 Aortic Valve: 23 mm Edwards pericardial valve is present in the                 aortic position. Procedure Date: 2019.  Sonographer:    Aida Pizza RCS Referring Phys: [phone] DAYNA N DUNN IMPRESSIONS  1. Left ventricular ejection fraction, by estimation, is 65 to 70%. The left ventricle has normal function. The left ventricle has no regional wall motion abnormalities. There is mild concentric left ventricular hypertrophy. Left ventricular diastolic parameters are indeterminate.  2. Right ventricular systolic function is normal. The right ventricular size is normal. There is normal pulmonary artery systolic pressure. The estimated right ventricular systolic pressure is 29.6 mmHg.  3. The mitral valve is degenerative. Mild to moderate mitral valve regurgitation.  4. The aortic valve has been repaired/replaced. Aortic valve regurgitation is not visualized. There is a 23 mm Edwards pericardial valve present in the aortic position. Procedure Date: 2019. Aortic valve mean gradient measures 9.0 mmHg.  5. The inferior vena cava is normal in size with greater than 50% respiratory variability, suggesting right atrial pressure of 3 mmHg. Comparison(s): Prior images reviewed side by side. LVEF vigorous at  65-70%. Mild to moderate mitral regurgitation. Stable pericardial AVR with grossly normal function and mean AV gradient 9 mmHg. FINDINGS  Left Ventricle: Left ventricular ejection fraction, by estimation, is 65 to 70%. The left ventricle has normal function. The left ventricle has no regional wall motion abnormalities. The left ventricular internal cavity size was normal in size. There is  mild concentric left ventricular hypertrophy. Left ventricular diastolic parameters are  indeterminate. Right Ventricle: The right ventricular size is normal. No increase in right ventricular wall thickness. Right ventricular systolic function is normal. There is normal pulmonary artery systolic pressure. The tricuspid regurgitant velocity is 2.58 m/s, and  with an assumed right atrial pressure of 3 mmHg, the estimated right ventricular systolic pressure is 29.6 mmHg. Left Atrium: Left atrial size was normal in size. Right Atrium: Right atrial size was normal in size. Pericardium: There is no evidence of pericardial effusion. Mitral Valve: The mitral valve is degenerative in appearance. There is mild thickening of the mitral valve leaflet(s). Mild to moderate mitral annular calcification. Mild to moderate mitral valve regurgitation, with posteriorly-directed jet. Tricuspid Valve: The tricuspid valve is grossly normal. Tricuspid valve regurgitation is mild. Aortic Valve: The aortic valve has been repaired/replaced. Aortic valve regurgitation is not visualized. Aortic valve mean gradient measures 9.0 mmHg. Aortic valve peak gradient measures 24.8 mmHg. Aortic valve area, by VTI measures 1.73 cm. There is a 23 mm Edwards pericardial valve present in the aortic position. Procedure Date: 2019. Pulmonic Valve: The pulmonic valve was grossly normal. Pulmonic valve regurgitation is mild. Aorta: The aortic root is normal in size and structure. Venous: The inferior vena cava is normal in size with greater than 50% respiratory variability, suggesting right atrial pressure of 3 mmHg. IAS/Shunts: No atrial level shunt detected by color flow Doppler. Additional Comments: 3D was performed not requiring image post processing on an independent workstation and was indeterminate.  LEFT VENTRICLE PLAX 2D LVIDd:         3.80 cm   Diastology LVIDs:         2.20 cm   LV e' medial:    5.35 cm/s LV PW:         1.30 cm   LV E/e' medial:  23.9 LV IVS:        1.20 cm   LV e' lateral:   10.40 cm/s LVOT diam:     1.90 cm   LV E/e'  lateral: 12.3 LV SV:         69 LV SV Index:   41 LVOT Area:     2.84 cm  RIGHT VENTRICLE RV S prime:     7.96 cm/s TAPSE (M-mode): 1.5 cm LEFT ATRIUM             Index        RIGHT ATRIUM           Index LA diam:        3.90 cm 2.32 cm/m   RA Area:     15.10 cm LA Vol (A2C):   41.6 ml 24.77 ml/m  RA Volume:   44.90 ml  26.73 ml/m LA Vol (A4C):   41.8 ml 24.89 ml/m LA Biplane Vol: 44.8 ml 26.67 ml/m  AORTIC VALVE AV Area (Vmax):    1.40 cm AV Area (Vmean):   1.70 cm AV Area (VTI):     1.73 cm AV Vmax:           249.00 cm/s AV Vmean:  127.000 cm/s AV VTI:            0.399 m AV Peak Grad:      24.8 mmHg AV Mean Grad:      9.0 mmHg LVOT Vmax:         123.00 cm/s LVOT Vmean:        76.200 cm/s LVOT VTI:          0.243 m LVOT/AV VTI ratio: 0.61  AORTA Ao Root diam: 3.70 cm MITRAL VALVE                TRICUSPID VALVE MV Area (PHT): 2.39 cm     TR Peak grad:   26.6 mmHg MV Decel Time: 317 msec     TR Vmax:        258.00 cm/s MV E velocity: 128.00 cm/s MV A velocity: 166.00 cm/s  SHUNTS MV E/A ratio:  0.77         Systemic VTI:  0.24 m                             Systemic Diam: 1.90 cm Jayson Sierras MD Electronically signed by Jayson Sierras MD Signature Date/Time: 09/08/2023/4:28:36 PM    Final    CT ABDOMEN PELVIS W CONTRAST Result Date: 09/07/2023 CLINICAL DATA:  Abdominal pain and vomiting, some diarrhea, feeling feverish and body aches EXAM: CT ABDOMEN AND PELVIS WITH CONTRAST TECHNIQUE: Multidetector CT imaging of the abdomen and pelvis was performed using the standard protocol following bolus administration of intravenous contrast. RADIATION DOSE REDUCTION: This exam was performed according to the departmental dose-optimization program which includes automated exposure control, adjustment of the mA and/or kV according to patient size and/or use of iterative reconstruction technique. CONTRAST:  OMNIPAQUE  IOHEXOL  300 MG/ML  SOLN COMPARISON:  11/26/2018 twin FINDINGS: Lower chest: Chronic  interstitial lung disease. No acute abnormality. Hepatobiliary: Hepatic steatosis. Cholelithiasis without evidence of acute cholecystitis. Pancreas: Unremarkable. Spleen: Unremarkable. Adrenals/Urinary Tract: Stable adrenal glands. No urinary calculi or hydronephrosis. Unremarkable bladder. Stomach/Bowel: Nondistended colon. Question mild wall thickening about the ascending colon. No adjacent stranding or fluid. Stomach is within normal limits. Normal appendix. No bowel obstruction. Vascular/Lymphatic: Aortic atherosclerotic calcification. Status post aortic endograft repair. The excluded aneurysm sac measures 3.4 cm and is unchanged from 11/26/2022. 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 No adenopathy. Reproductive: No acute abnormality. Other: No free intraperitoneal fluid or air. Musculoskeletal: No acute fracture. IMPRESSION: 1. Question wall thickening about the ascending colon. This may be due to underdistention or mild colitis. 2. Cholelithiasis without evidence of acute cholecystitis. 3. Hepatic steatosis. 4. Status post aortic endograft repair. The excluded aneurysm sac measures 3.4 cm and is unchanged from 11/26/2022. 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 5. Aortic Atherosclerosis (ICD10-I70.0). Electronically Signed   By: Norman Gatlin M.D.   On: 09/07/2023 22:21      Subjective: Doing well.  Denies complaints.  Wants to know if he can be discharged early because he has a Set designer coming home at noon.  No BM since day of hospital admission.  Tolerating regular diet without nausea, vomiting or abdominal pain.  Passing flatus.  No chest pain, dyspnea, palpitations, dizziness or lightheadedness.  Worked with PT yesterday.  Discharge Exam:  Vitals:   09/08/23 2001 09/09/23 0027 09/09/23 0438 09/09/23 0729  BP: (!) 151/86     Pulse: 72     Resp: 14     Temp: 97.8 F (  36.6 C) 97.6 F (36.4 C) (!) 97.5 F (36.4 C) 97.8 F (36.6 C)  TempSrc: Oral Oral Oral Oral  SpO2: 97%      Weight:      Height:        General exam: Elderly male, moderately built and thinly nourished, lying comfortably propped up in bed without distress.  Oral mucosa moist. Respiratory system: Clear to auscultation. Respiratory effort normal. Cardiovascular system: S1 & S2 heard, regular bradycardia. No JVD, murmurs, rubs, gallops or clicks. No pedal edema.  Telemetry personally reviewed: Mostly in sinus rhythm since about 11 AM yesterday with occasional mild sinus bradycardia in the 50s.  Improved compared to day before yesterday. Gastrointestinal system: Abdomen is nondistended, soft and nontender. No organomegaly or masses felt. Normal bowel sounds heard. Central nervous system: Alert and oriented. No focal neurological deficits. Extremities: Symmetric 5 x 5 power.  Left AKA healed stump without acute findings. Skin: No rashes, lesions or ulcers Psychiatry: Judgement and insight appear normal. Mood & affect appropriate.     The results of significant diagnostics from this hospitalization (including imaging, microbiology, ancillary and laboratory) are listed below for reference.     Microbiology: Recent Results (from the past 240 hours)  MRSA Next Gen by PCR, Nasal     Status: None   Collection Time: 09/07/23 11:39 PM   Specimen: Nasal Mucosa; Nasal Swab  Result Value Ref Range Status   MRSA by PCR Next Gen NOT DETECTED NOT DETECTED Final    Comment: (NOTE) The GeneXpert MRSA Assay (FDA approved for NASAL specimens only), is one component of a comprehensive MRSA colonization surveillance program. It is not intended to diagnose MRSA infection nor to guide or monitor treatment for MRSA infections. Test performance is not FDA approved in patients less than 83 years old. Performed at [facility], [address] [place], [place] [address]   Culture, blood (Routine X 2) w Reflex to ID Panel     Status: None (Preliminary result)   Collection Time: 09/08/23  2:50 AM   Specimen: BLOOD   Result Value Ref Range Status   Specimen Description BLOOD LEFT ANTECUBITAL  Final   Special Requests   Final    BOTTLES DRAWN AEROBIC AND ANAEROBIC Blood Culture adequate volume   Culture   Final    NO GROWTH < 12 HOURS Performed at [facility], [address] [place], [place] [address]    Report Status PENDING  Incomplete  Culture, blood (Routine X 2) w Reflex to ID Panel     Status: None (Preliminary result)   Collection Time: 09/08/23  2:50 AM   Specimen: BLOOD  Result Value Ref Range Status   Specimen Description BLOOD RIGHT ANTECUBITAL  Final   Special Requests   Final    BOTTLES DRAWN AEROBIC AND ANAEROBIC Blood Culture adequate volume   Culture   Final    NO GROWTH < 12 HOURS Performed at [facility], [address], [place], [place] [address]    Report Status PENDING  Incomplete     Labs: CBC: Recent Labs  Lab 09/07/23 1755 09/08/23 0257  WBC 8.5 9.3  HGB 14.1 14.2  HCT 43.9 43.3  MCV 90.1 90.2  PLT 196 198    Basic Metabolic Panel: Recent Labs  Lab 09/07/23 1755 09/08/23 0257 09/09/23 0459  NA 141 136 132*  K 4.0 3.8 3.7  CL 101 99 100  CO2 23 23 23   GLUCOSE 183* 131* 125*  BUN 10 9 11  CREATININE 0.76 0.74 0.69  CALCIUM  10.0 9.1 8.7*  MG  --  1.6* 2.0  PHOS  --  3.1  --     Liver Function Tests: Recent Labs  Lab 09/07/23 1755 09/08/23 0257  AST 23 18  ALT 22 19  ALKPHOS 69 64  BILITOT 1.1 0.8  PROT 8.5* 7.7  ALBUMIN  4.4 4.0    CBG: Recent Labs  Lab 09/08/23 0742 09/08/23 1147 09/08/23 1619 09/08/23 1949 09/09/23 0730  GLUCAP 133* 191* 154* 113* 141*    Hgb A1c Recent Labs    09/08/23 0250  HGBA1C 6.3*    Thyroid function studies Recent Labs    09/08/23 0254  TSH 0.397     Urinalysis    Component Value Date/Time   COLORURINE YELLOW 09/07/2023 2050   APPEARANCEUR CLEAR 09/07/2023 2050   LABSPEC 1.015 09/07/2023 2050   PHURINE 7.0 09/07/2023 2050   GLUCOSEU >=500 (A) 09/07/2023 2050   GLUCOSEU neg  02/03/2009 1250   HGBUR NEGATIVE 09/07/2023 2050   BILIRUBINUR NEGATIVE 09/07/2023 2050   KETONESUR 80 (A) 09/07/2023 2050   PROTEINUR 30 (A) 09/07/2023 2050   NITRITE NEGATIVE 09/07/2023 2050   LEUKOCYTESUR NEGATIVE 09/07/2023 2050      Time coordinating discharge: 35 minutes  SIGNED:  Trenda Mar, MD,  FACP, [facility], [facility], [facility]   [facility] Ad[facility] [place]     To contact the attending provider between 7A-7P or the covering provider during after hours 7P-7A, please log into the web site [email or web address] and access using universal [place] password for that web site. If you do not have the password, please call the hospital operator.

## 2023-09-09 NOTE — Plan of Care (Signed)

## 2023-09-11 ENCOUNTER — Telehealth: Payer: Self-pay

## 2023-09-11 NOTE — Patient Instructions (Signed)
 Visit Information  Thank you for taking time to visit with me today. Please don't hesitate to contact me if I can be of assistance to you.  Call MD for: Recurrent diarrhea. Call MD for: difficulty breathing, headache or visual disturbances Call MD for: extreme fatigue Call MD for: persistant dizziness or light-headedness Call MD for: persistant nausea and vomiting Call MD for: severe uncontrolled pain Call MD for: temperature >100.4  The patient verbalized understanding of instructions, educational materials, and care plan provided today and DECLINED offer to receive copy of patient instructions, educational materials, and care plan.   The patient has been provided with contact information for the care management team and has been advised to call with any health related questions or concerns.   Please call the care guide team at [phone] if you need to cancel or reschedule your appointment.   Please call the Suicide and Crisis Lifeline: 988 if you are experiencing a Mental Health or Behavioral Health Crisis or need someone to talk to.  [name] J. Prabhnoor Ellenberger RN, MSN [facility], [facility] RN Care Manager Direct Dial: [phone]  Fax: [phone] Website: [email or web address]

## 2023-09-11 NOTE — Transitions of Care (Post Inpatient/ED Visit) (Signed)
 09/11/2023  Name: [name] MRN: [id number] DOB: 06/24/1950  Today's TOC FU Call Status: Today's TOC FU Call Status:: Successful TOC FU Call Completed TOC FU Call Complete Date: 09/11/23 Patient's Name and Date of Birth confirmed.  Transition Care Management Follow-up Telephone Call Date of Discharge: 09/09/23 [facility] Type of Discharge: Inpatient Admission Primary Inpatient Discharge Diagnosis:: Colitis How have you been since you were released from the hospital?: Better Any questions or concerns?: No  Items Reviewed: Did you receive and understand the discharge instructions provided?: Yes Medications obtained,verified, and reconciled?: Yes (Medications Reviewed) Any new allergies since your discharge?: No Dietary orders reviewed?: Yes Type of Diet Ordered:: carb modified low sodium heart healthy Do you have support at home?: No  Medications Reviewed Today: Medications Reviewed Today     Reviewed by Madysyn Hanken, RN (Case Manager) on 09/11/23 at 1329  Med List Status: <None>   Medication Order Taking? Sig Documenting Provider Last Dose Status Informant  aspirin  EC 81 MG tablet [id number] Yes Take 81 mg by mouth daily. [provider]  Active Self, Pharmacy Records  HYDROcodone -acetaminophen  (NORCO) 7.5-325 MG tablet [id number] Yes Take 1 tablet by mouth every 6 (six) hours as needed for moderate pain (pain score 4-6). Brenna Lin, MD  Active Self, Pharmacy Records  linagliptin  (TRADJENTA ) 5 MG TABS tablet [id number] Yes Take 5 mg by mouth daily. [provider]  Active Self, Pharmacy Records  lisinopril  (ZESTRIL ) 5 MG tablet [id number] Yes Take 2 tablets (10 mg total) by mouth daily. Hongalgi, Anand D, MD  Active   metFORMIN  (GLUCOPHAGE ) 500 MG tablet [id number] Yes Take 500 mg by mouth 2 (two) times daily. [provider]  Active Self, Pharmacy Records  Multiple Vitamin (MULTIVITAMIN WITH MINERALS) TABS tablet  [id number] Yes Take 1 tablet by mouth daily. Hongalgi, Anand D, MD  Active   nitroGLYCERIN  (NITROSTAT ) 0.4 MG SL tablet [id number]  Place 1 tablet (0.4 mg total) under the tongue every 5 (five) minutes as needed for chest pain. Mallipeddi, Diannah SQUIBB, MD  Active Self, Pharmacy Records           Med Note JACKOLYN WADDELL DEL   Fri Sep 08, 2023 10:46 AM)    rosuvastatin  (CRESTOR ) 20 MG tablet [id number] Yes Take 1 tablet (20 mg total) by mouth daily. Mallipeddi, Vishnu SQUIBB, MD  Active Self, Pharmacy Records  tamsulosin  (FLOMAX ) 0.4 MG CAPS capsule [id number] Yes Take 0.4 mg by mouth in the morning and at bedtime. [provider]  Active Self, Pharmacy Records  TRESIBA  FLEXTOUCH 100 UNIT/ML FlexTouch Pen [id number] Yes Inject 15 Units into the skin every morning. Sebastian Toribio GAILS, MD  Active Self, Pharmacy Records            Home Care and Equipment/Supplies: Were Home Health Services Ordered?: No Any new equipment or medical supplies ordered?: No  Functional Questionnaire: Do you need assistance with bathing/showering or dressing?: No Do you need assistance with meal preparation?: No Do you need assistance with eating?: No Do you have difficulty maintaining continence: No Do you need assistance with getting out of bed/getting out of a chair/moving?: No Do you have difficulty managing or taking your medications?: No  Follow up appointments reviewed: PCP Follow-up appointment confirmed?: Yes Date of PCP follow-up appointment?: 09/11/23 Follow-up Provider: Dr. Sheryle Driscilla Salvage Follow-up appointment confirmed?: No Reason Specialist Follow-Up Not Confirmed: Patient has Specialist Provider Number and will Call for Appointment Do you need transportation to your follow-up appointment?: No Do  you understand care options if your condition(s) worsen?: Yes-patient verbalized understanding  SDOH Interventions Today    Flowsheet Row Most Recent Value  SDOH Interventions   Food  Insecurity Interventions Intervention Not Indicated  Housing Interventions Intervention Not Indicated  Transportation Interventions Intervention Not Indicated  Utilities Interventions Intervention Not Indicated    Jase Reep J. Amour Trigg RN, MSN [facility]  [facility], [facility] RN Care Manager Direct Dial: [phone]  Fax: [phone] Website: [email or web address]

## 2023-09-13 DIAGNOSIS — Z79899 Other long term (current) drug therapy: Secondary | ICD-10-CM | POA: Diagnosis not present

## 2023-09-13 DIAGNOSIS — R001 Bradycardia, unspecified: Secondary | ICD-10-CM | POA: Diagnosis not present

## 2023-09-13 DIAGNOSIS — K529 Noninfective gastroenteritis and colitis, unspecified: Secondary | ICD-10-CM | POA: Diagnosis not present

## 2023-09-13 LAB — CULTURE, BLOOD (ROUTINE X 2)
Culture: NO GROWTH
Culture: NO GROWTH
Special Requests: ADEQUATE
Special Requests: ADEQUATE

## 2023-09-19 ENCOUNTER — Telehealth: Payer: Self-pay | Admitting: *Deleted

## 2023-09-19 NOTE — Telephone Encounter (Signed)
 CALLED PATIENT TO ASK ABOUT DOING A CT, PATIENT REQUESTED THAT I CALL BACK TOMORROW 09-20-23

## 2023-09-20 ENCOUNTER — Telehealth: Payer: Self-pay | Admitting: Orthopaedic Surgery

## 2023-09-20 ENCOUNTER — Telehealth: Payer: Self-pay | Admitting: *Deleted

## 2023-09-20 MED ORDER — HYDROCODONE-ACETAMINOPHEN 7.5-325 MG PO TABS: 1.0000 | ORAL_TABLET | Freq: Four times a day (QID) | ORAL | 0 refills | Status: DC | PRN

## 2023-09-20 NOTE — Telephone Encounter (Signed)
 Called patient to inform of Ct for 10-20-23- arrival time- 4:45 pm ,@ [facility] Radiology, no restrictions to scan, patient to receive results from Donald Husband on 10-30-23 @ 3 pm via telephone, spoke with patient and he is aware of these appts. and the instructions

## 2023-09-20 NOTE — Telephone Encounter (Signed)
 DR. BRENNA   Patient came in the office to request refill on his pain medicine   HYDROcodone -acetaminophen  (NORCO) 7.5-325 MG tablet     PHARMACY:  [facility] [address]

## 2023-10-20 ENCOUNTER — Ambulatory Visit (HOSPITAL_COMMUNITY)
Admission: RE | Admit: 2023-10-20 | Discharge: 2023-10-20 | Disposition: A | Discharge status: Home / Self Care | Source: Ambulatory Visit | Attending: Radiation Oncology | Admitting: Radiation Oncology

## 2023-10-20 DIAGNOSIS — R079 Chest pain, unspecified: Secondary | ICD-10-CM | POA: Diagnosis not present

## 2023-10-20 DIAGNOSIS — J8410 Pulmonary fibrosis, unspecified: Secondary | ICD-10-CM | POA: Diagnosis not present

## 2023-10-20 DIAGNOSIS — I700 Atherosclerosis of aorta: Secondary | ICD-10-CM | POA: Diagnosis not present

## 2023-10-20 DIAGNOSIS — C3490 Malignant neoplasm of unspecified part of unspecified bronchus or lung: Secondary | ICD-10-CM | POA: Diagnosis not present

## 2023-10-20 DIAGNOSIS — C3412 Malignant neoplasm of upper lobe, left bronchus or lung: Secondary | ICD-10-CM | POA: Insufficient documentation

## 2023-10-20 DIAGNOSIS — J439 Emphysema, unspecified: Secondary | ICD-10-CM | POA: Diagnosis not present

## 2023-10-20 MED ORDER — IOHEXOL 300 MG/ML  SOLN
75.0000 mL | Freq: Once | INTRAMUSCULAR | Status: AC | PRN
  Administered 2023-10-20: 75 mL via INTRAVENOUS

## 2023-10-24 LAB — POCT I-STAT CREATININE: Creatinine, Ser: 0.9 mg/dL (ref 0.61–1.24)

## 2023-10-27 NOTE — Progress Notes (Addendum)
 CLINICAL DATA:  Non-small-cell lung cancer restaging, status post SBRT   Patient complaints?  No chest pain or SOB per patient. Does note some bilateral shoulder pain but states that is residual from his job and has been a problem for a long time.   Follow up call to patient to receive Chest CT results from 10/20/2023  IMPRESSION: 1. Interval decrease in size of a mass in the medial left upper lobe, consistent with treatment response. 2. Redemonstrated mediastinal lymphadenopathy, with interval increase in size of a partially necrotic appearing prevascular lymph node. Other lymph nodes unchanged. 3. Suspect enlargement of a left adrenal nodule, measuring 2.0 x 1.5 cm previously no greater than 1.3 x 1.0 cm at the time of a PET-CT dated 11/24/2022. 4. PET-CT may be helpful to assess for metabolically active disease. 5. Unchanged moderate pulmonary fibrosis in a pattern with apical to basal gradient featuring irregular peripheral interstitial opacity, septal thickening, traction bronchiectasis, and subpleural bronchiolectasis throughout the bilateral lung bases. 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 6. Moderate underlying emphysema. 7. Coronary artery disease. 8. Cholelithiasis. 9. Nonobstructive left nephrolithiasis.

## 2023-10-30 ENCOUNTER — Encounter (HOSPITAL_COMMUNITY): Payer: Self-pay

## 2023-10-30 ENCOUNTER — Encounter: Payer: Self-pay | Admitting: Radiation Oncology

## 2023-10-30 ENCOUNTER — Ambulatory Visit
Admission: RE | Admit: 2023-10-30 | Discharge: 2023-10-30 | Disposition: A | Discharge status: Home / Self Care | Source: Ambulatory Visit | Attending: Radiation Oncology | Admitting: Radiation Oncology

## 2023-10-30 DIAGNOSIS — C3412 Malignant neoplasm of upper lobe, left bronchus or lung: Secondary | ICD-10-CM

## 2023-10-30 DIAGNOSIS — Z87891 Personal history of nicotine dependence: Secondary | ICD-10-CM | POA: Diagnosis not present
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
## 2023-10-31 ENCOUNTER — Telehealth: Payer: Self-pay | Admitting: *Deleted

## 2023-10-31 NOTE — Telephone Encounter (Signed)
 Called patient to inform of Pet Scan for 11-02-23- arrival time- 4:30 pm @ [facility] Radiology, patient to have water  only - 6 hrs. prior to test, patient to hold long acting diabetic medication if he takes it, spoke with patient and he is aware of this scan and the instructions

## 2023-11-01 ENCOUNTER — Encounter: Payer: Self-pay | Admitting: Orthopedic Surgery

## 2023-11-01 ENCOUNTER — Other Ambulatory Visit (INDEPENDENT_AMBULATORY_CARE_PROVIDER_SITE_OTHER): Payer: Self-pay

## 2023-11-01 ENCOUNTER — Other Ambulatory Visit: Payer: Self-pay

## 2023-11-01 ENCOUNTER — Ambulatory Visit: Admitting: Orthopedic Surgery

## 2023-11-01 VITALS — BP 138/81 | HR 99 | Ht 70.0 in | Wt 137.0 lb

## 2023-11-01 DIAGNOSIS — G8929 Other chronic pain: Secondary | ICD-10-CM

## 2023-11-01 DIAGNOSIS — M19011 Primary osteoarthritis, right shoulder: Secondary | ICD-10-CM | POA: Diagnosis not present

## 2023-11-01 DIAGNOSIS — M75102 Unspecified rotator cuff tear or rupture of left shoulder, not specified as traumatic: Secondary | ICD-10-CM

## 2023-11-01 DIAGNOSIS — M12812 Other specific arthropathies, not elsewhere classified, left shoulder: Secondary | ICD-10-CM | POA: Diagnosis not present

## 2023-11-01 MED ORDER — HYDROCODONE-ACETAMINOPHEN 7.5-325 MG PO TABS: 1.0000 | ORAL_TABLET | Freq: Four times a day (QID) | ORAL | 0 refills | Status: AC | PRN

## 2023-11-01 NOTE — Addendum Note (Signed)
 Addended by: VICENTA EMMIE HERO on: 11/01/2023 11:06 AM   Modules accepted: Orders
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
## 2023-11-01 NOTE — Patient Instructions (Signed)

## 2023-11-02 ENCOUNTER — Encounter (HOSPITAL_COMMUNITY)
Admission: RE | Admit: 2023-11-02 | Discharge: 2023-11-02 | Disposition: A | Discharge status: Home / Self Care | Source: Ambulatory Visit | Attending: Radiation Oncology | Admitting: Radiation Oncology

## 2023-11-02 DIAGNOSIS — C3412 Malignant neoplasm of upper lobe, left bronchus or lung: Secondary | ICD-10-CM | POA: Diagnosis not present

## 2023-11-02 MED ORDER — FLUDEOXYGLUCOSE F - 18 (FDG) INJECTION
7.3300 | Freq: Once | INTRAVENOUS | Status: AC | PRN
  Administered 2023-11-02: 7.33 via INTRAVENOUS

## 2023-11-09 ENCOUNTER — Telehealth: Payer: Self-pay | Admitting: Radiation Oncology

## 2023-11-09 NOTE — Telephone Encounter (Signed)
 The patient called me back and we discussed his PET scan and the recommendations for EBUS to try to sample the prevascular node on PET if this is accessible. I reached out to [name], NP at [facility] to see who I need to ask about doing this. The patient is in agreement and is aware this could indicate disease in the nodes which might be treated with additional radiation versus systemic treatment and radiation.

## 2023-11-09 NOTE — Telephone Encounter (Signed)
 I called the patient to review his recent PET scan but he was not available for the call and does not have VM set up. I called his sister [name] listed in his contacts to ask that she let [name] know I'm trying to reach him but similarly could not reach her. I then called [name] his other sister and she will ask the patient to call us . The recommendation will be for evaluation with pulmonary to see if they can sample the prevascular node with EBUS.

## 2023-11-10 DIAGNOSIS — E1129 Type 2 diabetes mellitus with other diabetic kidney complication: Secondary | ICD-10-CM | POA: Diagnosis not present

## 2023-11-10 DIAGNOSIS — Z79899 Other long term (current) drug therapy: Secondary | ICD-10-CM | POA: Diagnosis not present

## 2023-11-13 ENCOUNTER — Ambulatory Visit (INDEPENDENT_AMBULATORY_CARE_PROVIDER_SITE_OTHER): Admitting: Acute Care

## 2023-11-13 ENCOUNTER — Encounter: Payer: Self-pay | Admitting: Emergency Medicine

## 2023-11-13 ENCOUNTER — Telehealth: Payer: Self-pay | Admitting: Acute Care

## 2023-11-13 ENCOUNTER — Encounter: Payer: Self-pay | Admitting: Acute Care

## 2023-11-13 VITALS — BP 94/58 | HR 92 | Temp 97.5°F | Ht 70.0 in | Wt 135.0 lb

## 2023-11-13 DIAGNOSIS — Z720 Tobacco use: Secondary | ICD-10-CM | POA: Diagnosis not present

## 2023-11-13 DIAGNOSIS — R591 Generalized enlarged lymph nodes: Secondary | ICD-10-CM | POA: Diagnosis not present

## 2023-11-13 DIAGNOSIS — R9389 Abnormal findings on diagnostic imaging of other specified body structures: Secondary | ICD-10-CM

## 2023-11-13 DIAGNOSIS — R942 Abnormal results of pulmonary function studies: Secondary | ICD-10-CM | POA: Diagnosis not present

## 2023-11-13 DIAGNOSIS — R911 Solitary pulmonary nodule: Secondary | ICD-10-CM | POA: Diagnosis not present

## 2023-11-13 NOTE — Telephone Encounter (Signed)
 Please schedule the following:  Provider performing procedure: Byrum Diagnosis:  Hypermetabolic pre vascular lymph node Which side if for nodule / mass?  Procedure: Navigational bronchoscopy with EBUS  Has patient been spoken to by Provider and given informed consent?  Yes, to Sean Lites NP on 11/13/2023 Anesthesia:  General Do you need Fluro?  Yes Duration of procedure:  1.5 Date: 11/21/2023 Alternate Date:   Time: 11:30 slot Location:  [facility] Does patient have OSA?  No DM?  Yes Or Latex allergy? No Medication Restriction/ Anticoagulate/Antiplatelet: Hold aspirin  x 2 days prior to procedure Pre-op Labs Ordered:determined by Anesthesia Imaging request:  May need repeat CT Chest. Dr. Shelah will let me know. Last scan was 10/20/2023.   (If, SuperDimension CT Chest, please have STAT courier sent to ENDO)
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
## 2023-11-13 NOTE — Telephone Encounter (Signed)
 Patient has been scheduled and letter was given to patient. Routing to [employer] for [employer].

## 2023-11-13 NOTE — Patient Instructions (Signed)
 It is good to see you today. We have reviewed your PET scan and CT Chest. We discussed that the next step is to biopsy this nodule. I have placed an order for a bronchoscopy with biopsies.  We have discussed the procedure in detail.  We have reviewed the risks and benefits of the procedure. These include bleeding, infection, puncture of the lung, and adverse reaction to anesthesia. You have agreed to proceed with biopsy to evaluate the pre-vascular lymph node Your procedure will be done by Dr. Lamar Chris. You will receive a letter today with date time and information pertaining to the procedure. You may need a repeat CT Chest , if so, we will let you know. We will order this at [facility] You will need someone to drive you to the procedure, stay with you during the procedure, and stay with you after the procedure. You will also need someone to stay with you for 24 hours after anesthesia to ensure you have cleared and are doing well. You will follow-up with me 1 week after the procedure to review the results and to ensure you are doing well. Call if you need us  prior to the procedure or if you have any questions at all. Please contact office for sooner follow up if symptoms do not improve or worsen or seek emergency care
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
## 2023-11-15 DIAGNOSIS — M129 Arthropathy, unspecified: Secondary | ICD-10-CM | POA: Diagnosis not present

## 2023-11-17 DIAGNOSIS — C3400 Malignant neoplasm of unspecified main bronchus: Secondary | ICD-10-CM | POA: Diagnosis not present

## 2023-11-17 DIAGNOSIS — E1129 Type 2 diabetes mellitus with other diabetic kidney complication: Secondary | ICD-10-CM | POA: Diagnosis not present

## 2023-11-17 DIAGNOSIS — I25118 Atherosclerotic heart disease of native coronary artery with other forms of angina pectoris: Secondary | ICD-10-CM | POA: Diagnosis not present

## 2023-11-20 ENCOUNTER — Encounter (HOSPITAL_COMMUNITY): Payer: Self-pay | Admitting: Emergency Medicine

## 2023-11-20 ENCOUNTER — Other Ambulatory Visit: Payer: Self-pay

## 2023-11-20 NOTE — Anesthesia Preprocedure Evaluation (Signed)
 Anesthesia Evaluation  Patient identified by MRN, date of birth, ID band Patient awake    Reviewed: Allergy & Precautions, NPO status , Patient's Chart, lab work & pertinent test results  Airway Mallampati: II  TM Distance: >3 FB Neck ROM: Full    Dental no notable dental hx.    Pulmonary Current Smoker and Patient abstained from smoking., former smoker   Pulmonary exam normal        Cardiovascular hypertension, Pt. on medications + angina  + CAD, + Past MI and + Cardiac Stents (x 1)  Normal cardiovascular exam     Neuro/Psych    GI/Hepatic negative GI ROS, Neg liver ROS,,,  Endo/Other  diabetes    Renal/GU negative Renal ROS     Musculoskeletal   Abdominal   Peds  Hematology negative hematology ROS (+)   Anesthesia Other Findings Lymphadenopathy   Reproductive/Obstetrics                              Anesthesia Physical Anesthesia Plan  ASA: 4  Anesthesia Plan: General   Post-op Pain Management:    Induction: Intravenous  PONV Risk Score and Plan: 2 and Ondansetron , Dexamethasone , Propofol  infusion and Treatment may vary due to age or medical condition  Airway Management Planned: Oral ETT  Additional Equipment:   Intra-op Plan:   Post-operative Plan: Extubation in OR  Informed Consent: I have reviewed the patients History and Physical, chart, labs and discussed the procedure including the risks, benefits and alternatives for the proposed anesthesia with the patient or authorized representative who has indicated his/her understanding and acceptance.     Dental advisory given  Plan Discussed with: CRNA  Anesthesia Plan Comments: (PAT note by Lynwood Hope, PA-C:  73 yo male follows with cardiology for hx of CAD s/p LCx BMS PCI in 2008, severe aortic valve stenosis s/p 23 mm Edwards pericardial valve in 2019 (SAVR). Patient underwent LHC prior to SAVR that demonstrated CTO  of the mid LCx stented segment. There was mild nonobstructive disease in the RCA, LAD and intermediate branches. The mid RCA had a 40% stenosis. He was last seen by Dr. Stacia 03/10/23, stable at that time from cardiac standpoint, 62yr followup recommended.  Echo 09/08/2023 showed LVEF 65 to 70%, normal RV, mild to moderate mitral valve regurgitation, normally functioning bioprosthetic AVR with mean gradient 9 mmHg.  Follows with pulmonology for history of lung cancer.  He is a former smoker.  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 He was referred to radiation oncology and thoracic surgery. His PFT's were a barrier to thoracic surgery. He underwent treatment with 8 UHFX SBRT . Treatment was completed 04/05/2023. He underwent post treatment surveillance imaging 10/20/2023. There was a decrease in the size of the medial left upper lobe mass, however other changes that were concerning for metastatic disease. PET was done 11/02/2023 to further evaluate these findings. There was notation of an enlarging pre-vascular lymph node is moderately hypermetabolic, suspicious for metastatic disease. He was referred back to pulmonary for consideration of navigational bronchoscopy with biopsies of the pre vascular lung nodule.  Follows with vascular surgery for hx of AAA s/p EVAR in 2020. CT abd/pelvis 11/26/22 showed stable endograft repair.   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  EKG 09/08/2023: Marked sinus bradycardia.  Rate 42. T wave  abnormality, consider anterior ischemia.  *EKG was done during admission for colitis; cardiology was consulted and advised, EKG showed T wave inversions in V1-V3. No chest pain. Hs troponin 19. Will obtain echocardiogram to rule out any cardiomyopathy. Otherwise no need of any stress testing or LHC at this time.  TTE 09/08/2023: 1. Left ventricular  ejection fraction, by estimation, is 65 to 70%. The  left ventricle has normal function. The left ventricle has no regional  wall motion abnormalities. There is mild concentric left ventricular  hypertrophy. Left ventricular diastolic  parameters are indeterminate.  2. Right ventricular systolic function is normal. The right ventricular  size is normal. There is normal pulmonary artery systolic pressure. The  estimated right ventricular systolic pressure is 29.6 mmHg.  3. The mitral valve is degenerative. Mild to moderate mitral valve  regurgitation.  4. The aortic valve has been repaired/replaced. Aortic valve  regurgitation is not visualized. There is a 23 mm Edwards pericardial  valve present in the aortic position. Procedure Date: 2019. Aortic valve  mean gradient measures 9.0 mmHg.  5. The inferior vena cava is normal in size with greater than 50%  respiratory variability, suggesting right atrial pressure of 3 mmHg.   Comparison(s): Prior images reviewed side by side. LVEF vigorous at  65-70%. Mild to moderate mitral regurgitation. Stable pericardial AVR with  grossly normal function and mean AV gradient 9 mmHg.   11/02/2023 Restaging PET The enlarging pre-vascular lymph node is moderately hypermetabolic, suspicious for metastatic disease. 2. No other evidence of metastatic disease. 3. The treated left upper lobe nodule demonstrates no metabolic activity. 4. No significant hypermetabolic activity within the questioned enlarging left adrenal nodule. Recommend attention on follow-up. 5. Aortic Atherosclerosis  CT Chest 10/20/2023 Interval decrease in size of a mass in the medial left upper lobe, consistent with treatment response. 2. Redemonstrated mediastinal lymphadenopathy, with interval increase in size of a partially necrotic appearing prevascular lymph node. Other lymph nodes unchanged. 3. Suspect enlargement of a left adrenal nodule, measuring 2.0 x 1.5 cm  previously no greater than 1.3 x 1.0 cm at the time of a PET-CT dated 11/24/2022. 4. PET-CT may be helpful to assess for metabolically active disease. 5. Unchanged moderate pulmonary fibrosis in a pattern with apical to basal gradient featuring irregular peripheral interstitial opacity, septal thickening, traction bronchiectasis, and subpleural bronchiolectasis throughout the bilateral lung bases. 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 6. Moderate underlying emphysema. 7. Coronary artery disease. 8. Cholelithiasis. 9. Nonobstructive left nephrolithiasis. Nuclear stress 02/08/21:   T wave inversion noted in V1 through V3 at baseline and during infusion.   There were no arrhythmias during stress. There were no arrhythmias during recovery.   Diaphragmatic attenuation artifact was present. Image quality affected due to significant extracardiac activity.   LV perfusion is abnormal. There is no evidence of ischemia. There is no evidence of infarction. Defect 1: There is a medium defect with mild reduction in uptake present in the apical to basal inferior location(s) that is fixed. Consistent with artifact caused by bowel tracer uptake and diaphragmatic attenuation.   Left ventricular function is normal. Nuclear stress EF: 61 %. No evidence of transient ischemic dilation (TID) noted.   Findings are consistent with no prior ischemia. The study is low risk.  )         Anesthesia Quick Evaluation

## 2023-11-20 NOTE — Progress Notes (Signed)
 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 Patient underwent LHC prior to SAVR that demonstrated CTO of the mid LCx stented segment. There was mild nonobstructive disease in the RCA, LAD and intermediate branches. The mid RCA had a 40% stenosis. He was last seen by Dr. Stacia 03/10/23, stable at that time from cardiac standpoint, 69yr followup recommended.  Echo 09/08/2023 showed LVEF 65 to 70%, normal RV, mild to moderate mitral valve regurgitation, normally functioning bioprosthetic AVR with mean gradient 9 mmHg.   Follows with pulmonology for history of lung cancer.  He is a former smoker.  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 He was referred to radiation oncology and thoracic surgery. His PFT's were a barrier to thoracic surgery. He underwent treatment with 8 UHFX SBRT . Treatment was completed 04/05/2023. He underwent post treatment surveillance imaging 10/20/2023. There was a decrease in the size of the medial left upper lobe mass, however other changes that were concerning for metastatic disease. PET was done 11/02/2023 to further evaluate these findings. There was notation of an enlarging pre-vascular lymph node is moderately hypermetabolic, suspicious for metastatic disease. He was referred back to pulmonary for consideration of navigational bronchoscopy with biopsies of the pre vascular lung nodule.  Follows with vascular surgery for hx of AAA s/p EVAR in 2020. CT abd/pelvis 11/26/22 showed stable endograft repair.    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   EKG 09/08/2023: Marked sinus bradycardia.  Rate 42. T wave abnormality, consider anterior ischemia.  *EKG was done during admission for colitis; cardiology was  consulted and advised, EKG showed T wave inversions in V1-V3. No chest pain. Hs troponin 19. Will obtain echocardiogram to rule out any cardiomyopathy. Otherwise no need of any stress testing or LHC at this time.  TTE 09/08/2023: 1. Left ventricular ejection fraction, by estimation, is 65 to 70%. The  left ventricle has normal function. The left ventricle has no regional  wall motion abnormalities. There is mild concentric left ventricular  hypertrophy. Left ventricular diastolic  parameters are indeterminate.   2. Right ventricular systolic function is normal. The right ventricular  size is normal. There is normal pulmonary artery systolic pressure. The  estimated right ventricular systolic pressure is 29.6 mmHg.   3. The mitral valve is degenerative. Mild to moderate mitral valve  regurgitation.   4. The aortic valve has been repaired/replaced. Aortic valve  regurgitation is not visualized. There is a 23 mm Edwards pericardial  valve present in the aortic position. Procedure Date: 2019. Aortic valve  mean gradient measures 9.0 mmHg.   5. The inferior vena cava is normal in size with greater than 50%  respiratory variability, suggesting right atrial pressure of 3 mmHg.   Comparison(s): Prior images reviewed side by side. LVEF vigorous at  65-70%. Mild to moderate mitral regurgitation. Stable pericardial AVR with  grossly normal function and mean AV gradient 9 mmHg.   11/02/2023 Restaging PET  The enlarging pre-vascular lymph node is moderately hypermetabolic, suspicious for metastatic disease. 2. No other evidence of metastatic disease. 3. The treated left upper lobe nodule demonstrates no metabolic activity. 4. No significant hypermetabolic activity within the questioned enlarging left adrenal nodule. Recommend attention on follow-up. 5.  Aortic Atherosclerosis   CT Chest 10/20/2023  Interval decrease in size of a mass in the medial left upper lobe, consistent with treatment  response. 2. Redemonstrated mediastinal lymphadenopathy, with interval increase in size of a partially necrotic appearing prevascular lymph node. Other lymph nodes unchanged. 3. Suspect enlargement of a left adrenal nodule, measuring 2.0 x 1.5 cm previously no greater than 1.3 x 1.0 cm at the time of a PET-CT dated 11/24/2022. 4. PET-CT may be helpful to assess for metabolically active disease. 5. Unchanged moderate pulmonary fibrosis in a pattern with apical to basal gradient featuring irregular peripheral interstitial opacity, septal thickening, traction bronchiectasis, and subpleural bronchiolectasis throughout the bilateral lung bases. 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 6. Moderate underlying emphysema. 7. Coronary artery disease. 8. Cholelithiasis. 9. Nonobstructive left nephrolithiasis. Nuclear stress 02/08/21:   T wave inversion noted in V1 through V3 at baseline and during infusion.   There were no arrhythmias during stress. There were no arrhythmias during recovery.   Diaphragmatic attenuation artifact was present. Image quality affected due to significant extracardiac activity.   LV perfusion is abnormal. There is no evidence of ischemia. There is no evidence of infarction. Defect 1: There is a medium defect with mild reduction in uptake present in the apical to basal inferior location(s) that is fixed. Consistent with artifact caused by bowel tracer uptake and diaphragmatic attenuation.   Left ventricular function is normal. Nuclear stress EF: 61 %. No evidence of transient ischemic dilation (TID) noted.   Findings are consistent with no prior ischemia. The study is low risk.    Lynwood Geofm RIGGERS [facility] Short Stay Center/Anesthesiology Phone [phone] 11/20/2023 10:48 AM

## 2023-11-20 NOTE — Progress Notes (Signed)
 PCP - Sheryle Carwin, MD  Cardiologist - Mallipeddi, Diannah SQUIBB, MD (LOV 03-10-23 to follow up in one year)   PPM/ICD - denies Device Orders - n/a Rep Notified - n/a  Chest x-ray - 01-31-23 Chest Ct- 10-20-23 EKG - 09-08-23 Stress Test - 02-08-21 ECHO - 09-08-23 Cardiac Cath - 11-14-16  CPAP - denies  GLP-1 -denies  Dm - per patient he doesn't check blood sugar if it is low he feels weak and when high he feels sleepy. Last A1c on 09-08-23 6.3  Blood Thinner Instructions: denies Aspirin  Instructions: aspirin  Last dose 11-18-23  ERAS Protcol - NPO  COVID TEST- n/a  Anesthesia review: Yes, Hx CAD, DM.  Patient verbally denies any shortness of breath, fever, cough and chest pain during phone call   -------------  SDW INSTRUCTIONS given:  Your procedure is scheduled on November 21, 2023.  Report to [facility] Main Entrance A at 8:45 A.M., and check in at the Admitting office.  Call this number if you have problems the morning of surgery:  [phone]   Remember:  Do not eat or drink after midnight the night before your surgery      Take these medicines the morning of surgery with A SIP OF WATER   HYDROcodone -acetaminophen  (NORCO)  nitroGLYCERIN  (NITROSTAT )  rosuvastatin  (CRESTOR )  tamsulosin  (FLOMAX )   As of today, STOP taking any Aspirin  (unless otherwise instructed by your surgeon) Aleve , Naproxen , Ibuprofen , Motrin , Advil , Goody's, BC's, all herbal medications, fish oil, and all vitamins.  WHAT DO I DO ABOUT MY DIABETES MEDICATION?   Do not take oral diabetes medicines linagliptin  (TRADJENTA ) the morning of surgery.  THE NIGHT BEFORE SURGERY, take ___________ units of ___________insulin.       THE MORNING OF SURGERY, take 10 units of TRESIBA  insulin . (Half of normal dose)  The day of surgery, do not take other diabetes injectables, including Byetta (exenatide), Bydureon (exenatide ER), Victoza (liraglutide), or Trulicity (dulaglutide).  If your CBG is greater than  220 mg/dL, you may take  of your sliding scale (correction) dose of insulin .   HOW TO MANAGE YOUR DIABETES BEFORE AND AFTER SURGERY  Why is it important to control my blood sugar before and after surgery? Improving blood sugar levels before and after surgery helps healing and can limit problems. A way of improving blood sugar control is eating a healthy diet by:  Eating less sugar and carbohydrates  Increasing activity/exercise  Talking with your doctor about reaching your blood sugar goals High blood sugars (greater than 180 mg/dL) can raise your risk of infections and slow your recovery, so you will need to focus on controlling your diabetes during the weeks before surgery. Make sure that the doctor who takes care of your diabetes knows about your planned surgery including the date and location.  How do I manage my blood sugar before surgery? Check your blood sugar at least 4 times a day, starting 2 days before surgery, to make sure that the level is not too high or low.  Check your blood sugar the morning of your surgery when you wake up and every 2 hours until you get to the Short Stay unit.  If your blood sugar is less than 70 mg/dL, you will need to treat for low blood sugar: Do not take insulin . Treat a low blood sugar (less than 70 mg/dL) with  cup of clear juice (cranberry or apple), 4 glucose tablets, OR glucose gel. Recheck blood sugar in 15 minutes after treatment (to make sure it  is greater than 70 mg/dL). If your blood sugar is not greater than 70 mg/dL on recheck, call [phone] for further instructions. Report your blood sugar to the short stay nurse when you get to Short Stay.  If you are admitted to the hospital after surgery: Your blood sugar will be checked by the staff and you will probably be given insulin  after surgery (instead of oral diabetes medicines) to make sure you have good blood sugar levels. The goal for blood sugar control after surgery is 80-180  mg/dL.                      Do not wear jewelry, make up, or nail polish            Do not wear lotions, powders, perfumes/colognes, or deodorant.            Do not shave 48 hours prior to surgery.  Men may shave face and neck.            Do not bring valuables to the hospital.            [facility] is not responsible for any belongings or valuables.  Do NOT Smoke (Tobacco/Vaping) 24 hours prior to your procedure If you use a CPAP at night, you may bring all equipment for your overnight stay.   Contacts, glasses, dentures or bridgework may not be worn into surgery.      For patients admitted to the hospital, discharge time will be determined by your treatment team.   Patients discharged the day of surgery will not be allowed to drive home, and someone needs to stay with them for 24 hours.    Special instructions:   [facility]- Preparing For Surgery  Before surgery, you can play an important role. Because skin is not sterile, your skin needs to be as free of germs as possible. You can reduce the number of germs on your skin by washing with CHG (chlorahexidine gluconate) Soap before surgery.  CHG is an antiseptic cleaner which kills germs and bonds with the skin to continue killing germs even after washing.    Oral Hygiene is also important to reduce your risk of infection.  Remember - BRUSH YOUR TEETH THE MORNING OF SURGERY WITH YOUR REGULAR TOOTHPASTE  Please do not use if you have an allergy to CHG or antibacterial soaps. If your skin becomes reddened/irritated stop using the CHG.  Do not shave (including legs and underarms) for at least 48 hours prior to first CHG shower. It is OK to shave your face.  Please follow these instructions carefully.   Shower the NIGHT BEFORE SURGERY and the MORNING OF SURGERY with DIAL Soap.   Pat yourself dry with a CLEAN TOWEL.  Wear CLEAN PAJAMAS to bed the night before surgery  Place CLEAN SHEETS on your bed the night of your first shower and  DO NOT SLEEP WITH PETS.   Day of Surgery: Please shower morning of surgery  Wear Clean/Comfortable clothing the morning of surgery Do not apply any deodorants/lotions.   Remember to brush your teeth WITH YOUR REGULAR TOOTHPASTE.   Questions were answered. Patient verbalized understanding of instructions.

## 2023-11-21 ENCOUNTER — Ambulatory Visit (HOSPITAL_COMMUNITY)

## 2023-11-21 ENCOUNTER — Encounter (HOSPITAL_COMMUNITY)
Admission: RE | Disposition: A | Discharge status: Home / Self Care | Payer: Self-pay | Source: Home / Self Care | Attending: Emergency Medicine

## 2023-11-21 ENCOUNTER — Encounter (HOSPITAL_COMMUNITY): Payer: Self-pay | Admitting: Emergency Medicine

## 2023-11-21 ENCOUNTER — Ambulatory Visit (HOSPITAL_COMMUNITY)
Admission: RE | Admit: 2023-11-21 | Discharge: 2023-11-21 | Disposition: A | Discharge status: Home / Self Care | Attending: Emergency Medicine | Admitting: Emergency Medicine

## 2023-11-21 ENCOUNTER — Ambulatory Visit (HOSPITAL_BASED_OUTPATIENT_CLINIC_OR_DEPARTMENT_OTHER): Payer: Self-pay | Admitting: Physician Assistant

## 2023-11-21 ENCOUNTER — Encounter (HOSPITAL_COMMUNITY): Payer: Self-pay | Admitting: Physician Assistant

## 2023-11-21 DIAGNOSIS — I25119 Atherosclerotic heart disease of native coronary artery with unspecified angina pectoris: Secondary | ICD-10-CM | POA: Diagnosis not present

## 2023-11-21 DIAGNOSIS — J479 Bronchiectasis, uncomplicated: Secondary | ICD-10-CM | POA: Insufficient documentation

## 2023-11-21 DIAGNOSIS — J432 Centrilobular emphysema: Secondary | ICD-10-CM | POA: Diagnosis not present

## 2023-11-21 DIAGNOSIS — R590 Localized enlarged lymph nodes: Secondary | ICD-10-CM | POA: Diagnosis not present

## 2023-11-21 DIAGNOSIS — Z955 Presence of coronary angioplasty implant and graft: Secondary | ICD-10-CM | POA: Diagnosis not present

## 2023-11-21 DIAGNOSIS — R591 Generalized enlarged lymph nodes: Secondary | ICD-10-CM

## 2023-11-21 DIAGNOSIS — C3412 Malignant neoplasm of upper lobe, left bronchus or lung: Secondary | ICD-10-CM | POA: Diagnosis present

## 2023-11-21 DIAGNOSIS — I252 Old myocardial infarction: Secondary | ICD-10-CM | POA: Insufficient documentation

## 2023-11-21 DIAGNOSIS — Z923 Personal history of irradiation: Secondary | ICD-10-CM | POA: Diagnosis not present

## 2023-11-21 DIAGNOSIS — Z7984 Long term (current) use of oral hypoglycemic drugs: Secondary | ICD-10-CM | POA: Diagnosis not present

## 2023-11-21 DIAGNOSIS — I10 Essential (primary) hypertension: Secondary | ICD-10-CM | POA: Insufficient documentation

## 2023-11-21 DIAGNOSIS — Z87891 Personal history of nicotine dependence: Secondary | ICD-10-CM | POA: Diagnosis not present

## 2023-11-21 DIAGNOSIS — J8410 Pulmonary fibrosis, unspecified: Secondary | ICD-10-CM | POA: Insufficient documentation

## 2023-11-21 DIAGNOSIS — I700 Atherosclerosis of aorta: Secondary | ICD-10-CM | POA: Diagnosis not present

## 2023-11-21 DIAGNOSIS — I6523 Occlusion and stenosis of bilateral carotid arteries: Secondary | ICD-10-CM | POA: Diagnosis not present

## 2023-11-21 DIAGNOSIS — Z85118 Personal history of other malignant neoplasm of bronchus and lung: Secondary | ICD-10-CM | POA: Diagnosis not present

## 2023-11-21 DIAGNOSIS — J438 Other emphysema: Secondary | ICD-10-CM | POA: Diagnosis not present

## 2023-11-21 DIAGNOSIS — Z7982 Long term (current) use of aspirin: Secondary | ICD-10-CM | POA: Insufficient documentation

## 2023-11-21 DIAGNOSIS — Z952 Presence of prosthetic heart valve: Secondary | ICD-10-CM | POA: Diagnosis not present

## 2023-11-21 DIAGNOSIS — I2510 Atherosclerotic heart disease of native coronary artery without angina pectoris: Secondary | ICD-10-CM | POA: Diagnosis not present

## 2023-11-21 DIAGNOSIS — Z951 Presence of aortocoronary bypass graft: Secondary | ICD-10-CM | POA: Insufficient documentation

## 2023-11-21 DIAGNOSIS — E785 Hyperlipidemia, unspecified: Secondary | ICD-10-CM | POA: Diagnosis not present

## 2023-11-21 DIAGNOSIS — Z79899 Other long term (current) drug therapy: Secondary | ICD-10-CM | POA: Diagnosis not present

## 2023-11-21 DIAGNOSIS — Z48813 Encounter for surgical aftercare following surgery on the respiratory system: Secondary | ICD-10-CM | POA: Diagnosis not present

## 2023-11-21 DIAGNOSIS — I898 Other specified noninfective disorders of lymphatic vessels and lymph nodes: Secondary | ICD-10-CM | POA: Diagnosis not present

## 2023-11-21 DIAGNOSIS — F17210 Nicotine dependence, cigarettes, uncomplicated: Secondary | ICD-10-CM | POA: Diagnosis not present

## 2023-11-21 DIAGNOSIS — Z794 Long term (current) use of insulin: Secondary | ICD-10-CM | POA: Diagnosis not present

## 2023-11-21 DIAGNOSIS — E119 Type 2 diabetes mellitus without complications: Secondary | ICD-10-CM | POA: Insufficient documentation

## 2023-11-21 DIAGNOSIS — Z95828 Presence of other vascular implants and grafts: Secondary | ICD-10-CM | POA: Diagnosis not present

## 2023-11-21 HISTORY — PX: ENDOBRONCHIAL ULTRASOUND: SHX5096

## 2023-11-21 HISTORY — DX: Malignant (primary) neoplasm, unspecified: C80.1

## 2023-11-21 HISTORY — PX: VIDEO BRONCHOSCOPY WITH ENDOBRONCHIAL NAVIGATION: SHX6175

## 2023-11-21 LAB — POCT I-STAT, CHEM 8
BUN: 12 mg/dL (ref 8–23)
Calcium, Ion: 1.02 mmol/L — ABNORMAL LOW (ref 1.15–1.40)
Chloride: 109 mmol/L (ref 98–111)
Creatinine, Ser: 0.8 mg/dL (ref 0.61–1.24)
Glucose, Bld: 74 mg/dL (ref 70–99)
HCT: 42 % (ref 39.0–52.0)
Hemoglobin: 14.3 g/dL (ref 13.0–17.0)
Potassium: 4.2 mmol/L (ref 3.5–5.1)
Sodium: 139 mmol/L (ref 135–145)
TCO2: 22 mmol/L (ref 22–32)

## 2023-11-21 LAB — GLUCOSE, CAPILLARY
Glucose-Capillary: 159 mg/dL — ABNORMAL HIGH (ref 70–99)
Glucose-Capillary: 67 mg/dL — ABNORMAL LOW (ref 70–99)
Glucose-Capillary: 117 mg/dL — ABNORMAL HIGH (ref 70–99)

## 2023-11-21 SURGERY — VIDEO BRONCHOSCOPY WITH ENDOBRONCHIAL NAVIGATION
Anesthesia: General

## 2023-11-21 MED ORDER — FENTANYL CITRATE (PF) 100 MCG/2ML IJ SOLN: 25.0000 ug | INTRAMUSCULAR | Status: DC | PRN

## 2023-11-21 MED ORDER — FENTANYL CITRATE (PF) 100 MCG/2ML IJ SOLN
INTRAMUSCULAR | Status: AC
  Filled 2023-11-21: qty 2

## 2023-11-21 MED ORDER — AMISULPRIDE (ANTIEMETIC) 5 MG/2ML IV SOLN: 10.0000 mg | Freq: Once | INTRAVENOUS | Status: DC | PRN

## 2023-11-21 MED ORDER — PROPOFOL 500 MG/50ML IV EMUL
INTRAVENOUS | Status: DC | PRN
Start: 2023-11-21 — End: 2023-11-21
  Administered 2023-11-21: 66.5 ug/kg/min via INTRAVENOUS

## 2023-11-21 MED ORDER — FENTANYL CITRATE (PF) 250 MCG/5ML IJ SOLN
INTRAMUSCULAR | Status: DC | PRN
  Administered 2023-11-21: 100 ug via INTRAVENOUS

## 2023-11-21 MED ORDER — ONDANSETRON HCL 4 MG/2ML IJ SOLN: 4.0000 mg | Freq: Once | INTRAMUSCULAR | Status: DC | PRN

## 2023-11-21 MED ORDER — ROCURONIUM BROMIDE 10 MG/ML (PF) SYRINGE
PREFILLED_SYRINGE | INTRAVENOUS | Status: DC | PRN
  Administered 2023-11-21: 50 mg via INTRAVENOUS
  Administered 2023-11-21: 10 mg via INTRAVENOUS

## 2023-11-21 MED ORDER — DEXAMETHASONE SOD PHOSPHATE PF 10 MG/ML IJ SOLN
INTRAMUSCULAR | Status: DC | PRN
  Administered 2023-11-21: 10 mg via INTRAVENOUS

## 2023-11-21 MED ORDER — DEXTROSE 50 % IV SOLN: 12.5000 g | INTRAVENOUS | Status: AC

## 2023-11-21 MED ORDER — SUGAMMADEX SODIUM 200 MG/2ML IV SOLN
INTRAVENOUS | Status: DC | PRN
  Administered 2023-11-21: 200 mg via INTRAVENOUS

## 2023-11-21 MED ORDER — PHENYLEPHRINE 80 MCG/ML (10ML) SYRINGE FOR IV PUSH (FOR BLOOD PRESSURE SUPPORT)
PREFILLED_SYRINGE | INTRAVENOUS | Status: DC | PRN
  Administered 2023-11-21: 80 ug via INTRAVENOUS

## 2023-11-21 MED ORDER — LIDOCAINE 2% (20 MG/ML) 5 ML SYRINGE
INTRAMUSCULAR | Status: DC | PRN
  Administered 2023-11-21: 60 mg via INTRAVENOUS

## 2023-11-21 MED ORDER — DEXTROSE 50 % IV SOLN
INTRAVENOUS | Status: AC
  Administered 2023-11-21: 25 mL via INTRAVENOUS
  Filled 2023-11-21: qty 50

## 2023-11-21 MED ORDER — ONDANSETRON HCL 4 MG/2ML IJ SOLN
INTRAMUSCULAR | Status: DC | PRN
Start: 2023-11-21 — End: 2023-11-21
  Administered 2023-11-21: 4 mg via INTRAVENOUS

## 2023-11-21 MED ORDER — ACETAMINOPHEN 10 MG/ML IV SOLN: 1000.0000 mg | Freq: Once | INTRAVENOUS | Status: DC | PRN

## 2023-11-21 MED ORDER — CHLORHEXIDINE GLUCONATE 0.12 % MT SOLN
OROMUCOSAL | Status: AC
  Administered 2023-11-21: 15 mL
  Filled 2023-11-21: qty 15

## 2023-11-21 MED ORDER — EPHEDRINE SULFATE-NACL 50-0.9 MG/10ML-% IV SOSY
PREFILLED_SYRINGE | INTRAVENOUS | Status: DC | PRN
  Administered 2023-11-21: 7.5 mg via INTRAVENOUS

## 2023-11-21 MED ORDER — LACTATED RINGERS IV SOLN: INTRAVENOUS | Status: DC

## 2023-11-21 MED ORDER — PHENYLEPHRINE HCL-NACL 20-0.9 MG/250ML-% IV SOLN
INTRAVENOUS | Status: DC | PRN
Start: 2023-11-21 — End: 2023-11-21
  Administered 2023-11-21: 30 ug/min via INTRAVENOUS

## 2023-11-21 MED ORDER — PROPOFOL 10 MG/ML IV BOLUS
INTRAVENOUS | Status: DC | PRN
  Administered 2023-11-21: 100 mg via INTRAVENOUS

## 2023-11-21 SURGICAL SUPPLY — 36 items
ADAPTER BRONCHOSCOPE OLYMPUS (ADAPTER) ×2 IMPLANT
ADAPTER VALVE BIOPSY EBUS (MISCELLANEOUS) IMPLANT
BAG COUNTER SPONGE SURGICOUNT (BAG) ×2 IMPLANT
BRUSH CYTOL CELLEBRITY 1.5X140 (MISCELLANEOUS) ×2 IMPLANT
BRUSH SUPERTRAX BIOPSY (INSTRUMENTS) IMPLANT
BRUSH SUPERTRAX NDL-TIP CYTO (INSTRUMENTS) ×2 IMPLANT
CANISTER SUCTION 3000ML PPV (SUCTIONS) ×2 IMPLANT
CNTNR URN SCR LID CUP LEK RST (MISCELLANEOUS) ×2 IMPLANT
COVER BACK TABLE 60X90IN (DRAPES) ×2 IMPLANT
FILTER STRAW FLUID ASPIR (MISCELLANEOUS) IMPLANT
FORCEPS BIOP 1.5 SINGLE USE (MISCELLANEOUS) ×2 IMPLANT
FORCEPS BIOP SUPERTRX PREMAR (INSTRUMENTS) ×2 IMPLANT
GAUZE SPONGE 4X4 12PLY STRL (GAUZE/BANDAGES/DRESSINGS) ×2 IMPLANT
GLOVE BIO SURGEON STRL SZ7.5 (GLOVE) ×4 IMPLANT
GOWN STRL REUS W/ TWL LRG LVL3 (GOWN DISPOSABLE) ×4 IMPLANT
KIT CLEAN ENDO COMPLIANCE (KITS) ×2 IMPLANT
KIT LOCATABLE GUIDE (CANNULA) IMPLANT
KIT MARKER FIDUCIAL DELIVERY (KITS) IMPLANT
KIT TURNOVER KIT B (KITS) ×2 IMPLANT
MARKER SKIN DUAL TIP RULER LAB (MISCELLANEOUS) ×2 IMPLANT
NDL SUPERTRX PREMARK BIOPSY (NEEDLE) ×2 IMPLANT
NEEDLE SUPERTRX PREMARK BIOPSY (NEEDLE) ×1 IMPLANT
OIL SILICONE PENTAX (PARTS (SERVICE/REPAIRS)) ×2 IMPLANT
PAD ARMBOARD POSITIONER FOAM (MISCELLANEOUS) ×4 IMPLANT
PATCHES PATIENT (LABEL) ×6 IMPLANT
SOLN 0.9% NACL POUR BTL 1000ML (IV SOLUTION) ×2 IMPLANT
SOLN STERILE WATER BTL 1000 ML (IV SOLUTION) ×2 IMPLANT
SYR 20ML ECCENTRIC (SYRINGE) ×2 IMPLANT
SYR 20ML LL LF (SYRINGE) ×2 IMPLANT
SYR 50ML SLIP (SYRINGE) ×2 IMPLANT
TOWEL GREEN STERILE FF (TOWEL DISPOSABLE) ×2 IMPLANT
TRAP SPECIMEN MUCUS 40CC (MISCELLANEOUS) IMPLANT
TUBE CONNECTING 20X1/4 (TUBING) ×2 IMPLANT
UNDERPAD 30X36 HEAVY ABSORB (UNDERPADS AND DIAPERS) ×2 IMPLANT
VALVE BIOPSY SINGLE USE (MISCELLANEOUS) ×2 IMPLANT
VALVE SUCTION BRONCHIO DISP (MISCELLANEOUS) ×2 IMPLANT

## 2023-11-21 NOTE — Discharge Instructions (Addendum)
 Flexible Bronchoscopy, Care After This sheet gives you information about how to care for yourself after your test. Your doctor may also give you more specific instructions. If you have problems or questions, contact your doctor. Follow these instructions at home: Eating and drinking When you are wide awake, your numbness is gone and your cough and gag reflexes have come back, you may: Start eating only soft foods. Slowly drink liquids. Six hours after the test, go back to your normal diet. Driving Do not drive for 24 hours if you were given a medicine to help you relax (sedative). Do not drive or use heavy machinery while taking prescription pain medicine. General instructions Take over-the-counter and prescription medicines only as told by your doctor. Return to your normal activities as told. Ask what activities are safe for you. Do not use any products that have nicotine or tobacco in them. This includes cigarettes and e-cigarettes. If you need help quitting, ask your doctor. Keep all follow-up visits as told by your doctor. This is important. It is very important if you had a tissue sample (biopsy) taken. Get help right away if: You have shortness of breath that gets worse. You get light-headed. You feel like you are going to pass out (faint). You have chest pain. You cough up: More than a little blood. More blood than before. Summary Do not use cigarettes. Do not use e-cigarettes. Seek care in the Emergency Department right away if you have chest pain or shortness of breath. Call or MyChart Message our office for any questions or problems at [phone].  OK to restart Aspirin  on 11/22/2023.   This information is not intended to replace advice given to you by your health care provider. Make sure you discuss any questions you have with your health care provider.

## 2023-11-21 NOTE — Transfer of Care (Signed)
 Immediate Anesthesia Transfer of Care Note  Patient: [name]  Procedure(s) Performed: VIDEO BRONCHOSCOPY WITH ENDOBRONCHIAL NAVIGATION ENDOBRONCHIAL ULTRASOUND (EBUS)  Patient Location: PACU  Anesthesia Type:General  Level of Consciousness: awake, alert , and oriented  Airway & Oxygen Therapy: Patient Spontanous Breathing and Patient connected to face mask oxygen  Post-op Assessment: Report given to RN and Post -op Vital signs reviewed and stable  Post vital signs: Reviewed and stable  Last Vitals:  Vitals Value Taken Time  BP 111/62 11/21/23 11:05  Temp    Pulse 62 11/21/23 11:09  Resp 16 11/21/23 11:09  SpO2 93 % 11/21/23 11:09  Vitals shown include unfiled device data.  Last Pain:  Vitals:   11/21/23 0902  TempSrc:   PainSc: 0-No pain         Complications: No notable events documented.

## 2023-11-21 NOTE — Interval H&P Note (Signed)
 History and Physical Interval Note:  11/21/2023 9:26 AM  [name]  has presented today for surgery, with the diagnosis of prevascular lymph node.  The various methods of treatment have been discussed with the patient and family. After consideration of risks, benefits and other options for treatment, the patient has consented to  Procedure(s) with comments: VIDEO BRONCHOSCOPY WITH ENDOBRONCHIAL NAVIGATION (N/A) - Pre vascular lymph node ENDOBRONCHIAL ULTRASOUND (EBUS) (N/A) as a surgical intervention.  The patient's history has been reviewed, patient examined, no change in status, stable for surgery.  I have reviewed the patient's chart and labs.  Questions were answered to the patient's satisfaction.     Lamar GORMAN Chris

## 2023-11-21 NOTE — Op Note (Signed)
 Procedure Note  Patient: [name]  [employer] Cios mobile C-arm was utilized to identify and biopsy station 6L lymph node.  Needle-in-lesion was confirmed using real-time Cios imaging, and images were uploaded to PACS.     Station 6L Node, Needle Bx  Lamar Chris, MD, PhD 11/21/2023, 11:02 AM [facility] [phone] or if no answer before 7:00PM call [phone] For any issues after 7:00PM please call eLink [phone]

## 2023-11-21 NOTE — Anesthesia Procedure Notes (Signed)
 Procedure Name: Intubation Date/Time: 11/21/2023 9:43 AM  Performed by: Vera Rochele PARAS, CRNAPre-anesthesia Checklist: Patient identified, Emergency Drugs available, Suction available and Patient being monitored Patient Re-evaluated:Patient Re-evaluated prior to induction Oxygen Delivery Method: Circle System Utilized Preoxygenation: Pre-oxygenation with 100% oxygen Induction Type: IV induction Ventilation: Mask ventilation without difficulty Laryngoscope Size: Miller and 3 Grade View: Grade I Tube type: Oral Tube size: 8.5 mm Number of attempts: 1 Airway Equipment and Method: Stylet and Oral airway Placement Confirmation: ETT inserted through vocal cords under direct vision, positive ETCO2 and breath sounds checked- equal and bilateral Secured at: 23 cm Tube secured with: Tape Dental Injury: Teeth and Oropharynx as per pre-operative assessment

## 2023-11-21 NOTE — Op Note (Signed)
 Video Bronchoscopy with Endobronchial Ultrasound and Electromagnetic Navigation Procedure Note  Date of Operation: 11/21/2023  Pre-op Diagnosis: Prevascular and mediastinal adenopathy  Post-op Diagnosis: Same  Surgeon: LAMAR CHRIS  Assistants: None  Anesthesia: General endotracheal anesthesia  Operation: Flexible video fiberoptic bronchoscopy with endobronchial ultrasound, robotic assisted navigation and biopsies.  Estimated Blood Loss: Minimal  Complications: None apparent  Indications and History: [name] is a 73 y.o. 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  Surveillance imaging revealed mediastinal adenopathy, prevascular adenopathy.  The prevascular region showed hypermetabolism on PET scan.  Recommendation was made to achieve a tissue diagnosis using endobronchial ultrasound and robotic assisted navigational bronchoscopy. The risks, benefits, complications, treatment options and expected outcomes were discussed with the patient.  The possibilities of pneumothorax, pneumonia, reaction to medication, pulmonary aspiration, perforation of a viscus, bleeding, failure to diagnose a condition and creating a complication requiring transfusion or operation were discussed with the patient who freely signed the consent.    Description of Procedure: The patient was seen in the Preoperative Area, was examined and was deemed appropriate to proceed.  The patient was taken to [facility] Endoscopy room 3, identified as [name] and the procedure verified as Flexible Video Fiberoptic Bronchoscopy with robotic assisted navigation and endobronchial ultrasound.  A Time Out was held and the above information confirmed.   Endobronchial ultrasound: The video fiberoptic bronchoscope was introduced via the endotracheal tube and a general inspection was performed which showed normal right and left lung anatomy. Aspiration of the bilateral mainstems was completed to remove any  remaining secretions. The standard scope was then withdrawn and the endobronchial ultrasound was used to identify and characterize the peritracheal, hilar and bronchial lymph nodes. Inspection showed significant enlargement of nodes at station 4L, 7, 4R, 11R. Using real-time ultrasound guidance Wang needle biopsies were take from Station 4L, 7, 4R, 11R nodes and were sent for cytology.  The preliminary Quik stain information on these nodes did not show any clear evidence for malignancy and so we proceeded to the robotic assisted navigation to access station 6L.  Robotic assisted navigation: Prior to the date of the procedure a high-resolution CT scan of the chest was performed. Utilizing [employer] program a virtual tracheobronchial tree was generated to allow the creation of distinct navigation pathways to the patient's 6L node. Robotic catheter inserted into patient's endotracheal tube.   Target #1 Station 6L node: The distinct navigation pathways prepared prior to this procedure were then utilized to navigate to patient's lesion identified on CT scan. The robotic catheter was secured into place and the vision probe was withdrawn.  Lesion location was approximated using fluoroscopy.  Local registration and targeting was performed using [employer] Cios mobile C-arm three-dimensional imaging. Under fluoroscopic guidance transbronchial needle biopsies were performed to be sent for cytology and pathology.  Needle-in-lesion was confirmed using Cios mobile C-arm.    At the end of the procedure a general airway inspection was performed and there was no evidence of active bleeding. The bronchoscope was removed.  The patient tolerated the procedure well. There was no significant blood loss and there were no obvious complications. A post-procedural chest x-ray is pending.   EBUS Samples: 1. Wang needle biopsies from 4L node 2. Wang needle biopsies from 7 node 3. Wang needle biopsies from 4R  node 4. Wang needle biopsies from 11R node  Navigation Samples Target #1: 1. Transbronchial Wang needle biopsies from 6L node   Station 6L Node  Lamar Chris, MD, PhD 11/21/2023, 10:52 AM [facility]

## 2023-11-22 LAB — CYTOLOGY - NON PAP

## 2023-11-22 NOTE — Anesthesia Postprocedure Evaluation (Signed)
 Anesthesia Post Note  Patient: [name]  Procedure(s) Performed: VIDEO BRONCHOSCOPY WITH ENDOBRONCHIAL NAVIGATION ENDOBRONCHIAL ULTRASOUND (EBUS)     Patient location during evaluation: PACU Anesthesia Type: General Level of consciousness: awake Pain management: pain level controlled Vital Signs Assessment: post-procedure vital signs reviewed and stable Respiratory status: spontaneous breathing, nonlabored ventilation and respiratory function stable Cardiovascular status: blood pressure returned to baseline and stable Postop Assessment: no apparent nausea or vomiting Anesthetic complications: no   No notable events documented.  Last Vitals:  Vitals:   11/21/23 1130 11/21/23 1134  BP: (!) 102/58 98/61  Pulse: 61 64  Resp: 11 13  Temp:  36.5 C  SpO2: 93% 94%    Last Pain:  Vitals:   11/21/23 1130  TempSrc:   PainSc: 0-No pain                 Nautica Hotz P Derrick Orris

## 2023-11-23 LAB — CYTOLOGY - NON PAP

## 2023-11-28 ENCOUNTER — Encounter: Payer: Self-pay | Admitting: Acute Care

## 2023-11-28 ENCOUNTER — Ambulatory Visit: Admitting: Acute Care

## 2023-11-28 VITALS — BP 106/69 | HR 87 | Temp 98.1°F | Ht 70.0 in | Wt 137.0 lb

## 2023-11-28 DIAGNOSIS — Z9889 Other specified postprocedural states: Secondary | ICD-10-CM

## 2023-11-28 DIAGNOSIS — Z87891 Personal history of nicotine dependence: Secondary | ICD-10-CM

## 2023-11-28 DIAGNOSIS — R9389 Abnormal findings on diagnostic imaging of other specified body structures: Secondary | ICD-10-CM

## 2023-11-28 DIAGNOSIS — C3490 Malignant neoplasm of unspecified part of unspecified bronchus or lung: Secondary | ICD-10-CM

## 2023-11-28 DIAGNOSIS — C3492 Malignant neoplasm of unspecified part of left bronchus or lung: Secondary | ICD-10-CM | POA: Diagnosis not present

## 2023-11-28 DIAGNOSIS — R911 Solitary pulmonary nodule: Secondary | ICD-10-CM | POA: Diagnosis not present

## 2023-11-28 DIAGNOSIS — J441 Chronic obstructive pulmonary disease with (acute) exacerbation: Secondary | ICD-10-CM

## 2023-11-28 DIAGNOSIS — R942 Abnormal results of pulmonary function studies: Secondary | ICD-10-CM | POA: Diagnosis not present

## 2023-11-28 MED ORDER — DOXYCYCLINE HYCLATE 100 MG PO TABS: 100.0000 mg | ORAL_TABLET | Freq: Two times a day (BID) | ORAL | 0 refills | Status: AC

## 2023-11-28 MED ORDER — PREDNISONE 10 MG PO TABS: ORAL_TABLET | ORAL | 0 refills | Status: AC

## 2023-11-28 NOTE — Patient Instructions (Addendum)
 It is good to see you today. I am glad you have done well after the biopsy. We have reviewed your biopsy results. 4 of the lymph nodes were negative for malignancy, but the 6 L node Rare atypical large cells suspicious for tumor. I will send you back to radiation oncology and medical oncology. You will get calls to schedule your visits.  I have ordered an MR Brain to complete staging. You will get a call to get this scheduled.  Work on eating better, to help gain some of your weight back. We will send in an antibiotic for your upper respiratory infection  Doxycycline  100 mg BID x 7 days . Take one tablet in the morning and one tablet in the evening x 7 days.  Prednisone taper; 10 mg tablets: 4 tabs x 2 days, 3 tabs x 2 days, 2 tabs x 2 days 1 tab x 2 days then stop.  Take with a full glass of water . Wear sunblock if out in the sun as this drug will make you sun sensitive.  Call to be seen or seek emergency care if you have blood in your sputum.  Follow up as needed if you do not improve with antibiotics, call to be seen. Please contact office for sooner follow up if symptoms do not improve or worsen or seek emergency care
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
## 2023-11-29 ENCOUNTER — Telehealth: Payer: Self-pay | Admitting: Radiation Oncology

## 2023-11-29 NOTE — Telephone Encounter (Signed)
 error

## 2023-11-29 NOTE — Telephone Encounter (Signed)
 11/12 @ 1:24 pm Follow up call to patient, still not available to be sch for consult.  Patient requested to call back at later time.

## 2023-11-29 NOTE — Telephone Encounter (Signed)
 11/12 @ 9:41 pm patient unavailable at this time would like a called back today after 12 pm to be sch for follow up consult.

## 2023-11-30 ENCOUNTER — Inpatient Hospital Stay

## 2023-11-30 ENCOUNTER — Inpatient Hospital Stay: Attending: Oncology | Admitting: Oncology

## 2023-11-30 VITALS — BP 108/71 | HR 85 | Temp 97.7°F | Resp 19 | Wt 131.0 lb

## 2023-11-30 DIAGNOSIS — R599 Enlarged lymph nodes, unspecified: Secondary | ICD-10-CM | POA: Diagnosis not present

## 2023-11-30 DIAGNOSIS — F1290 Cannabis use, unspecified, uncomplicated: Secondary | ICD-10-CM | POA: Diagnosis not present

## 2023-11-30 DIAGNOSIS — Z79899 Other long term (current) drug therapy: Secondary | ICD-10-CM | POA: Insufficient documentation

## 2023-11-30 DIAGNOSIS — Z809 Family history of malignant neoplasm, unspecified: Secondary | ICD-10-CM | POA: Diagnosis not present

## 2023-11-30 DIAGNOSIS — Z87891 Personal history of nicotine dependence: Secondary | ICD-10-CM | POA: Insufficient documentation

## 2023-11-30 DIAGNOSIS — I10 Essential (primary) hypertension: Secondary | ICD-10-CM | POA: Insufficient documentation

## 2023-11-30 DIAGNOSIS — E1159 Type 2 diabetes mellitus with other circulatory complications: Secondary | ICD-10-CM | POA: Diagnosis not present

## 2023-11-30 DIAGNOSIS — R059 Cough, unspecified: Secondary | ICD-10-CM | POA: Insufficient documentation

## 2023-11-30 DIAGNOSIS — C3412 Malignant neoplasm of upper lobe, left bronchus or lung: Secondary | ICD-10-CM | POA: Diagnosis not present

## 2023-11-30 DIAGNOSIS — Z89612 Acquired absence of left leg above knee: Secondary | ICD-10-CM | POA: Diagnosis not present

## 2023-11-30 DIAGNOSIS — R112 Nausea with vomiting, unspecified: Secondary | ICD-10-CM | POA: Insufficient documentation

## 2023-11-30 DIAGNOSIS — Z923 Personal history of irradiation: Secondary | ICD-10-CM | POA: Diagnosis not present

## 2023-11-30 DIAGNOSIS — R062 Wheezing: Secondary | ICD-10-CM | POA: Diagnosis not present
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
## 2023-11-30 NOTE — Addendum Note (Signed)
 Addended by: Orvil Faraone on: 11/30/2023 10:02 PM   Modules accepted: Level of Service

## 2023-11-30 NOTE — Patient Instructions (Addendum)
 [facility]  Discharge Instructions  You were seen and examined today by Dr. Davonna. Dr. Davonna is a medical oncologist, meaning that she specializes in the treatment of cancer diagnoses. Dr. Davonna discussed your past medical history, family history of cancers, and the events that led to you being here today.  You were referred to Dr. Davonna due to a new diagnosis of lung cancer recurrence. What this means is that your previous lung cancer has returned. This time it is present in a lymph node on the same side as your original cancer.  Dr. Davonna has discussed two options: Radiation Only. Dr. Dewey would radiate the one area of concern. Radiation and Chemotherapy concurrently. This would mean daily radiation at [facility] and weekly chemotherapy. This would also be followed be one full year of immunotherapy to cure the cancer and prevent cancer recurrence.  Both options are comparable for how well they work. Despite the option you choose, you will need routine monitoring with CT scans of your lungs every 3 months.  Keep your appointment as scheduled with Donald Husband to discuss radiation on November 19th. You should also keep your appointment on November 20th for your brain MRI.  We will contact you regarding follow-up.  Thank you for choosing [facility] to provide your oncology and hematology care.   To afford each patient quality time with our provider, please arrive at least 15 minutes before your scheduled appointment time. You may need to reschedule your appointment if you arrive late (10 or more minutes). Arriving late affects you and other patients whose appointments are after yours.  Also, if you miss three or more appointments without notifying the office, you may be dismissed from the clinic at the provider's discretion.    Again, thank you for choosing [facility].  Our hope is that these requests will  decrease the amount of time that you wait before being seen by our physicians.   If you have a lab appointment with the Cancer Center - please note that after April 8th, all labs will be drawn in the cancer center.  You do not have to check in or register with the main entrance as you have in the past but will complete your check-in at the cancer center.            _____________________________________________________________  Should you have questions after your visit to [facility], please contact our office at [phone] and follow the prompts.  Our office hours are 8:00 a.m. to 4:30 p.m. Monday - Thursday and 8:00 a.m. to 2:30 p.m. Friday.  Please note that voicemails left after 4:00 p.m. may not be returned until the following business day.  We are closed weekends and all major holidays.  You do have access to a nurse 24-7, just call the main number to the clinic [phone] and do not press any options, hold on the line and a nurse will answer the phone.    For prescription refill requests, have your pharmacy contact our office and allow 72 hours.    Masks are no longer required in the cancer centers. If you would like for your care team to wear a mask while they are taking care of you, please let them know. You may have one support person who is at least 73 years old accompany you for your appointments.

## 2023-12-04 ENCOUNTER — Telehealth: Payer: Self-pay | Admitting: Radiation Oncology

## 2023-12-04 NOTE — Telephone Encounter (Signed)
 I spoke with the patient today.  He is already established with our clinic so he does not need a separate reconsultation appointment as we have been actively involved in the workup of a prevascular lymph node.  He did undergo bronchoscopy on 11/21/2023.  Though multiple nodes were sampled, only 1 came back which was the 6L node showing atypical cells rare large cells suspicious for tumor.  His case has been discussed with pulmonary, and medical oncology.  The patient is a candidate for either ultra hypofractionated radiotherapy to this nodes similar to what was done for his lung earlier this year, versus considering chemoradiation.  After meeting with medical oncology last week, the patient is motivated to try ultra hypofractionated radiation rather than systemic treatments or significantly longer radiotherapy.  We discussed 8-10 fractions would be recommended.  We will cancel the appointment on Wednesday this week again since he is already considered an established patient for this issue.  He we will receive a call from our simulation staff to coordinate a time to come in to do the planning needed for radiotherapy at which time he will sign written consent to proceed.  I offered to call his sister who has also been active in his care and he states he will share this information with her.    Donald KYM Husband, PAC

## 2023-12-06 ENCOUNTER — Ambulatory Visit: Admitting: Radiation Oncology

## 2023-12-06 ENCOUNTER — Ambulatory Visit

## 2023-12-07 ENCOUNTER — Ambulatory Visit (HOSPITAL_COMMUNITY)
Admission: RE | Admit: 2023-12-07 | Discharge: 2023-12-07 | Disposition: A | Discharge status: Home / Self Care | Source: Ambulatory Visit | Attending: Acute Care | Admitting: Acute Care

## 2023-12-07 DIAGNOSIS — C3490 Malignant neoplasm of unspecified part of unspecified bronchus or lung: Secondary | ICD-10-CM | POA: Diagnosis not present

## 2023-12-07 MED ORDER — GADOBUTROL 1 MMOL/ML IV SOLN
6.0000 mL | Freq: Once | INTRAVENOUS | Status: AC | PRN
  Administered 2023-12-07: 6 mL via INTRAVENOUS

## 2023-12-20 DIAGNOSIS — C3412 Malignant neoplasm of upper lobe, left bronchus or lung: Secondary | ICD-10-CM

## 2023-12-21 ENCOUNTER — Ambulatory Visit
Admission: RE | Admit: 2023-12-21 | Discharge: 2023-12-21 | Disposition: A | Discharge status: Home / Self Care | Source: Ambulatory Visit | Attending: Radiation Oncology | Admitting: Radiation Oncology

## 2023-12-21 ENCOUNTER — Ambulatory Visit
Admission: RE | Admit: 2023-12-21 | Discharge: 2023-12-21 | Disposition: A | Source: Ambulatory Visit | Attending: Radiation Oncology

## 2023-12-21 VITALS — BP 114/73 | HR 89 | Temp 97.8°F | Resp 20 | Ht 70.0 in | Wt 130.8 lb

## 2023-12-21 DIAGNOSIS — C3412 Malignant neoplasm of upper lobe, left bronchus or lung: Secondary | ICD-10-CM | POA: Insufficient documentation

## 2023-12-21 DIAGNOSIS — Z79899 Other long term (current) drug therapy: Secondary | ICD-10-CM | POA: Insufficient documentation

## 2023-12-21 LAB — CMP (CANCER CENTER ONLY)
ALT: 17 U/L (ref 0–44)
AST: 24 U/L (ref 15–41)
Albumin: 4.1 g/dL (ref 3.5–5.0)
Alkaline Phosphatase: 72 U/L (ref 38–126)
Anion gap: 8 (ref 5–15)
BUN: 9 mg/dL (ref 8–23)
CO2: 26 mmol/L (ref 22–32)
Calcium: 9.5 mg/dL (ref 8.9–10.3)
Chloride: 105 mmol/L (ref 98–111)
Creatinine: 0.78 mg/dL (ref 0.61–1.24)
GFR, Estimated: >60 mL/min (ref >60)
Glucose, Bld: 78 mg/dL (ref 70–99)
Potassium: 4.4 mmol/L (ref 3.5–5.1)
Sodium: 140 mmol/L (ref 135–145)
Total Bilirubin: 0.3 mg/dL (ref 0.0–1.2)
Total Protein: 7.2 g/dL (ref 6.5–8.1)

## 2023-12-21 MED ORDER — OXYCODONE HCL 5 MG PO TABS
ORAL_TABLET | ORAL | Status: AC
  Filled 2023-12-21: qty 1

## 2023-12-21 MED ORDER — OXYCODONE HCL 5 MG PO TABS
2.5000 mg | ORAL_TABLET | Freq: Once | ORAL | Status: AC
  Administered 2023-12-21: 2.5 mg via ORAL

## 2023-12-21 NOTE — Progress Notes (Incomplete)
 Has armband been applied?  Yes.    Does patient have an allergy to IV contrast dye?: No.   Has patient ever received premedication for IV contrast dye?: n/a   Does patient take metformin ?: No  If patient does take metformin  when was the last dose: n/a  Date of lab work: 12/21/2023 BUN: 9 CR: 0.78 eGfr: >60  IV site: Left Forearm  Has IV site been added to flowsheet?  {yes [id number]}

## 2023-12-22 ENCOUNTER — Telehealth: Payer: Self-pay

## 2023-12-22 NOTE — Telephone Encounter (Signed)
 Hydrocodone -Acetaminophen  7.5/325 MG  Qty 28 Tablets  Take 1 tablet by mouth every 6 (six) hours as needed for moderate pain (pain score 4-6).   PATIENT USES [facility] ON [address]

## 2023-12-26 DIAGNOSIS — M546 Pain in thoracic spine: Secondary | ICD-10-CM | POA: Diagnosis not present

## 2023-12-26 DIAGNOSIS — M542 Cervicalgia: Secondary | ICD-10-CM | POA: Diagnosis not present

## 2023-12-26 DIAGNOSIS — M25512 Pain in left shoulder: Secondary | ICD-10-CM | POA: Diagnosis not present

## 2023-12-26 DIAGNOSIS — M25511 Pain in right shoulder: Secondary | ICD-10-CM | POA: Diagnosis not present

## 2024-01-01 DIAGNOSIS — C3412 Malignant neoplasm of upper lobe, left bronchus or lung: Secondary | ICD-10-CM | POA: Diagnosis not present

## 2024-01-02 ENCOUNTER — Other Ambulatory Visit: Payer: Self-pay

## 2024-01-02 ENCOUNTER — Ambulatory Visit
Admission: RE | Admit: 2024-01-02 | Discharge: 2024-01-02 | Disposition: A | Source: Ambulatory Visit | Attending: Radiation Oncology | Admitting: Radiation Oncology

## 2024-01-02 DIAGNOSIS — C3412 Malignant neoplasm of upper lobe, left bronchus or lung: Secondary | ICD-10-CM | POA: Diagnosis not present

## 2024-01-02 LAB — RAD ONC ARIA SESSION SUMMARY
Course Elapsed Days: 0
Plan Fractions Treated to Date: 1
Plan Prescribed Dose Per Fraction: 6 Gy
Plan Total Fractions Prescribed: 10
Plan Total Prescribed Dose: 60 Gy
Reference Point Dosage Given to Date: 6 Gy
Reference Point Session Dosage Given: 6 Gy
Session Number: 1

## 2024-01-03 ENCOUNTER — Ambulatory Visit: Admission: RE | Admit: 2024-01-03 | Discharge: 2024-01-03 | Attending: Radiation Oncology

## 2024-01-03 ENCOUNTER — Other Ambulatory Visit: Payer: Self-pay

## 2024-01-03 DIAGNOSIS — C3412 Malignant neoplasm of upper lobe, left bronchus or lung: Secondary | ICD-10-CM | POA: Diagnosis not present

## 2024-01-03 LAB — RAD ONC ARIA SESSION SUMMARY
Course Elapsed Days: 1
Plan Fractions Treated to Date: 2
Plan Prescribed Dose Per Fraction: 6 Gy
Plan Total Fractions Prescribed: 10
Plan Total Prescribed Dose: 60 Gy
Reference Point Dosage Given to Date: 12 Gy
Reference Point Session Dosage Given: 6 Gy
Session Number: 2

## 2024-01-04 ENCOUNTER — Other Ambulatory Visit: Payer: Self-pay

## 2024-01-04 ENCOUNTER — Ambulatory Visit: Admission: RE | Admit: 2024-01-04 | Discharge: 2024-01-04 | Attending: Radiation Oncology

## 2024-01-04 DIAGNOSIS — C3412 Malignant neoplasm of upper lobe, left bronchus or lung: Secondary | ICD-10-CM | POA: Diagnosis not present

## 2024-01-04 LAB — RAD ONC ARIA SESSION SUMMARY
Course Elapsed Days: 2
Plan Fractions Treated to Date: 3
Plan Prescribed Dose Per Fraction: 6 Gy
Plan Total Fractions Prescribed: 10
Plan Total Prescribed Dose: 60 Gy
Reference Point Dosage Given to Date: 18 Gy
Reference Point Session Dosage Given: 6 Gy
Session Number: 3

## 2024-01-05 ENCOUNTER — Ambulatory Visit: Admission: RE | Admit: 2024-01-05 | Discharge: 2024-01-05 | Attending: Radiation Oncology

## 2024-01-05 DIAGNOSIS — C3412 Malignant neoplasm of upper lobe, left bronchus or lung: Secondary | ICD-10-CM | POA: Diagnosis not present

## 2024-01-05 LAB — RAD ONC ARIA SESSION SUMMARY
Course Elapsed Days: 3
Plan Fractions Treated to Date: 4
Plan Prescribed Dose Per Fraction: 6 Gy
Plan Total Fractions Prescribed: 10
Plan Total Prescribed Dose: 60 Gy
Reference Point Dosage Given to Date: 24 Gy
Reference Point Session Dosage Given: 6 Gy
Session Number: 4

## 2024-01-08 ENCOUNTER — Other Ambulatory Visit: Payer: Self-pay

## 2024-01-08 ENCOUNTER — Ambulatory Visit: Admission: RE | Admit: 2024-01-08 | Discharge: 2024-01-08 | Attending: Radiation Oncology

## 2024-01-08 DIAGNOSIS — C3412 Malignant neoplasm of upper lobe, left bronchus or lung: Secondary | ICD-10-CM | POA: Diagnosis not present

## 2024-01-08 LAB — RAD ONC ARIA SESSION SUMMARY
Course Elapsed Days: 6
Plan Fractions Treated to Date: 5
Plan Prescribed Dose Per Fraction: 6 Gy
Plan Total Fractions Prescribed: 10
Plan Total Prescribed Dose: 60 Gy
Reference Point Dosage Given to Date: 30 Gy
Reference Point Session Dosage Given: 6 Gy
Session Number: 5

## 2024-01-08 NOTE — Telephone Encounter (Signed)
 Dr. Onesimo pt - Patient lvm today at 4:39pm stating he wants to check on his refil request.  [phone]

## 2024-01-09 ENCOUNTER — Ambulatory Visit
Admission: RE | Admit: 2024-01-09 | Discharge: 2024-01-09 | Disposition: A | Discharge status: Home / Self Care | Source: Ambulatory Visit | Attending: Radiation Oncology | Admitting: Radiation Oncology

## 2024-01-09 ENCOUNTER — Other Ambulatory Visit: Payer: Self-pay

## 2024-01-09 DIAGNOSIS — C3412 Malignant neoplasm of upper lobe, left bronchus or lung: Secondary | ICD-10-CM | POA: Diagnosis not present

## 2024-01-09 LAB — RAD ONC ARIA SESSION SUMMARY
Course Elapsed Days: 7
Plan Fractions Treated to Date: 6
Plan Prescribed Dose Per Fraction: 6 Gy
Plan Total Fractions Prescribed: 10
Plan Total Prescribed Dose: 60 Gy
Reference Point Dosage Given to Date: 36 Gy
Reference Point Session Dosage Given: 6 Gy
Session Number: 6

## 2024-01-09 NOTE — Telephone Encounter (Signed)
 [facility] [employer]  [phone]  He was referred there in October Dr Bertina will no longer give his meds. I have left message for him to call back so we can advise

## 2024-01-10 ENCOUNTER — Other Ambulatory Visit: Payer: Self-pay

## 2024-01-10 ENCOUNTER — Ambulatory Visit
Admission: RE | Admit: 2024-01-10 | Discharge: 2024-01-10 | Disposition: A | Source: Ambulatory Visit | Attending: Radiation Oncology

## 2024-01-10 DIAGNOSIS — C3412 Malignant neoplasm of upper lobe, left bronchus or lung: Secondary | ICD-10-CM | POA: Diagnosis not present

## 2024-01-10 LAB — RAD ONC ARIA SESSION SUMMARY
Course Elapsed Days: 8
Plan Fractions Treated to Date: 7
Plan Prescribed Dose Per Fraction: 6 Gy
Plan Total Fractions Prescribed: 10
Plan Total Prescribed Dose: 60 Gy
Reference Point Dosage Given to Date: 42 Gy
Reference Point Session Dosage Given: 6 Gy
Session Number: 7

## 2024-01-15 ENCOUNTER — Ambulatory Visit
Admission: RE | Admit: 2024-01-15 | Discharge: 2024-01-15 | Disposition: A | Discharge status: Home / Self Care | Source: Ambulatory Visit | Attending: Radiation Oncology | Admitting: Radiation Oncology

## 2024-01-15 ENCOUNTER — Other Ambulatory Visit: Payer: Self-pay

## 2024-01-15 DIAGNOSIS — C3412 Malignant neoplasm of upper lobe, left bronchus or lung: Secondary | ICD-10-CM | POA: Diagnosis not present

## 2024-01-15 LAB — RAD ONC ARIA SESSION SUMMARY
Course Elapsed Days: 13
Plan Fractions Treated to Date: 8
Plan Prescribed Dose Per Fraction: 6 Gy
Plan Total Fractions Prescribed: 10
Plan Total Prescribed Dose: 60 Gy
Reference Point Dosage Given to Date: 48 Gy
Reference Point Session Dosage Given: 6 Gy
Session Number: 8

## 2024-01-16 ENCOUNTER — Other Ambulatory Visit: Payer: Self-pay

## 2024-01-16 ENCOUNTER — Ambulatory Visit
Admission: RE | Admit: 2024-01-16 | Discharge: 2024-01-16 | Disposition: A | Discharge status: Home / Self Care | Source: Ambulatory Visit | Attending: Radiation Oncology | Admitting: Radiation Oncology

## 2024-01-16 DIAGNOSIS — C3412 Malignant neoplasm of upper lobe, left bronchus or lung: Secondary | ICD-10-CM | POA: Diagnosis not present

## 2024-01-16 LAB — RAD ONC ARIA SESSION SUMMARY
Course Elapsed Days: 14
Plan Fractions Treated to Date: 9
Plan Prescribed Dose Per Fraction: 6 Gy
Plan Total Fractions Prescribed: 10
Plan Total Prescribed Dose: 60 Gy
Reference Point Dosage Given to Date: 54 Gy
Reference Point Session Dosage Given: 6 Gy
Session Number: 9

## 2024-01-17 ENCOUNTER — Ambulatory Visit
Admission: RE | Admit: 2024-01-17 | Discharge: 2024-01-17 | Disposition: A | Discharge status: Home / Self Care | Source: Ambulatory Visit | Attending: Radiation Oncology | Admitting: Radiation Oncology

## 2024-01-17 ENCOUNTER — Other Ambulatory Visit: Payer: Self-pay

## 2024-01-17 ENCOUNTER — Encounter: Payer: Self-pay | Admitting: Radiation Oncology

## 2024-01-17 DIAGNOSIS — C3412 Malignant neoplasm of upper lobe, left bronchus or lung: Secondary | ICD-10-CM | POA: Diagnosis not present

## 2024-01-17 LAB — RAD ONC ARIA SESSION SUMMARY
Course Elapsed Days: 15
Plan Fractions Treated to Date: 10
Plan Prescribed Dose Per Fraction: 6 Gy
Plan Total Fractions Prescribed: 10
Plan Total Prescribed Dose: 60 Gy
Reference Point Dosage Given to Date: 60 Gy
Reference Point Session Dosage Given: 6 Gy
Session Number: 10

## 2024-01-18 NOTE — Radiation Completion Notes (Signed)
 Patient Name: [name], [name] MRN: [id number] Date of Birth: 04-25-1950 Referring Physician: LAMAR CHRIS, M.D. Date of Service: 2024-01-18 Radiation Oncologist: Norleen Limes, M.D. [facility]                             RADIATION ONCOLOGY END OF TREATMENT NOTE     Diagnosis: C34.12 Malignant neoplasm of upper lobe, left bronchus or lung Staging on 2023-11-30: Malignant neoplasm of upper lobe of left lung (HCC) T=cTX, N=cN1, M=cM0 Intent: Curative     ==========DELIVERED PLANS==========  First Treatment Date: 2024-01-02 Last Treatment Date: 2024-01-17   Plan Name: Lung_L_UHRT Site: Lung, Left Technique: IMRT Mode: Photon Dose Per Fraction: 6 Gy Prescribed Dose (Delivered / Prescribed): 60 Gy / 60 Gy Prescribed Fxs (Delivered / Prescribed): 10 / 10     ==========ON TREATMENT VISIT DATES========== 2024-01-05, 2024-01-10     ==========UPCOMING VISITS========== 04/17/2024 [facility]  04/17/2024 AP-CT IMAGING CT CHEST W CONTRAST AP-CT 1        ==========APPENDIX - ON TREATMENT VISIT NOTES==========   See weekly On Treatment Notes in Epic for details in the Media tab (listed as Progress notes on the On Treatment Visit Dates listed above).

## 2024-01-22 ENCOUNTER — Telehealth: Payer: Self-pay

## 2024-01-22 NOTE — Telephone Encounter (Signed)
 Patient stopped by the office stating that he went to [facility] and he hasn't heard anything from them. He is complaining about not hearing from them and not being able to get his pain medication.  Please call and advise [phone]

## 2024-01-24 NOTE — Telephone Encounter (Signed)
 Attempted to call pt VM not set up. Pt referral was placed back in Oct '25. If pt calls back he can call [facility] to set up appointment. Please give pt # listed below.   [facility]  [address] [place], [place] [address] Ph: [phone]

## 2024-02-04 NOTE — Progress Notes (Signed)
" °  Radiation Oncology         [phone] ________________________________  Name: [name] MRN: [id number]  Date: 01/17/2024  DOB: Feb 13, 1950  End of Treatment Note  Diagnosis:  Progressive Stage IIA, cT2bN0M0, NSCLC, Squamous Cell Carcinoma of the LUL with findings suspicious for disease in the 6L node.   Indication for treatment:  Curative       Radiation treatment dates:   01/02/24-01/17/24  Site/dose:    Plan Name: Lung_L_UHRT Site: Nodes of Mediastinum Technique: IMRT Mode: Photon Dose Per Fraction: 6 Gy Prescribed Dose (Delivered / Prescribed): 60 Gy / 60 Gy Prescribed Fxs (Delivered / Prescribed): 10 / 10    Narrative: The patient tolerated radiation treatment relatively well with his only complaint of fatigue at the conclusion of his treatment.  Plan: The patient will receive a call in about one month from the radiation oncology department. He will continue follow up with Dr. Davonna as well.      Donald KYM Husband, PAC  "

## 2024-04-17 ENCOUNTER — Inpatient Hospital Stay

## 2024-04-17 ENCOUNTER — Ambulatory Visit (HOSPITAL_COMMUNITY)

## 2024-04-24 ENCOUNTER — Inpatient Hospital Stay: Admitting: Oncology
